# Patient Record
Sex: Male | Born: 1955
Health system: Southern US, Community
[De-identification: ages and names within clinical notes are randomized; demographics above are authoritative.]

## PROBLEM LIST (undated history)

## (undated) DIAGNOSIS — J189 Pneumonia, unspecified organism: Secondary | ICD-10-CM

## (undated) DIAGNOSIS — M109 Gout, unspecified: Secondary | ICD-10-CM

## (undated) DIAGNOSIS — M1990 Unspecified osteoarthritis, unspecified site: Secondary | ICD-10-CM

## (undated) DIAGNOSIS — D496 Neoplasm of unspecified behavior of brain: Secondary | ICD-10-CM

## (undated) DIAGNOSIS — R0600 Dyspnea, unspecified: Secondary | ICD-10-CM

## (undated) DIAGNOSIS — E119 Type 2 diabetes mellitus without complications: Secondary | ICD-10-CM

## (undated) DIAGNOSIS — I10 Essential (primary) hypertension: Secondary | ICD-10-CM

## (undated) DIAGNOSIS — J449 Chronic obstructive pulmonary disease, unspecified: Secondary | ICD-10-CM

## (undated) HISTORY — DX: Essential (primary) hypertension: I10

---

## 2000-01-27 ENCOUNTER — Emergency Department (HOSPITAL_COMMUNITY): Admission: EM | Admit: 2000-01-27 | Discharge: 2000-01-27 | Payer: Self-pay

## 2007-03-28 ENCOUNTER — Emergency Department (HOSPITAL_COMMUNITY): Admission: EM | Admit: 2007-03-28 | Discharge: 2007-03-28 | Payer: Self-pay | Admitting: Emergency Medicine

## 2009-06-04 ENCOUNTER — Encounter: Admission: RE | Admit: 2009-06-04 | Discharge: 2009-06-04 | Payer: Self-pay | Admitting: Family Medicine

## 2009-06-12 ENCOUNTER — Encounter: Admission: RE | Admit: 2009-06-12 | Discharge: 2009-06-12 | Payer: Self-pay | Admitting: Family Medicine

## 2009-08-21 ENCOUNTER — Ambulatory Visit (HOSPITAL_BASED_OUTPATIENT_CLINIC_OR_DEPARTMENT_OTHER): Admission: RE | Admit: 2009-08-21 | Discharge: 2009-08-21 | Payer: Self-pay | Admitting: Family Medicine

## 2009-08-24 ENCOUNTER — Ambulatory Visit: Payer: Self-pay | Admitting: Internal Medicine

## 2010-01-16 ENCOUNTER — Ambulatory Visit: Payer: Self-pay | Admitting: Psychology

## 2010-06-01 ENCOUNTER — Encounter: Payer: Self-pay | Admitting: Orthopedic Surgery

## 2010-10-03 ENCOUNTER — Ambulatory Visit (HOSPITAL_COMMUNITY)
Admission: RE | Admit: 2010-10-03 | Discharge: 2010-10-03 | Disposition: A | Discharge status: Home / Self Care | Payer: 59 | Source: Ambulatory Visit | Attending: Family Medicine | Admitting: Family Medicine

## 2010-10-03 DIAGNOSIS — R0602 Shortness of breath: Secondary | ICD-10-CM | POA: Insufficient documentation

## 2011-02-17 LAB — CBC
HCT: 46.4
Hemoglobin: 16
MCV: 95.8
RDW: 12.8
WBC: 7.8

## 2011-02-17 LAB — I-STAT 8, (EC8 V) (CONVERTED LAB)
Acid-Base Excess: 3 — ABNORMAL HIGH
BUN: 14
Bicarbonate: 29.4 — ABNORMAL HIGH
Chloride: 106
Glucose, Bld: 100 — ABNORMAL HIGH
HCT: 48
Hemoglobin: 16.3
Operator id: 151321
Potassium: 4.2
Sodium: 136
TCO2: 31
pCO2, Ven: 50.5 — ABNORMAL HIGH
pH, Ven: 7.372 — ABNORMAL HIGH

## 2011-02-17 LAB — DIFFERENTIAL
Basophils Absolute: 0
Basophils Relative: 0
Eosinophils Relative: 2
Monocytes Absolute: 0.8
Neutro Abs: 4.9

## 2011-02-17 LAB — POCT I-STAT CREATININE
Creatinine, Ser: 1
Operator id: 151321

## 2011-02-17 LAB — URINALYSIS, ROUTINE W REFLEX MICROSCOPIC
Bilirubin Urine: NEGATIVE
Glucose, UA: NEGATIVE
Ketones, ur: NEGATIVE
Specific Gravity, Urine: 1.014

## 2015-05-07 ENCOUNTER — Other Ambulatory Visit: Payer: Self-pay | Admitting: Family Medicine

## 2015-05-07 DIAGNOSIS — E291 Testicular hypofunction: Secondary | ICD-10-CM

## 2015-05-14 ENCOUNTER — Other Ambulatory Visit: Payer: Self-pay

## 2017-01-07 ENCOUNTER — Other Ambulatory Visit: Payer: Self-pay | Admitting: Family Medicine

## 2017-01-07 DIAGNOSIS — R945 Abnormal results of liver function studies: Principal | ICD-10-CM

## 2017-01-07 DIAGNOSIS — R7989 Other specified abnormal findings of blood chemistry: Secondary | ICD-10-CM

## 2017-01-19 ENCOUNTER — Ambulatory Visit
Admission: RE | Admit: 2017-01-19 | Discharge: 2017-01-19 | Disposition: A | Discharge status: Home / Self Care | Payer: Medicare Other | Source: Ambulatory Visit | Attending: Family Medicine | Admitting: Family Medicine

## 2017-01-19 DIAGNOSIS — R7989 Other specified abnormal findings of blood chemistry: Secondary | ICD-10-CM

## 2017-01-19 DIAGNOSIS — R945 Abnormal results of liver function studies: Principal | ICD-10-CM

## 2017-08-02 ENCOUNTER — Telehealth: Payer: Self-pay | Admitting: Emergency Medicine

## 2017-08-02 ENCOUNTER — Other Ambulatory Visit: Payer: Self-pay

## 2017-08-02 ENCOUNTER — Other Ambulatory Visit: Payer: Self-pay | Admitting: *Deleted

## 2017-08-02 ENCOUNTER — Ambulatory Visit
Admission: RE | Admit: 2017-08-02 | Discharge: 2017-08-02 | Disposition: A | Discharge status: Home / Self Care | Payer: Self-pay | Source: Ambulatory Visit | Attending: Internal Medicine | Admitting: Internal Medicine

## 2017-08-02 ENCOUNTER — Encounter: Payer: Self-pay | Admitting: Internal Medicine

## 2017-08-02 ENCOUNTER — Inpatient Hospital Stay: Payer: Medicare Other | Attending: Internal Medicine | Admitting: Internal Medicine

## 2017-08-02 VITALS — BP 139/49 | HR 58 | Temp 98.0°F | Resp 24 | Ht 75.0 in | Wt 263.0 lb

## 2017-08-02 DIAGNOSIS — D4989 Neoplasm of unspecified behavior of other specified sites: Secondary | ICD-10-CM

## 2017-08-02 DIAGNOSIS — R0600 Dyspnea, unspecified: Secondary | ICD-10-CM

## 2017-08-02 DIAGNOSIS — I10 Essential (primary) hypertension: Secondary | ICD-10-CM | POA: Insufficient documentation

## 2017-08-02 DIAGNOSIS — R4701 Aphasia: Secondary | ICD-10-CM

## 2017-08-02 DIAGNOSIS — R1084 Generalized abdominal pain: Secondary | ICD-10-CM

## 2017-08-02 DIAGNOSIS — C799 Secondary malignant neoplasm of unspecified site: Secondary | ICD-10-CM

## 2017-08-02 DIAGNOSIS — D496 Neoplasm of unspecified behavior of brain: Secondary | ICD-10-CM

## 2017-08-02 DIAGNOSIS — C8589 Other specified types of non-Hodgkin lymphoma, extranodal and solid organ sites: Secondary | ICD-10-CM | POA: Insufficient documentation

## 2017-08-02 DIAGNOSIS — R0602 Shortness of breath: Secondary | ICD-10-CM

## 2017-08-02 DIAGNOSIS — C801 Malignant (primary) neoplasm, unspecified: Secondary | ICD-10-CM

## 2017-08-02 DIAGNOSIS — K5909 Other constipation: Secondary | ICD-10-CM

## 2017-08-02 HISTORY — DX: Essential (primary) hypertension: I10

## 2017-08-02 MED ORDER — DEXAMETHASONE 4 MG PO TABS: 4.0000 mg | ORAL_TABLET | Freq: Every day | ORAL | 2 refills | Status: DC

## 2017-08-02 NOTE — Telephone Encounter (Signed)
lmtcb x1 for [place].

## 2017-08-02 NOTE — Telephone Encounter (Signed)
Spoke with Joelene Millin. She stated that the patient has a history of COPD and needs to establish with a pulmonary doctor ASAP. Patient was previously being managed by his PCP Dr. Arelia Sneddon, but a more aggressive approach needed to taken, especially since he has a brain tumor.   Rescheduled patient with MW for 08/24/17 at [place] verbalized understanding. She will also let the patient know as well.   Nothing else needed at time of call.

## 2017-08-02 NOTE — Progress Notes (Signed)
[facility] [place], [place] [address] [phone]   New Patient Evaluation  Date of Service: 08/02/17 Patient Name: Tim [name] Patient MRN: [id number] Patient DOB: Aug 11, 1955 Provider: Ventura Sellers, MD  Identifying Statement:  Tim [name] is a 62 y.o. male with multifocal brain tumor who presents for initial consultation and evaluation.    Referring Provider: Leonard Downing, MD [address] [place], [place] [address]  History of Present Illness: The patient's records from the referring physician were obtained and reviewed and the patient interviewed to confirm this HPI.  Tim [name] presented to medical attention this past week after noting 2 weeks of progressive language impairment.  He describes difficulty getting words out and communicating with his family.  Although he feels his comprehension is normal, he has been unable to read or write in the past week.  He also acknowledges clumsiness involving his right hand, difficulty manipulating a pen/pencil and some trouble using utensils.  This is new as of the past week.  He denies gait difficulty or weakness/dragging of the right leg.  He otherwise denies headaches, seizures.  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  He presents today to initiate workup and investigation into this new finding.  Of note, he describes a history of COPD requiring home O2.  However, no formal COPD medications are identified and oxygen is strictly used at night for sleep.    Medications: Current Outpatient Medications on File Prior to Visit  Medication Sig Dispense Refill  . allopurinol (ZYLOPRIM) 300 MG tablet Take 300 mg by mouth daily.    Marland Kitchen atenolol (TENORMIN) 50 MG tablet Take 50 mg by mouth daily.    Marland Kitchen b complex vitamins tablet Take 1 tablet by mouth daily.    Marland Kitchen lisinopril (PRINIVIL,ZESTRIL) 20 MG tablet Take  20 mg by mouth daily.    . Multiple Vitamin (MULTIVITAMIN) tablet Take 1 tablet by mouth daily.     No current facility-administered medications on file prior to visit.     Allergies: Allergies not on file Past Medical History:  Past Medical History:  Diagnosis Date  . Hypertension 08/02/2017   Past Surgical History: none    Social History:  Social History   Socioeconomic History  . Marital status: Legally Separated    Spouse name: Not on file  . Number of children: Not on file  . Years of education: Not on file  . Highest education level: Not on file  Occupational History  . Not on file  Social Needs  . Financial resource strain: Not on file  . Food insecurity:    Worry: Not on file    Inability: Not on file  . Transportation needs:    Medical: Not on file    Non-medical: Not on file  Tobacco Use  . Smoking status: Not on file  Substance and Sexual Activity  . Alcohol use: Not on file  . Drug use: Not on file  . Sexual activity: Not on file  Lifestyle  . Physical activity:    Days per week: Not on file    Minutes per session: Not on file  . Stress: Not on file  Relationships  . Social connections:    Talks on phone: Not on file    Gets together: Not on file    Attends religious service: Not on file    Active member of  club or organization: Not on file    Attends meetings of clubs or organizations: Not on file    Relationship status: Not on file  . Intimate partner violence:    Fear of current or ex partner: Not on file    Emotionally abused: Not on file    Physically abused: Not on file    Forced sexual activity: Not on file  Other Topics Concern  . Not on file  Social History Narrative  . Not on file   Family History: No family history on file.  Review of Systems: Constitutional: Denies fevers, chills or abnormal weight loss Eyes: Denies blurriness of vision Ears, nose, mouth, throat, and face: Denies mucositis or sore throat Respiratory: Denies  cough, dyspnea or wheezes Cardiovascular: Denies palpitation, chest discomfort or lower extremity swelling Gastrointestinal:  Denies nausea, constipation, diarrhea GU: Denies dysuria or incontinence Skin: Denies abnormal skin rashes Neurological: Per HPI Musculoskeletal: Denies joint pain, back or neck discomfort. No decrease in ROM Behavioral/Psych: Denies anxiety, disturbance in thought content, and mood instability  Physical Exam: Vitals:   08/02/17 1011  BP: (!) 139/49  Pulse: (!) 58  Resp: (!) 24  Temp: 98 F (36.7 C)  SpO2: 94%   KPS: 80. General: Alert, cooperative, pleasant, in no acute distress Head: Craniotomy scar noted, dry and intact. EENT: No conjunctival injection or scleral icterus. Oral mucosa moist Lungs: Resp effort normal Cardiac: Regular rate and rhythm Abdomen: Soft, non-distended abdomen Skin: No rashes cyanosis or petechiae. Extremities: No clubbing or edema  Neurologic Exam: Mental Status: Awake, alert, attentive to examiner. Oriented to self and environment. Language fluency is impaired, 2-3 words at a time at most.  Comprehension impaired to two step commands.  Repetition is preserved to medium length phrases.  Unable to read even simple sentences. +Dysgraphia Cranial Nerves: Visual acuity is grossly normal. Visual fields are full. Extra-ocular movements intact. No ptosis. Face is symmetric, tongue midline. Motor: Tone and bulk are normal. Pronator drift in right arm with impairment in distal fine motor movements. Reflexes are symmetric, no pathologic reflexes present. Intact finger to nose bilaterally Sensory: Intact to light touch and temperature Gait: Normal, independent   Labs: I have reviewed the data as listed    Component Value Date/Time   NA 136 03/28/2007 1546   K 4.2 03/28/2007 1546   CL 106 03/28/2007 1546   GLUCOSE 100 (H) 03/28/2007 1546   BUN 14 03/28/2007 1546   CREATININE 1.0 03/28/2007 1546   Lab Results  Component Value  Date   WBC 7.8 03/28/2007   NEUTROABS 4.9 03/28/2007   HGB 16.3 03/28/2007   HCT 48.0 03/28/2007   MCV 95.8 03/28/2007   PLT 166 03/28/2007    Imaging:   (MRI brain with and w/o contrast, external from [facility].  Pending upload to PACS system)  [facility] Clinician Interpretation: I have personally reviewed the CNS images as listed.  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  The pattern of enhancement and restricted diffusion decreases likelihood of more common gliomas.  This was discussed in detail with him and his significant other.    We will order CT chest/abdomen/pelvis for systemic screening in the event of a non-CNS primary lesion.  His case will be reviewed in multi-disciplinary brain tumor board on 08/04/17.  Will recommend referral to neurosurgery for likely brain biopsy.  Depending on pathology, further workup may include PET, ophthalmologic screening, and bone marrow biopsy.    Additionally, formal referral was placed to pulmonology to establish care for unclear respiratory diagnosis requiring HS home O2 (not CPAP).    Given his focality and functional impairment, we recommend he initiate therapy with dexamethasone 81m BID for 5 days, followed by 414mdaily thereafter.  He should call the clinic if symptoms do not improve with steroids.  We appreciate the opportunity to participate in the care of Tim [name]  We spent twenty additional minutes teaching regarding the natural history, biology, and historical experience in the treatment of brain tumors. We then discussed in detail the current recommendations for therapy focusing on the mode of administration,  mechanism of action, anticipated toxicities, and quality of life issues associated with this plan. We also provided teaching sheets for the patient to take home as an additional resource.  All questions were answered. The patient knows to call the clinic with any problems, questions or concerns. No barriers to learning were detected.  The total time spent in the encounter was 60 minutes and more than 50% was on counseling and review of test results   ZaVentura SellersMD Medical Director of Neuro-Oncology [facility] [place]3/25/19 10:01 AM

## 2017-08-02 NOTE — Telephone Encounter (Signed)
Tim [name] is calling back [phone]

## 2017-08-03 ENCOUNTER — Telehealth: Payer: Self-pay

## 2017-08-03 NOTE — Telephone Encounter (Signed)
Per 3/25 no los

## 2017-08-04 ENCOUNTER — Ambulatory Visit (HOSPITAL_COMMUNITY)
Admission: RE | Admit: 2017-08-04 | Discharge: 2017-08-04 | Disposition: A | Discharge status: Home / Self Care | Payer: Medicare Other | Source: Ambulatory Visit | Attending: Internal Medicine | Admitting: Internal Medicine

## 2017-08-04 ENCOUNTER — Ambulatory Visit (HOSPITAL_COMMUNITY): Payer: Medicare Other

## 2017-08-04 ENCOUNTER — Encounter (HOSPITAL_COMMUNITY): Payer: Self-pay

## 2017-08-04 DIAGNOSIS — I700 Atherosclerosis of aorta: Secondary | ICD-10-CM | POA: Insufficient documentation

## 2017-08-04 DIAGNOSIS — K5730 Diverticulosis of large intestine without perforation or abscess without bleeding: Secondary | ICD-10-CM | POA: Diagnosis not present

## 2017-08-04 DIAGNOSIS — R0602 Shortness of breath: Secondary | ICD-10-CM | POA: Diagnosis present

## 2017-08-04 DIAGNOSIS — R1084 Generalized abdominal pain: Secondary | ICD-10-CM

## 2017-08-04 MED ORDER — IOPAMIDOL (ISOVUE-300) INJECTION 61%
100.0000 mL | Freq: Once | INTRAVENOUS | Status: AC | PRN
  Administered 2017-08-04: 100 mL via INTRAVENOUS

## 2017-08-04 MED ORDER — IOPAMIDOL (ISOVUE-300) INJECTION 61%
INTRAVENOUS | Status: AC
  Filled 2017-08-04: qty 100

## 2017-08-10 ENCOUNTER — Other Ambulatory Visit (HOSPITAL_COMMUNITY): Payer: Self-pay | Admitting: Neurosurgery

## 2017-08-10 ENCOUNTER — Other Ambulatory Visit: Payer: Self-pay | Admitting: Neurosurgery

## 2017-08-10 DIAGNOSIS — D496 Neoplasm of unspecified behavior of brain: Secondary | ICD-10-CM

## 2017-08-16 ENCOUNTER — Ambulatory Visit (HOSPITAL_COMMUNITY)
Admission: RE | Admit: 2017-08-16 | Discharge: 2017-08-16 | Disposition: A | Discharge status: Home / Self Care | Payer: Medicare Other | Source: Ambulatory Visit | Attending: Neurosurgery | Admitting: Neurosurgery

## 2017-08-16 DIAGNOSIS — D496 Neoplasm of unspecified behavior of brain: Secondary | ICD-10-CM | POA: Diagnosis present

## 2017-08-16 LAB — CREATININE, SERUM
Creatinine, Ser: 0.98 mg/dL (ref 0.61–1.24)
GFR calc Af Amer: >60 mL/min (ref >60)
GFR calc non Af Amer: >60 mL/min (ref >60)

## 2017-08-16 MED ORDER — GADOBENATE DIMEGLUMINE 529 MG/ML IV SOLN
20.0000 mL | Freq: Once | INTRAVENOUS | Status: AC
  Administered 2017-08-16: 20 mL via INTRAVENOUS

## 2017-08-20 NOTE — Pre-Procedure Instructions (Signed)
Tim [name]  08/20/2017      [facility] ([facility] [place] [address]  ([place]) [place] [address] Phone: [phone] Fax: [phone]    Your procedure is scheduled on Mon. April 22  Report to [facility] Admitting at 1:35 P.M.  Call this number if you have problems the morning of surgery:  [phone]   Remember:  Do not eat food or drink liquids after midnight on Sun. April 21   Take these medicines the morning of surgery with A SIP OF WATER : albuterol inhaler-bring to hospital,allopurinol (zyloprim), atenolol (tenormin),decadron (dexamethasone)             7 days prior to surgery STOP taking any Aspirin(unless otherwise instructed by your surgeon), Aleve, Naproxen, Ibuprofen, Motrin, Advil, Goody's, BC's, all herbal medications, fish oil, and all vitamins   Do not wear jewelry.  Do not wear lotions, powders, or perfumes, or deodorant.  Do not shave 48 hours prior to surgery.  Men may shave face and neck.  Do not bring valuables to the hospital.  [facility] is not responsible for any belongings or valuables.  Contacts, dentures or bridgework may not be worn into surgery.  Leave your suitcase in the car.  After surgery it may be brought to your room.  For patients admitted to the hospital, discharge time will be determined by your treatment team.  Patients discharged the day of surgery will not be allowed to drive home.    Special instructions:  [facility]- Preparing For Surgery  Before surgery, you can play an important role. Because skin is not sterile, your skin needs to be as free of germs as possible. You can reduce the number of germs on your skin by washing with CHG (chlorahexidine gluconate) Soap before surgery.  CHG is an antiseptic cleaner which kills germs and bonds with the skin to continue killing germs even after washing.  Please do not use if you have an allergy to CHG or antibacterial  soaps. If your skin becomes reddened/irritated stop using the CHG.  Do not shave (including legs and underarms) for at least 48 hours prior to first CHG shower. It is OK to shave your face.  Please follow these instructions carefully.   1. Shower the NIGHT BEFORE SURGERY and the MORNING OF SURGERY with CHG.   2. If you chose to wash your hair, wash your hair first as usual with your normal shampoo.  3. After you shampoo, rinse your hair and body thoroughly to remove the shampoo.  4. Use CHG as you would any other liquid soap. You can apply CHG directly to the skin and wash gently with a scrungie or a clean washcloth.   5. Apply the CHG Soap to your body ONLY FROM THE NECK DOWN.  Do not use on open wounds or open sores. Avoid contact with your eyes, ears, mouth and genitals (private parts). Wash Face and genitals (private parts)  with your normal soap.  6. Wash thoroughly, paying special attention to the area where your surgery will be performed.  7. Thoroughly rinse your body with warm water from the neck down.  8. DO NOT shower/wash with your normal soap after using and rinsing off the CHG Soap.  9. Pat yourself dry with a CLEAN TOWEL.  10. Wear CLEAN PAJAMAS to bed the night before surgery, wear comfortable clothes the morning of surgery  11. Place CLEAN SHEETS on your  bed the night of your first shower and DO NOT SLEEP WITH PETS.    Day of Surgery: Do not apply any deodorants/lotions. Please wear clean clothes to the hospital/surgery center.      Please read over the following fact sheets that you were given. Coughing and Deep Breathing and Surgical Site Infection Prevention

## 2017-08-23 ENCOUNTER — Encounter (HOSPITAL_COMMUNITY)
Admission: RE | Admit: 2017-08-23 | Discharge: 2017-08-23 | Disposition: A | Discharge status: Home / Self Care | Payer: Medicare Other | Source: Ambulatory Visit | Attending: Neurosurgery | Admitting: Neurosurgery

## 2017-08-23 ENCOUNTER — Other Ambulatory Visit: Payer: Self-pay

## 2017-08-23 ENCOUNTER — Encounter (HOSPITAL_COMMUNITY): Payer: Self-pay

## 2017-08-23 DIAGNOSIS — I10 Essential (primary) hypertension: Secondary | ICD-10-CM | POA: Insufficient documentation

## 2017-08-23 DIAGNOSIS — E119 Type 2 diabetes mellitus without complications: Secondary | ICD-10-CM | POA: Insufficient documentation

## 2017-08-23 DIAGNOSIS — D496 Neoplasm of unspecified behavior of brain: Secondary | ICD-10-CM | POA: Diagnosis not present

## 2017-08-23 DIAGNOSIS — Z01812 Encounter for preprocedural laboratory examination: Secondary | ICD-10-CM | POA: Insufficient documentation

## 2017-08-23 DIAGNOSIS — M109 Gout, unspecified: Secondary | ICD-10-CM | POA: Insufficient documentation

## 2017-08-23 DIAGNOSIS — Z01810 Encounter for preprocedural cardiovascular examination: Secondary | ICD-10-CM | POA: Insufficient documentation

## 2017-08-23 DIAGNOSIS — R001 Bradycardia, unspecified: Secondary | ICD-10-CM | POA: Insufficient documentation

## 2017-08-23 DIAGNOSIS — J449 Chronic obstructive pulmonary disease, unspecified: Secondary | ICD-10-CM | POA: Insufficient documentation

## 2017-08-23 HISTORY — DX: Pneumonia, unspecified organism: J18.9

## 2017-08-23 HISTORY — DX: Gout, unspecified: M10.9

## 2017-08-23 HISTORY — DX: Dyspnea, unspecified: R06.00

## 2017-08-23 HISTORY — DX: Unspecified osteoarthritis, unspecified site: M19.90

## 2017-08-23 HISTORY — DX: Chronic obstructive pulmonary disease, unspecified: J44.9

## 2017-08-23 HISTORY — DX: Type 2 diabetes mellitus without complications: E11.9

## 2017-08-23 HISTORY — DX: Neoplasm of unspecified behavior of brain: D49.6

## 2017-08-23 LAB — CBC
HEMATOCRIT: 45.5 % (ref 39.0–52.0)
Hemoglobin: 16.1 g/dL (ref 13.0–17.0)
MCH: 32.4 pg (ref 26.0–34.0)
MCHC: 35.4 g/dL (ref 30.0–36.0)
MCV: 91.5 fL (ref 78.0–100.0)
PLATELETS: 140 10*3/uL — AB (ref 150–400)
RBC: 4.97 MIL/uL (ref 4.22–5.81)
RDW: 12.5 % (ref 11.5–15.5)
WBC: 17.8 10*3/uL — ABNORMAL HIGH (ref 4.0–10.5)

## 2017-08-23 LAB — BASIC METABOLIC PANEL
Anion gap: 9 (ref 5–15)
BUN: 14 mg/dL (ref 6–20)
CALCIUM: 8.7 mg/dL — AB (ref 8.9–10.3)
CO2: 26 mmol/L (ref 22–32)
CREATININE: 0.72 mg/dL (ref 0.61–1.24)
Chloride: 89 mmol/L — ABNORMAL LOW (ref 101–111)
Glucose, Bld: 137 mg/dL — ABNORMAL HIGH (ref 65–99)
Potassium: 4.2 mmol/L (ref 3.5–5.1)
SODIUM: 124 mmol/L — AB (ref 135–145)

## 2017-08-23 LAB — TYPE AND SCREEN
ABO/RH(D): O POS
ANTIBODY SCREEN: NEGATIVE

## 2017-08-23 LAB — ABO/RH: ABO/RH(D): O POS

## 2017-08-23 LAB — HEMOGLOBIN A1C
Hgb A1c MFr Bld: 7.1 % — ABNORMAL HIGH (ref 4.8–5.6)
Mean Plasma Glucose: 157.07 mg/dL

## 2017-08-23 LAB — GLUCOSE, CAPILLARY: GLUCOSE-CAPILLARY: 166 mg/dL — AB (ref 65–99)

## 2017-08-23 NOTE — Progress Notes (Signed)
PCP - Dr. Arelia Sneddon Cardiologist - patient denies  Chest x-ray - n/a EKG - 08/23/2017 Stress Test - patient denies ECHO - patient denies Cardiac Cath - patient denies  Sleep Study - 2011, in Epic under Media tab; patient wears supplemental O2 at night   Fasting Blood Sugar - ? Checks Blood Sugar 0 times a day Patient was recently taken off DM medications and is diet controlled.  Anesthesia review: n/a  Patient denies shortness of breath, fever, cough and chest pain at PAT appointment   Patient verbalized understanding of instructions that were given to them at the PAT appointment. Patient was also instructed that they will need to review over the PAT instructions again at home before surgery.

## 2017-08-23 NOTE — Progress Notes (Signed)
   08/23/17 1433  OBSTRUCTIVE SLEEP APNEA  Have you ever been diagnosed with sleep apnea through a sleep study? No (negative for sleep apnea but does have low O2 during sleep and wears supplemental O2 at night to bed)  Do you snore loudly (loud enough to be heard through closed doors)?  1  Do you often feel tired, fatigued, or sleepy during the daytime (such as falling asleep during driving or talking to someone)? 0  Has anyone observed you stop breathing during your sleep? 0  Do you have, or are you being treated for high blood pressure? 1  BMI more than 35 kg/m2? 0  Age > 50 (1-yes) 1  Neck circumference greater than:Male 16 inches or larger, Male 17inches or larger? 1  Male Gender (Yes=1) 1  Obstructive Sleep Apnea Score 5  Score 5 or greater  Results sent to PCP

## 2017-08-24 ENCOUNTER — Ambulatory Visit (INDEPENDENT_AMBULATORY_CARE_PROVIDER_SITE_OTHER): Payer: Medicare Other | Admitting: Internal Medicine

## 2017-08-24 ENCOUNTER — Encounter: Payer: Self-pay | Admitting: Internal Medicine

## 2017-08-24 VITALS — BP 140/82 | HR 68 | Ht 75.0 in | Wt 258.8 lb

## 2017-08-24 DIAGNOSIS — I10 Essential (primary) hypertension: Secondary | ICD-10-CM

## 2017-08-24 DIAGNOSIS — J449 Chronic obstructive pulmonary disease, unspecified: Secondary | ICD-10-CM

## 2017-08-24 DIAGNOSIS — R0609 Other forms of dyspnea: Secondary | ICD-10-CM | POA: Diagnosis not present

## 2017-08-24 MED ORDER — VALSARTAN 160 MG PO TABS: 160.0000 mg | ORAL_TABLET | Freq: Every day | ORAL | 11 refills | Status: DC

## 2017-08-24 MED ORDER — PANTOPRAZOLE SODIUM 40 MG PO TBEC: 40.0000 mg | DELAYED_RELEASE_TABLET | Freq: Every day | ORAL | 2 refills | Status: DC

## 2017-08-24 MED ORDER — FAMOTIDINE 20 MG PO TABS: ORAL_TABLET | ORAL | 11 refills | Status: DC

## 2017-08-24 NOTE — Progress Notes (Signed)
Subjective:     Patient ID: Tim [name], male   DOB: [birth year],     MRN: [id number]  HPI  41 yowm quit smoking 2013  With onset 2011 cough / shortness of breath placed on 02 2011 at hs (with reported  neg sleep study) and better p quit smoking > on just as needed saba and worse cough/sob again since March 2019 assoc hoarseness and aphasia > CT/ MRI ? Lymphoma and needs brain bx August 30 2017  So referred to pulmonary clinic 08/24/2017 by Dr   Mickeal Skinner   08/24/2017 1st [facility] office visit/ Mikell Camp   Chief Complaint  Patient presents with  . Pulm Consult    Referred by Dr. Mickeal Skinner for surgical clearance for brain tumor operation. Former smoker, quit 7 years ago. Was a smoker for 44 years. Was diagnosed with severe airways restriction. Uses  2L of O2.    doe :   p 10 min slow pace / steps also poorly tol due to sob and does one flight and stops at top now  Sleep on  side either one  No excess mucus   No better with saba    No obvious day to day or daytime variability or assoc excess/ purulent sputum or mucus plugs or hemoptysis or cp or chest tightness, subjective wheeze or overt sinus or hb symptoms. No unusual exposure hx or h/o childhood pna/ asthma or knowledge of premature birth.  Sleeping  On side  without nocturnal  or early am exacerbation  of respiratory  c/o's or need for noct saba. Also denies any obvious fluctuation of symptoms with weather or environmental changes or other aggravating or alleviating factors except as outlined above   Current Allergies, Complete Past Medical History, Past Surgical History, Family History, and Social History were reviewed in [employer] record.  ROS  The following are not active complaints unless bolded Hoarseness, sore throat, dysphagia, dental problems, itching, sneezing,  nasal congestion or discharge of excess mucus or purulent secretions, ear ache,   fever, chills, sweats, unintended wt loss or wt gain,  classically pleuritic or exertional cp,  orthopnea pnd or arm/hand swelling  or leg swelling, presyncope, palpitations, abdominal pain, anorexia, nausea, vomiting, diarrhea  or change in bowel habits or change in bladder habits, change in stools or change in urine, dysuria, hematuria,  rash, arthralgias, visual complaints, headache, numbness, weakness or ataxia or problems with walking or coordination,  change in mood or  memory.        Current Meds  Medication Sig  . albuterol (PROVENTIL HFA;VENTOLIN HFA) 108 (90 Base) MCG/ACT inhaler Inhale 2 puffs into the lungs every 4 (four) hours as needed for wheezing or shortness of breath.  . allopurinol (ZYLOPRIM) 300 MG tablet Take 300 mg by mouth daily.  Marland Kitchen atenolol (TENORMIN) 100 MG tablet Take 50 mg by mouth daily.  Marland Kitchen b complex vitamins tablet Take 1 tablet by mouth daily.  Marland Kitchen dexamethasone (DECADRON) 4 MG tablet Take 1 tablet (4 mg total) by mouth daily.  Marland Kitchen ibuprofen (ADVIL,MOTRIN) 200 MG tablet Take 800 mg by mouth daily as needed for moderate pain.  Marland Kitchen lisinopril (PRINIVIL,ZESTRIL) 20 MG tablet Take 10 mg by mouth daily.   . Multiple Vitamin (MULTIVITAMIN) tablet Take 1 tablet by mouth daily.             Review of Systems     Objective:   Physical Exam    Very hoarse wm nad   Wt  Readings from Last 3 Encounters:  08/24/17 258 lb 12.8 oz (117.4 kg)  08/23/17 261 lb 4.8 oz (118.5 kg)  08/02/17 263 lb (119.3 kg)     Vital signs reviewed - Note on arrival 02 sats  94% on RA      HEENT: nl dentition, turbinates bilaterally, - mild thrush  oropharynx. Nl external ear canals without cough reflex   NECK :  without JVD/Nodes/TM/ nl carotid upstrokes bilaterally   LUNGS: no acc muscle use,  Nl contour chest  With prominent pseudowheezing without cough on insp or exp maneuvers   CV:  RRR  no s3 or murmur or increase in P2, and trace bilateral sym pitting ankle edema   ABD:  Obese soft and nontender with limited inspiratory  excursion in the supine position. No bruits or organomegaly appreciated, bowel sounds nl  MS:  Nl gait/ ext warm without deformities, calf tenderness, cyanosis or clubbing No obvious joint restrictions   SKIN: warm and dry without lesions    NEURO:  alert, approp, nl sensorium with  no motor or cerebellar deficits apparent.      I personally reviewed images and agree with radiology impression as follows:   Chest CT w contrast 08/04/17 Cardiovascular: No significant vascular findings. Normal heart size. No pericardial effusion. Mediastinum/Nodes: No enlarged mediastinal, hilar, or axillary lymph nodes. Thyroid gland, trachea, and esophagus demonstrate no significant findings. Lungs/Pleura: Lungs are clear. No pleural effusion or pneumothorax.        Assessment:

## 2017-08-24 NOTE — Patient Instructions (Addendum)
Stop lisinopril immediately   Start diovan 160 mg one daily in place of lisinopril and call if problems acquiring it  Pantoprazole (protonix) 40 mg   Take  30-60 min before first meal of the day and Pepcid (famotidine)  20 mg one @  bedtime until return to office - this is the best way to tell whether stomach acid is contributing to your problem.    GERD (REFLUX)  is an extremely common cause of respiratory symptoms just like yours , many times with no obvious heartburn at all.    It can be treated with medication, but also with lifestyle changes including elevation of the head of your bed (ideally with 6 inch  bed blocks),  Smoking cessation, avoidance of late meals, excessive alcohol, and avoid fatty foods, chocolate, peppermint, colas, red wine, and acidic juices such as orange juice.  NO MINT OR MENTHOL PRODUCTS SO NO COUGH DROPS   USE SUGARLESS CANDY INSTEAD (Jolley ranchers or Stover's or Life Savers) or even ice chips will also do - the key is to swallow to prevent all throat clearing. NO OIL BASED VITAMINS - use powdered substitutes.    You are cleared for surgery    Please schedule a follow up office visit in 4 weeks, sooner if needed with full  pft's on return

## 2017-08-24 NOTE — Progress Notes (Signed)
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  WBC elevated, likely due to dexamethasone. Sodium/chloride low; Dr. Vertell Limber spoke with Dr. Lissa Hoard about hyponatremia.  Dr. Vertell Limber asks that pt be brought in to holding day of surgery 1-2 hours earlier than usual routine in order for Na to be rechecked and addressed if still low.   VS: BP (!) 153/66   Pulse (!) 55   Temp 36.7 C   Resp (!) 22   Ht 6\' 3"  (1.905 m)   Wt 261 lb 4.8 oz (118.5 kg)   SpO2 95%   BMI 32.66 kg/m    PROVIDERS: - PCP is Claris Gower, MD - Oncologist is Cecil Cobbs, MD  - Saw pulmonologist Christinia Gully, MD on 08/24/17 for pre-op eval. Note documents "Spirometry 08/24/2017  FEV1 1.68 (40%)  Ratio 53 with flattened early portion of f/v loop on acei > d/c'd  And max rec rx for gerd. F/v loop is typical of upper airway obst and ACEi adverse effects at the  top of the usual list of suspects and the only way to rule it out is a trial off > see hbp a/p  - also add max rx for gerd. If not better at f/u ov with pfts and has any change on inspiratory loop  next step may be ent eval to see if has VC paralysis but should not be a problem with surgery"    LABS:  - HbA1c 7.1, glucose 137 - Na 124, Cl 89 - WBC 17.8. 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 - Paucity of enhancement is seen on today's  examination compared to the previous study, likely related to steroid administration. See discussion above.  CT chest, abd, pelvis 08/04/17:  - No acute abnormality seen in the chest, abdomen or pelvis. - Sigmoid diverticulosis without inflammation. - Aortic Atherosclerosis   EKG 08/23/17: Sinus bradycardia (53 bpm)  Past Medical History:  Diagnosis Date  . Arthritis   . Brain tumor ([facility])   . COPD (chronic obstructive pulmonary disease) ([facility])   . Diabetes mellitus without complication [facility])    patient states he was taken off meds at last appointment with PCP and is diet controlled  . Dyspnea   . Gout   . Hypertension 08/02/2017  . Pneumonia     Medications include: Albuterol, atenolol, dexamethasone. Pt was on lisinopril, but this was discontinued 08/24/17 by Dr. Melvyn Novas.   If labs acceptable day of surgery, I anticipate pt can proceed with surgery as scheduled.   Willeen Cass, FNP-BC [facility]/Anesthesiology Phone: [phone] 08/25/2017 2:51 PM

## 2017-08-25 ENCOUNTER — Other Ambulatory Visit: Payer: Self-pay | Admitting: Neurosurgery

## 2017-08-25 ENCOUNTER — Telehealth: Payer: Self-pay | Admitting: Internal Medicine

## 2017-08-25 ENCOUNTER — Encounter: Payer: Self-pay | Admitting: Internal Medicine

## 2017-08-25 DIAGNOSIS — J449 Chronic obstructive pulmonary disease, unspecified: Secondary | ICD-10-CM | POA: Insufficient documentation

## 2017-08-25 NOTE — Assessment & Plan Note (Addendum)
Try off acei 08/24/2017 due to pseudowheeze  In the best review of chronic cough to date ( NEJM 2016 [id number]) ,  ACEi are now felt to cause cough in up to  20% of pts which is a 4 fold increase from previous reports and does not include the variety of non-specific complaints we see in pulmonary clinic in pts on ACEi but previously attributed to another dx like  Copd/asthma and  include PNDS, throat and chest congestion, "bronchitis", unexplained dyspnea and noct "strangling" sensations, and hoarseness, but also  atypical /refractory GERD symptoms like dysphagia and "bad heartburn"   The only way I know  to prove this is not an "ACEi Case" is a trial off ACEi x a minimum of 4-6 weeks then regroup - if not better consider In the setting of respiratory symptoms of unknown etiology,  It would be preferable to use bystolic, the most beta -1  selective Beta blocker available in sample form, with bisoprolol the most selective generic choice  on the market, at least on a trial basis, to make sure the spillover Beta 2 effects of the less specific Beta blockers are not contributing to this patient's symptoms.   Should not be a contraindication to surgery as will have been off a minimum of 3 days preop

## 2017-08-25 NOTE — Assessment & Plan Note (Signed)
Spirometry 08/24/2017  FEV1 1.68 (40%)  Ratio 53 with flattened early portion of f/v loop on acei > d/c'd  And max rec rx for gerd    F/v loop is typical of upper airway obst and ACEi adverse effects at the  top of the usual list of suspects and the only way to rule it out is a trial off > see hbp a/p  - also add max rx for gerd  If not better at f/u ov with pfts and has any change on inspiratory loop  next step may be ent eval to see if has VC paralysis but should not be a problem with surgery

## 2017-08-25 NOTE — Telephone Encounter (Signed)
Routing message to Dr. Melvyn Novas to address this afternoon when he comes in the office.

## 2017-08-25 NOTE — Assessment & Plan Note (Addendum)
  When respiratory symptoms begin or become refractory well after a patient reports complete smoking cessation,  Especially when this wasn't the case while they were smoking, a red flag is raised based on the work of Dr Kris Mouton which states:  if you quit smoking when your best day FEV1 is still well preserved it is highly unlikely you will progress to severe disease.  That is to say, once the smoking stops,  the symptoms should not suddenly erupt or markedly worsen.  If so, the differential diagnosis should include  obesity/deconditioning,  LPR/Reflux/Aspiration syndromes,  occult CHF, or  especially side effect of medications commonly used in this population (high dose beta blockers and ACEi)  so try off acei fist then bring back in 4 weeks for full pfts and consider alternative BB

## 2017-08-25 NOTE — Telephone Encounter (Signed)
Discussed with Dr Vertell Limber > na = 123   rec no hctz/ water restriction and repeat on 09/06/17 pre op

## 2017-08-28 NOTE — H&P (Signed)
Patient ID:   [id number] Patient: Tim [name]  Date of Birth: 1956-04-01 Visit Type: Office Visit   Date: 08/09/2017 03:00 PM Provider: Marchia Meiers. Vertell Limber MD   This 62 year old male presents for abnormal study.  HISTORY OF PRESENT ILLNESS:  1.  abnormal study  Tim [name], 62 year old male, visits for evaluation.  Disabled since 2011 due to issues with concentration, he suffered a loss of speech mid March while vacationing in [place].  He noticed some right arm weakness and right facial droop.  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  Initiation of Decadron twice daily, now once daily, significantly improved all symptoms.  History:  HTN, diet-controlled diabetes, COPD (oxygen at night only)-pulmonary consult scheduled for the 16th Surgical history:  None  MRI on [facility]  This shows multiple enhancing lesions within the left frontal lobe which come to the surface in the pre motor region and which are enhancing.  There also additional lesions in the periventricular region and within the posterior corpus callosum.  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  The patient has noticed problems with getting words out and also says he is unable to speak fluidly and uses the wrong terms at times.  He has gotten confused and frustrated with his lack of ability to get words out.  Since he started on steroids his right arm weakness and left facial droop have improved significantly.          PAST MEDICAL HISTORY, SURGICAL HISTORY, FAMILY HISTORY, SOCIAL HISTORY AND REVIEW OF SYSTEMS I have reviewed the patient's past medical, surgical, family and social history as well as the comprehensive review of systems as included on the [facility] history form dated 08/09/2017, which I have signed.  Family History: Patient reports there is no relevant family history.    MEDICATIONS: (added, continued or stopped this visit) Started  Medication Directions Instruction Stopped   allopurinol 300 mg tablet take 1 tablet by oral route  every day     atenolol 100 mg tablet take 1 tablet by oral route  every day     B-Complex tablet      dexamethasone 4 mg tablet take 1 tablet by oral route  every day     lisinopril 20 mg tablet take 1 tablet by oral route  every day     multivitamin tablet take 1 tablet by oral route  every day     Ventolin HFA 90 mcg/actuation aerosol inhaler inhale 2 puff by inhalation route  every 4 - 6 hours as needed       ALLERGIES: Ingredient Reaction Medication Name Comment  NO KNOWN ALLERGIES     No known allergies.   REVIEW OF SYSTEMS   See scanned patient registration form, dated 08/09/2017, signed and dated on 08/09/2017  Review of Systems Details System Neg/Pos Details  Constitutional Negative Chills, Fatigue, Fever, Malaise, Night sweats, Weight gain and Weight loss.  ENMT Negative Ear drainage, Hearing loss, Nasal drainage, Otalgia, Sinus pressure and Sore throat.  Eyes Negative Eye discharge, Eye pain and Vision changes.  Respiratory Negative Chronic cough, Cough, Dyspnea, Known TB exposure and Wheezing.  Cardio Negative Chest pain, Claudication, Edema and Irregular heartbeat/palpitations.  GI Negative Abdominal pain, Blood in stool, Change in stool pattern, Constipation, Decreased appetite, Diarrhea, Heartburn, Nausea and Vomiting.  GU Negative Dribbling, Dysuria, Erectile dysfunction, Hematuria, Polyuria (Genitourinary), Slow stream, Urinary frequency, Urinary incontinence and Urinary retention.  Endocrine Negative Cold intolerance, Heat intolerance,  Polydipsia and Polyphagia.  Neuro Positive Memory impairment.  Psych Negative Anxiety, Depression and Insomnia.  Integumentary Negative Brittle hair, Brittle nails, Change in shape/size of mole(s), Hair loss, Hirsutism, Hives, Pruritus, Rash and Skin lesion.  MS Negative Back pain, Joint pain, Joint swelling, Muscle weakness and Neck  pain.  Hema/Lymph Negative Easy bleeding, Easy bruising and Lymphadenopathy.  Allergic/Immuno Negative Contact allergy, Environmental allergies, Food allergies and Seasonal allergies.  Reproductive Negative Penile discharge and Sexual dysfunction.   PHYSICAL EXAM:   Vitals Date Temp F BP Pulse Ht In Wt Lb BMI BSA Pain Score  08/09/2017  145/81 54 75 265 33.12  0/10    PHYSICAL EXAM Details General Level of Distress: no acute distress Overall Appearance: normal  Head and Face  Right Left  Fundoscopic Exam:  normal normal    Cardiovascular Cardiac: regular rate and rhythm without murmur  Right Left  Carotid Pulses: normal normal  Respiratory Lungs: clear to auscultation  Neurological Orientation: normal Recent and Remote Memory: normal Attention Span and Concentration:   normal Language: normal Fund of Knowledge: normal  Right Left Sensation: normal normal Upper Extremity Coordination: normal normal  Lower Extremity Coordination: normal normal  Musculoskeletal Gait and Station: normal  Right Left Upper Extremity Muscle Strength: normal normal Lower Extremity Muscle Strength: normal normal Upper Extremity Muscle Tone:  normal normal Lower Extremity Muscle Tone: normal normal   Motor Strength Upper and lower extremity motor strength was tested in the clinically pertinent muscles.     Deep Tendon Reflexes  Right Left Biceps: normal normal Triceps: normal normal Brachioradialis: normal normal Patellar: normal normal Achilles: normal normal  Sensory Sensation was tested at L1 to S1.   Cranial Nerves II. Optic Nerve/Visual Fields: normal III. Oculomotor: normal IV. Trochlear: normal V. Trigeminal: normal VI. Abducens: normal VII. Facial: normal VIII. Acoustic/Vestibular: normal IX. Glossopharyngeal: normal X. Vagus: normal XI. Spinal Accessory: normal XII. Hypoglossal: normal  Motor and other  Tests Lhermittes: negative Rhomberg: negative Pronator drift: absent     Right Left Spurlings negative negative Hoffman's: normal normal Clonus: normal normal Babinski: normal normal SLR: negative negative Patrick's Corky Sox): negative negative Toe Walk: normal normal Toe Lift: normal normal Heel Walk: normal normal Tinels Elbow: negative negative Tinels Wrist: negative negative SI Joint: nontender nontender Phalen: negative negative   Additional Findings:  Minimal right facial droop and no pronator drift of outstretched upper extremities.  The patient has mild word-finding and naming difficulties.  He speaks more slowly than is typical for him.    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  We have recommended brain biopsy for this lesion.  We will perform a BrainLAB MRI preoperatively for left frontal craniotomy and biopsy.  PLAN:  The patient's brother died a few days ago and he is going to attend his brother's funeral.  We are electing to proceed with surgery after he has had a chance to be evaluated by his pulmonologist on April 16th to make sure it is safe for him to undergo general anesthesia and her tentatively planning to proceed with surgery on the 22nd of April.  Orders: Diagnostic Procedures: Assessment Procedure  D49.6 MRI Brain With & W/o Contrast BrainLAB protocol  Instruction(s)/Education: Assessment Instruction  I10 Hypertension education  [phone] Dietary management education, guidance, and counseling   Completed Orders (this encounter) Order Details Reason Side Interpretation Result Initial Treatment Date Region  Hypertension education Patient to follow up with primary care provider.  Dietary management education, guidance, and counseling patient encouraged to eat a well balanced diet         Assessment/Plan   # Detail Type Description   1. Assessment Brain tumor (D49.6).       2. Assessment Aphasia (R47.01).        3. 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       4. Assessment Essential (primary) hypertension (I10).       5. Assessment Body mass index (BMI) 33.0-33.9, adult (I45.80).   Plan Orders Today's instructions / counseling include(s) Dietary management education, guidance, and counseling.         Pain Management Plan Pain Scale: 0/10. Method: Numeric Pain Intensity Scale. Onset: 07/29/2017.              Provider:  Vertell Limber MD, Marchia Meiers 08/12/2017 5:38 PM  Dictation edited by: Marchia Meiers. Vertell Limber    CC Providers: [place], [place] [id number]-              Electronically signed by Marchia Meiers. Vertell Limber MD on 08/12/2017 05:38 PM

## 2017-08-30 ENCOUNTER — Other Ambulatory Visit: Payer: Self-pay

## 2017-08-30 ENCOUNTER — Inpatient Hospital Stay (HOSPITAL_COMMUNITY): Payer: Medicare Other | Admitting: Emergency Medicine

## 2017-08-30 ENCOUNTER — Encounter (HOSPITAL_COMMUNITY): Payer: Self-pay

## 2017-08-30 ENCOUNTER — Inpatient Hospital Stay (HOSPITAL_COMMUNITY): Payer: Medicare Other

## 2017-08-30 ENCOUNTER — Inpatient Hospital Stay (HOSPITAL_COMMUNITY)
Admission: RE | Disposition: A | Discharge status: Home / Self Care | Payer: Self-pay | Source: Ambulatory Visit | Attending: Neurosurgery

## 2017-08-30 ENCOUNTER — Inpatient Hospital Stay (HOSPITAL_COMMUNITY)
Admission: RE | Admit: 2017-08-30 | Discharge: 2017-08-31 | DRG: 821 | Disposition: A | Discharge status: Home / Self Care | Payer: Medicare Other | Source: Ambulatory Visit | Attending: Neurosurgery | Admitting: Neurosurgery

## 2017-08-30 DIAGNOSIS — C8589 Other specified types of non-Hodgkin lymphoma, extranodal and solid organ sites: Secondary | ICD-10-CM | POA: Diagnosis present

## 2017-08-30 DIAGNOSIS — R4701 Aphasia: Secondary | ICD-10-CM | POA: Diagnosis present

## 2017-08-30 DIAGNOSIS — I10 Essential (primary) hypertension: Secondary | ICD-10-CM | POA: Diagnosis present

## 2017-08-30 DIAGNOSIS — Z01818 Encounter for other preprocedural examination: Secondary | ICD-10-CM

## 2017-08-30 DIAGNOSIS — E119 Type 2 diabetes mellitus without complications: Secondary | ICD-10-CM | POA: Diagnosis present

## 2017-08-30 DIAGNOSIS — R0609 Other forms of dyspnea: Secondary | ICD-10-CM | POA: Diagnosis present

## 2017-08-30 DIAGNOSIS — Z79899 Other long term (current) drug therapy: Secondary | ICD-10-CM | POA: Diagnosis not present

## 2017-08-30 DIAGNOSIS — Z9981 Dependence on supplemental oxygen: Secondary | ICD-10-CM | POA: Diagnosis not present

## 2017-08-30 DIAGNOSIS — Z6833 Body mass index (BMI) 33.0-33.9, adult: Secondary | ICD-10-CM

## 2017-08-30 DIAGNOSIS — M1990 Unspecified osteoarthritis, unspecified site: Secondary | ICD-10-CM | POA: Diagnosis present

## 2017-08-30 DIAGNOSIS — R29810 Facial weakness: Secondary | ICD-10-CM | POA: Diagnosis present

## 2017-08-30 DIAGNOSIS — D496 Neoplasm of unspecified behavior of brain: Secondary | ICD-10-CM | POA: Diagnosis present

## 2017-08-30 DIAGNOSIS — Z87891 Personal history of nicotine dependence: Secondary | ICD-10-CM | POA: Diagnosis not present

## 2017-08-30 DIAGNOSIS — J449 Chronic obstructive pulmonary disease, unspecified: Secondary | ICD-10-CM | POA: Diagnosis present

## 2017-08-30 DIAGNOSIS — G8191 Hemiplegia, unspecified affecting right dominant side: Secondary | ICD-10-CM | POA: Diagnosis present

## 2017-08-30 HISTORY — PX: STERIOTACTIC STIMULATOR INSERTION: SHX5374

## 2017-08-30 HISTORY — PX: APPLICATION OF CRANIAL NAVIGATION: SHX6578

## 2017-08-30 LAB — POCT I-STAT 7, (LYTES, BLD GAS, ICA,H+H)
Acid-Base Excess: 4 mmol/L — ABNORMAL HIGH (ref 0.0–2.0)
Bicarbonate: 32.5 mmol/L — ABNORMAL HIGH (ref 20.0–28.0)
CALCIUM ION: 1.17 mmol/L (ref 1.15–1.40)
HEMATOCRIT: 41 % (ref 39.0–52.0)
Hemoglobin: 13.9 g/dL (ref 13.0–17.0)
O2 SAT: 99 %
PH ART: 7.315 — AB (ref 7.350–7.450)
POTASSIUM: 3.9 mmol/L (ref 3.5–5.1)
Sodium: 134 mmol/L — ABNORMAL LOW (ref 135–145)
TCO2: 34 mmol/L — AB (ref 22–32)
pCO2 arterial: 63.8 mmHg — ABNORMAL HIGH (ref 32.0–48.0)
pO2, Arterial: 174 mmHg — ABNORMAL HIGH (ref 83.0–108.0)

## 2017-08-30 LAB — POCT I-STAT 4, (NA,K, GLUC, HGB,HCT)
Glucose, Bld: 155 mg/dL — ABNORMAL HIGH (ref 65–99)
HCT: 46 % (ref 39.0–52.0)
Hemoglobin: 15.6 g/dL (ref 13.0–17.0)
Potassium: 4.1 mmol/L (ref 3.5–5.1)
SODIUM: 134 mmol/L — AB (ref 135–145)

## 2017-08-30 LAB — MRSA PCR SCREENING: MRSA BY PCR: NEGATIVE

## 2017-08-30 LAB — GLUCOSE, CAPILLARY
GLUCOSE-CAPILLARY: 300 mg/dL — AB (ref 65–99)
Glucose-Capillary: 159 mg/dL — ABNORMAL HIGH (ref 65–99)

## 2017-08-30 SURGERY — STERIOTACTIC BIOPSY
Anesthesia: General | Site: Head | Laterality: Left

## 2017-08-30 MED ORDER — PROPOFOL 10 MG/ML IV BOLUS
INTRAVENOUS | Status: DC | PRN
  Administered 2017-08-30: 130 mg via INTRAVENOUS

## 2017-08-30 MED ORDER — ALBUTEROL SULFATE (2.5 MG/3ML) 0.083% IN NEBU: 3.0000 mL | INHALATION_SOLUTION | RESPIRATORY_TRACT | Status: DC | PRN

## 2017-08-30 MED ORDER — ONDANSETRON HCL 4 MG/2ML IJ SOLN
INTRAMUSCULAR | Status: DC | PRN
  Administered 2017-08-30: 4 mg via INTRAVENOUS

## 2017-08-30 MED ORDER — BACITRACIN ZINC 500 UNIT/GM EX OINT
TOPICAL_OINTMENT | CUTANEOUS | Status: AC
  Filled 2017-08-30: qty 28.35

## 2017-08-30 MED ORDER — CEFAZOLIN SODIUM-DEXTROSE 2-4 GM/100ML-% IV SOLN
2.0000 g | Freq: Three times a day (TID) | INTRAVENOUS | Status: AC
  Administered 2017-08-30 – 2017-08-31 (×2): 2 g via INTRAVENOUS
  Filled 2017-08-30 (×2): qty 100

## 2017-08-30 MED ORDER — ACETAMINOPHEN 650 MG RE SUPP: 650.0000 mg | RECTAL | Status: DC | PRN

## 2017-08-30 MED ORDER — DOCUSATE SODIUM 100 MG PO CAPS
100.0000 mg | ORAL_CAPSULE | Freq: Two times a day (BID) | ORAL | Status: DC
Start: 2017-08-30 — End: 2017-08-31
  Administered 2017-08-30 – 2017-08-31 (×2): 100 mg via ORAL
  Filled 2017-08-30 (×2): qty 1

## 2017-08-30 MED ORDER — BUPIVACAINE HCL (PF) 0.5 % IJ SOLN
INTRAMUSCULAR | Status: DC | PRN
  Administered 2017-08-30: 3 mL

## 2017-08-30 MED ORDER — ROCURONIUM BROMIDE 100 MG/10ML IV SOLN
INTRAVENOUS | Status: DC | PRN
  Administered 2017-08-30: 60 mg via INTRAVENOUS

## 2017-08-30 MED ORDER — ADULT MULTIVITAMIN W/MINERALS CH
1.0000 | ORAL_TABLET | Freq: Every day | ORAL | Status: DC
  Administered 2017-08-30 – 2017-08-31 (×2): 1 via ORAL
  Filled 2017-08-30 (×2): qty 1

## 2017-08-30 MED ORDER — PHENYLEPHRINE HCL 10 MG/ML IJ SOLN
INTRAVENOUS | Status: DC | PRN
  Administered 2017-08-30: 20 ug/min via INTRAVENOUS

## 2017-08-30 MED ORDER — SUGAMMADEX SODIUM 200 MG/2ML IV SOLN
INTRAVENOUS | Status: DC | PRN
  Administered 2017-08-30: 250 mg via INTRAVENOUS

## 2017-08-30 MED ORDER — MORPHINE SULFATE (PF) 4 MG/ML IV SOLN: 2.0000 mg | INTRAVENOUS | Status: DC | PRN

## 2017-08-30 MED ORDER — PROPOFOL 10 MG/ML IV BOLUS
INTRAVENOUS | Status: AC
  Filled 2017-08-30: qty 20

## 2017-08-30 MED ORDER — FENTANYL CITRATE (PF) 100 MCG/2ML IJ SOLN
INTRAMUSCULAR | Status: DC | PRN
  Administered 2017-08-30: 100 ug via INTRAVENOUS

## 2017-08-30 MED ORDER — PANTOPRAZOLE SODIUM 40 MG PO TBEC: 40.0000 mg | DELAYED_RELEASE_TABLET | Freq: Every day | ORAL | Status: DC

## 2017-08-30 MED ORDER — ACETAMINOPHEN 500 MG PO TABS: 1000.0000 mg | ORAL_TABLET | ORAL | Status: DC | PRN

## 2017-08-30 MED ORDER — B COMPLEX PO TABS: 1.0000 | ORAL_TABLET | Freq: Every day | ORAL | Status: DC

## 2017-08-30 MED ORDER — ALLOPURINOL 300 MG PO TABS
300.0000 mg | ORAL_TABLET | Freq: Every day | ORAL | Status: DC
  Administered 2017-08-31: 300 mg via ORAL
  Filled 2017-08-30: qty 1

## 2017-08-30 MED ORDER — FLEET ENEMA 7-19 GM/118ML RE ENEM: 1.0000 | ENEMA | Freq: Once | RECTAL | Status: DC | PRN

## 2017-08-30 MED ORDER — ONDANSETRON HCL 4 MG/2ML IJ SOLN: 4.0000 mg | INTRAMUSCULAR | Status: DC | PRN

## 2017-08-30 MED ORDER — FAMOTIDINE IN NACL 20-0.9 MG/50ML-% IV SOLN
20.0000 mg | Freq: Two times a day (BID) | INTRAVENOUS | Status: DC
Start: 2017-08-30 — End: 2017-08-31
  Administered 2017-08-30 – 2017-08-31 (×2): 20 mg via INTRAVENOUS
  Filled 2017-08-30 (×2): qty 50

## 2017-08-30 MED ORDER — LIDOCAINE-EPINEPHRINE 1 %-1:100000 IJ SOLN
INTRAMUSCULAR | Status: AC
  Filled 2017-08-30: qty 1

## 2017-08-30 MED ORDER — CEFAZOLIN SODIUM-DEXTROSE 2-4 GM/100ML-% IV SOLN
2.0000 g | INTRAVENOUS | Status: AC
  Administered 2017-08-30: 2 g via INTRAVENOUS

## 2017-08-30 MED ORDER — PROMETHAZINE HCL 25 MG PO TABS: 12.5000 mg | ORAL_TABLET | ORAL | Status: DC | PRN

## 2017-08-30 MED ORDER — B COMPLEX-C PO TABS
1.0000 | ORAL_TABLET | Freq: Every day | ORAL | Status: DC
  Administered 2017-08-31: 1 via ORAL
  Filled 2017-08-30: qty 1

## 2017-08-30 MED ORDER — FENTANYL CITRATE (PF) 250 MCG/5ML IJ SOLN
INTRAMUSCULAR | Status: AC
  Filled 2017-08-30: qty 5

## 2017-08-30 MED ORDER — HEMOSTATIC AGENTS (NO CHARGE) OPTIME
TOPICAL | Status: DC | PRN
  Administered 2017-08-30: 1 via TOPICAL

## 2017-08-30 MED ORDER — BACITRACIN ZINC 500 UNIT/GM EX OINT
TOPICAL_OINTMENT | CUTANEOUS | Status: DC | PRN
  Administered 2017-08-30 (×2): 1 via TOPICAL

## 2017-08-30 MED ORDER — THROMBIN (RECOMBINANT) 5000 UNITS EX SOLR
OROMUCOSAL | Status: DC | PRN
  Administered 2017-08-30: 16:00:00 via TOPICAL

## 2017-08-30 MED ORDER — ALBUTEROL SULFATE HFA 108 (90 BASE) MCG/ACT IN AERS
INHALATION_SPRAY | RESPIRATORY_TRACT | Status: DC | PRN
  Administered 2017-08-30: 4 via RESPIRATORY_TRACT
  Administered 2017-08-30: 2 via RESPIRATORY_TRACT

## 2017-08-30 MED ORDER — DEXAMETHASONE 4 MG PO TABS
4.0000 mg | ORAL_TABLET | Freq: Every day | ORAL | Status: DC
  Administered 2017-08-30 – 2017-08-31 (×2): 4 mg via ORAL
  Filled 2017-08-30 (×2): qty 1

## 2017-08-30 MED ORDER — THROMBIN 5000 UNITS EX SOLR
CUTANEOUS | Status: AC
  Filled 2017-08-30: qty 15000

## 2017-08-30 MED ORDER — MIDAZOLAM HCL 2 MG/2ML IJ SOLN
INTRAMUSCULAR | Status: AC
  Filled 2017-08-30: qty 2

## 2017-08-30 MED ORDER — LIDOCAINE HCL (CARDIAC) PF 100 MG/5ML IV SOSY
PREFILLED_SYRINGE | INTRAVENOUS | Status: DC | PRN
  Administered 2017-08-30: 60 mg via INTRAVENOUS

## 2017-08-30 MED ORDER — LABETALOL HCL 5 MG/ML IV SOLN
10.0000 mg | INTRAVENOUS | Status: DC | PRN
  Administered 2017-08-30: 10 mg via INTRAVENOUS
  Filled 2017-08-30: qty 4

## 2017-08-30 MED ORDER — 0.9 % SODIUM CHLORIDE (POUR BTL) OPTIME
TOPICAL | Status: DC | PRN
  Administered 2017-08-30 (×3): 1000 mL

## 2017-08-30 MED ORDER — FAMOTIDINE 20 MG PO TABS: 20.0000 mg | ORAL_TABLET | Freq: Two times a day (BID) | ORAL | Status: DC

## 2017-08-30 MED ORDER — ACETAMINOPHEN 325 MG PO TABS
650.0000 mg | ORAL_TABLET | ORAL | Status: DC | PRN
  Filled 2017-08-30: qty 2

## 2017-08-30 MED ORDER — CHLORHEXIDINE GLUCONATE CLOTH 2 % EX PADS: 6.0000 | MEDICATED_PAD | Freq: Once | CUTANEOUS | Status: DC

## 2017-08-30 MED ORDER — DEXAMETHASONE SODIUM PHOSPHATE 4 MG/ML IJ SOLN
INTRAMUSCULAR | Status: DC | PRN
  Administered 2017-08-30: 10 mg via INTRAVENOUS

## 2017-08-30 MED ORDER — MIDAZOLAM HCL 5 MG/5ML IJ SOLN
INTRAMUSCULAR | Status: DC | PRN
  Administered 2017-08-30: 2 mg via INTRAVENOUS

## 2017-08-30 MED ORDER — POTASSIUM CHLORIDE IN NACL 20-0.9 MEQ/L-% IV SOLN
INTRAVENOUS | Status: DC
Start: 2017-08-30 — End: 2017-08-31
  Administered 2017-08-30: 20:00:00 via INTRAVENOUS
  Filled 2017-08-30 (×2): qty 1000

## 2017-08-30 MED ORDER — POLYETHYLENE GLYCOL 3350 17 G PO PACK: 17.0000 g | PACK | Freq: Every day | ORAL | Status: DC | PRN

## 2017-08-30 MED ORDER — HYDROCODONE-ACETAMINOPHEN 5-325 MG PO TABS
1.0000 | ORAL_TABLET | ORAL | Status: DC | PRN
  Administered 2017-08-30 – 2017-08-31 (×4): 1 via ORAL
  Filled 2017-08-30 (×4): qty 1

## 2017-08-30 MED ORDER — FENTANYL CITRATE (PF) 100 MCG/2ML IJ SOLN
25.0000 ug | INTRAMUSCULAR | Status: DC | PRN
  Administered 2017-08-30 (×2): 25 ug via INTRAVENOUS
  Administered 2017-08-30: 50 ug via INTRAVENOUS

## 2017-08-30 MED ORDER — BISACODYL 10 MG RE SUPP: 10.0000 mg | Freq: Every day | RECTAL | Status: DC | PRN

## 2017-08-30 MED ORDER — FENTANYL CITRATE (PF) 100 MCG/2ML IJ SOLN
INTRAMUSCULAR | Status: AC
  Filled 2017-08-30: qty 2

## 2017-08-30 MED ORDER — LIDOCAINE-EPINEPHRINE 1 %-1:100000 IJ SOLN
INTRAMUSCULAR | Status: DC | PRN
  Administered 2017-08-30: 3 mL

## 2017-08-30 MED ORDER — INSULIN ASPART 100 UNIT/ML ~~LOC~~ SOLN
0.0000 [IU] | Freq: Every day | SUBCUTANEOUS | Status: DC
  Administered 2017-08-30: 3 [IU] via SUBCUTANEOUS

## 2017-08-30 MED ORDER — IRBESARTAN 150 MG PO TABS
150.0000 mg | ORAL_TABLET | Freq: Every day | ORAL | Status: DC
  Administered 2017-08-31: 150 mg via ORAL
  Filled 2017-08-30: qty 1

## 2017-08-30 MED ORDER — ATENOLOL 25 MG PO TABS
50.0000 mg | ORAL_TABLET | Freq: Every day | ORAL | Status: DC
  Filled 2017-08-30: qty 2

## 2017-08-30 MED ORDER — ONDANSETRON HCL 4 MG PO TABS: 4.0000 mg | ORAL_TABLET | ORAL | Status: DC | PRN

## 2017-08-30 MED ORDER — INSULIN ASPART 100 UNIT/ML ~~LOC~~ SOLN
0.0000 [IU] | Freq: Three times a day (TID) | SUBCUTANEOUS | Status: DC
  Administered 2017-08-31: 3 [IU] via SUBCUTANEOUS

## 2017-08-30 MED ORDER — PHENYLEPHRINE HCL 10 MG/ML IJ SOLN
INTRAMUSCULAR | Status: DC | PRN
  Administered 2017-08-30: 80 ug via INTRAVENOUS

## 2017-08-30 MED ORDER — SODIUM CHLORIDE 0.9 % IV SOLN
INTRAVENOUS | Status: DC | PRN
  Administered 2017-08-30: 15:00:00 via INTRAVENOUS

## 2017-08-30 MED ORDER — LEVETIRACETAM IN NACL 500 MG/100ML IV SOLN
500.0000 mg | Freq: Two times a day (BID) | INTRAVENOUS | Status: DC
  Administered 2017-08-30: 500 mg via INTRAVENOUS
  Filled 2017-08-30 (×2): qty 100

## 2017-08-30 MED ORDER — BUPIVACAINE HCL (PF) 0.5 % IJ SOLN
INTRAMUSCULAR | Status: AC
  Filled 2017-08-30: qty 30

## 2017-08-30 SURGICAL SUPPLY — 78 items
BIT DRILL WIRE PASS 1.3MM (BIT) IMPLANT
BLADE CLIPPER SURG (BLADE) ×3 IMPLANT
BNDG STRETCH 4X75 STRL LF (GAUZE/BANDAGES/DRESSINGS) IMPLANT
BUR ACORN 6.0 PRECISION (BURR) ×3 IMPLANT
BUR SPIRAL ROUTER 2.3 (BUR) IMPLANT
CANISTER SUCT 3000ML PPV (MISCELLANEOUS) ×6 IMPLANT
CARTRIDGE OIL MAESTRO DRILL (MISCELLANEOUS) ×2 IMPLANT
CLIP VESOCCLUDE MED 6/CT (CLIP) IMPLANT
CONT SPEC 4OZ CLIKSEAL STRL BL (MISCELLANEOUS) ×6 IMPLANT
COVER MAYO STAND STRL (DRAPES) IMPLANT
DECANTER SPIKE VIAL GLASS SM (MISCELLANEOUS) ×3 IMPLANT
DIFFUSER DRILL AIR PNEUMATIC (MISCELLANEOUS) ×3 IMPLANT
DRAPE MICROSCOPE LEICA (MISCELLANEOUS) IMPLANT
DRAPE NEUROLOGICAL W/INCISE (DRAPES) ×3 IMPLANT
DRAPE STERI IOBAN 125X83 (DRAPES) IMPLANT
DRAPE WARM FLUID 44X44 (DRAPE) ×3 IMPLANT
DRILL WIRE PASS 1.3MM (BIT)
DRSG OPSITE POSTOP 3X4 (GAUZE/BANDAGES/DRESSINGS) ×3 IMPLANT
DURAPREP 6ML APPLICATOR 50/CS (WOUND CARE) ×3 IMPLANT
ELECT REM PT RETURN 9FT ADLT (ELECTROSURGICAL) ×3
ELECTRODE REM PT RTRN 9FT ADLT (ELECTROSURGICAL) ×2 IMPLANT
EVACUATOR 1/8 PVC DRAIN (DRAIN) IMPLANT
EVACUATOR SILICONE 100CC (DRAIN) IMPLANT
GAUZE SPONGE 4X4 12PLY STRL (GAUZE/BANDAGES/DRESSINGS) IMPLANT
GAUZE SPONGE 4X4 16PLY XRAY LF (GAUZE/BANDAGES/DRESSINGS) IMPLANT
GLOVE BIO SURGEON STRL SZ8 (GLOVE) ×12 IMPLANT
GLOVE BIOGEL PI IND STRL 7.0 (GLOVE) ×2 IMPLANT
GLOVE BIOGEL PI IND STRL 8 (GLOVE) ×10 IMPLANT
GLOVE BIOGEL PI IND STRL 8.5 (GLOVE) ×2 IMPLANT
GLOVE BIOGEL PI INDICATOR 7.0 (GLOVE) ×1
GLOVE BIOGEL PI INDICATOR 8 (GLOVE) ×5
GLOVE BIOGEL PI INDICATOR 8.5 (GLOVE) ×1
GLOVE ECLIPSE 8.0 STRL XLNG CF (GLOVE) ×6 IMPLANT
GLOVE EXAM NITRILE LRG STRL (GLOVE) IMPLANT
GLOVE EXAM NITRILE XL STR (GLOVE) IMPLANT
GLOVE EXAM NITRILE XS STR PU (GLOVE) IMPLANT
GLOVE INDICATOR 8.5 STRL (GLOVE) ×3 IMPLANT
GOWN STRL REUS W/ TWL LRG LVL3 (GOWN DISPOSABLE) IMPLANT
GOWN STRL REUS W/ TWL XL LVL3 (GOWN DISPOSABLE) ×2 IMPLANT
GOWN STRL REUS W/TWL 2XL LVL3 (GOWN DISPOSABLE) ×9 IMPLANT
GOWN STRL REUS W/TWL LRG LVL3 (GOWN DISPOSABLE)
GOWN STRL REUS W/TWL XL LVL3 (GOWN DISPOSABLE) ×1
HEMOSTAT POWDER KIT SURGIFOAM (HEMOSTASIS) ×3 IMPLANT
HEMOSTAT SURGICEL 2X14 (HEMOSTASIS) ×3 IMPLANT
KIT BASIN OR (CUSTOM PROCEDURE TRAY) ×3 IMPLANT
KIT TURNOVER KIT B (KITS) ×3 IMPLANT
MARKER SKIN DUAL TIP RULER LAB (MISCELLANEOUS) ×3 IMPLANT
MARKER SPHERE PSV REFLC 13MM (MARKER) ×9 IMPLANT
NEEDLE BIOPSY DISPOS 1.8X150MM (NEEDLE) ×3 IMPLANT
NEEDLE HYPO 25X1 1.5 SAFETY (NEEDLE) ×3 IMPLANT
NS IRRIG 1000ML POUR BTL (IV SOLUTION) ×6 IMPLANT
OIL CARTRIDGE MAESTRO DRILL (MISCELLANEOUS) ×3
PACK CRANIOTOMY CUSTOM (CUSTOM PROCEDURE TRAY) ×3 IMPLANT
PAD ARMBOARD 7.5X6 YLW CONV (MISCELLANEOUS) ×9 IMPLANT
PATTIES SURGICAL .25X.25 (GAUZE/BANDAGES/DRESSINGS) IMPLANT
PATTIES SURGICAL .5 X.5 (GAUZE/BANDAGES/DRESSINGS) IMPLANT
PATTIES SURGICAL .5 X3 (DISPOSABLE) IMPLANT
PATTIES SURGICAL 1/4 X 3 (GAUZE/BANDAGES/DRESSINGS) IMPLANT
PATTIES SURGICAL 1X1 (DISPOSABLE) IMPLANT
PIN MAYFIELD SKULL DISP (PIN) ×3 IMPLANT
PLATE 1.5/0.5 13MM BURR HOLE (Plate) ×3 IMPLANT
RUBBERBAND STERILE (MISCELLANEOUS) IMPLANT
SCREW SELF DRILL HT 1.5/4MM (Screw) ×9 IMPLANT
SPECIMEN JAR SMALL (MISCELLANEOUS) IMPLANT
SPONGE NEURO XRAY DETECT 1X3 (DISPOSABLE) IMPLANT
SPONGE SURGIFOAM ABS GEL 100 (HEMOSTASIS) IMPLANT
STAPLER SKIN PROX WIDE 3.9 (STAPLE) ×3 IMPLANT
SUT ETHILON 3 0 FSL (SUTURE) IMPLANT
SUT NURALON 4 0 TR CR/8 (SUTURE) ×3 IMPLANT
SUT SILK 2 0 PERMA HAND 18 BK (SUTURE) IMPLANT
SUT VIC AB 2-0 CP2 18 (SUTURE) ×3 IMPLANT
SYR CONTROL 10ML LL (SYRINGE) ×3 IMPLANT
TOWEL GREEN STERILE (TOWEL DISPOSABLE) ×3 IMPLANT
TOWEL GREEN STERILE FF (TOWEL DISPOSABLE) ×3 IMPLANT
TRAY FOLEY W/METER SILVER 16FR (SET/KITS/TRAYS/PACK) IMPLANT
TUBE CONNECTING 12X1/4 (SUCTIONS) IMPLANT
UNDERPAD 30X30 (UNDERPADS AND DIAPERS) IMPLANT
WATER STERILE IRR 1000ML POUR (IV SOLUTION) ×3 IMPLANT

## 2017-08-30 NOTE — Brief Op Note (Signed)
08/30/2017  3:58 PM  PATIENT:  Tim [name]  62 y.o. 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  PROCEDURE:  Procedure(s) with comments: LEFT Sterotactic BIOPSY FRONTAL TUMOR (Left) - LEFT BIOPSY FRONTAL TUMOR APPLICATION OF CRANIAL NAVIGATION (Left)  SURGEON:  Surgeon(s) and Role:    Erline Levine, MD - Primary  PHYSICIAN ASSISTANT:   ASSISTANTS: Poteat, RN   ANESTHESIA:   general  EBL: Minimal  BLOOD ADMINISTERED:none  DRAINS: none   LOCAL MEDICATIONS USED:  MARCAINE    and LIDOCAINE   SPECIMEN:  Biopsy / Limited Resection  DISPOSITION OF SPECIMEN:  PATHOLOGY  COUNTS:  YES  TOURNIQUET:  * No tourniquets in log *  DICTATION: DICTATION: Patient is 62 year old man with newly diagnosed brain tumor. He presented with aphasia and hemiparesis.  It was elected to take him to surgery for stereotactic biopsy of left frontal l brain tumor.  He had preop MRI for use of Curve for surgical localization of tumor.  Procedure:  Following smooth intubation, patient was placed in supine position.  Head was placed in pins and left frontal scalp was shaved and prepped and draped in usual sterile fashion after Curve MRI was localized to map tumor location.  Area of planned incision was infiltrated with lidocaine. A linear parasagittal incision was made and carried to expose calvarium.  Brainlab Varioguide was used to biopsy the T 2 signal abnormal brain tissue from three locations along a planned trajectory. High speed drill was used to produce a bur hole exposing the dura directly overlying the brain mass.  Dura was opened. Multiple biopsies of edematous appearing white matter were obtained and sent to Pathology for permanent analysis.  Hemostasis was assured.  A cranial plate bur hole cover was placed over the bur hole.  The fascia and galea were closed with 2-0 vicryl sutures and the skin was re  approximated with staples.  A sterile occlusive dressing was placed.  Patient was returned to a supine position and taken out of head pins, then extubated in the operating room, having tolerated surgery well.  Counts were correct at the end of the case.  PLAN OF CARE: Admit to inpatient   PATIENT DISPOSITION:  PACU - hemodynamically stable.   Delay start of Pharmacological VTE agent (>24hrs) due to surgical blood loss or risk of bleeding: yes

## 2017-08-30 NOTE — Transfer of Care (Signed)
Immediate Anesthesia Transfer of Care Note  Patient: Tim [name]  Procedure(s) Performed: LEFT Frontal Sterotactic Brain Biopsy with BrainLab (Left Head) APPLICATION OF CRANIAL NAVIGATION (Left Head)  Patient Location: PACU  Anesthesia Type:General  Level of Consciousness: awake, alert , oriented and patient cooperative  Airway & Oxygen Therapy: Patient Spontanous Breathing and Patient connected to nasal cannula oxygen  Post-op Assessment: Report given to RN and Post -op Vital signs reviewed and stable  Post vital signs: Reviewed and stable  Last Vitals:  Vitals Value Taken Time  BP 154/70 08/30/2017  4:43 PM  Temp 36.9 C 08/30/2017  4:43 PM  Pulse 60 08/30/2017  4:48 PM  Resp 15 08/30/2017  4:48 PM  SpO2 93 % 08/30/2017  4:48 PM  Vitals shown include unvalidated device data.  Last Pain:  Vitals:   08/30/17 1643  TempSrc:   PainSc: 0-No pain      Patients Stated Pain Goal: 5 (97/47/18 5501)  Complications: No apparent anesthesia complications

## 2017-08-30 NOTE — Progress Notes (Signed)
Awake, alert, conversant.  MAEW.  No numbness or weakness. Speech unchanged.  Doing well.

## 2017-08-30 NOTE — Op Note (Signed)
08/30/2017  3:58 PM  PATIENT:  Tim [name]  62 y.o. 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  PROCEDURE:  Procedure(s) with comments: LEFT Sterotactic BIOPSY FRONTAL TUMOR (Left) - LEFT BIOPSY FRONTAL TUMOR APPLICATION OF CRANIAL NAVIGATION (Left)  SURGEON:  Surgeon(s) and Role:    Erline Levine, MD - Primary  PHYSICIAN ASSISTANT:   ASSISTANTS: Poteat, RN   ANESTHESIA:   general  EBL: Minimal  BLOOD ADMINISTERED:none  DRAINS: none   LOCAL MEDICATIONS USED:  MARCAINE    and LIDOCAINE   SPECIMEN:  Biopsy / Limited Resection  DISPOSITION OF SPECIMEN:  PATHOLOGY  COUNTS:  YES  TOURNIQUET:  * No tourniquets in log *  DICTATION: DICTATION: Patient is 62 year old man with newly diagnosed brain tumor. He presented with aphasia and hemiparesis.  It was elected to take him to surgery for stereotactic biopsy of left frontal l brain tumor.  He had preop MRI for use of Curve for surgical localization of tumor.  Procedure:  Following smooth intubation, patient was placed in supine position.  Head was placed in pins and left frontal scalp was shaved and prepped and draped in usual sterile fashion after Curve MRI was localized to map tumor location.  Area of planned incision was infiltrated with lidocaine. A linear parasagittal incision was made and carried to expose calvarium.  Brainlab Varioguide was used to biopsy the T 2 signal abnormal brain tissue from three locations along a planned trajectory. High speed drill was used to produce a bur hole exposing the dura directly overlying the brain mass.  Dura was opened. Multiple biopsies of edematous appearing white matter were obtained and sent to Pathology for permanent analysis.  Hemostasis was assured.  A cranial plate bur hole cover was placed over the bur hole.  The fascia and galea were closed with 2-0 vicryl sutures and the skin was re  approximated with staples.  A sterile occlusive dressing was placed.  Patient was returned to a supine position and taken out of head pins, then extubated in the operating room, having tolerated surgery well.  Counts were correct at the end of the case.  PLAN OF CARE: Admit to inpatient   PATIENT DISPOSITION:  PACU - hemodynamically stable.   Delay start of Pharmacological VTE agent (>24hrs) due to surgical blood loss or risk of bleeding: yes

## 2017-08-30 NOTE — Interval H&P Note (Signed)
History and Physical Interval Note:  08/30/2017 1:09 PM  Tim [name]  has presented today for surgery, with the diagnosis of BRAIN TUMOR  The various methods of treatment have been discussed with the patient and family. After consideration of risks, benefits and other options for treatment, the patient has consented to  Procedure(s) with comments: LEFT Sterotactic CRANIOTOMY FOR BIOPSY FRONTAL TUMOR (Left) - LEFT CRANIOTOMY FOR BIOPSY FRONTAL TUMOR APPLICATION OF CRANIAL NAVIGATION (Left) as a surgical intervention .  The patient's history has been reviewed, patient examined, no change in status, stable for surgery.  I have reviewed the patient's chart and labs.  Questions were answered to the patient's satisfaction.     Stachia Slutsky D

## 2017-08-30 NOTE — Anesthesia Postprocedure Evaluation (Signed)
Anesthesia Post Note  Patient: Tim [name]  Procedure(s) Performed: LEFT Frontal Sterotactic Brain Biopsy with BrainLab (Left Head) APPLICATION OF CRANIAL NAVIGATION (Left Head)     Patient location during evaluation: PACU Anesthesia Type: General Level of consciousness: awake Pain management: pain level controlled Vital Signs Assessment: post-procedure vital signs reviewed and stable Respiratory status: spontaneous breathing Cardiovascular status: stable Anesthetic complications: no    Last Vitals:  Vitals:   08/30/17 1743 08/30/17 1810  BP: (!) 147/84   Pulse: (!) 59 61  Resp: 19 18  Temp:    SpO2: 93% 93%    Last Pain:  Vitals:   08/30/17 1810  TempSrc:   PainSc: 0-No pain                 Dartha Rozzell

## 2017-08-30 NOTE — Anesthesia Procedure Notes (Signed)
Procedure Name: Intubation Date/Time: 08/30/2017 2:46 PM Performed by: Oletta Lamas, CRNA Pre-anesthesia Checklist: Patient identified, Emergency Drugs available, Suction available and Patient being monitored Patient Re-evaluated:Patient Re-evaluated prior to induction Oxygen Delivery Method: Circle System Utilized Preoxygenation: Pre-oxygenation with 100% oxygen Induction Type: IV induction Ventilation: Mask ventilation without difficulty Laryngoscope Size: Miller and 3 Grade View: Grade I Tube type: Oral Tube size: 7.5 mm Number of attempts: 1 Airway Equipment and Method: Stylet and Oral airway Placement Confirmation: ETT inserted through vocal cords under direct vision,  positive ETCO2 and breath sounds checked- equal and bilateral Secured at: 23 cm Tube secured with: Tape Dental Injury: Teeth and Oropharynx as per pre-operative assessment

## 2017-08-30 NOTE — Anesthesia Preprocedure Evaluation (Signed)
Anesthesia Evaluation  Patient identified by MRN, date of birth, ID band Patient awake    Reviewed: Allergy & Precautions, NPO status , Patient's Chart, lab work & pertinent test results, reviewed documented beta blocker date and time   History of Anesthesia Complications Negative for: history of anesthetic complications  Airway Mallampati: IV  TM Distance: >3 FB Neck ROM: Full    Dental  (+) Teeth Intact   Pulmonary shortness of breath, COPD,  COPD inhaler, former smoker,    breath sounds clear to auscultation       Cardiovascular hypertension, Pt. on medications and Pt. on home beta blockers + DOE   Rhythm:Regular     Neuro/Psych negative neurological ROS     GI/Hepatic   Endo/Other  diabetesMorbid obesity  Renal/GU      Musculoskeletal  (+) Arthritis ,   Abdominal   Peds  Hematology   Anesthesia Other Findings   Reproductive/Obstetrics                             Anesthesia Physical Anesthesia Plan  ASA: III  Anesthesia Plan: General   Post-op Pain Management:    Induction: Intravenous  PONV Risk Score and Plan: 2 and Ondansetron and Dexamethasone  Airway Management Planned: Oral ETT  Additional Equipment: Arterial line  Intra-op Plan:   Post-operative Plan: Extubation in OR  Informed Consent: I have reviewed the patients History and Physical, chart, labs and discussed the procedure including the risks, benefits and alternatives for the proposed anesthesia with the patient or authorized representative who has indicated his/her understanding and acceptance.   Dental advisory given  Plan Discussed with: CRNA and Surgeon  Anesthesia Plan Comments:         Anesthesia Quick Evaluation

## 2017-08-31 ENCOUNTER — Encounter (HOSPITAL_COMMUNITY): Payer: Self-pay | Admitting: Neurosurgery

## 2017-08-31 LAB — GLUCOSE, CAPILLARY: GLUCOSE-CAPILLARY: 234 mg/dL — AB (ref 65–99)

## 2017-08-31 MED ORDER — HYDROCODONE-ACETAMINOPHEN 5-325 MG PO TABS: 1.0000 | ORAL_TABLET | ORAL | 0 refills | Status: DC | PRN

## 2017-08-31 MED FILL — Thrombin For Soln 5000 Unit: CUTANEOUS | Qty: 5000 | Status: AC

## 2017-08-31 NOTE — Progress Notes (Addendum)
Subjective: Patient reports "I'm doing alright"  Objective: Vital signs in last 24 hours: Temp:  [97.6 F (36.4 C)-98.6 F (37 C)] 97.6 F (36.4 C) (04/23 0300) Pulse Rate:  [53-70] 54 (04/23 0600) Resp:  [11-22] 11 (04/23 0600) BP: (139-186)/(67-128) 139/70 (04/23 0300) SpO2:  [90 %-98 %] 96 % (04/23 0600) Arterial Line BP: (105-186)/(67-98) 184/75 (04/22 1938) Weight:  [117.4 kg (258 lb 12.8 oz)] 117.4 kg (258 lb 12.8 oz) (04/22 1332)  Intake/Output from previous day: 04/22 0701 - 04/23 0700 In: 1981.3 [I.V.:1231.3; IV Piggyback:350] Out: 850 [Urine:750; Blood:100] Intake/Output this shift: No intake/output data recorded.  Alert, conversant. PEARL. MAEW.  Drsg intact over well-approximated incision. No drainage or erythema.   Lab Results: Recent Labs    08/30/17 1337 08/30/17 1538  HGB 15.6 13.9  HCT 46.0 41.0   BMET Recent Labs    08/30/17 1337 08/30/17 1538  NA 134* 134*  K 4.1 3.9  GLUCOSE 155*  --     Studies/Results: Dg Chest Port 1 View  Result Date: 08/30/2017 CLINICAL DATA:  Preop for brain biopsy EXAM: PORTABLE CHEST 1 VIEW COMPARISON:  CT chest 08/04/2017 FINDINGS: Normal heart size. Lungs clear. No pneumothorax. No pleural effusion. IMPRESSION: No active disease. Electronically Signed   By: Marybelle Killings M.D.   On: 08/30/2017 14:33    Assessment/Plan: stable  LOS: 1 day  Per Dr. Vertell Limber, d/c to home. Ok to remove drsg. Keep incision clean and dry. May shower, with attn to protecting incision. Ok to leave incision open to air. Office f/u in 2 weeks for staple removal. Norco 5/325 for prn home use.    Verdis Prime 08/31/2017, 10:51 AM  Patient is doing well.  Discharge home.

## 2017-08-31 NOTE — Discharge Summary (Signed)
Physician Discharge Summary  Patient ID: Tim [name] MRN: [id number] DOB/AGE: 1955-10-21 62 y.o.  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 Following uncomplicated stereotactic biopsy, he recovered nicely and transferred to [facility]. He is doing well.   Consults: None  Significant Diagnostic Studies: Pre-op MRI  Treatments: surgery: LEFT Sterotactic BIOPSY FRONTAL TUMOR (Left) - LEFT BIOPSY FRONTAL TUMOR APPLICATION OF CRANIAL NAVIGATION (Left)    Discharge Exam: Blood pressure 139/70, pulse (!) 54, temperature 97.6 F (36.4 C), temperature source Oral, resp. rate 11, height 6\' 3"  (1.905 m), weight 117.4 kg (258 lb 12.8 oz), SpO2 96 %. Alert, conversant. PEARL. MAEW. Drsg intact over well-approximated incision. No drainage or erythema.     Disposition: Discharge disposition: 01-Home or [place] to remove drsg. Keep incision clean and dry. May shower, with attn to protecting incision. Ok to leave incision open to air. Office f/u in 2 weeks for staple removal. Norco 5/325 for prn home use.         Allergies as of 08/31/2017   No Known Allergies     Medication List    TAKE these medications   acetaminophen 500 MG tablet Commonly known as:  TYLENOL Take 1,000 mg by mouth every 8 (eight) hours as needed.   albuterol 108 (90 Base) MCG/ACT inhaler Commonly known as:  PROVENTIL HFA;VENTOLIN HFA Inhale 2 puffs into the lungs every 4 (four) hours as needed for wheezing or shortness of breath.   allopurinol 300 MG tablet Commonly  known as:  ZYLOPRIM Take 300 mg by mouth daily.   atenolol 100 MG tablet Commonly known as:  TENORMIN Take 50 mg by mouth daily.   b complex vitamins tablet Take 1 tablet by mouth daily.   dexamethasone 4 MG tablet Commonly known as:  DECADRON Take 1 tablet (4 mg total) by mouth daily.   famotidine 20 MG tablet Commonly known as:  PEPCID One at bedtime   HYDROcodone-acetaminophen 5-325 MG tablet Commonly known as:  NORCO/VICODIN Take 1 tablet by mouth every 4 (four) hours as needed for moderate pain.   ibuprofen 200 MG tablet Commonly known as:  ADVIL,MOTRIN Take 800 mg by mouth daily as needed for moderate pain.   multivitamin tablet Take 1 tablet by mouth daily.   pantoprazole 40 MG tablet Commonly known as:  PROTONIX Take 1 tablet (40 mg total) by mouth daily. Take 30-60 min before first meal of the day   valsartan 160 MG tablet Commonly known as:  DIOVAN Take 1 tablet (160 mg total) by mouth daily.        Signed: Peggyann Shoals, MD 08/31/2017, 11:00 AM

## 2017-08-31 NOTE — Discharge Instructions (Signed)
Ok to remove drsg. Keep incision clean and dry. May shower, with attention to protecting incision. Ok to leave incision open to air. Office f/u in 2 weeks for staple removal. Norco 5/325 for prn home use.  Call Dr. Launa Flight office for a follow up appointment. Need to see you in 2 weeks for staple removal. [address], [address]. [place], [place] [address] [phone]

## 2017-09-06 ENCOUNTER — Other Ambulatory Visit: Payer: Self-pay | Admitting: Internal Medicine

## 2017-09-06 DIAGNOSIS — C8339 Primary central nervous system lymphoma: Secondary | ICD-10-CM

## 2017-09-06 DIAGNOSIS — C8589 Other specified types of non-Hodgkin lymphoma, extranodal and solid organ sites: Secondary | ICD-10-CM

## 2017-09-07 ENCOUNTER — Telehealth: Payer: Self-pay | Admitting: Internal Medicine

## 2017-09-07 ENCOUNTER — Inpatient Hospital Stay: Payer: Medicare Other | Attending: Internal Medicine | Admitting: Internal Medicine

## 2017-09-07 ENCOUNTER — Other Ambulatory Visit: Payer: Self-pay

## 2017-09-07 ENCOUNTER — Encounter: Payer: Self-pay | Admitting: Internal Medicine

## 2017-09-07 ENCOUNTER — Other Ambulatory Visit: Payer: Self-pay | Admitting: Internal Medicine

## 2017-09-07 ENCOUNTER — Telehealth: Payer: Self-pay | Admitting: *Deleted

## 2017-09-07 VITALS — BP 95/65 | HR 59 | Temp 97.7°F | Resp 18 | Ht 75.0 in | Wt 251.1 lb

## 2017-09-07 DIAGNOSIS — Z9981 Dependence on supplemental oxygen: Secondary | ICD-10-CM | POA: Diagnosis not present

## 2017-09-07 DIAGNOSIS — C8591 Non-Hodgkin lymphoma, unspecified, lymph nodes of head, face, and neck: Secondary | ICD-10-CM

## 2017-09-07 DIAGNOSIS — D496 Neoplasm of unspecified behavior of brain: Secondary | ICD-10-CM | POA: Diagnosis present

## 2017-09-07 DIAGNOSIS — C8589 Other specified types of non-Hodgkin lymphoma, extranodal and solid organ sites: Secondary | ICD-10-CM

## 2017-09-07 NOTE — Telephone Encounter (Signed)
Faxed Progress note for 08/02/17 to [place] with [facility] to [phone]

## 2017-09-07 NOTE — Telephone Encounter (Signed)
Patient needs a split lamp lymphoma exam prior to beginning any treatment options.  Appointment made at [facility] for September 07, 2017 @ 1:30 with Dr Wyatt Portela.  Records faxed to [phone]

## 2017-09-07 NOTE — Progress Notes (Signed)
[place] at [place], [place] [address] [phone]   Interval Evaluation  Date of Service: 09/07/17 Patient Name: Tim [name] Patient MRN: [id number] Patient DOB: [birth year] Provider: Ventura Sellers, MD  Identifying Statement:  Tim [name] is a 62 y.o. male with left frontal CNS lymphoma  Interval History:  Tim [name] presents for follow up after recent brain biopsy.  He tolerated surgery without any ill effect.  Although he discontinued decadron ~2 weeks ago (4 days prior to biopsy) he has not had return of language impairment.  He does not described weakness or numbness of his right side or gait impairment.  Recent evaluation by pulmonology led to discontinuation of ACE inhibitor (now on an ARB) and NO diagnosis of COPD.  Continues to use home O2 at night but for unspecified purpose/diagnosis.  No seizures or severe headaches.    Medications: Current Outpatient Medications on File Prior to Visit  Medication Sig Dispense Refill  . acetaminophen (TYLENOL) 500 MG tablet Take 1,000 mg by mouth every 8 (eight) hours as needed.    Marland Kitchen albuterol (PROVENTIL HFA;VENTOLIN HFA) 108 (90 Base) MCG/ACT inhaler Inhale 2 puffs into the lungs every 4 (four) hours as needed for wheezing or shortness of breath.    . allopurinol (ZYLOPRIM) 300 MG tablet Take 300 mg by mouth daily.    Marland Kitchen atenolol (TENORMIN) 100 MG tablet Take 50 mg by mouth daily.    Marland Kitchen b complex vitamins tablet Take 1 tablet by mouth daily.    Marland Kitchen dexamethasone (DECADRON) 4 MG tablet Take 1 tablet (4 mg total) by mouth daily. 30 tablet 2  . famotidine (PEPCID) 20 MG tablet One at bedtime 30 tablet 11  . HYDROcodone-acetaminophen (NORCO/VICODIN) 5-325 MG tablet Take 1 tablet by mouth every 4 (four) hours as needed for moderate pain. 30 tablet 0  . ibuprofen (ADVIL,MOTRIN) 200 MG tablet Take 800 mg by mouth daily as needed for moderate pain.    . Multiple Vitamin  (MULTIVITAMIN) tablet Take 1 tablet by mouth daily.    . pantoprazole (PROTONIX) 40 MG tablet Take 1 tablet (40 mg total) by mouth daily. Take 30-60 min before first meal of the day 30 tablet 2  . valsartan (DIOVAN) 160 MG tablet Take 1 tablet (160 mg total) by mouth daily. 30 tablet 11   No current facility-administered medications on file prior to visit.     Allergies: No Known Allergies Past Medical History:  Past Medical History:  Diagnosis Date  . Arthritis   . Brain tumor ([place])   . COPD (chronic obstructive pulmonary disease) ([place])   . Diabetes mellitus without complication [facility])    patient states he was taken off meds at last appointment with PCP and is diet controlled  . Dyspnea   . Gout   . Hypertension 08/02/2017  . Pneumonia    Past Surgical History: none    Social History:  Social History   Socioeconomic History  . Marital status: Significant Other    Spouse name: Tim [name]  . Number of children: Not on file  . Years of education: 63  . Highest education level: Bachelor's degree (e.g., BA, AB, BS)  Occupational History  . 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 Financial resource strain: Not hard at all  . Food insecurity:    Worry: Never true    Inability: Never true  . Transportation  needs:    Medical: No    Non-medical: No  Tobacco Use  . Smoking status: Former Smoker    Types: Cigarettes    Last attempt to quit: 2013    Years since quitting: 6.3  . Smokeless tobacco: Never Used  Substance and Sexual Activity  . Alcohol use: Yes    Alcohol/week: 8.4 oz    Types: 14 Cans of beer per week    Frequency: Never  . Drug use: Never  . Sexual activity: Not Currently  Lifestyle  . Physical activity:    Days per week: 0 days    Minutes per session: 0 min  . Stress: Only a little  Relationships  . Social connections:    Talks on phone: Once a week    Gets together: Once a week    Attends religious service: Never     Active member of club or organization: No    Attends meetings of clubs or organizations: Never    Relationship status: Living with partner  . Intimate partner violence:    Fear of current or ex partner: No    Emotionally abused: No    Physically abused: No    Forced sexual activity: No  Other Topics Concern  . Not on file  Social History Narrative  . Not on file   Family History:  Family History  Problem Relation Age of Onset  . Cancer Brother     Review of Systems: Constitutional: Denies fevers, chills or abnormal weight loss Eyes: Denies blurriness of vision Ears, nose, mouth, throat, and face: Denies mucositis or sore throat Respiratory: chronic hoarseness Cardiovascular: Denies palpitation, chest discomfort or lower extremity swelling Gastrointestinal:  Denies nausea, constipation, diarrhea GU: Denies dysuria or incontinence Skin: Denies abnormal skin rashes Neurological: Per HPI Musculoskeletal: Denies joint pain, back or neck discomfort. No decrease in ROM Behavioral/Psych: Denies anxiety, disturbance in thought content, and mood instability  Physical Exam: Vitals:   09/07/17 1155  BP: 95/65  Pulse: (!) 59  Resp: 18  Temp: 97.7 F (36.5 C)  SpO2: 91%   KPS: 80. General: Alert, cooperative, pleasant, in no acute distress Head: Biopsy scar noted, dry and intact. EENT: No conjunctival injection or scleral icterus. Oral mucosa moist Lungs: Resp effort normal Cardiac: Regular rate and rhythm Abdomen: Soft, non-distended abdomen Skin: No rashes cyanosis or petechiae. Extremities: No clubbing or edema  Neurologic Exam: Mental Status: Awake, alert, attentive to examiner. Oriented to self and environment. Language fluent with intact comprehension, repetition, reading. Cranial Nerves: Visual acuity is grossly normal. Visual fields are full. Extra-ocular movements intact. No ptosis. Face is symmetric, tongue midline. Motor: Tone and bulk are normal. Power 5/5 in arms  and legs. Reflexes are symmetric, no pathologic reflexes present. Intact finger to nose bilaterally Sensory: Intact to light touch and temperature Gait: Normal, independent   Labs: I have reviewed the data as listed    Component Value Date/Time   NA 134 (L) 08/30/2017 1538   K 3.9 08/30/2017 1538   CL 89 (L) 08/23/2017 1502   CO2 26 08/23/2017 1502   GLUCOSE 155 (H) 08/30/2017 1337   BUN 14 08/23/2017 1502   CREATININE 0.72 08/23/2017 1502   CALCIUM 8.7 (L) 08/23/2017 1502   GFRNONAA >60 08/23/2017 1502   GFRAA >60 08/23/2017 1502   Lab Results  Component Value Date   WBC 17.8 (H) 08/23/2017   NEUTROABS 4.9 03/28/2007   HGB 13.9 08/30/2017   HCT 41.0 08/30/2017   MCV 91.5 08/23/2017  PLT 140 (L) 08/23/2017    Imaging:   [facility] Clinician Interpretation: I have personally reviewed the CNS images as listed.  My interpretation, in the context of the patient's clinical presentation, is very likely CNS lymphoma  Mr Tim [name] Contrast  Result Date: 08/17/2017 CLINICAL DATA:  SRS targeting.  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 EXAM: MRI HEAD WITHOUT AND WITH CONTRAST TECHNIQUE: Multiplanar, multiecho pulse sequences of the brain and surrounding structures were obtained without and with intravenous contrast. CONTRAST:  35mL MULTIHANCE GADOBENATE DIMEGLUMINE 529 MG/ML IV SOLN COMPARISON:  CT chest abdomen pelvis was negative. Previous low field strength MRI 07/29/2017. FINDINGS: Brain: Multifocal areas of restricted diffusion, largest of which is in the LEFT centrum semiovale, LEFT posterior frontal cortex and adjacent white matter, with other areas of restriction involving the genu of the corpus callosum and the RIGHT forceps major. Since the prior MRI, steroids have resulted in partial repair of the blood brain barrier, which has led to a paucity of enhancement on today's scan. Mild cloudlike enhancement can be seen in the large LEFT frontal lesion in the subcortical white matter, reference  image 10 series 12. On FLAIR imaging today, cross-section of the LEFT frontal lesion is 37 x 45 mm. Vascular: Normal flow voids.  Dolichoectasia. Skull and upper cervical spine: Normal marrow signal. Sinuses/Orbits: Negative.  No signs of orbital lymphoma. Other: None. 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 Paucity of enhancement is seen on today's examination compared to the previous study, likely related to steroid administration. See discussion above. Electronically Signed   By: Staci Righter M.D.   On: 08/17/2017 08:28   Dg Chest Port 1 View  Result Date: 08/30/2017 CLINICAL DATA:  Preop for brain biopsy EXAM: PORTABLE CHEST 1 VIEW COMPARISON:  CT chest 08/04/2017 FINDINGS: Normal heart size. Lungs clear. No pneumothorax. No pleural effusion. IMPRESSION: No active disease. Electronically Signed   By: Marybelle Killings M.D.   On: 08/30/2017 14:33   Pathology   Assessment/Plan  CNS lymphoma [facility])  Tim [name] is clinically and radiographically stable today.  His inpatient course and biopsy results were reviewed with neurosurgery, neuroradiology and pathology in mult-disciplinary brain tumor board.  Although the histology is not formally diagnostic, there is still high likelihood of missed diagnosis because of vanishing effect from dexamethasone.      For further workup we have scheduled whole body PET, slit lamp exam, and CSF sampling for cell counts, protein, glucose, cytology and flow cytometry.  Lumbar puncture to be performed under fluoroscopy by IR.  Once that workup is complete, we will refer for second clinical and histologic opinion at [place].  We discussed most likely course of therapy, which will include high dose methotrexate and rituximab delivered in inpatient  setting.    We recommend he remain off of steroids as long as symptoms are absent.  He should call us with any new or progressive neurologic deficits.  We appreciate the opportunity to participate in the care of Tim [name].   Follow up at this time is TBD based on workup above.  All questions were answered. The patient knows to call the clinic with any problems, questions or concerns. No barriers to learning were detected.  The total time spent in the encounter was 45 minutes and more than 50% was on  counseling and review of test results   Ventura Sellers, MD Medical Director of Neuro-Oncology [facility] at [place] 09/07/17 12:04 PM

## 2017-09-08 ENCOUNTER — Telehealth: Payer: Self-pay

## 2017-09-08 NOTE — Telephone Encounter (Signed)
Per 4/30 no los 

## 2017-09-09 ENCOUNTER — Institutional Professional Consult (permissible substitution): Payer: Medicare Other | Admitting: Emergency Medicine

## 2017-09-10 ENCOUNTER — Ambulatory Visit (HOSPITAL_COMMUNITY)
Admission: RE | Admit: 2017-09-10 | Discharge: 2017-09-10 | Disposition: A | Discharge status: Home / Self Care | Payer: Medicare Other | Source: Ambulatory Visit | Attending: Internal Medicine | Admitting: Internal Medicine

## 2017-09-10 DIAGNOSIS — C8339 Primary central nervous system lymphoma: Secondary | ICD-10-CM

## 2017-09-10 DIAGNOSIS — C8589 Other specified types of non-Hodgkin lymphoma, extranodal and solid organ sites: Secondary | ICD-10-CM | POA: Diagnosis present

## 2017-09-10 LAB — GLUCOSE, CAPILLARY: Glucose-Capillary: 141 mg/dL — ABNORMAL HIGH (ref 65–99)

## 2017-09-10 MED ORDER — FLUDEOXYGLUCOSE F - 18 (FDG) INJECTION
13.5000 | Freq: Once | INTRAVENOUS | Status: AC | PRN
  Administered 2017-09-10: 13.5 via INTRAVENOUS

## 2017-09-14 ENCOUNTER — Ambulatory Visit (HOSPITAL_COMMUNITY)
Admission: RE | Admit: 2017-09-14 | Discharge: 2017-09-14 | Disposition: A | Discharge status: Home / Self Care | Payer: Medicare Other | Source: Ambulatory Visit | Attending: Internal Medicine | Admitting: Internal Medicine

## 2017-09-14 DIAGNOSIS — C8589 Other specified types of non-Hodgkin lymphoma, extranodal and solid organ sites: Secondary | ICD-10-CM

## 2017-09-14 DIAGNOSIS — C8599 Non-Hodgkin lymphoma, unspecified, extranodal and solid organ sites: Secondary | ICD-10-CM | POA: Diagnosis present

## 2017-09-14 LAB — CSF CELL COUNT WITH DIFFERENTIAL
RBC COUNT CSF: 5 /mm3 — AB
Tube #: 4
WBC CSF: 2 /mm3 (ref 0–5)

## 2017-09-14 LAB — PROTEIN, CSF: Total  Protein, CSF: 28 mg/dL (ref 15–45)

## 2017-09-14 LAB — GLUCOSE, CSF: Glucose, CSF: 97 mg/dL — ABNORMAL HIGH (ref 40–70)

## 2017-09-14 MED ORDER — LIDOCAINE HCL 1 % IJ SOLN
INTRAMUSCULAR | Status: AC
  Filled 2017-09-14: qty 20

## 2017-09-14 NOTE — Discharge Instructions (Signed)
Please remain flat the remainder of the day.     Lumbar Puncture, Care After Refer to this sheet in the next few weeks. These instructions provide you with information on caring for yourself after your procedure. Your health care provider may also give you more specific instructions. Your treatment has been planned according to current medical practices, but problems sometimes occur. Call your health care provider if you have any problems or questions after your procedure. What can I expect after the procedure? After your procedure, it is typical to have the following sensations:  Mild discomfort or pain at the insertion site.  Mild headache that is relieved with pain medicines.  Follow these instructions at home:   Avoid lifting anything heavier than 10 lb (4.5 kg) for at least 12 hours after the procedure.  Drink enough fluids to keep your urine clear or pale yellow. Contact a health care provider if:  You have fever or chills.  You have nausea or vomiting.  You have a headache that lasts for more than 2 days. Get help right away if:  You have any numbness or tingling in your legs.  You are unable to control your bowel or bladder.  You have bleeding or swelling in your back at the insertion site.  You are dizzy or faint. This information is not intended to replace advice given to you by your health care provider. Make sure you discuss any questions you have with your health care provider. Document Released: 05/02/2013 Document Revised: 10/03/2015 Document Reviewed: 01/03/2013 Elsevier Interactive Patient Education  2017 [employer].

## 2017-09-15 ENCOUNTER — Ambulatory Visit (INDEPENDENT_AMBULATORY_CARE_PROVIDER_SITE_OTHER): Payer: Medicare Other | Admitting: Adult Health

## 2017-09-15 ENCOUNTER — Ambulatory Visit (INDEPENDENT_AMBULATORY_CARE_PROVIDER_SITE_OTHER)
Admission: RE | Admit: 2017-09-15 | Discharge: 2017-09-15 | Disposition: A | Discharge status: Home / Self Care | Payer: Medicare Other | Source: Ambulatory Visit | Attending: Adult Health | Admitting: Adult Health

## 2017-09-15 ENCOUNTER — Encounter: Payer: Self-pay | Admitting: Adult Health

## 2017-09-15 ENCOUNTER — Other Ambulatory Visit (INDEPENDENT_AMBULATORY_CARE_PROVIDER_SITE_OTHER): Payer: Medicare Other

## 2017-09-15 VITALS — BP 100/72 | HR 87 | Ht 76.0 in | Wt 246.0 lb

## 2017-09-15 DIAGNOSIS — R0602 Shortness of breath: Secondary | ICD-10-CM | POA: Diagnosis not present

## 2017-09-15 DIAGNOSIS — R0609 Other forms of dyspnea: Secondary | ICD-10-CM

## 2017-09-15 DIAGNOSIS — J449 Chronic obstructive pulmonary disease, unspecified: Secondary | ICD-10-CM

## 2017-09-15 DIAGNOSIS — R942 Abnormal results of pulmonary function studies: Secondary | ICD-10-CM | POA: Diagnosis not present

## 2017-09-15 DIAGNOSIS — J9611 Chronic respiratory failure with hypoxia: Secondary | ICD-10-CM | POA: Diagnosis not present

## 2017-09-15 LAB — SEDIMENTATION RATE: Sed Rate: 102 mm/hr — ABNORMAL HIGH (ref 0–20)

## 2017-09-15 LAB — CBC WITH DIFFERENTIAL/PLATELET
BASOS ABS: 0 10*3/uL (ref 0.0–0.1)
Basophils Relative: 0.4 % (ref 0.0–3.0)
Eosinophils Absolute: 0.1 10*3/uL (ref 0.0–0.7)
Eosinophils Relative: 1.8 % (ref 0.0–5.0)
HCT: 41.4 % (ref 39.0–52.0)
Hemoglobin: 13.9 g/dL (ref 13.0–17.0)
LYMPHS ABS: 2.3 10*3/uL (ref 0.7–4.0)
Lymphocytes Relative: 30.7 % (ref 12.0–46.0)
MCHC: 33.6 g/dL (ref 30.0–36.0)
MCV: 95.6 fl (ref 78.0–100.0)
MONO ABS: 1.1 10*3/uL — AB (ref 0.1–1.0)
Monocytes Relative: 15.1 % — ABNORMAL HIGH (ref 3.0–12.0)
Neutro Abs: 3.9 10*3/uL (ref 1.4–7.7)
Neutrophils Relative %: 52 % (ref 43.0–77.0)
Platelets: 377 10*3/uL (ref 150.0–400.0)
RBC: 4.33 Mil/uL (ref 4.22–5.81)
RDW: 13.4 % (ref 11.5–15.5)
WBC: 7.5 10*3/uL (ref 4.0–10.5)

## 2017-09-15 MED ORDER — AMOXICILLIN-POT CLAVULANATE 875-125 MG PO TABS: 1.0000 | ORAL_TABLET | Freq: Two times a day (BID) | ORAL | 0 refills | Status: AC

## 2017-09-15 MED ORDER — UMECLIDINIUM-VILANTEROL 62.5-25 MCG/INH IN AEPB: 1.0000 | INHALATION_SPRAY | Freq: Every day | RESPIRATORY_TRACT | 0 refills | Status: DC

## 2017-09-15 NOTE — Assessment & Plan Note (Signed)
Questionable COPD exacerbation with recent bronchitic symptoms. We will add Anoro and tx w/ abx with close follow up   Plan  Patient Instructions  Chest xray today  Begin Augmentin 875mg  Twice daily  For 1 week  Begin Oxygen 2l/m with activity.  Begin ANORO 1 puff daily rinse after use.  Follow up in 1 week and As needed   Please contact office for sooner follow up if symptoms do not improve or worsen or seek emergency care

## 2017-09-15 NOTE — Progress Notes (Signed)
'@Patient'  ID: Tim [name], male    DOB: 1955/08/04, 62 y.o.   MRN: [id number]  Chief Complaint  Patient presents with  . Acute Visit    dyspnea    Referring provider: Leonard Downing, *  HPI: 62 year old male former smoker (quit 2012)  seen for pulmonary consult on August 24, 2017 for surgical clearance for brain tumor biopsy. PMH - Disabled Mental   TEST  Spirometry 08/24/2017  FEV1 1.68 (40%)  Ratio 53    09/15/2017 Acute OV :  Patient presents for an acute office visit. Complains of  Patient was seen for a pulmonary consult on August 24, 2017 for surgical clearance consult Underwent brain biopsy on 08/30/17 . Path was not formally diagnositc , high likelihood of CNS lymphoma . Oncology is following and further workup is underway with PET scan and LP .  ACE was stopped.  Last office visit was found to have Severe COPD . He was set up for full PFT and ACE inhibiitor was stopped.  Pet scan done Sep 10, 2017 showed numerous pulmonary lesions which were hypermetabolic along with small mediastinal and hilar nodes that were hypermetabolic.  Felt to be consistent with inflammatory infectious pulmonary process pulmonary lymphoma was felt highly unlikely.  Has Chronic dyspnea for ~10 year. Started on Oxygen At bedtime  ~2011. Sleep study showed no sleep apnea but nocturnal hypoxia .   Gets very winded with minimal activity , very low energy . Since biopsy developed 2-3 weeks ago developed cough with thick mucus .   In March had significant change in mental status with severe weakness and speech changes . MRI brain showed brain lesions . Started on steroids /decadron with improved mental status changes. Off steroids for last 2-3 weeks.     No Known Allergies   There is no immunization history on file for this patient.  Past Medical History:  Diagnosis Date  . Arthritis   . Brain tumor ([place])   . COPD (chronic obstructive pulmonary disease) ([place])   . Diabetes mellitus  without complication [facility])    patient states he was taken off meds at last appointment with PCP and is diet controlled  . Dyspnea   . Gout   . Hypertension 08/02/2017  . Pneumonia     Tobacco History: Social History   Tobacco Use  Smoking Status Former Smoker  . Types: Cigarettes  . Last attempt to quit: 2013  . Years since quitting: 6.3  Smokeless Tobacco Never Used   Counseling given: Not Answered   Outpatient Encounter Medications as of 09/15/2017  Medication Sig  . acetaminophen (TYLENOL) 500 MG tablet Take 1,000 mg by mouth every 8 (eight) hours as needed.  Marland Kitchen albuterol (PROVENTIL HFA;VENTOLIN HFA) 108 (90 Base) MCG/ACT inhaler Inhale 2 puffs into the lungs every 4 (four) hours as needed for wheezing or shortness of breath.  . allopurinol (ZYLOPRIM) 300 MG tablet Take 300 mg by mouth daily.  Marland Kitchen atenolol (TENORMIN) 100 MG tablet Take 50 mg by mouth daily.  Marland Kitchen b complex vitamins tablet Take 1 tablet by mouth daily.  Marland Kitchen dexamethasone (DECADRON) 4 MG tablet Take 1 tablet (4 mg total) by mouth daily.  . famotidine (PEPCID) 20 MG tablet One at bedtime  . Multiple Vitamin (MULTIVITAMIN) tablet Take 1 tablet by mouth daily.  . pantoprazole (PROTONIX) 40 MG tablet Take 1 tablet (40 mg total) by mouth daily. Take 30-60 min before first meal of the day  . valsartan (DIOVAN) 160 MG tablet  Take 1 tablet (160 mg total) by mouth daily.  Marland Kitchen amoxicillin-clavulanate (AUGMENTIN) 875-125 MG tablet Take 1 tablet by mouth 2 (two) times daily for 7 days.  Marland Kitchen umeclidinium-vilanterol (ANORO ELLIPTA) 62.5-25 MCG/INH AEPB Inhale 1 puff into the lungs daily.  . [DISCONTINUED] HYDROcodone-acetaminophen (NORCO/VICODIN) 5-325 MG tablet Take 1 tablet by mouth every 4 (four) hours as needed for moderate pain.  . [DISCONTINUED] ibuprofen (ADVIL,MOTRIN) 200 MG tablet Take 800 mg by mouth daily as needed for moderate pain.   No facility-administered encounter medications on file as of 09/15/2017.      Review of  Systems  Constitutional:   No  weight loss, night sweats,  Fevers, chills, fatigue, or  lassitude.  HEENT:   No headaches,  Difficulty swallowing,  Tooth/dental problems, or  Sore throat,                No sneezing, itching, ear ache, nasal congestion, post nasal drip,   CV:  No chest pain,  Orthopnea, PND, swelling in lower extremities, anasarca, dizziness, palpitations, syncope.   GI  No heartburn, indigestion, abdominal pain, nausea, vomiting, diarrhea, change in bowel habits, loss of appetite, bloody stools.   Resp: No shortness of breath with exertion or at rest.  No excess mucus, no productive cough,  No non-productive cough,  No coughing up of blood.  No change in color of mucus.  No wheezing.  No chest wall deformity  Skin: no rash or lesions.  GU: no dysuria, change in color of urine, no urgency or frequency.  No flank pain, no hematuria   MS:  No joint pain or swelling.  No decreased range of motion.  No back pain.    Physical Exam  BP 100/72 (BP Location: Left Arm, Cuff Size: Normal)   Pulse 87   Ht '6\' 4"'  (1.93 m)   Wt 246 lb (111.6 kg)   SpO2 90%   BMI 29.94 kg/m   GEN: A/Ox3; pleasant , NAD, well nourished    HEENT:  [place]/AT,  EACs-clear, TMs-wnl, NOSE-clear, THROAT-clear, no lesions, no postnasal drip or exudate noted.   NECK:  Supple w/ fair ROM; no JVD; normal carotid impulses w/o bruits; no thyromegaly or nodules palpated; no lymphadenopathy.    RESP  Clear  P & A; w/o, wheezes/ rales/ or rhonchi. no accessory muscle use, no dullness to percussion  CARD:  RRR, no m/r/g, no peripheral edema, pulses intact, no cyanosis or clubbing.  GI:   Soft & nt; nml bowel sounds; no organomegaly or masses detected.   Musco: Warm bil, no deformities or joint swelling noted.   Neuro: alert, no focal deficits noted.    Skin: Warm, no lesions or rashes    Lab Results:  CBC    Component Value Date/Time   WBC 17.8 (H) 08/23/2017 1502   RBC 4.97 08/23/2017 1502    HGB 13.9 08/30/2017 1538   HCT 41.0 08/30/2017 1538   PLT 140 (L) 08/23/2017 1502   MCV 91.5 08/23/2017 1502   MCH 32.4 08/23/2017 1502   MCHC 35.4 08/23/2017 1502   RDW 12.5 08/23/2017 1502   LYMPHSABS 2.0 03/28/2007 1525   MONOABS 0.8 03/28/2007 1525   EOSABS 0.1 (L) 03/28/2007 1525   BASOSABS 0.0 03/28/2007 1525    BMET    Component Value Date/Time   NA 134 (L) 08/30/2017 1538   K 3.9 08/30/2017 1538   CL 89 (L) 08/23/2017 1502   CO2 26 08/23/2017 1502   GLUCOSE 155 (H) 08/30/2017 1337  BUN 14 08/23/2017 1502   CREATININE 0.72 08/23/2017 1502   CALCIUM 8.7 (L) 08/23/2017 1502   GFRNONAA >60 08/23/2017 1502   GFRAA >60 08/23/2017 1502    BNP No results found for: BNP  ProBNP No results found for: PROBNP  Imaging: Mr Jeri Cos ER Contrast  Result Date: 08/17/2017 CLINICAL DATA:  SRS targeting.  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 EXAM: MRI HEAD WITHOUT AND WITH CONTRAST TECHNIQUE: Multiplanar, multiecho pulse sequences of the brain and surrounding structures were obtained without and with intravenous contrast. CONTRAST:  56m MULTIHANCE GADOBENATE DIMEGLUMINE 529 MG/ML IV SOLN COMPARISON:  CT chest abdomen pelvis was negative. Previous low field strength MRI 07/29/2017. FINDINGS: Brain: Multifocal areas of restricted diffusion, largest of which is in the LEFT centrum semiovale, LEFT posterior frontal cortex and adjacent white matter, with other areas of restriction involving the genu of the corpus callosum and the RIGHT forceps major. Since the prior MRI, steroids have resulted in partial repair of the blood brain barrier, which has led to a paucity of enhancement on today's scan. Mild cloudlike enhancement can be seen in the large LEFT frontal lesion in the subcortical white matter, reference image 10 series 12. On FLAIR imaging today, cross-section of the LEFT frontal lesion is 37 x 45 mm. Vascular: Normal flow voids.  Dolichoectasia. Skull and upper cervical spine: Normal marrow  signal. Sinuses/Orbits: Negative.  No signs of orbital lymphoma. Other: None. 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 Paucity of enhancement is seen on today's examination compared to the previous study, likely related to steroid administration. See discussion above. Electronically Signed   By: JStaci RighterM.D.   On: 08/17/2017 08:28   Nm Pet Image Initial (pi) Skull Base To Thigh  Result Date: 09/10/2017 CLINICAL DATA:  Initial treatment strategy for CNS lymphoma. EXAM: NUCLEAR MEDICINE PET SKULL BASE TO THIGH TECHNIQUE: 13.5 mCi F-18 FDG was injected intravenously. Full-ring PET imaging was performed from the skull base to thigh after the radiotracer. CT data was obtained and used for attenuation correction and anatomic localization. Fasting blood glucose: 141 mg/dl COMPARISON:  CT scans 08/04/2017 and MRI brain 08/16/2017. FINDINGS: Mediastinal blood pool activity: SUV max 2.85 NECK: No hypermetabolic lymph nodes in the neck. Incidental CT findings: none CHEST: Numerous ill-defined hazy pulmonary lesions are hypermetabolic. Findings most consistent with some type of inflammatory or infectious lung disease or possibly drug reaction. There also hypermetabolic hilar and mediastinal nodes. These are not enlarged however and again I think these are probably reactive. This does not look like lymphoma in the lungs and the chest CT from 08/04/2017 was relatively normal. Incidental CT findings: none ABDOMEN/PELVIS: No abnormal hypermetabolic activity within the liver, pancreas, adrenal glands, or spleen. No hypermetabolic lymph nodes in the abdomen or pelvis. Incidental CT findings: The liver and spleen are normal in size. SKELETON: No focal hypermetabolic activity to suggest skeletal metastasis. Incidental CT  findings: none IMPRESSION: 1. Numerous pulmonary lesions which are hypermetabolic along with hypermetabolic small mediastinal and hilar nodes. Findings most likely due to an inflammatory or infectious pulmonary process or possible drug reaction. Pulmonary lymphoma is highly unlikely. Recommend correlation with clinical findings and appropriate treatment. A short-term follow-up chest CT may be helpful (2-3 months). 2. No findings for lymphoma Nissen vomit of the abdomen/pelvis or skeletal structures. No axillary or inguinal adenopathy.  Electronically Signed   By: Marijo Sanes M.D.   On: 09/10/2017 17:09   Dg Chest Port 1 View  Result Date: 08/30/2017 CLINICAL DATA:  Preop for brain biopsy EXAM: PORTABLE CHEST 1 VIEW COMPARISON:  CT chest 08/04/2017 FINDINGS: Normal heart size. Lungs clear. No pneumothorax. No pleural effusion. IMPRESSION: No active disease. Electronically Signed   By: Marybelle Killings M.D.   On: 08/30/2017 14:33   Dg Fluoro Guided Loc Of Needle/cath Tip For Spinal Inject Lt  Result Date: 09/14/2017 CLINICAL DATA:  CNS lymphoma EXAM: DIAGNOSTIC LUMBAR PUNCTURE UNDER FLUOROSCOPIC GUIDANCE FLUOROSCOPY TIME:  Fluoroscopy Time:  37 seconds Radiation Exposure Index (if provided by the fluoroscopic device): 20.27 mGy Number of Acquired Spot Images: 0 PROCEDURE: Informed consent was obtained from the patient prior to the procedure, including potential complications of headache, allergy, and pain. With the patient prone, the lower back was prepped with Betadine. 1% Lidocaine was used for local anesthesia. Lumbar puncture was performed at the L2-3 level using a 22 gauge needle with return of clear CSF with an opening pressure of 15 cm water. 12.0 ml of CSF were obtained for laboratory studies. The patient tolerated the procedure well and there were no apparent complications. IMPRESSION: Technically successful lumbar puncture with removal of approximately 12 cc of clear CSF. Electronically Signed   By:  Kerby Moors M.D.   On: 09/14/2017 12:52     Assessment & Plan:   COPD ? GOLD III vs ACEi effect  Questionable COPD exacerbation with recent bronchitic symptoms. We will add Anoro and tx w/ abx with close follow up   Plan  Patient Instructions  Chest xray today  Begin Augmentin 822m Twice daily  For 1 week  Begin Oxygen 2l/m with activity.  Begin ANORO 1 puff daily rinse after use.  Follow up in 1 week and As needed   Please contact office for sooner follow up if symptoms do not improve or worsen or seek emergency care       Abnormal PET scan of lung Abnormal PET scan with hypermetabolic pulmonary lesions that are new since March 2019 CT chest that showed clear lungs.  Questionable etiology. Patient is undergoing work-up with oncology for possible CNS lymphoma.  Brain biopsy was nondiagnostic.  Patient now with cough worsening shortness of breath and pulmonary lesions We will treat for possible underlying infectious process.  Check labs with sed rate CBC be met BNP and d-dimer. Check chest x-ray today On return if not improving consider [facility]  Plan  Patient Instructions  Chest xray today  Begin Augmentin 8762mTwice daily  For 1 week  Begin Oxygen 2l/m with activity.  Begin ANORO 1 puff daily rinse after use.  Follow up in 1 week and As needed   Please contact office for sooner follow up if symptoms do not improve or worsen or seek emergency care      Chronic respiratory failure with hypoxia ([place] respiratory failure on home oxygen for several years.  At nighttime.  Patient now with mild desaturation with ambulation.   Begin Oxygen 2 L with activity. follow up in 1 week .      TaRexene EdisonNP 09/15/2017

## 2017-09-15 NOTE — Patient Instructions (Addendum)
Chest xray today  Begin Augmentin 875mg  Twice daily  For 1 week  Begin Oxygen 2l/m with activity.  Begin ANORO 1 puff daily rinse after use.  Follow up in 1 week and As needed   Please contact office for sooner follow up if symptoms do not improve or worsen or seek emergency care

## 2017-09-15 NOTE — Assessment & Plan Note (Signed)
Abnormal PET scan with hypermetabolic pulmonary lesions that are new since March 2019 CT chest that showed clear lungs.  Questionable etiology. Patient is undergoing work-up with oncology for possible CNS lymphoma.  Brain biopsy was nondiagnostic.  Patient now with cough worsening shortness of breath and pulmonary lesions We will treat for possible underlying infectious process.  Check labs with sed rate CBC be met BNP and d-dimer. Check chest x-ray today On return if not improving consider ANCA labs to r/o [facility]   Plan  Patient Instructions  Chest xray today  Begin Augmentin 869m Twice daily  For 1 week  Begin Oxygen 2l/m with activity.  Begin ANORO 1 puff daily rinse after use.  Follow up in 1 week and As needed   Please contact office for sooner follow up if symptoms do not improve or worsen or seek emergency care

## 2017-09-15 NOTE — Assessment & Plan Note (Signed)
Chronic respiratory failure on home oxygen for several years.  At nighttime.  Patient now with mild desaturation with ambulation.   Begin Oxygen 2 L with activity. follow up in 1 week .

## 2017-09-16 ENCOUNTER — Other Ambulatory Visit: Payer: Self-pay | Admitting: Adult Health

## 2017-09-16 ENCOUNTER — Ambulatory Visit (HOSPITAL_COMMUNITY)
Admission: RE | Admit: 2017-09-16 | Discharge: 2017-09-16 | Disposition: A | Discharge status: Home / Self Care | Payer: Medicare Other | Source: Ambulatory Visit | Attending: Adult Health | Admitting: Adult Health

## 2017-09-16 DIAGNOSIS — R0602 Shortness of breath: Secondary | ICD-10-CM | POA: Diagnosis present

## 2017-09-16 DIAGNOSIS — I517 Cardiomegaly: Secondary | ICD-10-CM | POA: Diagnosis not present

## 2017-09-16 DIAGNOSIS — J439 Emphysema, unspecified: Secondary | ICD-10-CM | POA: Diagnosis not present

## 2017-09-16 DIAGNOSIS — R918 Other nonspecific abnormal finding of lung field: Secondary | ICD-10-CM | POA: Insufficient documentation

## 2017-09-16 DIAGNOSIS — R590 Localized enlarged lymph nodes: Secondary | ICD-10-CM | POA: Diagnosis not present

## 2017-09-16 LAB — D-DIMER, QUANTITATIVE: D-Dimer, Quant: 1.11 mcg/mL FEU — ABNORMAL HIGH (ref <0.50)

## 2017-09-16 LAB — BASIC METABOLIC PANEL
BUN: 10 mg/dL (ref 6–23)
CALCIUM: 9.3 mg/dL (ref 8.4–10.5)
CO2: 34 mEq/L — ABNORMAL HIGH (ref 19–32)
CREATININE: 0.82 mg/dL (ref 0.40–1.50)
Chloride: 98 mEq/L (ref 96–112)
GFR: 101.49 mL/min (ref >60.00)
GLUCOSE: 134 mg/dL — AB (ref 70–99)
Potassium: 4.3 mEq/L (ref 3.5–5.1)
Sodium: 137 mEq/L (ref 135–145)

## 2017-09-16 LAB — BRAIN NATRIURETIC PEPTIDE: Pro B Natriuretic peptide (BNP): 49 pg/mL (ref 0.0–100.0)

## 2017-09-16 MED ORDER — IOPAMIDOL (ISOVUE-370) INJECTION 76%
100.0000 mL | Freq: Once | INTRAVENOUS | Status: AC | PRN
  Administered 2017-09-16: 100 mL via INTRAVENOUS

## 2017-09-16 MED ORDER — IOPAMIDOL (ISOVUE-370) INJECTION 76%
INTRAVENOUS | Status: AC
  Filled 2017-09-16: qty 100

## 2017-09-16 NOTE — Progress Notes (Signed)
Result Notes for D-Dimer, Quantitative   Notes recorded by Rinaldo Ratel, CMA on 09/16/2017 at 11:59 AM EDT Orders only encounter created for STAT CT Angio Pt is aware of results and pending CTA  ------  Notes recorded by Melvenia Needles, NP on 09/16/2017 at 9:41 AM EDT Please call lab to see if bmet is ready , if scr is okay  Please order a CTa Chest PE protocol .  ESR is elevated , will await CT chest

## 2017-09-16 NOTE — Progress Notes (Signed)
Chart and office note reviewed in detail  > agree with a/p as outlined  Though his f/v loop was not typical of copd and upper airway obstruction will need to be considered going forward

## 2017-09-16 NOTE — Progress Notes (Signed)
Orders only encounter created for STAT CT Angio Pt is aware of results and pending CTA

## 2017-09-16 NOTE — Progress Notes (Signed)
Called spoke with patient, advised of CT Angio results / recs as stated by TP.  Pt verbalized understanding and denied any questions.  Pt will keep the 09/24/17 appt with MW and call the office if symptoms do not improve or they worsen.

## 2017-09-24 ENCOUNTER — Ambulatory Visit (INDEPENDENT_AMBULATORY_CARE_PROVIDER_SITE_OTHER): Payer: Medicare Other | Admitting: Internal Medicine

## 2017-09-24 ENCOUNTER — Ambulatory Visit (INDEPENDENT_AMBULATORY_CARE_PROVIDER_SITE_OTHER)
Admission: RE | Admit: 2017-09-24 | Discharge: 2017-09-24 | Disposition: A | Discharge status: Home / Self Care | Payer: Medicare Other | Source: Ambulatory Visit | Attending: Internal Medicine | Admitting: Internal Medicine

## 2017-09-24 ENCOUNTER — Encounter: Payer: Self-pay | Admitting: Internal Medicine

## 2017-09-24 VITALS — BP 118/68 | HR 60 | Ht 75.0 in | Wt 251.0 lb

## 2017-09-24 DIAGNOSIS — I10 Essential (primary) hypertension: Secondary | ICD-10-CM | POA: Diagnosis not present

## 2017-09-24 DIAGNOSIS — R0609 Other forms of dyspnea: Secondary | ICD-10-CM

## 2017-09-24 DIAGNOSIS — J449 Chronic obstructive pulmonary disease, unspecified: Secondary | ICD-10-CM | POA: Diagnosis not present

## 2017-09-24 DIAGNOSIS — J9611 Chronic respiratory failure with hypoxia: Secondary | ICD-10-CM

## 2017-09-24 LAB — PULMONARY FUNCTION TEST
DL/VA % pred: 64 %
DL/VA: 3.17 ml/min/mmHg/L
DLCO UNC: 19.13 ml/min/mmHg
DLCO cor % pred: 49 %
DLCO cor: 19.53 ml/min/mmHg
DLCO unc % pred: 48 %
FEF 25-75 Post: 1.44 L/sec
FEF 25-75 Pre: 0.82 L/sec
FEF2575-%CHANGE-POST: 76 %
FEF2575-%PRED-POST: 42 %
FEF2575-%Pred-Pre: 24 %
FEV1-%CHANGE-POST: 23 %
FEV1-%PRED-POST: 49 %
FEV1-%PRED-PRE: 39 %
FEV1-PRE: 1.68 L
FEV1-Post: 2.08 L
FEV1FVC-%CHANGE-POST: 6 %
FEV1FVC-%Pred-Pre: 67 %
FEV6-%Change-Post: 17 %
FEV6-%PRED-POST: 69 %
FEV6-%PRED-PRE: 59 %
FEV6-PRE: 3.19 L
FEV6-Post: 3.74 L
FEV6FVC-%CHANGE-POST: 2 %
FEV6FVC-%PRED-PRE: 99 %
FEV6FVC-%Pred-Post: 101 %
FVC-%Change-Post: 16 %
FVC-%Pred-Post: 69 %
FVC-%Pred-Pre: 59 %
FVC-Post: 3.89 L
FVC-Pre: 3.35 L
POST FEV1/FVC RATIO: 54 %
PRE FEV6/FVC RATIO: 95 %
Post FEV6/FVC ratio: 97 %
Pre FEV1/FVC ratio: 50 %

## 2017-09-24 MED ORDER — BUDESONIDE-FORMOTEROL FUMARATE 160-4.5 MCG/ACT IN AERO: 2.0000 | INHALATION_SPRAY | Freq: Two times a day (BID) | RESPIRATORY_TRACT | 0 refills | Status: DC

## 2017-09-24 MED ORDER — BUDESONIDE-FORMOTEROL FUMARATE 160-4.5 MCG/ACT IN AERO: INHALATION_SPRAY | RESPIRATORY_TRACT | 12 refills | Status: DC

## 2017-09-24 NOTE — Progress Notes (Signed)
Subjective:     Patient ID: Tim [name], male   DOB: [birth year],     MRN: [id number]    Brief patient profile:  33 yowm quit smoking 2013  With onset 2011 cough / shortness of breath placed on 02 2011 at hs (with reported  neg sleep study) and better p quit smoking > on just as needed saba and worse cough/sob again since March 2019 assoc hoarseness and aphasia > CT/ MRI ? Lymphoma and needs brain bx August 30 2017  So referred to pulmonary clinic 08/24/2017 by Dr   Mickeal Skinner   History of Present Illness  08/24/2017 1st [facility] office visit/ Tim [name]   Chief Complaint  Patient presents with  . Pulm Consult    Referred by Dr. Mickeal Skinner for surgical clearance for brain tumor operation. Former smoker, quit 7 years ago. Was a smoker for 44 years. Was diagnosed with severe airways restriction. Uses  2L of O2.    doe :   p 10 min slow pace / steps also poorly tol due to sob and does one flight and stops at top now  Sleep on  side either one  No excess mucus  rec Stop lisinopril immediately  Start diovan 160 mg one daily in place of lisinopril and call if problems acquiring it Pantoprazole (protonix) 40 mg   Take  30-60 min before first meal of the day and Pepcid (famotidine)  20 mg one @  bedtime until return to office - this is the best way to tell whether stomach acid is contributing to your problem.   GERD   You are cleared for surgery   Stopped dex 419/19 and surgery on 08/30/17 and on day of surgery more cough clear raspy mostly raspy   09/15/17 rec Chest xray today  Begin Augmentin 875mg  Twice daily  For 1 week  Begin Oxygen 2l/m with activity.  Begin ANORO 1 puff daily rinse after use.  Follow up in 1 week and As needed        09/24/2017  f/u ov/Tim [name] re:  Copd GOLD III with marked reversibility / hbp off acei and on arb and anoro last dose 09/23/17 Chief Complaint  Patient presents with  . Shortness of Breath    Breathing is unchanged since last OV. Pt is still having issues  with DOE. Denies chest tightness, wheezing or coughing at this time.   Dyspnea: MMRC2 = can't walk a nl pace on a flat grade s sob but does fine slow and flat - not using 02 with activity  Cough: gone completely off acei  Sleep: on side horizontal / 02 3lpm hs x years  SABA use:  None    No obvious day to day or daytime variability or assoc excess/ purulent sputum or mucus plugs or hemoptysis or cp or chest tightness, subjective wheeze or overt sinus or hb symptoms. No unusual exposure hx or h/o childhood pna/ asthma or knowledge of premature birth.  Sleeping  On side / 3lpm   without nocturnal  or early am exacerbation  of respiratory  c/o's or need for noct saba. Also denies any obvious fluctuation of symptoms with weather or environmental changes or other aggravating or alleviating factors except as outlined above   Current Allergies, Complete Past Medical History, Past Surgical History, Family History, and Social History were reviewed in [employer] record.  ROS  The following are not active complaints unless bolded Hoarseness, sore throat, dysphagia, dental problems, itching, sneezing,  nasal congestion or discharge of excess mucus or purulent secretions, ear ache,   fever, chills, sweats, unintended wt loss off dex or wt gain, classically pleuritic or exertional cp,  orthopnea pnd or arm/hand swelling  or leg swelling, presyncope, palpitations, abdominal pain, anorexia, nausea, vomiting, diarrhea  or change in bowel habits or change in bladder habits, change in stools or change in urine, dysuria, hematuria,  rash, arthralgias, visual complaints, headache, numbness, weakness or ataxia or problems with walking or coordination,  change in mood or  memory.        Current Meds  Medication Sig  . acetaminophen (TYLENOL) 500 MG tablet Take 1,000 mg by mouth every 8 (eight) hours as needed.  Marland Kitchen albuterol (PROVENTIL HFA;VENTOLIN HFA) 108 (90 Base) MCG/ACT inhaler Inhale 2 puffs  into the lungs every 4 (four) hours as needed for wheezing or shortness of breath.  . allopurinol (ZYLOPRIM) 300 MG tablet Take 300 mg by mouth daily.  Marland Kitchen atenolol (TENORMIN) 100 MG tablet Take 50 mg by mouth daily.  Marland Kitchen b complex vitamins tablet Take 1 tablet by mouth daily.  . Multiple Vitamin (MULTIVITAMIN) tablet Take 1 tablet by mouth daily.  . valsartan (DIOVAN) 160 MG tablet Take 1 tablet (160 mg total) by mouth daily.  . [DISCONTINUED] umeclidinium-vilanterol (ANORO ELLIPTA) 62.5-25 MCG/INH AEPB Inhale 1 puff into the lungs daily.                 Objective:   Physical Exam    Very hoarse wm nad   09/24/2017        251    08/24/17 258 lb 12.8 oz (117.4 kg)  08/23/17 261 lb 4.8 oz (118.5 kg)  08/02/17 263 lb (119.3 kg)    Vital signs reviewed - Note on arrival 02 sats  97% on RA      HEENT: nl dentition, turbinates bilaterally, and oropharynx. Nl external ear canals without cough reflex   NECK :  without JVD/Nodes/TM/ nl carotid upstrokes bilaterally   LUNGS: no acc muscle use,  Nl contour chest which is clear to A and P bilaterally without cough on insp or exp maneuvers   CV:  RRR  no s3 or murmur or increase in P2, and no edema   ABD:  soft and nontender with nl inspiratory excursion in the supine position. No bruits or organomegaly appreciated, bowel sounds nl  MS:  Nl gait/ ext warm without deformities, calf tenderness, cyanosis or clubbing No obvious joint restrictions   SKIN: warm and dry without lesions    NEURO:  alert, approp, nl sensorium with  no motor or cerebellar deficits apparent.       CXR PA and Lateral:   09/24/2017 :    I personally reviewed images and agree with radiology impression as follows:    COPD changes with probable bibasilar atelectasis. My review :  Acute changes resolved p abx          Assessment:

## 2017-09-24 NOTE — Progress Notes (Signed)
PFT completed today.  

## 2017-09-24 NOTE — Patient Instructions (Signed)
Plan A = Automatic = symbicort 160 Take 2 puffs first thing in am and then another 2 puffs about 12 hours later.   Work on inhaler technique:  relax and gently blow all the way out then take a nice smooth deep breath back in, triggering the inhaler at same time you start breathing in.  Hold for up to 5 seconds if you can. Blow out thru nose. Rinse and gargle with water when done      GoodRx App is useful for cheapest cash medications     Plan B = Backup Only use your albuterol as a rescue medication to be used if you can't catch your breath by resting or doing a relaxed purse lip breathing pattern.  - The less you use it, the better it will work when you need it. - Ok to use the inhaler up to 2 puffs  every 4 hours if you must but call for appointment if use goes up over your usual need - Don't leave home without it !!  (think of it like the spare tire for your car)      Please remember to go to the  x-ray department downstairs in the basement  for your tests - we will call you with the results when they are available.      Please schedule a follow up office visit in 6 weeks, call sooner if needed with all medications /inhalers/ solutions in hand so we can verify exactly what you are taking. This includes all medications from all doctors and over the counters

## 2017-09-25 ENCOUNTER — Encounter: Payer: Self-pay | Admitting: Internal Medicine

## 2017-09-25 NOTE — Assessment & Plan Note (Signed)
Try off acei 08/24/2017 due to pseudowheeze>  Marked clinical improvement 09/24/2017    Although even in retrospect it may not be clear the ACEi contributed to the pt's symptoms,  Pt improved off them and adding them back at this point or in the future would risk confusion in interpretation of non-specific respiratory symptoms to which this patient is prone  ie  Better not to muddy the waters here.   Continue diovan 160 mg daily

## 2017-09-25 NOTE — Assessment & Plan Note (Addendum)
Quit smoking 2013 Spirometry 08/24/2017  FEV1 1.68 (40%)  Ratio 53 with flattened early portion of f/v loop on acei  - trial off acei 08/24/2017  - PFT's  09/24/2017  FEV1  2.08 (49 % ) ratio 54  p 23 % improvement from saba p nothing prior to study with DLCO  48/49c % corrects to 64  % for alv volume    - 09/24/2017  After extensive coaching inhaler device  effectiveness =    75% > try symbicort 160 2bid   Marked reversibility (400 cc in this case)  suggests an asthmatic component so will rx this first with symbicort and see to what extent there is clinical improvement with note that he has has no insurance for meds so need to pick the most cost effective means of treating him and samples provided   I had an extended discussion with the patient reviewing all relevant studies completed to date and  lasting 15 to 20 minutes of a 25 minute visit    Each maintenance medication was reviewed in detail including most importantly the difference between maintenance and prns and under what circumstances the prns are to be triggered using an action plan format that is not reflected in the computer generated alphabetically organized AVS.    See device teaching which extended face to face time for this visit   Please see AVS for specific instructions unique to this visit that I personally wrote and verbalized to the the pt in detail and then reviewed with pt  by my nurse highlighting any  changes in therapy recommended at today's visit to their plan of care.

## 2017-09-25 NOTE — Assessment & Plan Note (Signed)
See copd  

## 2017-09-25 NOTE — Assessment & Plan Note (Signed)
Just using 02 hs now

## 2017-09-27 ENCOUNTER — Telehealth: Payer: Self-pay | Admitting: Internal Medicine

## 2017-09-27 NOTE — Progress Notes (Signed)
LMTCB

## 2017-09-27 NOTE — Telephone Encounter (Signed)
   Notes recorded by Tanda Rockers, MD on 09/24/2017 at 5:16 PM EDT Call pt: Reviewed cxr and no acute change so no change in recommendations made at [facility]       Advised pt of results. Pt understood and nothing further is needed.

## 2017-10-05 ENCOUNTER — Other Ambulatory Visit: Payer: Self-pay | Admitting: *Deleted

## 2017-10-05 DIAGNOSIS — C8589 Other specified types of non-Hodgkin lymphoma, extranodal and solid organ sites: Secondary | ICD-10-CM

## 2017-10-05 DIAGNOSIS — C8339 Primary central nervous system lymphoma: Secondary | ICD-10-CM

## 2017-11-05 ENCOUNTER — Ambulatory Visit: Payer: Medicare Other | Admitting: Internal Medicine

## 2017-11-15 ENCOUNTER — Other Ambulatory Visit: Payer: Self-pay | Admitting: *Deleted

## 2017-11-15 ENCOUNTER — Ambulatory Visit
Admission: RE | Admit: 2017-11-15 | Discharge: 2017-11-15 | Disposition: A | Discharge status: Home / Self Care | Payer: Self-pay | Source: Ambulatory Visit | Attending: Internal Medicine | Admitting: Internal Medicine

## 2017-11-15 DIAGNOSIS — C8589 Other specified types of non-Hodgkin lymphoma, extranodal and solid organ sites: Secondary | ICD-10-CM

## 2017-11-16 ENCOUNTER — Other Ambulatory Visit: Payer: Self-pay | Admitting: *Deleted

## 2017-11-16 ENCOUNTER — Ambulatory Visit
Admission: RE | Admit: 2017-11-16 | Discharge: 2017-11-16 | Disposition: A | Discharge status: Home / Self Care | Payer: Self-pay | Source: Ambulatory Visit | Attending: Internal Medicine | Admitting: Internal Medicine

## 2017-11-16 DIAGNOSIS — C8589 Other specified types of non-Hodgkin lymphoma, extranodal and solid organ sites: Secondary | ICD-10-CM

## 2017-11-18 ENCOUNTER — Inpatient Hospital Stay: Payer: Medicare Other | Attending: Internal Medicine | Admitting: Internal Medicine

## 2017-11-18 ENCOUNTER — Inpatient Hospital Stay: Payer: Medicare Other

## 2017-11-18 ENCOUNTER — Encounter: Payer: Self-pay | Admitting: Internal Medicine

## 2017-11-18 VITALS — BP 135/68 | HR 57 | Temp 97.7°F | Resp 18 | Ht 75.0 in | Wt 252.8 lb

## 2017-11-18 DIAGNOSIS — Z79899 Other long term (current) drug therapy: Secondary | ICD-10-CM | POA: Insufficient documentation

## 2017-11-18 DIAGNOSIS — R531 Weakness: Secondary | ICD-10-CM | POA: Insufficient documentation

## 2017-11-18 DIAGNOSIS — R739 Hyperglycemia, unspecified: Secondary | ICD-10-CM | POA: Insufficient documentation

## 2017-11-18 DIAGNOSIS — Z87891 Personal history of nicotine dependence: Secondary | ICD-10-CM

## 2017-11-18 DIAGNOSIS — C8589 Other specified types of non-Hodgkin lymphoma, extranodal and solid organ sites: Secondary | ICD-10-CM | POA: Diagnosis present

## 2017-11-18 DIAGNOSIS — G4730 Sleep apnea, unspecified: Secondary | ICD-10-CM | POA: Insufficient documentation

## 2017-11-18 DIAGNOSIS — C8339 Primary central nervous system lymphoma: Secondary | ICD-10-CM | POA: Insufficient documentation

## 2017-11-18 DIAGNOSIS — R569 Unspecified convulsions: Secondary | ICD-10-CM | POA: Insufficient documentation

## 2017-11-18 LAB — CREATININE (CANCER CENTER ONLY)
CREATININE: 1.03 mg/dL (ref 0.61–1.24)
GFR, Est AFR Am: >60 mL/min (ref >60)
GFR, Estimated: >60 mL/min (ref >60)

## 2017-11-18 MED ORDER — LEVETIRACETAM 500 MG PO TABS: 500.0000 mg | ORAL_TABLET | Freq: Two times a day (BID) | ORAL | 5 refills | Status: DC

## 2017-11-18 NOTE — Progress Notes (Signed)
[facility] [place], [name] [address] [phone]   Interval Evaluation  Date of Service: 11/18/17 Patient Name: Tim [name] Patient MRN: [id number] Patient DOB: 1956-01-17 Provider: Ventura Sellers, MD  Identifying Statement:  Tim [name] is a 62 y.o. male with left frontal CNS lymphoma  Interval History:  ITZAE MIRALLES presents for follow up after repeat brain biopsy with Dr. Tommi Rumps at [facility].  He had presented with several days of progressive double vision and right sided weakness limiting his ability to walk and write with the right hand.  He also described impaired visual field on the left side.  In addition, he had an episode of involuntary shaking of his right arm and leg followed by post-event weakness.  After MRI demonstrated multiply recurring tumors, biopsy was performed and dexamethasone restarted.  Steroids dramatically improved his symptoms, visual problems resolved completely, motor deficits mostly resolved.  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  He presents today to review treatment options and workup moving forward.  Medications: Current Outpatient Medications on File Prior to Visit  Medication Sig Dispense Refill  . acetaminophen (TYLENOL) 500 MG tablet Take 1,000 mg by mouth every 8 (eight) hours as needed.    Marland Kitchen albuterol (PROVENTIL HFA;VENTOLIN HFA) 108 (90 Base) MCG/ACT inhaler Inhale 2 puffs into the lungs every 4 (four) hours as needed for wheezing or shortness of breath.    . allopurinol (ZYLOPRIM) 300 MG tablet Take 300 mg by mouth daily.    Marland Kitchen atenolol (TENORMIN) 100 MG tablet Take 50 mg by mouth daily.    Marland Kitchen b complex vitamins tablet Take 1 tablet by mouth daily.    . budesonide-formoterol (SYMBICORT) 160-4.5 MCG/ACT inhaler Take 2 puffs first thing in am and then another 2 puffs about 12 hours later. 1 Inhaler 12  . Multiple Vitamin (MULTIVITAMIN) tablet Take 1 tablet by  mouth daily.    . valsartan (DIOVAN) 160 MG tablet Take 1 tablet (160 mg total) by mouth daily. 30 tablet 11   No current facility-administered medications on file prior to visit.     Allergies: No Known Allergies Past Medical History:  Past Medical History:  Diagnosis Date  . Arthritis   . Brain tumor ([place])   . COPD (chronic obstructive pulmonary disease) ([place])   . Diabetes mellitus without complication [facility])    patient states he was taken off meds at last appointment with PCP and is diet controlled  . Dyspnea   . Gout   . Hypertension 08/02/2017  . Pneumonia    Past Surgical History: none    Social History:  Social History   Socioeconomic History  . Marital status: Significant Other    Spouse name: [name]  . Number of children: Not on file  . Years of education: 24  . Highest education level: Bachelor's degree (e.g., BA, AB, BS)  Occupational History  . 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 Financial resource strain: Not hard at all  . Food insecurity:    Worry: Never true    Inability: Never true  . Transportation needs:    Medical: No    Non-medical: No  Tobacco Use  . Smoking status: Former Smoker    Packs/day: 2.00    Years: 40.00    Pack years: 80.00    Types: Cigarettes    Last attempt to quit: 08/10/2011  Years since quitting: 6.2  . Smokeless tobacco: Never Used  Substance and Sexual Activity  . Alcohol use: Yes    Alcohol/week: 8.4 oz    Types: 14 Cans of beer per week    Frequency: Never  . Drug use: Never  . Sexual activity: Not Currently  Lifestyle  . Physical activity:    Days per week: 0 days    Minutes per session: 0 min  . Stress: Only a little  Relationships  . Social connections:    Talks on phone: Once a week    Gets together: Once a week    Attends religious service: Never    Active member of club or organization: No    Attends meetings of clubs or organizations: Never    Relationship  status: Living with partner  . Intimate partner violence:    Fear of current or ex partner: No    Emotionally abused: No    Physically abused: No    Forced sexual activity: No  Other Topics Concern  . Not on file  Social History Narrative  . Not on file   Family History:  Family History  Problem Relation Age of Onset  . Cancer Brother     Review of Systems: Constitutional: Denies fevers, chills or abnormal weight loss Eyes: Denies blurriness of vision Ears, nose, mouth, throat, and face: Denies mucositis or sore throat Respiratory: chronic hoarseness Cardiovascular: Denies palpitation, chest discomfort or lower extremity swelling Gastrointestinal:  Denies nausea, constipation, diarrhea GU: Denies dysuria or incontinence Skin: Denies abnormal skin rashes Neurological: Per HPI Musculoskeletal: Denies joint pain, back or neck discomfort. No decrease in ROM Behavioral/Psych: Denies anxiety, disturbance in thought content, and mood instability  Physical Exam: Vitals:   11/18/17 0913  BP: 135/68  Pulse: (!) 57  Resp: 18  Temp: 97.7 F (36.5 C)  SpO2: 95%   KPS: 80. General: Alert, cooperative, pleasant, in no acute distress Head: Biopsy scar noted, dry and intact. EENT: No conjunctival injection or scleral icterus. Oral mucosa moist Lungs: Resp effort normal Cardiac: Regular rate and rhythm Abdomen: Soft, non-distended abdomen Skin: No rashes cyanosis or petechiae. Extremities: No clubbing or edema  Neurologic Exam: Mental Status: Awake, alert, attentive to examiner. Oriented to self and environment. Language fluent with intact comprehension, repetition, reading. Cranial Nerves: Visual acuity is grossly normal. Visual fields are full. Extra-ocular movements intact. No ptosis. Face is symmetric, tongue midline. Motor: Tone and bulk are normal. Power 4+/5 in right arm and leg. Reflexes are symmetric, no pathologic reflexes present. Intact finger to nose  bilaterally Sensory: Intact to light touch and temperature Gait: Normal, independent   Labs: I have reviewed the data as listed    Component Value Date/Time   NA 137 09/15/2017 1730   K 4.3 09/15/2017 1730   CL 98 09/15/2017 1730   CO2 34 (H) 09/15/2017 1730   GLUCOSE 134 (H) 09/15/2017 1730   BUN 10 09/15/2017 1730   CREATININE 0.82 09/15/2017 1730   CALCIUM 9.3 09/15/2017 1730   GFRNONAA >60 08/23/2017 1502   GFRAA >60 08/23/2017 1502   Lab Results  Component Value Date   WBC 7.5 09/15/2017   NEUTROABS 3.9 09/15/2017   HGB 13.9 09/15/2017   HCT 41.4 09/15/2017   MCV 95.6 09/15/2017   PLT 377.0 09/15/2017     Imaging:  CLINICAL DATA:  Initial treatment strategy for CNS lymphoma.  EXAM: NUCLEAR MEDICINE PET SKULL BASE TO THIGH  TECHNIQUE: 13.5 mCi F-18 FDG was injected intravenously.  Full-ring PET imaging was performed from the skull base to thigh after the radiotracer. CT data was obtained and used for attenuation correction and anatomic localization.  Fasting blood glucose: 141 mg/dl  COMPARISON:  CT scans 08/04/2017 and MRI brain 08/16/2017.  FINDINGS: Mediastinal blood pool activity: SUV max 2.85  NECK: No hypermetabolic lymph nodes in the neck.  Incidental CT findings: none  CHEST: Numerous ill-defined hazy pulmonary lesions are hypermetabolic. Findings most consistent with some type of inflammatory or infectious lung disease or possibly drug reaction. There also hypermetabolic hilar and mediastinal nodes. These are not enlarged however and again I think these are probably reactive. This does not look like lymphoma in the lungs and the chest CT from 08/04/2017 was relatively normal.  Incidental CT findings: none  ABDOMEN/PELVIS: No abnormal hypermetabolic activity within the liver, pancreas, adrenal glands, or spleen. No hypermetabolic lymph nodes in the abdomen or pelvis.  Incidental CT findings: The liver and spleen are normal in  size.  SKELETON: No focal hypermetabolic activity to suggest skeletal metastasis.  Incidental CT findings: none  IMPRESSION: 1. Numerous pulmonary lesions which are hypermetabolic along with hypermetabolic small mediastinal and hilar nodes. Findings most likely due to an inflammatory or infectious pulmonary process or possible drug reaction. Pulmonary lymphoma is highly unlikely. Recommend correlation with clinical findings and appropriate treatment. A short-term follow-up chest CT may be helpful (2-3 months). 2. No findings for lymphoma Nissen vomit of the abdomen/pelvis or skeletal structures. No axillary or inguinal adenopathy.   Electronically Signed   By: Marijo Sanes M.D.   On: 09/10/2017 17:09  [facility] Clinician Interpretation: I have personally reviewed the MRI brain with and without contrast from outside institution dated 11/09/17.  My interpretation, in the context of the patient's clinical presentation, is progressive disease  Pathology   Assessment/Plan  CNS lymphoma ([place]) - Plan: MR TOTAL SPINE METS SCREENING, US SCROTUM W/DOPPLER, Creatinine ([place] Only), CANCELED: Biopsy bone marrow, CANCELED: US SCROTUM  Primary CNS lymphoma ([place]) - Plan: Biopsy bone marrow, CBC with Differential ([facility] Only), CMP ([place] only)   Mr. 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  Fortunately, resumption of steroids has had considerable clinical benefit.  We discussed next steps, which include full systemic staging.  Already completed are whole body PET, slit lamp exam, and LP.  Still pending: -Bone marrow biopsy -Spine MRI (total) -Testicular ultrasound  While staging is in process, we will plan for induction therapy with high dose methotrexate with rituximab and temozolomide, as discussed with the patient.  The goal of therapy will be complete response.  We discussed the method of delivery which  will be inpatient administration for 3.5g/m2 MTX every 2-3 weeks for 6 cycles, with leucovorin rescue q6 hours after 24 hours.  Rituximab will be administered on day 3 of each cycle. Temozolomide will be administered at 140m/m2 for 5 days in one 28 day cycle following cycle 3 of MTX, with plan for further cycles of 5-day Temozolomide as consolidation if complete response is obtained.  We will target initiation of therapy by the end of the month.  We recommended he reduce decadron to 447mdaily if tolerated.  He should call us[place]orea[place] any new or progressive neurologic deficits.  Ordered 50029mID Keppra given focal seizure this month.  Counseled on seizure safety and precautions.  We appreciate the opportunity to participate in the care of ChrSHOLOM DULUDE Follow up at this time  is TBD based on workup above.  All questions were answered. The patient knows to call the clinic with any problems, questions or concerns. No barriers to learning were detected.  The total time spent in the encounter was 45 minutes and more than 50% was on counseling and review of test results   Ventura Sellers, MD Medical Director of Neuro-Oncology [facility] [place] 11/18/17 9:08 AM

## 2017-11-18 NOTE — Progress Notes (Signed)
START ON PATHWAY REGIMEN - Neuro     A cycle is every 28 days:     Rituximab      Methotrexate      Leucovorin      Vincristine      Procarbazine      Filgrastim-xxxx   **Always confirm dose/schedule in your pharmacy ordering system**  Patient Characteristics: Primary CNS Lymphoma, Newly Diagnosed, Induction, Candidate for High-Dose Methotrexate Disease Classification: Primary CNS Lymphoma Disease Status: Newly Diagnosed  Intent of Therapy: Curative Intent, Discussed with Patient

## 2017-11-18 NOTE — Progress Notes (Signed)
START OFF PATHWAY REGIMEN - Neuro   [id number] (HD Methotrexate D1,15 + Rituximab Weekly (x 6) + Temozolomide Daily D7-11) q28 Days (Induction):   A cycle is every 28 days:     Methotrexate      Leucovorin      Rituximab      Rituximab      Temozolomide   **Always confirm dose/schedule in your pharmacy ordering system**  Patient Characteristics: Primary CNS Lymphoma, Newly Diagnosed, Induction, Candidate for High-Dose Methotrexate Disease Classification: Primary CNS Lymphoma Disease Status: Newly Diagnosed  Intent of Therapy: Curative Intent, Discussed with Patient

## 2017-11-19 ENCOUNTER — Telehealth: Payer: Self-pay

## 2017-11-19 NOTE — Telephone Encounter (Signed)
Per 7/11 no los 

## 2017-11-22 ENCOUNTER — Telehealth: Payer: Self-pay | Admitting: *Deleted

## 2017-11-22 NOTE — Telephone Encounter (Signed)
Patient called concerned about recent sore throat that started since starting Keppra.  Per Dr Mickeal Skinner no concern for side effect at this time possible viral.  Advised patient to call if continues or worsens.  Spoke with significant other [name], she expressed understanding, no further questions at this time.

## 2017-11-25 ENCOUNTER — Ambulatory Visit (HOSPITAL_COMMUNITY)
Admission: RE | Admit: 2017-11-25 | Discharge: 2017-11-25 | Disposition: A | Discharge status: Home / Self Care | Payer: Medicare Other | Source: Ambulatory Visit | Attending: Internal Medicine | Admitting: Internal Medicine

## 2017-11-25 DIAGNOSIS — M899 Disorder of bone, unspecified: Secondary | ICD-10-CM | POA: Diagnosis not present

## 2017-11-25 DIAGNOSIS — C8589 Other specified types of non-Hodgkin lymphoma, extranodal and solid organ sites: Secondary | ICD-10-CM | POA: Insufficient documentation

## 2017-11-25 MED ORDER — GADOBENATE DIMEGLUMINE 529 MG/ML IV SOLN
20.0000 mL | Freq: Once | INTRAVENOUS | Status: AC | PRN
  Administered 2017-11-25: 20 mL via INTRAVENOUS

## 2017-12-01 ENCOUNTER — Inpatient Hospital Stay (HOSPITAL_BASED_OUTPATIENT_CLINIC_OR_DEPARTMENT_OTHER): Payer: Medicare Other | Admitting: Adult Health

## 2017-12-01 ENCOUNTER — Inpatient Hospital Stay: Payer: Medicare Other

## 2017-12-01 VITALS — BP 138/92 | HR 51 | Temp 98.0°F | Resp 17

## 2017-12-01 DIAGNOSIS — C8339 Primary central nervous system lymphoma: Secondary | ICD-10-CM

## 2017-12-01 DIAGNOSIS — C8589 Other specified types of non-Hodgkin lymphoma, extranodal and solid organ sites: Secondary | ICD-10-CM | POA: Diagnosis present

## 2017-12-01 LAB — CBC WITH DIFFERENTIAL (CANCER CENTER ONLY)
Basophils Absolute: 0 10*3/uL (ref 0.0–0.1)
Basophils Relative: 0 %
EOS ABS: 0 10*3/uL (ref 0.0–0.5)
EOS PCT: 0 %
HCT: 41.4 % (ref 38.4–49.9)
HEMOGLOBIN: 14.1 g/dL (ref 13.0–17.1)
LYMPHS PCT: 14 %
Lymphs Abs: 1.5 10*3/uL (ref 0.9–3.3)
MCH: 32.3 pg (ref 27.2–33.4)
MCHC: 34.1 g/dL (ref 32.0–36.0)
MCV: 94.7 fL (ref 79.3–98.0)
MONOS PCT: 7 %
Monocytes Absolute: 0.8 10*3/uL (ref 0.1–0.9)
NEUTROS PCT: 79 %
Neutro Abs: 8.3 10*3/uL — ABNORMAL HIGH (ref 1.5–6.5)
Platelet Count: 125 10*3/uL — ABNORMAL LOW (ref 140–400)
RBC: 4.37 MIL/uL (ref 4.20–5.82)
RDW: 12.3 % (ref 11.0–14.6)
WBC: 10.5 10*3/uL — AB (ref 4.0–10.3)

## 2017-12-01 MED ORDER — LIDOCAINE HCL 2 % IJ SOLN
INTRAMUSCULAR | Status: AC
  Filled 2017-12-01: qty 20

## 2017-12-01 NOTE — Patient Instructions (Signed)
[place] Discharge Instructions for Post Bone Marrow Procedure  Today you had a bone marrow biopsy and aspirate of the right hip   Please keep the pressure dressing in place for at least 24 hours.  Have someone check your dressing periodically for bleeding.  If needed you can reapply a pressure dressing to the site.  Take pain medication Tylenol, Aleve, etc. as directed.  IF BLEEDING REOCCURS THAT SHOULD BE REPORTED IMMEDIATELY. Call the [place] at [phone] if during business hours. Or report to the Emergency Room.   I have been informed and understand all the instructions given to me. I know to contact the clinic, my physician, or go to the Emergency Department if any problems should occur. I do not have any questions at this time, but understand that I may call the clinic during office hours at [phone]  should I have any questions or need assistance in obtaining follow up care.    __________________________________________  _____________  __________ Signature of Patient or Authorized Representative            Date                   Time    __________________________________________ Nurse's Signature    Bone Marrow Aspiration and Bone Marrow Biopsy, Adult, Care After This sheet gives you information about how to care for yourself after your procedure. Your health care provider may also give you more specific instructions. If you have problems or questions, contact your health care provider. What can I expect after the procedure? After the procedure, it is common to have:  Mild pain and tenderness.  Swelling.  Bruising.  Follow these instructions at home:  Take over-the-counter or prescription medicines only as told by your health care provider.  Do not take baths, swim, or use a hot tub until your health care provider approves. Ask if you can take a shower or have a sponge bath.  Follow instructions from your health care provider about how  to take care of the puncture site. Make sure you: ? Wash your hands with soap and water before you change your bandage (dressing). If soap and water are not available, use hand sanitizer. ? Change your dressing as told by your health care provider.  Check your puncture siteevery day for signs of infection. Check for: ? More redness, swelling, or pain. ? More fluid or blood. ? Warmth. ? Pus or a bad smell.  Return to your normal activities as told by your health care provider. Ask your health care provider what activities are safe for you.  Do not drive for 24 hours if you were given a medicine to help you relax (sedative).  Keep all follow-up visits as told by your health care provider. This is important. Contact a health care provider if:  You have more redness, swelling, or pain around the puncture site.  You have more fluid or blood coming from the puncture site.  Your puncture site feels warm to the touch.  You have pus or a bad smell coming from the puncture site.  You have a fever.  Your pain is not controlled with medicine. This information is not intended to replace advice given to you by your health care provider. Make sure you discuss any questions you have with your health care provider. Document Released: 11/14/2004 Document Revised: 11/15/2015 Document Reviewed: 10/09/2015 Elsevier Interactive Patient Education  2018 [employer].

## 2017-12-01 NOTE — Progress Notes (Signed)
INDICATION: CNS lymphoma staging    Bone Marrow Biopsy and Aspiration Procedure Note   Informed consent was obtained and potential risks including bleeding, infection and pain were reviewed with the patient.  The patient's name, date of birth, identification, consent and allergies were verified prior to the start of procedure and time out was performed.  The left posterior iliac crest was chosen as the site of biopsy.  The skin was prepped with ChloraPrep.   16 cc of 2% lidocaine was used to provide local anaesthesia.   10 cc of bone marrow aspirate was obtained followed by 1cm biopsy.  Pressure was applied to the biopsy site and bandage was placed over the biopsy site. Patient was made to lie on the back for 30 mins prior to discharge.  The procedure was tolerated well. COMPLICATIONS: None BLOOD LOSS: none The patient was discharged home in stable condition with a 1 week follow up with Dr. Mickeal Skinner to review results.  Patient was provided with post bone marrow biopsy instructions and instructed to call if there was any bleeding or worsening pain.  Specimens sent for flow cytometry, cytogenetics and additional studies.  Signed Scot Dock, NP

## 2017-12-02 ENCOUNTER — Telehealth: Payer: Self-pay | Admitting: Adult Health

## 2017-12-02 NOTE — Telephone Encounter (Signed)
Per 7/24 no los °

## 2017-12-07 ENCOUNTER — Inpatient Hospital Stay (HOSPITAL_BASED_OUTPATIENT_CLINIC_OR_DEPARTMENT_OTHER): Payer: Medicare Other | Admitting: Internal Medicine

## 2017-12-07 ENCOUNTER — Encounter: Payer: Self-pay | Admitting: Internal Medicine

## 2017-12-07 ENCOUNTER — Other Ambulatory Visit: Payer: Self-pay

## 2017-12-07 ENCOUNTER — Inpatient Hospital Stay (HOSPITAL_COMMUNITY)
Admission: AD | Admit: 2017-12-07 | Discharge: 2017-12-13 | DRG: 846 | Disposition: A | Discharge status: Home / Self Care | Payer: Medicare Other | Source: Ambulatory Visit | Attending: Internal Medicine | Admitting: Internal Medicine

## 2017-12-07 ENCOUNTER — Encounter (HOSPITAL_COMMUNITY): Payer: Self-pay

## 2017-12-07 ENCOUNTER — Inpatient Hospital Stay: Payer: Medicare Other

## 2017-12-07 VITALS — BP 127/71 | HR 61 | Temp 97.9°F | Resp 18 | Ht 75.0 in | Wt 246.7 lb

## 2017-12-07 DIAGNOSIS — R531 Weakness: Secondary | ICD-10-CM

## 2017-12-07 DIAGNOSIS — G936 Cerebral edema: Secondary | ICD-10-CM | POA: Diagnosis present

## 2017-12-07 DIAGNOSIS — I10 Essential (primary) hypertension: Secondary | ICD-10-CM | POA: Diagnosis present

## 2017-12-07 DIAGNOSIS — G4730 Sleep apnea, unspecified: Secondary | ICD-10-CM | POA: Diagnosis present

## 2017-12-07 DIAGNOSIS — Z5111 Encounter for antineoplastic chemotherapy: Principal | ICD-10-CM

## 2017-12-07 DIAGNOSIS — Z87891 Personal history of nicotine dependence: Secondary | ICD-10-CM

## 2017-12-07 DIAGNOSIS — Z7952 Long term (current) use of systemic steroids: Secondary | ICD-10-CM

## 2017-12-07 DIAGNOSIS — C8589 Other specified types of non-Hodgkin lymphoma, extranodal and solid organ sites: Secondary | ICD-10-CM | POA: Diagnosis not present

## 2017-12-07 DIAGNOSIS — Z809 Family history of malignant neoplasm, unspecified: Secondary | ICD-10-CM

## 2017-12-07 DIAGNOSIS — C8339 Diffuse large B-cell lymphoma, extranodal and solid organ sites: Secondary | ICD-10-CM | POA: Diagnosis present

## 2017-12-07 DIAGNOSIS — T380X5A Adverse effect of glucocorticoids and synthetic analogues, initial encounter: Secondary | ICD-10-CM | POA: Diagnosis present

## 2017-12-07 DIAGNOSIS — D72829 Elevated white blood cell count, unspecified: Secondary | ICD-10-CM | POA: Diagnosis present

## 2017-12-07 DIAGNOSIS — R569 Unspecified convulsions: Secondary | ICD-10-CM | POA: Diagnosis present

## 2017-12-07 DIAGNOSIS — R739 Hyperglycemia, unspecified: Secondary | ICD-10-CM | POA: Diagnosis not present

## 2017-12-07 DIAGNOSIS — M109 Gout, unspecified: Secondary | ICD-10-CM | POA: Diagnosis present

## 2017-12-07 DIAGNOSIS — E876 Hypokalemia: Secondary | ICD-10-CM | POA: Diagnosis present

## 2017-12-07 DIAGNOSIS — D696 Thrombocytopenia, unspecified: Secondary | ICD-10-CM | POA: Diagnosis present

## 2017-12-07 DIAGNOSIS — R600 Localized edema: Secondary | ICD-10-CM | POA: Diagnosis present

## 2017-12-07 DIAGNOSIS — R945 Abnormal results of liver function studies: Secondary | ICD-10-CM | POA: Diagnosis present

## 2017-12-07 DIAGNOSIS — F419 Anxiety disorder, unspecified: Secondary | ICD-10-CM | POA: Diagnosis present

## 2017-12-07 DIAGNOSIS — E873 Alkalosis: Secondary | ICD-10-CM | POA: Diagnosis present

## 2017-12-07 DIAGNOSIS — E871 Hypo-osmolality and hyponatremia: Secondary | ICD-10-CM | POA: Diagnosis present

## 2017-12-07 DIAGNOSIS — N179 Acute kidney failure, unspecified: Secondary | ICD-10-CM | POA: Diagnosis present

## 2017-12-07 DIAGNOSIS — E1165 Type 2 diabetes mellitus with hyperglycemia: Secondary | ICD-10-CM

## 2017-12-07 DIAGNOSIS — D6481 Anemia due to antineoplastic chemotherapy: Secondary | ICD-10-CM | POA: Diagnosis not present

## 2017-12-07 DIAGNOSIS — Z7951 Long term (current) use of inhaled steroids: Secondary | ICD-10-CM

## 2017-12-07 DIAGNOSIS — G40109 Localization-related (focal) (partial) symptomatic epilepsy and epileptic syndromes with simple partial seizures, not intractable, without status epilepticus: Secondary | ICD-10-CM | POA: Diagnosis not present

## 2017-12-07 DIAGNOSIS — I340 Nonrheumatic mitral (valve) insufficiency: Secondary | ICD-10-CM | POA: Diagnosis not present

## 2017-12-07 DIAGNOSIS — G4733 Obstructive sleep apnea (adult) (pediatric): Secondary | ICD-10-CM | POA: Diagnosis not present

## 2017-12-07 DIAGNOSIS — T451X5A Adverse effect of antineoplastic and immunosuppressive drugs, initial encounter: Secondary | ICD-10-CM | POA: Diagnosis not present

## 2017-12-07 DIAGNOSIS — J449 Chronic obstructive pulmonary disease, unspecified: Secondary | ICD-10-CM | POA: Diagnosis present

## 2017-12-07 DIAGNOSIS — Z79899 Other long term (current) drug therapy: Secondary | ICD-10-CM | POA: Diagnosis not present

## 2017-12-07 DIAGNOSIS — E119 Type 2 diabetes mellitus without complications: Secondary | ICD-10-CM

## 2017-12-07 DIAGNOSIS — E878 Other disorders of electrolyte and fluid balance, not elsewhere classified: Secondary | ICD-10-CM | POA: Diagnosis present

## 2017-12-07 DIAGNOSIS — R609 Edema, unspecified: Secondary | ICD-10-CM | POA: Diagnosis not present

## 2017-12-07 LAB — GLUCOSE, RANDOM: Glucose, Bld: 488 mg/dL — ABNORMAL HIGH (ref 70–99)

## 2017-12-07 LAB — URINALYSIS, DIPSTICK ONLY
BILIRUBIN URINE: NEGATIVE
GLUCOSE, UA: 150 mg/dL — AB
Hgb urine dipstick: NEGATIVE
KETONES UR: NEGATIVE mg/dL
LEUKOCYTES UA: NEGATIVE
Nitrite: NEGATIVE
PH: 5 (ref 5.0–8.0)
Protein, ur: NEGATIVE mg/dL
Specific Gravity, Urine: 1.026 (ref 1.005–1.030)

## 2017-12-07 LAB — GLUCOSE, CAPILLARY
Glucose-Capillary: 506 mg/dL (ref 70–99)
Glucose-Capillary: 261 mg/dL — ABNORMAL HIGH (ref 70–99)
Glucose-Capillary: 203 mg/dL — ABNORMAL HIGH (ref 70–99)

## 2017-12-07 LAB — CMP (CANCER CENTER ONLY)
ALK PHOS: 91 U/L (ref 38–126)
ALT: 49 U/L — ABNORMAL HIGH (ref 0–44)
AST: 23 U/L (ref 15–41)
Albumin: 3.3 g/dL — ABNORMAL LOW (ref 3.5–5.0)
Anion gap: 11 (ref 5–15)
BUN: 20 mg/dL (ref 8–23)
CO2: 26 mmol/L (ref 22–32)
CREATININE: 1.19 mg/dL (ref 0.61–1.24)
Calcium: 9.1 mg/dL (ref 8.9–10.3)
Chloride: 95 mmol/L — ABNORMAL LOW (ref 98–111)
Glucose, Bld: 529 mg/dL — ABNORMAL HIGH (ref 70–99)
Potassium: 4.5 mmol/L (ref 3.5–5.1)
Sodium: 132 mmol/L — ABNORMAL LOW (ref 135–145)
TOTAL PROTEIN: 6.5 g/dL (ref 6.5–8.1)
Total Bilirubin: 0.3 mg/dL (ref 0.3–1.2)

## 2017-12-07 LAB — CBC WITH DIFFERENTIAL (CANCER CENTER ONLY)
Basophils Absolute: 0 10*3/uL (ref 0.0–0.1)
Basophils Relative: 0 %
EOS ABS: 0 10*3/uL (ref 0.0–0.5)
Eosinophils Relative: 0 %
HCT: 42.9 % (ref 38.4–49.9)
HEMOGLOBIN: 14.6 g/dL (ref 13.0–17.1)
Lymphocytes Relative: 18 %
Lymphs Abs: 1.8 10*3/uL (ref 0.9–3.3)
MCH: 32.2 pg (ref 27.2–33.4)
MCHC: 34 g/dL (ref 32.0–36.0)
MCV: 94.7 fL (ref 79.3–98.0)
Monocytes Absolute: 0.8 10*3/uL (ref 0.1–0.9)
Monocytes Relative: 8 %
NEUTROS PCT: 74 %
Neutro Abs: 7.4 10*3/uL — ABNORMAL HIGH (ref 1.5–6.5)
Platelet Count: 131 10*3/uL — ABNORMAL LOW (ref 140–400)
RBC: 4.53 MIL/uL (ref 4.20–5.82)
RDW: 12.5 % (ref 11.0–14.6)
WBC: 10 10*3/uL (ref 4.0–10.3)

## 2017-12-07 LAB — URINALYSIS, ROUTINE W REFLEX MICROSCOPIC
Bilirubin Urine: NEGATIVE
KETONES UR: NEGATIVE mg/dL
Nitrite: NEGATIVE
PH: 5 (ref 5.0–8.0)
Protein, ur: NEGATIVE mg/dL
SPECIFIC GRAVITY, URINE: 1.028 (ref 1.005–1.030)

## 2017-12-07 MED ORDER — ONDANSETRON HCL 4 MG PO TABS
4.0000 mg | ORAL_TABLET | Freq: Four times a day (QID) | ORAL | Status: DC | PRN
  Administered 2017-12-12: 4 mg via ORAL
  Filled 2017-12-07 (×2): qty 1

## 2017-12-07 MED ORDER — SODIUM CHLORIDE 0.9% FLUSH
3.0000 mL | Freq: Two times a day (BID) | INTRAVENOUS | Status: DC
  Administered 2017-12-07 – 2017-12-12 (×6): 3 mL via INTRAVENOUS

## 2017-12-07 MED ORDER — LEVETIRACETAM 500 MG PO TABS
500.0000 mg | ORAL_TABLET | Freq: Two times a day (BID) | ORAL | Status: DC
  Administered 2017-12-07 – 2017-12-13 (×12): 500 mg via ORAL
  Filled 2017-12-07 (×12): qty 1

## 2017-12-07 MED ORDER — ADULT MULTIVITAMIN W/MINERALS CH
1.0000 | ORAL_TABLET | Freq: Every day | ORAL | Status: DC
  Administered 2017-12-08 – 2017-12-13 (×6): 1 via ORAL
  Filled 2017-12-07 (×6): qty 1

## 2017-12-07 MED ORDER — SODIUM CHLORIDE 0.9 % IV SOLN: 250.0000 mL | INTRAVENOUS | Status: DC | PRN

## 2017-12-07 MED ORDER — ACETAMINOPHEN 325 MG PO TABS: 650.0000 mg | ORAL_TABLET | Freq: Four times a day (QID) | ORAL | Status: DC | PRN

## 2017-12-07 MED ORDER — DEXAMETHASONE 4 MG PO TABS
4.0000 mg | ORAL_TABLET | Freq: Two times a day (BID) | ORAL | Status: DC
  Administered 2017-12-07 – 2017-12-08 (×2): 4 mg via ORAL
  Filled 2017-12-07 (×2): qty 1

## 2017-12-07 MED ORDER — IRBESARTAN 150 MG PO TABS
150.0000 mg | ORAL_TABLET | Freq: Every day | ORAL | Status: DC
  Administered 2017-12-08 – 2017-12-13 (×6): 150 mg via ORAL
  Filled 2017-12-07 (×6): qty 1

## 2017-12-07 MED ORDER — FLUTICASONE FUROATE-VILANTEROL 200-25 MCG/INH IN AEPB
1.0000 | INHALATION_SPRAY | Freq: Every day | RESPIRATORY_TRACT | Status: DC
  Administered 2017-12-08 – 2017-12-13 (×6): 1 via RESPIRATORY_TRACT
  Filled 2017-12-07: qty 28

## 2017-12-07 MED ORDER — ALBUTEROL SULFATE (2.5 MG/3ML) 0.083% IN NEBU: 2.5000 mg | INHALATION_SOLUTION | RESPIRATORY_TRACT | Status: DC | PRN

## 2017-12-07 MED ORDER — INSULIN ASPART 100 UNIT/ML ~~LOC~~ SOLN
15.0000 [IU] | Freq: Once | SUBCUTANEOUS | Status: AC
  Administered 2017-12-07: 15 [IU] via SUBCUTANEOUS

## 2017-12-07 MED ORDER — ATENOLOL 50 MG PO TABS
50.0000 mg | ORAL_TABLET | Freq: Every day | ORAL | Status: DC
  Administered 2017-12-08 – 2017-12-13 (×6): 50 mg via ORAL
  Filled 2017-12-07 (×6): qty 1

## 2017-12-07 MED ORDER — ONDANSETRON HCL 4 MG/2ML IJ SOLN: 4.0000 mg | Freq: Four times a day (QID) | INTRAMUSCULAR | Status: DC | PRN

## 2017-12-07 MED ORDER — INSULIN ASPART 100 UNIT/ML ~~LOC~~ SOLN
0.0000 [IU] | Freq: Three times a day (TID) | SUBCUTANEOUS | Status: DC
  Administered 2017-12-07: 8 [IU] via SUBCUTANEOUS
  Administered 2017-12-08: 15 [IU] via SUBCUTANEOUS
  Administered 2017-12-08: 11 [IU] via SUBCUTANEOUS
  Administered 2017-12-08: 5 [IU] via SUBCUTANEOUS

## 2017-12-07 MED ORDER — STERILE WATER FOR INJECTION IV SOLN
INTRAVENOUS | Status: DC
  Administered 2017-12-07 – 2017-12-08 (×2): via INTRAVENOUS
  Filled 2017-12-07 (×3): qty 9.71

## 2017-12-07 MED ORDER — PANTOPRAZOLE SODIUM 40 MG PO TBEC: 40.0000 mg | DELAYED_RELEASE_TABLET | Freq: Every day | ORAL | Status: DC

## 2017-12-07 MED ORDER — ALLOPURINOL 300 MG PO TABS
300.0000 mg | ORAL_TABLET | Freq: Every day | ORAL | Status: DC
  Administered 2017-12-08 – 2017-12-13 (×6): 300 mg via ORAL
  Filled 2017-12-07 (×6): qty 1

## 2017-12-07 MED ORDER — ACETAMINOPHEN 650 MG RE SUPP: 650.0000 mg | Freq: Four times a day (QID) | RECTAL | Status: DC | PRN

## 2017-12-07 MED ORDER — SODIUM CHLORIDE 0.9% FLUSH: 3.0000 mL | INTRAVENOUS | Status: DC | PRN

## 2017-12-07 MED ORDER — INSULIN ASPART 100 UNIT/ML ~~LOC~~ SOLN
0.0000 [IU] | Freq: Every day | SUBCUTANEOUS | Status: DC
  Administered 2017-12-07 – 2017-12-08 (×2): 2 [IU] via SUBCUTANEOUS

## 2017-12-07 NOTE — H&P (Signed)
History and Physical    Tim [name]  ONG:[id number]  DOB: 07/28/1955  DOA: 12/07/2017 PCP: Leonard Downing, MD   Patient coming from: home  Chief Complaint: sent from cancer center  HPI: Tim [name] is a 62 y.o. male with medical history of of CNS Lymphoma, COPD, DM2, Gout, HTN who is being admitted for beginning of Retuximab and Methotrexate at the request of Dr Mickeal Skinner.  He has no complaints but his sugars are in the 500s. He had 2 sodas and a large lunch just now. He has been eating a lot of ice cream lately. He is not on medication for diabetes any more but was once on Metformin. He has been started on decadron for brain mets and since then sugars have been high.    ED Course: direct admit  Review of Systems:  All other systems reviewed and apart from HPI, are negative.  Past Medical History:  Diagnosis Date  . Arthritis   . Brain tumor ([place])   . COPD (chronic obstructive pulmonary disease) ([place])   . Diabetes mellitus without complication [facility])    patient states he was taken off meds at last appointment with PCP and is diet controlled  . Dyspnea   . Gout   . Hypertension 08/02/2017  . Pneumonia     Past Surgical History:  Procedure Laterality Date  . APPLICATION OF CRANIAL NAVIGATION Left 08/30/2017   Procedure: APPLICATION OF CRANIAL NAVIGATION;  Surgeon: Erline Levine, MD;  Location: [facility] [facility] [place];  Service: Neurosurgery;  Laterality: Left;  . STERIOTACTIC STIMULATOR INSERTION Left 08/30/2017   Procedure: LEFT Frontal Sterotactic Brain Biopsy with BrainLab;  Surgeon: Erline Levine, MD;  Location: [place];  Service: Neurosurgery;  Laterality: Left;    Social History:   reports that he quit smoking about 6 years ago. His smoking use included cigarettes. He has a 80.00 pack-year smoking history. He has never used smokeless tobacco. He reports that he drinks about 8.4 oz of alcohol per week. He reports that he does not use drugs.  No Known Allergies  Family  History  Problem Relation Age of Onset  . Cancer Brother      Prior to Admission medications   Medication Sig Start Date End Date Taking? Authorizing Provider  acetaminophen (TYLENOL) 500 MG tablet Take 1,000 mg by mouth every 8 (eight) hours as needed.    [provider]  albuterol (PROVENTIL HFA;VENTOLIN HFA) 108 (90 Base) MCG/ACT inhaler Inhale 2 puffs into the lungs every 4 (four) hours as needed for wheezing or shortness of breath.    [provider]  allopurinol (ZYLOPRIM) 300 MG tablet Take 300 mg by mouth daily.    [provider]  atenolol (TENORMIN) 100 MG tablet Take 50 mg by mouth daily.    [provider]  b complex vitamins tablet Take 1 tablet by mouth daily.    [provider]  budesonide-formoterol (SYMBICORT) 160-4.5 MCG/ACT inhaler Take 2 puffs first thing in am and then another 2 puffs about 12 hours later. 09/24/17   Tanda Rockers, MD  dexamethasone (DECADRON) 4 MG tablet Take 1 tablet by mouth 2 (two) times daily. 11/12/17 12/12/17  [provider]  levETIRAcetam (KEPPRA) 500 MG tablet Take 1 tablet (500 mg total) by mouth 2 (two) times daily. 11/18/17   Ventura Sellers, MD  Multiple Vitamin (MULTIVITAMIN) tablet Take 1 tablet by mouth daily.    [provider]  pantoprazole (PROTONIX) 40 MG tablet Take 1 tablet by  mouth daily. Take while on Decadron 11/10/17 11/10/18  [provider]  valsartan (DIOVAN) 160 MG tablet Take 1 tablet (160 mg total) by mouth daily. 08/24/17   Tanda Rockers, MD    Physical Exam: Wt Readings from Last 3 Encounters:  12/07/17 111.9 kg (246 lb 11.2 oz)  11/18/17 114.7 kg (252 lb 12.8 oz)  09/24/17 113.9 kg (251 lb)   Vitals:   12/07/17 1252  BP: 138/73  Pulse: (!) 57  Resp: 20  SpO2: 93%      Constitutional:  Calm & comfortable Eyes: PERRLA, lids and conjunctivae normal ENT:  Mucous membranes are moist.  Pharynx clear of exudate   Normal dentition.  Neck:  Supple, no masses  Respiratory:  Clear to auscultation bilaterally  Normal respiratory effort.  Cardiovascular:  S1 & S2 heard, regular rate and rhythm No Murmurs Abdomen:  Non distended No tenderness, No masses Bowel sounds normal Extremities:  No clubbing / cyanosis 1+ pedal edema No joint deformity    Skin:  No rashes, lesions or ulcers Neurologic:  AAO x 3 CN 2-12 grossly intact Sensation intact Strength 5/5 in all 4 extremities Psychiatric:  Normal Mood and affect    Labs on Admission: I have personally reviewed following labs and imaging studies  CBC: Recent Labs  Lab 12/01/17 0839 12/07/17 0856  WBC 10.5* 10.0  NEUTROABS 8.3* 7.4*  HGB 14.1 14.6  HCT 41.4 42.9  MCV 94.7 94.7  PLT 125* 474*   Basic Metabolic Panel: Recent Labs  Lab 12/07/17 0856 12/07/17 1326  NA 132*  --   K 4.5  --   CL 95*  --   CO2 26  --   GLUCOSE 529* 488*  BUN 20  --   CREATININE 1.19  --   CALCIUM 9.1  --    GFR: Estimated Creatinine Clearance: 88.1 mL/min (by C-G formula based on SCr of 1.19 mg/dL). Liver Function Tests: Recent Labs  Lab 12/07/17 0856  AST 23  ALT 49*  ALKPHOS 91  BILITOT 0.3  PROT 6.5  ALBUMIN 3.3*   No results for input(s): LIPASE, AMYLASE in the last 168 hours. No results for input(s): AMMONIA in the last 168 hours. Coagulation Profile: No results for input(s): INR, PROTIME in the last 168 hours. Cardiac Enzymes: No results for input(s): CKTOTAL, CKMB, CKMBINDEX, TROPONINI in the last 168 hours. BNP (last 3 results) Recent Labs    09/15/17 1730  PROBNP 49.0   HbA1C: No results for input(s): HGBA1C in the last 72 hours. CBG: Recent Labs  Lab 12/07/17 1255  GLUCAP 506*   Lipid Profile: No results for input(s): CHOL, HDL, LDLCALC, TRIG, CHOLHDL, LDLDIRECT in the last 72 hours. Thyroid Function Tests: No results for input(s): TSH, T4TOTAL, FREET4, T3FREE, THYROIDAB in the last 72 hours. Anemia Panel: No results for input(s):  VITAMINB12, FOLATE, FERRITIN, TIBC, IRON, RETICCTPCT in the last 72 hours. Urine analysis:    Component Value Date/Time   COLORURINE YELLOW 03/28/2007 1459   APPEARANCEUR CLEAR 03/28/2007 1459   LABSPEC 1.014 03/28/2007 1459   PHURINE 5.5 03/28/2007 1459   GLUCOSEU NEGATIVE 03/28/2007 1459   HGBUR NEGATIVE 03/28/2007 1459   BILIRUBINUR NEGATIVE 03/28/2007 1459   KETONESUR NEGATIVE 03/28/2007 1459   PROTEINUR NEGATIVE 03/28/2007 1459   UROBILINOGEN 0.2 03/28/2007 1459   NITRITE NEGATIVE 03/28/2007 1459   LEUKOCYTESUR  03/28/2007 1459    NEGATIVE MICROSCOPIC NOT DONE ON URINES WITH NEGATIVE PROTEIN, BLOOD, LEUKOCYTES, NITRITE, OR GLUCOSE <1000 mg/dL.   Sepsis  Labs: @LABRCNTIP (procalcitonin:4,lacticidven:4) )No results found for this or any previous visit (from the past 240 hour(s)).   Radiological Exams on Admission: No results found.   Assessment/Plan Principal Problem:   Primary CNS lymphoma  - cont steroids for cytotoxic edema - on Keppra as outpt - will continue - chemo per oncology  Active Problems:   Essential hypertension - cont Atenolol and Diovan with holding parameter  Mild AKI/ hyponatremia - Cr 0.82 on 09/15/17 and now is 1.19- sodium 132 - IV fluids being started by Dr Mauri Reading    COPD  GOLD III  - Symbicort substitute ordered- no wheezing currently- of note he is on steroids    DM (diabetes mellitus), type 2  - sugar high because he has drank 2 bottles of soda prior to coming in to the hospital  - high dose sliding scale started- discussed sticking to a diabetic diet - check A1c- may need long acting insulin but   Mild thrombocytopenia - follow   DVT prophylaxis: Lovenox Code Status: Full code  Family Communication: wife   Disposition Plan:   Consults called: Dr Mickeal Skinner  Admission status: inpatient    Debbe Odea MD [facility] Pager: [email or web address] Password [email or web address] 7PM-7AM, please contact night-coverage   12/07/2017, 3:37 PM

## 2017-12-07 NOTE — Progress Notes (Signed)
Called and gave report for direct admission to [facility] [phone].  Patient admitting to room 1618.  Dr Burtis Junes will be hospitalist on file.  Dr Cecil Cobbs will be neuro oncologist following chemotherapy during hospitalization.

## 2017-12-07 NOTE — Progress Notes (Signed)
[facility] [place], [place] [address] [phone]   Pre-Admission Evaluation  Date of Service: 12/07/17 Patient Name: Tim [name] Patient MRN: [id number] Patient DOB: Feb 28, 1956 Provider: Ventura Sellers, MD  Identifying Statement:  Tim [name] is a 62 y.o. male with left frontal CNS lymphoma  Interval History:  Tim [name] presents today for admission and initiation of cycle#1 of high dose methotrexate and rituximab for CNS lymphoma.  He has no new complaints today, still having some weakness of the left leg and shaky handwriting.  Otherwise no seizures, headaches, new neurologic deficits.  No issues from bone marrow biopsy done recently.    Medications: Current Outpatient Medications on File Prior to Visit  Medication Sig Dispense Refill  . acetaminophen (TYLENOL) 500 MG tablet Take 1,000 mg by mouth every 8 (eight) hours as needed.    Marland Kitchen albuterol (PROVENTIL HFA;VENTOLIN HFA) 108 (90 Base) MCG/ACT inhaler Inhale 2 puffs into the lungs every 4 (four) hours as needed for wheezing or shortness of breath.    . allopurinol (ZYLOPRIM) 300 MG tablet Take 300 mg by mouth daily.    Marland Kitchen atenolol (TENORMIN) 100 MG tablet Take 50 mg by mouth daily.    Marland Kitchen b complex vitamins tablet Take 1 tablet by mouth daily.    . budesonide-formoterol (SYMBICORT) 160-4.5 MCG/ACT inhaler Take 2 puffs first thing in am and then another 2 puffs about 12 hours later. 1 Inhaler 12  . dexamethasone (DECADRON) 4 MG tablet Take 1 tablet by mouth 2 (two) times daily.    Marland Kitchen levETIRAcetam (KEPPRA) 500 MG tablet Take 1 tablet (500 mg total) by mouth 2 (two) times daily. 60 tablet 5  . Multiple Vitamin (MULTIVITAMIN) tablet Take 1 tablet by mouth daily.    . pantoprazole (PROTONIX) 40 MG tablet Take 1 tablet by mouth daily. Take while on Decadron    . valsartan (DIOVAN) 160 MG tablet Take 1 tablet (160 mg total) by mouth daily. 30 tablet 11    No current facility-administered medications on file prior to visit.     Allergies: No Known Allergies Past Medical History:  Past Medical History:  Diagnosis Date  . Arthritis   . Brain tumor ([place])   . COPD (chronic obstructive pulmonary disease) ([place])   . Diabetes mellitus without complication [facility])    patient states he was taken off meds at last appointment with PCP and is diet controlled  . Dyspnea   . Gout   . Hypertension 08/02/2017  . Pneumonia    Past Surgical History: none    Social History:  Social History   Socioeconomic History  . Marital status: Significant Other    Spouse name: [name]  . Number of children: Not on file  . Years of education: 16  . Highest education level: Bachelor's degree (e.g., BA, AB, BS)  Occupational History  . 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 Financial resource strain: Not hard at all  . Food insecurity:    Worry: Never true    Inability: Never true  . Transportation needs:    Medical: No    Non-medical: No  Tobacco Use  . Smoking status: Former Smoker    Packs/day: 2.00    Years: 40.00    Pack years: 80.00    Types: Cigarettes    Last attempt to quit: 08/10/2011    Years since quitting: 6.3  .  Smokeless tobacco: Never Used  Substance and Sexual Activity  . Alcohol use: Yes    Alcohol/week: 8.4 oz    Types: 14 Cans of beer per week    Frequency: Never  . Drug use: Never  . Sexual activity: Not Currently  Lifestyle  . Physical activity:    Days per week: 0 days    Minutes per session: 0 min  . Stress: Only a little  Relationships  . Social connections:    Talks on phone: Once a week    Gets together: Once a week    Attends religious service: Never    Active member of club or organization: No    Attends meetings of clubs or organizations: Never    Relationship status: Living with partner  . Intimate partner violence:    Fear of current or ex partner: No    Emotionally  abused: No    Physically abused: No    Forced sexual activity: No  Other Topics Concern  . Not on file  Social History Narrative  . Not on file   Family History:  Family History  Problem Relation Age of Onset  . Cancer Brother     Review of Systems: Constitutional: Denies fevers, chills or abnormal weight loss Eyes: Denies blurriness of vision Ears, nose, mouth, throat, and face: Denies mucositis or sore throat Respiratory: chronic hoarseness Cardiovascular: Denies palpitation, chest discomfort or lower extremity swelling Gastrointestinal:  Denies nausea, constipation, diarrhea GU: Denies dysuria or incontinence Skin: Denies abnormal skin rashes Neurological: Per HPI Musculoskeletal: Denies joint pain, back or neck discomfort. No decrease in ROM Behavioral/Psych: Denies anxiety, disturbance in thought content, and mood instability  Physical Exam: Vitals:   12/07/17 0923  BP: 127/71  Pulse: 61  Resp: 18  Temp: 97.9 F (36.6 C)  SpO2: 94%   KPS: 80. General: Alert, cooperative, pleasant, in no acute distress Head: Biopsy scar noted, dry and intact. EENT: No conjunctival injection or scleral icterus. Oral mucosa moist Lungs: Resp effort normal Cardiac: Regular rate and rhythm Abdomen: Soft, non-distended abdomen Skin: No rashes cyanosis or petechiae. Extremities: No clubbing or edema  Neurologic Exam: Mental Status: Awake, alert, attentive to examiner. Oriented to self and environment. Language fluent with intact comprehension, repetition, reading. Cranial Nerves: Visual acuity is grossly normal. Visual fields are full. Extra-ocular movements intact. No ptosis. Face is symmetric, tongue midline. Motor: Tone and bulk are normal. Power 4+/5 in right arm and leg. Reflexes are symmetric, no pathologic reflexes present. Intact finger to nose bilaterally Sensory: Intact to light touch and temperature Gait: Normal, independent   Labs: I have reviewed the data as listed     Component Value Date/Time   NA 132 (L) 12/07/2017 0856   K 4.5 12/07/2017 0856   CL 95 (L) 12/07/2017 0856   CO2 26 12/07/2017 0856   GLUCOSE 529 (H) 12/07/2017 0856   BUN 20 12/07/2017 0856   CREATININE 1.19 12/07/2017 0856   CALCIUM 9.1 12/07/2017 0856   PROT 6.5 12/07/2017 0856   ALBUMIN 3.3 (L) 12/07/2017 0856   AST 23 12/07/2017 0856   ALT 49 (H) 12/07/2017 0856   ALKPHOS 91 12/07/2017 0856   BILITOT 0.3 12/07/2017 0856   GFRNONAA >60 12/07/2017 0856   GFRAA >60 12/07/2017 0856   Lab Results  Component Value Date   WBC 10.0 12/07/2017   NEUTROABS 7.4 (H) 12/07/2017   HGB 14.6 12/07/2017   HCT 42.9 12/07/2017   MCV 94.7 12/07/2017   PLT 131 (L)  12/07/2017     Imaging:  CLINICAL DATA:  Initial treatment strategy for CNS lymphoma.  EXAM: NUCLEAR MEDICINE PET SKULL BASE TO THIGH  TECHNIQUE: 13.5 mCi F-18 FDG was injected intravenously. Full-ring PET imaging was performed from the skull base to thigh after the radiotracer. CT data was obtained and used for attenuation correction and anatomic localization.  Fasting blood glucose: 141 mg/dl  COMPARISON:  CT scans 08/04/2017 and MRI brain 08/16/2017.  FINDINGS: Mediastinal blood pool activity: SUV max 2.85  NECK: No hypermetabolic lymph nodes in the neck.  Incidental CT findings: none  CHEST: Numerous ill-defined hazy pulmonary lesions are hypermetabolic. Findings most consistent with some type of inflammatory or infectious lung disease or possibly drug reaction. There also hypermetabolic hilar and mediastinal nodes. These are not enlarged however and again I think these are probably reactive. This does not look like lymphoma in the lungs and the chest CT from 08/04/2017 was relatively normal.  Incidental CT findings: none  ABDOMEN/PELVIS: No abnormal hypermetabolic activity within the liver, pancreas, adrenal glands, or spleen. No hypermetabolic lymph nodes in the abdomen or  pelvis.  Incidental CT findings: The liver and spleen are normal in size.  SKELETON: No focal hypermetabolic activity to suggest skeletal metastasis.  Incidental CT findings: none  IMPRESSION: 1. Numerous pulmonary lesions which are hypermetabolic along with hypermetabolic small mediastinal and hilar nodes. Findings most likely due to an inflammatory or infectious pulmonary process or possible drug reaction. Pulmonary lymphoma is highly unlikely. Recommend correlation with clinical findings and appropriate treatment. A short-term follow-up chest CT may be helpful (2-3 months). 2. No findings for lymphoma Nissen vomit of the abdomen/pelvis or skeletal structures. No axillary or inguinal adenopathy.   Electronically Signed   By: Marijo Sanes M.D.   On: 09/10/2017 17:09  [place] Clinician Interpretation: I have personally reviewed the MRI brain with and without contrast from outside institution dated 11/09/17.  My interpretation, in the context of the patient's clinical presentation, is progressive disease  Pathology     Assessment/Plan  CNS lymphoma [facility])   Mr. [place] is clinically stable today and ready for initiation of cycle #1 HD-MTX and rituximab.    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   -appropriate to proceed with C1 of high dose MTX with leucovorin rescue -MTX-R therapy C1D1today: Methotrexate 3.5g/m2 given over 4 hours  -Leucovorin 68m q6 hours 24 hours after MTX infusion   -Rituxan can be given 24-48 hours after MTX infusion -To start ASAP: IV fluids, 1/4NS with 106m sodium bicarbonate at 12519mr.  Need to adjust rate to urine pH >7 closer to 8. -daily labs with cbc, cmp -daily urine pH once it reaches target pH of >7 -daily MTX levels until MTX levels <0.10 -continue same supportive medications with exception of protonix  which interferes with clearance of MTX  -ok to continue decadron 4mg62mD as prior  2) Focal seizures -Con't 500mg57m Keppra  3) Reported sleep apnea/hypopnea -Uses non-pressurized oxygen 3L nocturnal.  Doesn't require O2 even with exertion during the day.   4) Hyperglycemia -likely secondary to decadron and recent meal this AM.   -Con't to follow, may require ISS during admission  We appreciate the opportunity to participate in the care of ChrisRoyal Hawthornill continue to follow daily during this admission.  Direct contact # is [phone] total time spent  in the encounter was 40 minutes and more than 50% was on counseling and review of test results   Ventura Sellers, MD Medical Director of Neuro-Oncology [facility] at [place] 12/07/17 9:09 AM

## 2017-12-07 NOTE — Progress Notes (Signed)
On admission pts glucose was 566, per the hospitalist, the nurse gave the patient the total amount of sliding scale insulin (15 units) and the patient was not to eat until dinner time.

## 2017-12-08 ENCOUNTER — Telehealth: Payer: Self-pay

## 2017-12-08 DIAGNOSIS — J449 Chronic obstructive pulmonary disease, unspecified: Secondary | ICD-10-CM

## 2017-12-08 DIAGNOSIS — Z79899 Other long term (current) drug therapy: Secondary | ICD-10-CM

## 2017-12-08 DIAGNOSIS — Z809 Family history of malignant neoplasm, unspecified: Secondary | ICD-10-CM

## 2017-12-08 DIAGNOSIS — Z87891 Personal history of nicotine dependence: Secondary | ICD-10-CM

## 2017-12-08 DIAGNOSIS — G40109 Localization-related (focal) (partial) symptomatic epilepsy and epileptic syndromes with simple partial seizures, not intractable, without status epilepticus: Secondary | ICD-10-CM

## 2017-12-08 DIAGNOSIS — E1165 Type 2 diabetes mellitus with hyperglycemia: Secondary | ICD-10-CM

## 2017-12-08 DIAGNOSIS — C8589 Other specified types of non-Hodgkin lymphoma, extranodal and solid organ sites: Secondary | ICD-10-CM

## 2017-12-08 DIAGNOSIS — R739 Hyperglycemia, unspecified: Secondary | ICD-10-CM

## 2017-12-08 DIAGNOSIS — G4733 Obstructive sleep apnea (adult) (pediatric): Secondary | ICD-10-CM

## 2017-12-08 DIAGNOSIS — I10 Essential (primary) hypertension: Secondary | ICD-10-CM

## 2017-12-08 LAB — URINALYSIS, DIPSTICK ONLY
Bilirubin Urine: NEGATIVE
Bilirubin Urine: NEGATIVE
Bilirubin Urine: NEGATIVE
Bilirubin Urine: NEGATIVE
Bilirubin Urine: NEGATIVE
Glucose, UA: >=500 mg/dL — AB
Glucose, UA: >=500 mg/dL — AB
Glucose, UA: >=500 mg/dL — AB
Glucose, UA: >=500 mg/dL — AB
Glucose, UA: >=500 mg/dL — AB
HGB URINE DIPSTICK: NEGATIVE
HGB URINE DIPSTICK: NEGATIVE
HGB URINE DIPSTICK: NEGATIVE
Hgb urine dipstick: NEGATIVE
Hgb urine dipstick: NEGATIVE
Ketones, ur: 5 mg/dL — AB
Ketones, ur: 5 mg/dL — AB
Ketones, ur: NEGATIVE mg/dL
Ketones, ur: NEGATIVE mg/dL
Ketones, ur: NEGATIVE mg/dL
LEUKOCYTES UA: NEGATIVE
LEUKOCYTES UA: NEGATIVE
NITRITE: NEGATIVE
Nitrite: NEGATIVE
Nitrite: NEGATIVE
Nitrite: NEGATIVE
Nitrite: NEGATIVE
PROTEIN: NEGATIVE mg/dL
PROTEIN: NEGATIVE mg/dL
Protein, ur: NEGATIVE mg/dL
Protein, ur: NEGATIVE mg/dL
Protein, ur: NEGATIVE mg/dL
SPECIFIC GRAVITY, URINE: 1.023 (ref 1.005–1.030)
Specific Gravity, Urine: 1.025 (ref 1.005–1.030)
Specific Gravity, Urine: 1.022 (ref 1.005–1.030)
Specific Gravity, Urine: 1.012 (ref 1.005–1.030)
Specific Gravity, Urine: 1.023 (ref 1.005–1.030)
pH: 5 (ref 5.0–8.0)
pH: 5 (ref 5.0–8.0)
pH: 6 (ref 5.0–8.0)
pH: 6 (ref 5.0–8.0)
pH: 6 (ref 5.0–8.0)

## 2017-12-08 LAB — CBC WITH DIFFERENTIAL/PLATELET
BASOS PCT: 0 %
Basophils Absolute: 0 10*3/uL (ref 0.0–0.1)
EOS ABS: 0 10*3/uL (ref 0.0–0.7)
EOS PCT: 0 %
HCT: 42.3 % (ref 39.0–52.0)
HEMOGLOBIN: 14.6 g/dL (ref 13.0–17.0)
Lymphocytes Relative: 14 %
Lymphs Abs: 1.5 10*3/uL (ref 0.7–4.0)
MCH: 32.1 pg (ref 26.0–34.0)
MCHC: 34.5 g/dL (ref 30.0–36.0)
MCV: 93 fL (ref 78.0–100.0)
Monocytes Absolute: 0.6 10*3/uL (ref 0.1–1.0)
Monocytes Relative: 5 %
Neutro Abs: 9.1 10*3/uL — ABNORMAL HIGH (ref 1.7–7.7)
Neutrophils Relative %: 81 %
PLATELETS: 133 10*3/uL — AB (ref 150–400)
RBC: 4.55 MIL/uL (ref 4.22–5.81)
RDW: 12.5 % (ref 11.5–15.5)
WBC: 11.3 10*3/uL — AB (ref 4.0–10.5)

## 2017-12-08 LAB — GLUCOSE, CAPILLARY
GLUCOSE-CAPILLARY: 221 mg/dL — AB (ref 70–99)
Glucose-Capillary: 339 mg/dL — ABNORMAL HIGH (ref 70–99)
Glucose-Capillary: 212 mg/dL — ABNORMAL HIGH (ref 70–99)
Glucose-Capillary: 367 mg/dL — ABNORMAL HIGH (ref 70–99)

## 2017-12-08 LAB — COMPREHENSIVE METABOLIC PANEL
ALBUMIN: 3.5 g/dL (ref 3.5–5.0)
ALK PHOS: 73 U/L (ref 38–126)
ALT: 44 U/L (ref 0–44)
AST: 26 U/L (ref 15–41)
Anion gap: 11 (ref 5–15)
BUN: 27 mg/dL — ABNORMAL HIGH (ref 8–23)
CALCIUM: 8.7 mg/dL — AB (ref 8.9–10.3)
CHLORIDE: 92 mmol/L — AB (ref 98–111)
CO2: 30 mmol/L (ref 22–32)
Creatinine, Ser: 0.78 mg/dL (ref 0.61–1.24)
GFR calc non Af Amer: >60 mL/min (ref >60)
GLUCOSE: 311 mg/dL — AB (ref 70–99)
Potassium: 3.8 mmol/L (ref 3.5–5.1)
SODIUM: 133 mmol/L — AB (ref 135–145)
Total Bilirubin: 0.7 mg/dL (ref 0.3–1.2)
Total Protein: 6.5 g/dL (ref 6.5–8.1)

## 2017-12-08 LAB — HEMOGLOBIN A1C
HEMOGLOBIN A1C: 9.8 % — AB (ref 4.8–5.6)
MEAN PLASMA GLUCOSE: 234.56 mg/dL

## 2017-12-08 LAB — URINALYSIS, ROUTINE W REFLEX MICROSCOPIC
Bacteria, UA: NONE SEEN
Bilirubin Urine: NEGATIVE
Glucose, UA: >=500 mg/dL — AB
Hgb urine dipstick: NEGATIVE
Ketones, ur: NEGATIVE mg/dL
NITRITE: NEGATIVE
PROTEIN: NEGATIVE mg/dL
Specific Gravity, Urine: 1.007 (ref 1.005–1.030)
pH: 6 (ref 5.0–8.0)

## 2017-12-08 LAB — HIV ANTIBODY (ROUTINE TESTING W REFLEX): HIV Screen 4th Generation wRfx: NONREACTIVE

## 2017-12-08 LAB — METHOTREXATE

## 2017-12-08 MED ORDER — DIPHENHYDRAMINE HCL 25 MG PO CAPS
25.0000 mg | ORAL_CAPSULE | Freq: Every evening | ORAL | Status: DC | PRN
  Administered 2017-12-08 – 2017-12-10 (×3): 25 mg via ORAL
  Filled 2017-12-08 (×3): qty 1

## 2017-12-08 MED ORDER — DEXAMETHASONE 4 MG PO TABS
4.0000 mg | ORAL_TABLET | Freq: Every day | ORAL | Status: DC
  Administered 2017-12-09 – 2017-12-13 (×5): 4 mg via ORAL
  Filled 2017-12-08 (×5): qty 1

## 2017-12-08 MED ORDER — STERILE WATER FOR INJECTION IV SOLN
INTRAVENOUS | Status: DC
  Administered 2017-12-08 – 2017-12-13 (×11): via INTRAVENOUS
  Filled 2017-12-08 (×14): qty 850

## 2017-12-08 MED ORDER — INSULIN GLARGINE 100 UNIT/ML ~~LOC~~ SOLN
12.0000 [IU] | Freq: Every day | SUBCUTANEOUS | Status: DC
  Administered 2017-12-08 – 2017-12-09 (×2): 12 [IU] via SUBCUTANEOUS
  Filled 2017-12-08 (×3): qty 0.12

## 2017-12-08 NOTE — Progress Notes (Signed)
Discussed methotrexate and rituxan regimens at length with pt. Provided pt handouts and reviewed possible side effects and outcomes for chemotherapy regimen. Discussed with pt potential complications and answered all questions at this time. Encouraged pt to communicate any concerns or future questions with care team as well.

## 2017-12-08 NOTE — Progress Notes (Signed)
PROGRESS NOTE    Tim [name]  XQJ:[id number] DOB: [birth year] DOA: 12/07/2017 PCP: Leonard Downing, MD  Brief Narrative:  Tim [name] is a 62 y.o. male with medical history of of CNS Lymphoma, COPD, DM2, Gout, HTN and other comorbids who is being admitted for beginning of Rituximab and Methotrexate at the request of Dr Mickeal Skinner.   He has no complaints but his sugars were in the 500s. He had 2 sodas and a large lunch just now. He has been eating a lot of ice cream lately. He is not on medication for diabetes any more but was once on Metformin. He has been started on decadron for brain mets and since then sugars have been high. Started on IVF per Neuro-Oncology but unfortunately cannot have Chemotherapy started until Urine pH >7.   Assessment & Plan:   Principal Problem:   Primary CNS lymphoma ([place]) Active Problems:   Essential hypertension   COPD  GOLD III    DM (diabetes mellitus), type 2 (HCC)   Primary CNS Diffuse Large B-Cell Lymphoma  -C/w Steroids with Dexamethasone 4 mg po BID for cytotoxic edema -On Levitriacetam 500 mg po BID as outpt - will continue -Chemotherapy with HD-Methotrexate with Leucovorin Resuce and Rituximab to be started by Oncology once Urine pH >7 -Oncology checking MTX Level and Hepatitis Panel -Check Urine Dipstick per Oncology -C/w Daily CBC and CMP   Essential Hypertension -C/w Atenolol 50 mg po Daily and Diovan substitution with Irbesartan 150 mg po Daily with holding parameters  Mild AKI/ Hyponatremia - Cr was 0.82 on 09/15/17 and now is 1.19- sodium 132 - IV fluids being started by Dr Mauri Reading and is now on Sodium Bicarbonate 150 mEQ in Sterile Water at a rate of 125 mL/hr -BUN/Cr has improved and is now 27/0.78 -Na+ is improved slightly to 133  COPD  GOLD III  -Currently not in Exacerbation -C/w Breo Ellipta 200-25 mcg/INH 1 puff Daily  -C/w Albuterol 2.5 mg IH q4hprn Wheezing and SOB  Uncontrolled DM (diabetes mellitus),  type 2  -Sugar was high because he has drank 2 bottles of soda prior to coming in to the hospital along with taking Decadron -Moderate Novolog SSI AC/HS started; Received 15 units additional yesterday -C/w Diabetic Diet and Strict Adherence -Change Sodium Bicarbonate to Sterile Water -Checked A1c and was 9.8  -Will start Long Acting 12 units of Lantus Daily  -Consult Diabetes Education Coordinator  -Continue to Monitor CBG's closely   Mild Thrombocytopenia -Platelet Count went fom 125 -> 131 -> 133 -Continue to Monitor for S/Sx of Bleeding -Continue to Monitor and Repeat CBC in AM  Leukocytosis -Mild as WBC is 11.3 -In the Setting of Steroid Demargination -Continue to Monitor for S/Sx of Infection -Repeat CBC in AM   Hx of Gout -C/w Allopurinol 300 mg po Daily   DVT prophylaxis: SCDs due to Thrombocytopenia  Code Status: FULL CODE Family Communication: Discussed with wife at bedside Disposition Plan: Pending Improvement and Clearance by Neuro-Oncology   Consultants:   Neuro-Oncology Dr. Mickeal Skinner   Procedures: None   Antimicrobials:  Anti-infectives (From admission, onward)   None     Subjective: And examined this morning and had no complaints.  No chest pain, shortness breath, nausea, vomiting.  States his blood sugars are uncontrolled secondary to taking dexamethasone for 3 weeks.  No other complaints or concerns at this time and is asking inquiring about when chemotherapy will be started.  Objective: Vitals:   12/07/17 2144 12/08/17 0600  12/08/17 0736 12/08/17 0817  BP: 120/74 134/78    Pulse: (!) 54 (!) 55    Resp: 16 18    Temp: 97.9 F (36.6 C) 97.7 F (36.5 C)    TempSrc: Oral Oral    SpO2: 94% 95% 90%   Weight:    111.9 kg (246 lb 11.2 oz)  Height:    6\' 3"  (1.905 m)    Intake/Output Summary (Last 24 hours) at 12/08/2017 1157 Last data filed at 12/08/2017 0900 Gross per 24 hour  Intake 2566.06 ml  Output 300 ml  Net 2266.06 ml   Filed Weights    12/08/17 0817  Weight: 111.9 kg (246 lb 11.2 oz)   Examination: Physical Exam:  Constitutional: WN/WD obese Caucasian male in NAD and appears calm and comfortable Eyes: Lids and conjunctivae normal, sclerae anicteric  ENMT: External Ears, Nose appear normal. Grossly normal hearing. Neck: Appears normal, supple, no cervical masses, normal ROM, no appreciable thyromegaly, no JVD Respiratory: Diminished to auscultation bilaterally, no wheezing, rales, rhonchi or crackles. Normal respiratory effort and patient is not tachypenic. No accessory muscle use.  Cardiovascular: RRR, no murmurs / rubs / gallops. S1 and S2 auscultated. Trace extremity edema. Abdomen: Soft, non-tender, non-distended. No masses palpated. No appreciable hepatosplenomegaly. Bowel sounds positive z4.  GU: Deferred. Musculoskeletal: No clubbing / cyanosis of digits/nails. No joint deformity upper and lower extremities.  Skin: No rashes, lesions, ulcers on a limited skin evaluation. No induration; Warm and dry.  Neurologic: CN 2-12 grossly intact with no focal deficits. Sensation intact in all 4 Extremities. Romberg sign and cerebellar reflexes not assessed.  Psychiatric: Normal judgment and insight. Alert and oriented x 3. Normal mood and appropriate affect.   Data Reviewed: I have personally reviewed following labs and imaging studies  CBC: Recent Labs  Lab 12/07/17 0856 12/08/17 0417  WBC 10.0 11.3*  NEUTROABS 7.4* 9.1*  HGB 14.6 14.6  HCT 42.9 42.3  MCV 94.7 93.0  PLT 131* 884*   Basic Metabolic Panel: Recent Labs  Lab 12/07/17 0856 12/07/17 1326 12/08/17 0417  NA 132*  --  133*  K 4.5  --  3.8  CL 95*  --  92*  CO2 26  --  30  GLUCOSE 529* 488* 311*  BUN 20  --  27*  CREATININE 1.19  --  0.78  CALCIUM 9.1  --  8.7*   GFR: Estimated Creatinine Clearance: 131 mL/min (by C-G formula based on SCr of 0.78 mg/dL). Liver Function Tests: Recent Labs  Lab 12/07/17 0856 12/08/17 0417  AST 23 26  ALT 49*  44  ALKPHOS 91 73  BILITOT 0.3 0.7  PROT 6.5 6.5  ALBUMIN 3.3* 3.5   No results for input(s): LIPASE, AMYLASE in the last 168 hours. No results for input(s): AMMONIA in the last 168 hours. Coagulation Profile: No results for input(s): INR, PROTIME in the last 168 hours. Cardiac Enzymes: No results for input(s): CKTOTAL, CKMB, CKMBINDEX, TROPONINI in the last 168 hours. BNP (last 3 results) Recent Labs    09/15/17 1730  PROBNP 49.0   HbA1C: Recent Labs    12/08/17 0417  HGBA1C 9.8*   CBG: Recent Labs  Lab 12/07/17 1255 12/07/17 1656 12/07/17 2155 12/08/17 0738  GLUCAP 506* 261* 203* 339*   Lipid Profile: No results for input(s): CHOL, HDL, LDLCALC, TRIG, CHOLHDL, LDLDIRECT in the last 72 hours. Thyroid Function Tests: No results for input(s): TSH, T4TOTAL, FREET4, T3FREE, THYROIDAB in the last 72 hours. Anemia Panel: No results  for input(s): VITAMINB12, FOLATE, FERRITIN, TIBC, IRON, RETICCTPCT in the last 72 hours. Sepsis Labs: No results for input(s): PROCALCITON, LATICACIDVEN in the last 168 hours.  No results found for this or any previous visit (from the past 240 hour(s)).   Radiology Studies: No results found.  Scheduled Meds: . allopurinol  300 mg Oral Daily  . atenolol  50 mg Oral Daily  . dexamethasone  4 mg Oral BID WC  . fluticasone furoate-vilanterol  1 puff Inhalation Daily  . insulin aspart  0-15 Units Subcutaneous TID WC  . insulin aspart  0-5 Units Subcutaneous QHS  . irbesartan  150 mg Oral Daily  . levETIRAcetam  500 mg Oral BID  . multivitamin with minerals  1 tablet Oral Daily  . sodium chloride flush  3 mL Intravenous Q12H   Continuous Infusions: . sodium chloride    .  sodium bicarbonate (isotonic) infusion in sterile water 125 mL/hr at 12/08/17 0933    LOS: 1 day    Kerney Elbe, DO [facility] Pager [phone]  If 7PM-7AM, please contact night-coverage [email or web address] Password [facility] 12/08/2017, 11:57 AM

## 2017-12-08 NOTE — Progress Notes (Signed)
Inpatient Diabetes Program Recommendations  AACE/ADA: New Consensus Statement on Inpatient Glycemic Control (2015)  Target Ranges:  Prepandial:   less than 140 mg/dL      Peak postprandial:   less than 180 mg/dL (1-2 hours)      Critically ill patients:  140 - 180 mg/dL   Lab Results  Component Value Date   GLUCAP 212 (H) 12/08/2017   HGBA1C 9.8 (H) 12/08/2017    Review of Glycemic Control  Diabetes history: DM2 Outpatient Diabetes medications: None Current orders for Inpatient glycemic control: Lantus 12 units QD, Novolog 0-15 units tidwc and hs  HgbA1C - 9.8% - uncontrolled  Inpatient Diabetes Program Recommendations:      Increase Novolog to 0-20 units tidwc and hs Will likely need to increase Lantus dose. Reassess in am with FBS results.  Spoke with pt about HgbA1C and importance of controlling his blood sugars to prevent complicaitons. Pt states he hasn't been exercising this summer and has gained weight, eating a lot of ice cream and drinking more sodas. Has lost weight in the past and would like to restart his metformin and try to control his diabetes with diet and exercise. Will follow closely while inpatient. Discussed how steroids run blood sugars up and pt voiced understanding.  Thank you. Lorenda Peck, RD, LDN, CDE Inpatient Diabetes Coordinator [phone]

## 2017-12-08 NOTE — Progress Notes (Signed)
[facility] Progress Note  Patient Care Team: Leonard Downing, MD as PCP - General (Family Medicine)  CHIEF COMPLAINTS/PURPOSE OF CONSULTATION:  CNS Lymphoma  INTERVAL HISTORY:  Tim [name] has no complaints today.  He continues to receive aggressive IV hydration and monitoring of urine pH.    MEDICAL HISTORY:  Past Medical History:  Diagnosis Date  . Arthritis   . Brain tumor ([place])   . COPD (chronic obstructive pulmonary disease) ([place])   . Diabetes mellitus without complication [facility])    patient states he was taken off meds at last appointment with PCP and is diet controlled  . Dyspnea   . Gout   . Hypertension 08/02/2017  . Pneumonia     SURGICAL HISTORY: Past Surgical History:  Procedure Laterality Date  . APPLICATION OF CRANIAL NAVIGATION Left 08/30/2017   Procedure: APPLICATION OF CRANIAL NAVIGATION;  Surgeon: Erline Levine, MD;  Location: [facility];  Service: Neurosurgery;  Laterality: Left;  . STERIOTACTIC STIMULATOR INSERTION Left 08/30/2017   Procedure: LEFT Frontal Sterotactic Brain Biopsy with BrainLab;  Surgeon: Erline Levine, MD;  Location: [place] [facility];  Service: Neurosurgery;  Laterality: Left;    SOCIAL HISTORY: Social History   Socioeconomic History  . Marital status: Significant Other    Spouse name: [name]  . Number of children: Not on file  . Years of education: 38  . Highest education level: Bachelor's degree (e.g., BA, AB, BS)  Occupational History  . 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 Financial resource strain: Not hard at all  . Food insecurity:    Worry: Never true    Inability: Never true  . Transportation needs:    Medical: No    Non-medical: No  Tobacco Use  . Smoking status: Former Smoker    Packs/day: 2.00    Years: 40.00    Pack years: 80.00    Types: Cigarettes    Last attempt to quit: 08/10/2011    Years since quitting: 6.3  . Smokeless tobacco: Never Used   Substance and Sexual Activity  . Alcohol use: Yes    Alcohol/week: 8.4 oz    Types: 14 Cans of beer per week    Frequency: Never  . Drug use: Never  . Sexual activity: Not Currently  Lifestyle  . Physical activity:    Days per week: 0 days    Minutes per session: 0 min  . Stress: Only a little  Relationships  . Social connections:    Talks on phone: Once a week    Gets together: Once a week    Attends religious service: Never    Active member of club or organization: No    Attends meetings of clubs or organizations: Never    Relationship status: Living with partner  . Intimate partner violence:    Fear of current or ex partner: No    Emotionally abused: No    Physically abused: No    Forced sexual activity: No  Other Topics Concern  . Not on file  Social History Narrative  . Not on file    FAMILY HISTORY: Family History  Problem Relation Age of Onset  . Cancer Brother     ALLERGIES:  has No Known Allergies.  MEDICATIONS:  Current Facility-Administered Medications  Medication Dose Route Frequency Provider Last Rate Last Dose  . 0.9 %  sodium chloride infusion  250 mL Intravenous PRN Debbe Odea, MD      .  acetaminophen (TYLENOL) tablet 650 mg  650 mg Oral Q6H PRN Debbe Odea, MD       Or  . acetaminophen (TYLENOL) suppository 650 mg  650 mg Rectal Q6H PRN Rizwan, Saima, MD      . albuterol (PROVENTIL) (2.5 MG/3ML) 0.083% nebulizer solution 2.5 mg  2.5 mg Inhalation Q4H PRN Debbe Odea, MD      . allopurinol (ZYLOPRIM) tablet 300 mg  300 mg Oral Daily Debbe Odea, MD   300 mg at 12/08/17 0929  . atenolol (TENORMIN) tablet 50 mg  50 mg Oral Daily Debbe Odea, MD   50 mg at 12/08/17 0929  . [START ON 12/09/2017] dexamethasone (DECADRON) tablet 4 mg  4 mg Oral Daily Donnisha Besecker, [name], MD      . fluticasone furoate-vilanterol (BREO ELLIPTA) 200-25 MCG/INH 1 puff  1 puff Inhalation Daily Debbe Odea, MD   1 puff at 12/08/17 0735  . insulin aspart (novoLOG)  injection 0-15 Units  0-15 Units Subcutaneous TID WC Debbe Odea, MD   5 Units at 12/08/17 1218  . insulin aspart (novoLOG) injection 0-5 Units  0-5 Units Subcutaneous QHS Debbe Odea, MD   2 Units at 12/07/17 2159  . insulin glargine (LANTUS) injection 12 Units  12 Units Subcutaneous Daily Sheikh, [name], DO      . irbesartan (AVAPRO) tablet 150 mg  150 mg Oral Daily Debbe Odea, MD   150 mg at 12/08/17 0929  . levETIRAcetam (KEPPRA) tablet 500 mg  500 mg Oral BID Debbe Odea, MD   500 mg at 12/08/17 0929  . multivitamin with minerals tablet 1 tablet  1 tablet Oral Daily Debbe Odea, MD   1 tablet at 12/08/17 0929  . ondansetron (ZOFRAN) tablet 4 mg  4 mg Oral Q6H PRN Debbe Odea, MD       Or  . ondansetron (ZOFRAN) injection 4 mg  4 mg Intravenous Q6H PRN Rizwan, Saima, MD      . sodium bicarbonate 150 mEq in sterile water 1,000 mL infusion   Intravenous Continuous Ventura Sellers, MD 125 mL/hr at 12/08/17 0933    . sodium chloride flush (NS) 0.9 % injection 3 mL  3 mL Intravenous Q12H Debbe Odea, MD   3 mL at 12/07/17 1606  . sodium chloride flush (NS) 0.9 % injection 3 mL  3 mL Intravenous PRN Debbe Odea, MD        REVIEW OF SYSTEMS:   Constitutional: Denies fevers, chills or abnormal weight loss Eyes: Denies blurriness of vision Ears, nose, mouth, throat, and face: Denies mucositis or sore throat Respiratory: Denies cough, dyspnea or wheezes Cardiovascular: Denies palpitation, chest discomfort or lower extremity swelling Gastrointestinal:  Denies nausea, constipation, diarrhea GU: Denies dysuria or incontinence Skin: Denies abnormal skin rashes Neurological: Per HPI Musculoskeletal: Denies joint pain, back or neck discomfort. No decrease in ROM Behavioral/Psych: Denies anxiety, disturbance in thought content, and mood instability   PHYSICAL EXAMINATION: Vitals:   12/08/17 0736 12/08/17 1329  BP:  118/64  Pulse:  (!) 55  Resp:  16  Temp:  98 F (36.7 C)   SpO2: 90% 94%   KPS: 90. General: Alert, cooperative, pleasant, in no acute distress Head: Craniotomy scar noted, dry and intact. EENT: No conjunctival injection or scleral icterus. Oral mucosa moist Lungs: Resp effort normal Cardiac: Regular rate and rhythm Abdomen: Soft, non-distended abdomen Skin: No rashes cyanosis or petechiae. Extremities: No clubbing or edema  NEUROLOGIC EXAM: Mental Status: Awake, alert, attentive to examiner. Oriented  to self and environment. Language is fluent with intact comprehension.  Cranial Nerves: Visual acuity is grossly normal. Visual fields are full. Extra-ocular movements intact. No ptosis. Face is symmetric, tongue midline. Motor: Tone and bulk are normal. Power is full in both arms and legs. Reflexes are symmetric, no pathologic reflexes present. Intact finger to nose bilaterally Sensory: Intact to light touch and temperature Gait: Normal and tandem gait is normal.   LABORATORY DATA:  I have reviewed the data as listed Lab Results  Component Value Date   WBC 11.3 (H) 12/08/2017   HGB 14.6 12/08/2017   HCT 42.3 12/08/2017   MCV 93.0 12/08/2017   PLT 133 (L) 12/08/2017   Recent Labs    09/15/17 1730 11/18/17 1029 12/07/17 0856 12/07/17 1326 12/08/17 0417  NA 137  --  132*  --  133*  K 4.3  --  4.5  --  3.8  CL 98  --  95*  --  92*  CO2 34*  --  26  --  30  GLUCOSE 134*  --  529* 488* 311*  BUN 10  --  20  --  27*  CREATININE 0.82 1.03 1.19  --  0.78  CALCIUM 9.3  --  9.1  --  8.7*  GFRNONAA  --  >60 >60  --  >60  GFRAA  --  >60 >60  --  >60  PROT  --   --  6.5  --  6.5  ALBUMIN  --   --  3.3*  --  3.5  AST  --   --  23  --  26  ALT  --   --  49*  --  44  ALKPHOS  --   --  91  --  73  BILITOT  --   --  0.3  --  0.7     ASSESSMENT & PLAN:  Primary CNS Lymphoma  Tim [name] is clinically stable today and preparing for initiation of cycle #1 HD-MTX and rituximab.   Updates for today: -Con't checking urine pH q4 hours, last  sample at 12:00pm pH was 6. -Changed fluids to sterile water with 141meq bicarb @125 /hr -Decadron to 4mg  daily  1) Primary CNS Lymphoma: tissue confirmation by brain biopsy of presence of aggressive diffuse large B-cell lymphoma with no evidence of leptomeningeal or ocular spread.   -appropriate to proceed with C1 of high dose MTX with leucovorin rescue -MTX-R therapy C1D1today: Methotrexate 3.5g/m2 given over 4 hours  -Leucovorin 16mg  q6 hours 24 hours after MTX infusion -Rituxan can be given 24-48 hours after MTX infusion -daily labs with cbc, cmp, hepatitis panel -daily urine pH once it reaches target pH of >7 -daily MTX levels until MTX levels <0.10 -continue same supportive medications with exception of protonix which interferes with clearance of MTX   2) Focal seizures -Con't 500mg  BID Keppra  3) Reported sleep apnea/hypopnea -Uses non-pressurized oxygen 3L nocturnal.  Doesn't require O2 even with exertion during the day.   4) Hyperglycemia -likely secondary to decadron and recent meal this AM.   -Con't to follow, may require ISS during admission  We appreciate the opportunity to participate in the care of Tim [name].   Will continue to follow daily during this admission.  Direct contact # is [phone].   All questions were answered. The patient knows to call the clinic with any problems, questions or concerns.  The total time spent in the encounter was 20 minutes and more than 50%  was on counseling and review of test results     Ventura Sellers, MD 12/08/2017 2:28 PM

## 2017-12-08 NOTE — Telephone Encounter (Signed)
Per 7/30 no los

## 2017-12-09 LAB — COMPREHENSIVE METABOLIC PANEL
ALT: 36 U/L (ref 0–44)
ANION GAP: 11 (ref 5–15)
AST: 27 U/L (ref 15–41)
Albumin: 3 g/dL — ABNORMAL LOW (ref 3.5–5.0)
Alkaline Phosphatase: 65 U/L (ref 38–126)
BUN: 25 mg/dL — ABNORMAL HIGH (ref 8–23)
CHLORIDE: 88 mmol/L — AB (ref 98–111)
CO2: 37 mmol/L — AB (ref 22–32)
CREATININE: 0.65 mg/dL (ref 0.61–1.24)
Calcium: 8.4 mg/dL — ABNORMAL LOW (ref 8.9–10.3)
Glucose, Bld: 234 mg/dL — ABNORMAL HIGH (ref 70–99)
Potassium: 3.8 mmol/L (ref 3.5–5.1)
SODIUM: 136 mmol/L (ref 135–145)
Total Bilirubin: 0.8 mg/dL (ref 0.3–1.2)
Total Protein: 5.8 g/dL — ABNORMAL LOW (ref 6.5–8.1)

## 2017-12-09 LAB — URINALYSIS, ROUTINE W REFLEX MICROSCOPIC
BACTERIA UA: NONE SEEN
BILIRUBIN URINE: NEGATIVE
Hgb urine dipstick: NEGATIVE
KETONES UR: NEGATIVE mg/dL
Leukocytes, UA: NEGATIVE
NITRITE: POSITIVE — AB
PH: 7 (ref 5.0–8.0)
PROTEIN: 30 mg/dL — AB
Specific Gravity, Urine: 1.009 (ref 1.005–1.030)

## 2017-12-09 LAB — URINALYSIS, DIPSTICK ONLY
BILIRUBIN URINE: NEGATIVE
BILIRUBIN URINE: NEGATIVE
Bilirubin Urine: NEGATIVE
Bilirubin Urine: NEGATIVE
Bilirubin Urine: NEGATIVE
GLUCOSE, UA: 50 mg/dL — AB
GLUCOSE, UA: NEGATIVE mg/dL
GLUCOSE, UA: NEGATIVE mg/dL
GLUCOSE, UA: NEGATIVE mg/dL
Glucose, UA: >=500 mg/dL — AB
HGB URINE DIPSTICK: NEGATIVE
HGB URINE DIPSTICK: NEGATIVE
HGB URINE DIPSTICK: NEGATIVE
Hgb urine dipstick: NEGATIVE
Hgb urine dipstick: NEGATIVE
KETONES UR: NEGATIVE mg/dL
Ketones, ur: NEGATIVE mg/dL
Ketones, ur: NEGATIVE mg/dL
Ketones, ur: NEGATIVE mg/dL
Ketones, ur: NEGATIVE mg/dL
LEUKOCYTES UA: NEGATIVE
LEUKOCYTES UA: NEGATIVE
Leukocytes, UA: NEGATIVE
Leukocytes, UA: NEGATIVE
NITRITE: NEGATIVE
NITRITE: NEGATIVE
Nitrite: NEGATIVE
Nitrite: NEGATIVE
Nitrite: POSITIVE — AB
PH: 8 (ref 5.0–8.0)
PROTEIN: NEGATIVE mg/dL
Protein, ur: NEGATIVE mg/dL
Protein, ur: NEGATIVE mg/dL
Protein, ur: NEGATIVE mg/dL
Protein, ur: 100 mg/dL — AB
SPECIFIC GRAVITY, URINE: 1.007 (ref 1.005–1.030)
SPECIFIC GRAVITY, URINE: 1.006 (ref 1.005–1.030)
SPECIFIC GRAVITY, URINE: 1.009 (ref 1.005–1.030)
SPECIFIC GRAVITY, URINE: 1.012 (ref 1.005–1.030)
Specific Gravity, Urine: 1.003 — ABNORMAL LOW (ref 1.005–1.030)
pH: 7 (ref 5.0–8.0)
pH: 7 (ref 5.0–8.0)
pH: 7 (ref 5.0–8.0)
pH: 9 — ABNORMAL HIGH (ref 5.0–8.0)

## 2017-12-09 LAB — CBC WITH DIFFERENTIAL/PLATELET
BASOS PCT: 0 %
Basophils Absolute: 0 10*3/uL (ref 0.0–0.1)
Eosinophils Absolute: 0 10*3/uL (ref 0.0–0.7)
Eosinophils Relative: 1 %
HCT: 42.1 % (ref 39.0–52.0)
Hemoglobin: 14.5 g/dL (ref 13.0–17.0)
LYMPHS ABS: 1.7 10*3/uL (ref 0.7–4.0)
Lymphocytes Relative: 22 %
MCH: 32.7 pg (ref 26.0–34.0)
MCHC: 34.4 g/dL (ref 30.0–36.0)
MCV: 95 fL (ref 78.0–100.0)
Monocytes Absolute: 0.6 10*3/uL (ref 0.1–1.0)
Monocytes Relative: 8 %
NEUTROS ABS: 5.4 10*3/uL (ref 1.7–7.7)
NEUTROS PCT: 69 %
PLATELETS: 114 10*3/uL — AB (ref 150–400)
RBC: 4.43 MIL/uL (ref 4.22–5.81)
RDW: 12.7 % (ref 11.5–15.5)
WBC: 7.7 10*3/uL (ref 4.0–10.5)

## 2017-12-09 LAB — MAGNESIUM: Magnesium: 2.2 mg/dL (ref 1.7–2.4)

## 2017-12-09 LAB — GLUCOSE, CAPILLARY
GLUCOSE-CAPILLARY: 210 mg/dL — AB (ref 70–99)
Glucose-Capillary: 202 mg/dL — ABNORMAL HIGH (ref 70–99)
Glucose-Capillary: 164 mg/dL — ABNORMAL HIGH (ref 70–99)
Glucose-Capillary: 243 mg/dL — ABNORMAL HIGH (ref 70–99)

## 2017-12-09 LAB — PHOSPHORUS: PHOSPHORUS: 3.4 mg/dL (ref 2.5–4.6)

## 2017-12-09 MED ORDER — SODIUM CHLORIDE 0.9% FLUSH: 10.0000 mL | INTRAVENOUS | Status: DC | PRN

## 2017-12-09 MED ORDER — SODIUM BICARBONATE 650 MG PO TABS
650.0000 mg | ORAL_TABLET | Freq: Two times a day (BID) | ORAL | Status: DC
  Administered 2017-12-09: 650 mg via ORAL
  Filled 2017-12-09: qty 1

## 2017-12-09 MED ORDER — SODIUM CHLORIDE 0.9 % IV SOLN
3.5000 g/m2 | Freq: Once | INTRAVENOUS | Status: AC
  Administered 2017-12-09: 8.6 g via INTRAVENOUS
  Filled 2017-12-09: qty 172

## 2017-12-09 MED ORDER — SODIUM CHLORIDE 0.9 % IV SOLN: 3.5000 g/m2 | Freq: Once | INTRAVENOUS | Status: DC

## 2017-12-09 MED ORDER — SODIUM CHLORIDE 0.9 % IV SOLN
INTRAVENOUS | Status: DC
  Administered 2017-12-09: 11:00:00 via INTRAVENOUS

## 2017-12-09 MED ORDER — HEPARIN SOD (PORK) LOCK FLUSH 100 UNIT/ML IV SOLN: 500.0000 [IU] | Freq: Once | INTRAVENOUS | Status: DC | PRN

## 2017-12-09 MED ORDER — INSULIN ASPART 100 UNIT/ML ~~LOC~~ SOLN
0.0000 [IU] | Freq: Three times a day (TID) | SUBCUTANEOUS | Status: DC
  Administered 2017-12-09: 7 [IU] via SUBCUTANEOUS
  Administered 2017-12-09: 4 [IU] via SUBCUTANEOUS
  Administered 2017-12-09: 7 [IU] via SUBCUTANEOUS
  Administered 2017-12-10 (×2): 4 [IU] via SUBCUTANEOUS
  Administered 2017-12-10: 7 [IU] via SUBCUTANEOUS
  Administered 2017-12-11: 4 [IU] via SUBCUTANEOUS
  Administered 2017-12-11: 2 [IU] via SUBCUTANEOUS
  Administered 2017-12-12: 4 [IU] via SUBCUTANEOUS

## 2017-12-09 MED ORDER — SODIUM CHLORIDE 0.9 % IV SOLN
Freq: Once | INTRAVENOUS | Status: AC
  Administered 2017-12-09: 16 mg via INTRAVENOUS
  Filled 2017-12-09: qty 8

## 2017-12-09 MED ORDER — HEPARIN SOD (PORK) LOCK FLUSH 100 UNIT/ML IV SOLN: 250.0000 [IU] | Freq: Once | INTRAVENOUS | Status: DC | PRN

## 2017-12-09 MED ORDER — INSULIN ASPART 100 UNIT/ML ~~LOC~~ SOLN
0.0000 [IU] | Freq: Every day | SUBCUTANEOUS | Status: DC
  Administered 2017-12-09 – 2017-12-11 (×2): 2 [IU] via SUBCUTANEOUS

## 2017-12-09 MED ORDER — SODIUM CHLORIDE 0.9% FLUSH: 3.0000 mL | INTRAVENOUS | Status: DC | PRN

## 2017-12-09 MED ORDER — ALTEPLASE 2 MG IJ SOLR
2.0000 mg | Freq: Once | INTRAMUSCULAR | Status: DC | PRN
  Filled 2017-12-09: qty 2

## 2017-12-09 NOTE — Progress Notes (Signed)
PROGRESS NOTE    Tim [name]  GEX:[id number] DOB: 1956-05-06 DOA: 12/07/2017 PCP: Leonard Downing, MD  Brief Narrative:  Tim [name] is a 62 y.o. male with medical history of of CNS Lymphoma, COPD, DM2, Gout, HTN and other comorbids who is being admitted for beginning of Rituximab and Methotrexate at the request of Dr Mickeal [name].   He has no complaints but his sugars were in the 500s. He had 2 sodas and a large lunch just now. He has been eating a lot of ice cream lately. He is not on medication for diabetes any more but was once on Metformin. He has been started on decadron for brain mets and since then sugars have been high. Started on IVF per Neuro-Oncology but unfortunately cannot have Chemotherapy started until Urine pH >7. Chemotherapy to be initiated today by Neuro-Oncology as Urine pH is higher even though it is not close 8.  Assessment & Plan:   Principal Problem:   Primary CNS lymphoma ([place]) Active Problems:   Essential hypertension   COPD  GOLD III    DM (diabetes mellitus), type 2 (HCC)   Primary CNS Diffuse Large B-Cell Lymphoma  -On Steroids with Dexamethasone 4 mg po BID for cytotoxic edema and changed to Dexamethasone 4 mg po Daily by Neuro-Oncology  -On Levitriacetam 500 mg po BID as outpt - will continue -Chemotherapy with HD-Methotrexate with Leucovorin Resuce and Rituximab to be started by Oncology once Urine pH >7; Urine pH still being checked and was 7. Ideally would like target pH >7 but patient to be initiated on Chemo per Neuro-Oncology Today.  -Oncology checking MTX Level and Hepatitis Panel; Still pending  -Check Urine Dipstick per Oncology -C/w Daily CBC and CMP   Essential Hypertension -C/w Atenolol 50 mg po Daily and Diovan substitution with Irbesartan 150 mg po Daily with holding parameters  Mild AKI/ Hyponatremia, improving - Cr was 0.82 on 09/15/17 and now is 1.19- sodium 132 - IV fluids being started by Dr Mauri Reading and is now on  Sodium Bicarbonate 150 mEQ in Sterile Water at a rate of 125 mL/hr -BUN/Cr has improved and is now 25/0.65 -Na+ is improved slightly to 136 -Continue to Monitor and Repeat CMP in AM   COPD  GOLD III  -Currently not in Exacerbation -C/w Breo Ellipta 200-25 mcg/INH 1 puff Daily  -C/w Albuterol 2.5 mg IH q4hprn Wheezing and SOB  Uncontrolled DM (diabetes mellitus), type 2  -Sugar was high because he has drank 2 bottles of soda prior to coming in to the hospital along with taking Decadron -Moderate Novolog SSI AC/HS started but increased to Resistant Novolog SSI AC/HS -C/w Diabetic Diet and Strict Adherence -Change Sodium Bicarbonate to Sterile Water -Checked A1c and was 9.8  -Started Long Acting 12 units of Lantus Daily  -Consult Diabetes Education Coordinator  -Continue to Monitor CBG's closely; CBG's ranging from 202-367  Mild Thrombocytopenia -Platelet Count went fom 125 -> 131 -> 133 -> 114 -Continue to Monitor for S/Sx of Bleeding -Continue to Monitor and Repeat CBC in AM  Leukocytosis -Mild as WBC is 11.3; Now improved to 7.7 -In the Setting of Steroid Demargination -Continue to Monitor for S/Sx of Infection -Repeat CBC in AM   Hx of Gout -C/w Allopurinol 300 mg po Daily   Metabolic Alkalosis -Likely 2/2 to Sodium Bicarbonate Drip -Continue to Monitor and Repeat CMP in AM  Elevated BUN -Likely 2/2 to Steroid Usage -BUN was 25 -Continue to Monitor and repeat CMP in AM  DVT prophylaxis: SCDs due to Thrombocytopenia  Code Status: FULL CODE Family Communication: Discussed with wife at bedside Disposition Plan: Pending Improvement and Clearance by Neuro-Oncology   Consultants:   Neuro-Oncology Tim [name]   Procedures: None   Antimicrobials:  Anti-infectives (From admission, onward)   None     Subjective: Seen and examined at bedside and states he does not sleep very well and was very fatigued.  States his IV Beeping.  No chest pain, shortness breath,  nausea, vomiting.  Was wondering when his chemotherapy with be started today.  No other concerns or complaints at this time.  Objective: Vitals:   12/08/17 1329 12/08/17 2123 12/09/17 0604 12/09/17 0800  BP: 118/64 (!) 125/97 115/72   Pulse: (!) 55 (!) 54 (!) 51   Resp: 16 15 16    Temp: 98 F (36.7 C) 97.8 F (36.6 C) (!) 97.5 F (36.4 C)   TempSrc: Oral Oral Oral   SpO2: 94% 94% 95% 91%  Weight:      Height:        Intake/Output Summary (Last 24 hours) at 12/09/2017 1117 Last data filed at 12/09/2017 8295 Gross per 24 hour  Intake 3266.8 ml  Output 800 ml  Net 2466.8 ml   Filed Weights   12/08/17 0817  Weight: 111.9 kg (246 lb 11.2 oz)   Examination: Physical Exam:  Constitutional: Well nourished, well-developed obese Caucasian male who is currently no acute distress and appears comfortable comfortable laying in bed Eyes: Lids and conjunctive are normal.  Sclera anicteric ENMT: External ears and nose appear normal.  Grossly normal hearing Neck: Appears supple with no JVD Respiratory: Diminished bilaterally with no appreciable wheezing, rales, rhonchi.  Patient is not tachypneic or using accessory muscle breathe Cardiovascular: Regular rate and rhythm.  No appreciable murmurs, rubs or gallops. 1-2+ LE Edema Abdomen: Soft, nontender, distended slightly 2/2 body habitus.  Bowel sounds present x4 GU: Deferred Musculoskeletal: No contractures or cyanosis.  No joint deformities noted Skin: Visible rashes or lesions on limited skin evaluation skin is warm dry Neurologic: Cranial nerves II through XII grossly intact no appreciable focal deficit. Psychiatric: Normal mood and affect.  Intact judgment and insight.  Patient is awake and alert and oriented x3  Data Reviewed: I have personally reviewed following labs and imaging studies  CBC: Recent Labs  Lab 12/07/17 0856 12/08/17 0417 12/09/17 0349  WBC 10.0 11.3* 7.7  NEUTROABS 7.4* 9.1* 5.4  HGB 14.6 14.6 14.5  HCT 42.9  42.3 42.1  MCV 94.7 93.0 95.0  PLT 131* 133* 621*   Basic Metabolic Panel: Recent Labs  Lab 12/07/17 0856 12/07/17 1326 12/08/17 0417 12/09/17 0349  NA 132*  --  133* 136  K 4.5  --  3.8 3.8  CL 95*  --  92* 88*  CO2 26  --  30 37*  GLUCOSE 529* 488* 311* 234*  BUN 20  --  27* 25*  CREATININE 1.19  --  0.78 0.65  CALCIUM 9.1  --  8.7* 8.4*  MG  --   --   --  2.2  PHOS  --   --   --  3.4   GFR: Estimated Creatinine Clearance: 131 mL/min (by C-G formula based on SCr of 0.65 mg/dL). Liver Function Tests: Recent Labs  Lab 12/07/17 0856 12/08/17 0417 12/09/17 0349  AST 23 26 27   ALT 49* 44 36  ALKPHOS 91 73 65  BILITOT 0.3 0.7 0.8  PROT 6.5 6.5 5.8*  ALBUMIN 3.3* 3.5  3.0*   No results for input(s): LIPASE, AMYLASE in the last 168 hours. No results for input(s): AMMONIA in the last 168 hours. Coagulation Profile: No results for input(s): INR, PROTIME in the last 168 hours. Cardiac Enzymes: No results for input(s): CKTOTAL, CKMB, CKMBINDEX, TROPONINI in the last 168 hours. BNP (last 3 results) Recent Labs    09/15/17 1730  PROBNP 49.0   HbA1C: Recent Labs    12/08/17 0417  HGBA1C 9.8*   CBG: Recent Labs  Lab 12/08/17 0738 12/08/17 1159 12/08/17 1734 12/08/17 2125 12/09/17 0606  GLUCAP 339* 212* 367* 221* 202*   Lipid Profile: No results for input(s): CHOL, HDL, LDLCALC, TRIG, CHOLHDL, LDLDIRECT in the last 72 hours. Thyroid Function Tests: No results for input(s): TSH, T4TOTAL, FREET4, T3FREE, THYROIDAB in the last 72 hours. Anemia Panel: No results for input(s): VITAMINB12, FOLATE, FERRITIN, TIBC, IRON, RETICCTPCT in the last 72 hours. Sepsis Labs: No results for input(s): PROCALCITON, LATICACIDVEN in the last 168 hours.  No results found for this or any previous visit (from the past 240 hour(s)).   Radiology Studies: No results found.  Scheduled Meds: . allopurinol  300 mg Oral Daily  . atenolol  50 mg Oral Daily  . dexamethasone  4 mg Oral  Daily  . fluticasone furoate-vilanterol  1 puff Inhalation Daily  . insulin aspart  0-20 Units Subcutaneous TID WC  . insulin aspart  0-5 Units Subcutaneous QHS  . insulin glargine  12 Units Subcutaneous Daily  . irbesartan  150 mg Oral Daily  . levETIRAcetam  500 mg Oral BID  . methotrexate CHEMO IV infusion intermediate-high dose  3.5 g/m2 (Treatment Plan Recorded) Intravenous Once  . multivitamin with minerals  1 tablet Oral Daily  . sodium bicarbonate  650 mg Oral BID  . sodium chloride flush  3 mL Intravenous Q12H   Continuous Infusions: . sodium chloride    . sodium chloride    . ondansetron [facility]) with dexamethasone (DECADRON) IV    .  sodium bicarbonate (isotonic) infusion in sterile water 125 mL/hr at 12/09/17 0656    LOS: 2 days    Kerney Elbe, DO [facility] Pager [phone]  If 7PM-7AM, please contact night-coverage [email or web address] Password [email or web address] 12/09/2017, 11:17 AM

## 2017-12-09 NOTE — Progress Notes (Signed)
[facility] Progress Note  Patient Care Team: Leonard Downing, MD as PCP - General (Family Medicine)  CHIEF COMPLAINTS/PURPOSE OF CONSULTATION:  CNS Lymphoma  INTERVAL HISTORY:  Tim [name] has no complaints today.  He continues to receive aggressive IV hydration and monitoring of urine pH.    MEDICAL HISTORY:  Past Medical History:  Diagnosis Date  . Arthritis   . Brain tumor ([place])   . COPD (chronic obstructive pulmonary disease) ([place])   . Diabetes mellitus without complication [facility])    patient states he was taken off meds at last appointment with PCP and is diet controlled  . Dyspnea   . Gout   . Hypertension 08/02/2017  . Pneumonia     SURGICAL HISTORY: Past Surgical History:  Procedure Laterality Date  . APPLICATION OF CRANIAL NAVIGATION Left 08/30/2017   Procedure: APPLICATION OF CRANIAL NAVIGATION;  Surgeon: Erline Levine, MD;  Location: [place];  Service: Neurosurgery;  Laterality: Left;  . STERIOTACTIC STIMULATOR INSERTION Left 08/30/2017   Procedure: LEFT Frontal Sterotactic Brain Biopsy with BrainLab;  Surgeon: Erline Levine, MD;  Location: [place];  Service: Neurosurgery;  Laterality: Left;    SOCIAL HISTORY: Social History   Socioeconomic History  . Marital status: Significant Other    Spouse name: [name]  . Number of children: Not on file  . Years of education: 67  . Highest education level: Bachelor's degree (e.g., BA, AB, BS)  Occupational History  . 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 Financial resource strain: Not hard at all  . Food insecurity:    Worry: Never true    Inability: Never true  . Transportation needs:    Medical: No    Non-medical: No  Tobacco Use  . Smoking status: Former Smoker    Packs/day: 2.00    Years: 40.00    Pack years: 80.00    Types: Cigarettes    Last attempt to quit: 08/10/2011    Years since quitting: 6.3  . Smokeless tobacco: Never Used   Substance and Sexual Activity  . Alcohol use: Yes    Alcohol/week: 8.4 oz    Types: 14 Cans of beer per week    Frequency: Never  . Drug use: Never  . Sexual activity: Not Currently  Lifestyle  . Physical activity:    Days per week: 0 days    Minutes per session: 0 min  . Stress: Only a little  Relationships  . Social connections:    Talks on phone: Once a week    Gets together: Once a week    Attends religious service: Never    Active member of club or organization: No    Attends meetings of clubs or organizations: Never    Relationship status: Living with partner  . Intimate partner violence:    Fear of current or ex partner: No    Emotionally abused: No    Physically abused: No    Forced sexual activity: No  Other Topics Concern  . Not on file  Social History Narrative  . Not on file    FAMILY HISTORY: Family History  Problem Relation Age of Onset  . Cancer Brother     ALLERGIES:  has No Known Allergies.  MEDICATIONS:  Current Facility-Administered Medications  Medication Dose Route Frequency Provider Last Rate Last Dose  . 0.9 %  sodium chloride infusion  250 mL Intravenous PRN Debbe Odea, MD      .  0.9 %  sodium chloride infusion   Intravenous Continuous Geovanie Winnett, [name], MD      . acetaminophen (TYLENOL) tablet 650 mg  650 mg Oral Q6H PRN Debbe Odea, MD       Or  . acetaminophen (TYLENOL) suppository 650 mg  650 mg Rectal Q6H PRN Rizwan, Eunice Blase, MD      . albuterol (PROVENTIL) (2.5 MG/3ML) 0.083% nebulizer solution 2.5 mg  2.5 mg Inhalation Q4H PRN Debbe Odea, MD      . allopurinol (ZYLOPRIM) tablet 300 mg  300 mg Oral Daily Debbe Odea, MD   300 mg at 12/09/17 1025  . alteplase (CATHFLO ACTIVASE) injection 2 mg  2 mg Intracatheter Once PRN Ventura Sellers, MD      . atenolol (TENORMIN) tablet 50 mg  50 mg Oral Daily Debbe Odea, MD   50 mg at 12/09/17 1026  . dexamethasone (DECADRON) tablet 4 mg  4 mg Oral Daily Ventura Sellers, MD   4 mg  at 12/09/17 1026  . diphenhydrAMINE (BENADRYL) capsule 25 mg  25 mg Oral QHS PRN Raiford Noble [place], DO   25 mg at 12/08/17 2148  . fluticasone furoate-vilanterol (BREO ELLIPTA) 200-25 MCG/INH 1 puff  1 puff Inhalation Daily Debbe Odea, MD   1 puff at 12/09/17 0800  . insulin aspart (novoLOG) injection 0-20 Units  0-20 Units Subcutaneous TID WC Raiford Noble [place], DO   4 Units at 12/09/17 1307  . insulin aspart (novoLOG) injection 0-5 Units  0-5 Units Subcutaneous QHS Sheikh, [name], DO      . insulin glargine (LANTUS) injection 12 Units  12 Units Subcutaneous Daily Raiford Noble [place], [place]   12 Units at 12/09/17 1025  . irbesartan (AVAPRO) tablet 150 mg  150 mg Oral Daily Debbe Odea, MD   150 mg at 12/09/17 1025  . levETIRAcetam (KEPPRA) tablet 500 mg  500 mg Oral BID Debbe Odea, MD   500 mg at 12/09/17 1025  . methotrexate (50 mg/ml) 8.6 g in sodium chloride 0.9 % 1,000 mL chemo infusion  3.5 g/m2 (Treatment Plan Recorded) Intravenous Once Ventura Sellers, MD 195 mL/hr at 12/09/17 1239 8.6 g at 12/09/17 1239  . multivitamin with minerals tablet 1 tablet  1 tablet Oral Daily Debbe Odea, MD   1 tablet at 12/09/17 1026  . ondansetron (ZOFRAN) tablet 4 mg  4 mg Oral Q6H PRN Debbe Odea, MD       Or  . ondansetron (ZOFRAN) injection 4 mg  4 mg Intravenous Q6H PRN Rizwan, Saima, MD      . sodium bicarbonate 150 mEq in sterile water 1,000 mL infusion   Intravenous Continuous Ventura Sellers, MD 125 mL/hr at 12/09/17 1230    . sodium chloride flush (NS) 0.9 % injection 3 mL  3 mL Intravenous Q12H Debbe Odea, MD   3 mL at 12/07/17 1606  . sodium chloride flush (NS) 0.9 % injection 3 mL  3 mL Intravenous PRN Debbe Odea, MD        REVIEW OF SYSTEMS:   Constitutional: Denies fevers, chills or abnormal weight loss Eyes: Denies blurriness of vision Ears, nose, mouth, throat, and face: Denies mucositis or sore throat Respiratory: Denies cough, dyspnea or  wheezes Cardiovascular: Denies palpitation, chest discomfort or lower extremity swelling Gastrointestinal:  Denies nausea, constipation, diarrhea GU: Denies dysuria or incontinence Skin: Denies abnormal skin rashes Neurological: Per HPI Musculoskeletal: Denies joint pain, back or neck discomfort. No decrease in ROM Behavioral/Psych: Denies anxiety,  disturbance in thought content, and mood instability   PHYSICAL EXAMINATION: Vitals:   12/09/17 0604 12/09/17 0800  BP: 115/72   Pulse: (!) 51   Resp: 16   Temp: (!) 97.5 F (36.4 C)   SpO2: 95% 91%   KPS: 90. General: Alert, cooperative, pleasant, in no acute distress Head: Craniotomy scar noted, dry and intact. EENT: No conjunctival injection or scleral icterus. Oral mucosa moist Lungs: Resp effort normal Cardiac: Regular rate and rhythm Abdomen: Soft, non-distended abdomen Skin: No rashes cyanosis or petechiae. Extremities: No clubbing or edema  NEUROLOGIC EXAM: Mental Status: Awake, alert, attentive to examiner. Oriented to self and environment. Language is fluent with intact comprehension.  Cranial Nerves: Visual acuity is grossly normal. Visual fields are full. Extra-ocular movements intact. No ptosis. Face is symmetric, tongue midline. Motor: Tone and bulk are normal. Power is full in both arms and legs. Reflexes are symmetric, no pathologic reflexes present. Intact finger to nose bilaterally Sensory: Intact to light touch and temperature Gait: Normal and tandem gait is normal.   LABORATORY DATA:  I have reviewed the data as listed Lab Results  Component Value Date   WBC 7.7 12/09/2017   HGB 14.5 12/09/2017   HCT 42.1 12/09/2017   MCV 95.0 12/09/2017   PLT 114 (L) 12/09/2017   Recent Labs    12/07/17 0856 12/07/17 1326 12/08/17 0417 12/09/17 0349  NA 132*  --  133* 136  K 4.5  --  3.8 3.8  CL 95*  --  92* 88*  CO2 26  --  30 37*  GLUCOSE 529* 488* 311* 234*  BUN 20  --  27* 25*  CREATININE 1.19  --  0.78  0.65  CALCIUM 9.1  --  8.7* 8.4*  GFRNONAA >60  --  >60 >60  GFRAA >60  --  >60 >60  PROT 6.5  --  6.5 5.8*  ALBUMIN 3.3*  --  3.5 3.0*  AST 23  --  26 27  ALT 49*  --  44 36  ALKPHOS 91  --  73 65  BILITOT 0.3  --  0.7 0.8     ASSESSMENT & PLAN:  Primary CNS Lymphoma  Tim [name] is clinically stable today, Methotrexate infusion was started at noon without issue.  Updates for today: -Will decrease IV fluids to 100/hr due to urine pH of 9 -Discontinue planned oral bicarbonate -Leucovorin to start tomorrow q6h -Ok for rituximab infusion tomorrow afternoon  1) Primary CNS Lymphoma: tissue confirmation by brain biopsy of presence of aggressive diffuse large B-cell lymphoma with no evidence of leptomeningeal or ocular spread.   -appropriate to proceed with C1 of high dose MTX with leucovorin rescue -MTX-R therapy C1D1today: Methotrexate 3.5g/m2 given over 4 hours  -Leucovorin 16mg  q6 hours 24 hours after MTX infusion -Rituxan can be given 24-48 hours after MTX infusion -daily labs with cbc, cmp, hepatitis panel -daily urine pH once it reaches target pH of >7 -daily MTX levels until MTX levels <0.10 -continue same supportive medications with exception of protonix which interferes with clearance of MTX   2) Focal seizures -Con't 500mg  BID Keppra  3) Reported sleep apnea/hypopnea -Uses non-pressurized oxygen 3L nocturnal.  Doesn't require O2 even with exertion during the day.   4) Hyperglycemia -now on standing insluin -diabetes coordinator following  We appreciate the opportunity to participate in the care of Tim [name].   Will continue to follow daily during this admission.  Direct contact # is [phone].  All questions were answered. The patient knows to call the clinic with any problems, questions or concerns.  The total time spent in the encounter was 20 minutes and more than 50% was on counseling and review of test results     Ventura Sellers, MD 12/09/2017 3:28 PM

## 2017-12-09 NOTE — Progress Notes (Signed)
Inpatient Diabetes Program Recommendations  AACE/ADA: New Consensus Statement on Inpatient Glycemic Control (2015)  Target Ranges:  Prepandial:   less than 140 mg/dL      Peak postprandial:   less than 180 mg/dL (1-2 hours)      Critically ill patients:  140 - 180 mg/dL   Lab Results  Component Value Date   GLUCAP 164 (H) 12/09/2017   HGBA1C 9.8 (H) 12/08/2017    Review of Glycemic Control  Needs insulin adjustment with blood sugars in 200s. To start chemo today.  Inpatient Diabetes Program Recommendations:    Increase Lantus to 18 units QD Add meal coverage insulin - 4 units tidwc.  Continue to follow closely.  Thank you. Lorenda Peck, RD, LDN, CDE Inpatient Diabetes Coordinator [phone]

## 2017-12-09 NOTE — Progress Notes (Signed)
Dose and dilution for Methotrexate verified with Clotilde Dieter, RN.

## 2017-12-10 ENCOUNTER — Encounter (HOSPITAL_COMMUNITY): Payer: Self-pay | Admitting: Internal Medicine

## 2017-12-10 LAB — URINALYSIS, DIPSTICK ONLY
BILIRUBIN URINE: NEGATIVE
BILIRUBIN URINE: NEGATIVE
BILIRUBIN URINE: NEGATIVE
Bilirubin Urine: NEGATIVE
GLUCOSE, UA: 150 mg/dL — AB
Glucose, UA: NEGATIVE mg/dL
Glucose, UA: NEGATIVE mg/dL
Glucose, UA: NEGATIVE mg/dL
Hgb urine dipstick: NEGATIVE
Hgb urine dipstick: NEGATIVE
Hgb urine dipstick: NEGATIVE
Hgb urine dipstick: NEGATIVE
KETONES UR: NEGATIVE mg/dL
Ketones, ur: NEGATIVE mg/dL
Ketones, ur: NEGATIVE mg/dL
Ketones, ur: NEGATIVE mg/dL
LEUKOCYTES UA: NEGATIVE
LEUKOCYTES UA: NEGATIVE
LEUKOCYTES UA: NEGATIVE
LEUKOCYTES UA: NEGATIVE
NITRITE: NEGATIVE
NITRITE: NEGATIVE
NITRITE: NEGATIVE
Nitrite: NEGATIVE
PH: 7 (ref 5.0–8.0)
PROTEIN: NEGATIVE mg/dL
Protein, ur: NEGATIVE mg/dL
Protein, ur: NEGATIVE mg/dL
Protein, ur: NEGATIVE mg/dL
SPECIFIC GRAVITY, URINE: 1.011 (ref 1.005–1.030)
Specific Gravity, Urine: 1.009 (ref 1.005–1.030)
Specific Gravity, Urine: 1.006 (ref 1.005–1.030)
Specific Gravity, Urine: 1.008 (ref 1.005–1.030)
pH: 9 — ABNORMAL HIGH (ref 5.0–8.0)
pH: 9 — ABNORMAL HIGH (ref 5.0–8.0)
pH: 9 — ABNORMAL HIGH (ref 5.0–8.0)

## 2017-12-10 LAB — CBC WITH DIFFERENTIAL/PLATELET
BASOS PCT: 0 %
Basophils Absolute: 0 10*3/uL (ref 0.0–0.1)
EOS ABS: 0 10*3/uL (ref 0.0–0.7)
EOS PCT: 0 %
HCT: 39.5 % (ref 39.0–52.0)
HEMOGLOBIN: 13.4 g/dL (ref 13.0–17.0)
LYMPHS ABS: 0.9 10*3/uL (ref 0.7–4.0)
Lymphocytes Relative: 14 %
MCH: 32.5 pg (ref 26.0–34.0)
MCHC: 33.9 g/dL (ref 30.0–36.0)
MCV: 95.9 fL (ref 78.0–100.0)
MONO ABS: 0.4 10*3/uL (ref 0.1–1.0)
MONOS PCT: 6 %
NEUTROS ABS: 4.9 10*3/uL (ref 1.7–7.7)
NEUTROS PCT: 80 %
Platelets: 102 10*3/uL — ABNORMAL LOW (ref 150–400)
RBC: 4.12 MIL/uL — ABNORMAL LOW (ref 4.22–5.81)
RDW: 12.7 % (ref 11.5–15.5)
WBC: 6.2 10*3/uL (ref 4.0–10.5)

## 2017-12-10 LAB — HEPATITIS PANEL, ACUTE
Hep A IgM: NEGATIVE
Hep B C IgM: NEGATIVE
Hepatitis B Surface Ag: NEGATIVE

## 2017-12-10 LAB — COMPREHENSIVE METABOLIC PANEL
ALK PHOS: 58 U/L (ref 38–126)
ALT: 44 U/L (ref 0–44)
ANION GAP: 10 (ref 5–15)
AST: 32 U/L (ref 15–41)
Albumin: 2.9 g/dL — ABNORMAL LOW (ref 3.5–5.0)
BUN: 24 mg/dL — ABNORMAL HIGH (ref 8–23)
CALCIUM: 8.4 mg/dL — AB (ref 8.9–10.3)
CO2: 40 mmol/L — AB (ref 22–32)
Chloride: 91 mmol/L — ABNORMAL LOW (ref 98–111)
Creatinine, Ser: 0.82 mg/dL (ref 0.61–1.24)
GFR calc Af Amer: >60 mL/min (ref >60)
GFR calc non Af Amer: >60 mL/min (ref >60)
Glucose, Bld: 189 mg/dL — ABNORMAL HIGH (ref 70–99)
POTASSIUM: 3.7 mmol/L (ref 3.5–5.1)
SODIUM: 141 mmol/L (ref 135–145)
Total Bilirubin: 0.5 mg/dL (ref 0.3–1.2)
Total Protein: 5.6 g/dL — ABNORMAL LOW (ref 6.5–8.1)

## 2017-12-10 LAB — URINALYSIS, ROUTINE W REFLEX MICROSCOPIC
Bilirubin Urine: NEGATIVE
Glucose, UA: NEGATIVE mg/dL
Hgb urine dipstick: NEGATIVE
KETONES UR: NEGATIVE mg/dL
LEUKOCYTES UA: NEGATIVE
NITRITE: NEGATIVE
PH: 8 (ref 5.0–8.0)
PROTEIN: NEGATIVE mg/dL
Specific Gravity, Urine: 1.006 (ref 1.005–1.030)

## 2017-12-10 LAB — PHOSPHORUS: Phosphorus: 4.5 mg/dL (ref 2.5–4.6)

## 2017-12-10 LAB — METHOTREXATE: Methotrexate: 27.75

## 2017-12-10 LAB — GLUCOSE, CAPILLARY
GLUCOSE-CAPILLARY: 151 mg/dL — AB (ref 70–99)
GLUCOSE-CAPILLARY: 165 mg/dL — AB (ref 70–99)
GLUCOSE-CAPILLARY: 166 mg/dL — AB (ref 70–99)
Glucose-Capillary: 240 mg/dL — ABNORMAL HIGH (ref 70–99)
Glucose-Capillary: 244 mg/dL — ABNORMAL HIGH (ref 70–99)

## 2017-12-10 LAB — MAGNESIUM: Magnesium: 2.3 mg/dL (ref 1.7–2.4)

## 2017-12-10 MED ORDER — EPINEPHRINE PF 1 MG/ML IJ SOLN
0.5000 mg | Freq: Once | INTRAMUSCULAR | Status: DC | PRN
  Filled 2017-12-10: qty 1

## 2017-12-10 MED ORDER — SODIUM CHLORIDE 0.9 % IV SOLN: Freq: Once | INTRAVENOUS | Status: DC | PRN

## 2017-12-10 MED ORDER — METHYLPREDNISOLONE SODIUM SUCC 125 MG IJ SOLR: 125.0000 mg | Freq: Once | INTRAMUSCULAR | Status: DC | PRN

## 2017-12-10 MED ORDER — ACETAMINOPHEN 325 MG PO TABS
650.0000 mg | ORAL_TABLET | Freq: Once | ORAL | Status: AC
  Administered 2017-12-10: 650 mg via ORAL
  Filled 2017-12-10: qty 2

## 2017-12-10 MED ORDER — LEUCOVORIN CALCIUM INJECTION 100 MG
15.0000 mg | Freq: Four times a day (QID) | INTRAMUSCULAR | Status: AC
  Administered 2017-12-10 – 2017-12-11 (×4): 16 mg via INTRAVENOUS
  Filled 2017-12-10 (×5): qty 0.8

## 2017-12-10 MED ORDER — SODIUM CHLORIDE 0.9 % IV SOLN
375.0000 mg/m2 | Freq: Once | INTRAVENOUS | Status: AC
  Administered 2017-12-10: 900 mg via INTRAVENOUS
  Filled 2017-12-10: qty 50

## 2017-12-10 MED ORDER — INSULIN ASPART 100 UNIT/ML ~~LOC~~ SOLN
4.0000 [IU] | Freq: Three times a day (TID) | SUBCUTANEOUS | Status: DC
  Administered 2017-12-10 – 2017-12-12 (×8): 4 [IU] via SUBCUTANEOUS

## 2017-12-10 MED ORDER — DIPHENHYDRAMINE HCL 50 MG PO CAPS
50.0000 mg | ORAL_CAPSULE | Freq: Once | ORAL | Status: AC
  Administered 2017-12-10: 50 mg via ORAL
  Filled 2017-12-10: qty 1

## 2017-12-10 MED ORDER — ONDANSETRON HCL 4 MG/2ML IJ SOLN
Freq: Once | INTRAMUSCULAR | Status: AC
  Administered 2017-12-10: 16 mg via INTRAVENOUS
  Filled 2017-12-10: qty 8

## 2017-12-10 MED ORDER — INSULIN GLARGINE 100 UNIT/ML ~~LOC~~ SOLN
18.0000 [IU] | Freq: Every day | SUBCUTANEOUS | Status: DC
  Administered 2017-12-10 – 2017-12-12 (×3): 18 [IU] via SUBCUTANEOUS
  Filled 2017-12-10 (×4): qty 0.18

## 2017-12-10 MED ORDER — EPINEPHRINE PF 1 MG/10ML IJ SOSY: 0.2500 mg | PREFILLED_SYRINGE | Freq: Once | INTRAMUSCULAR | Status: DC | PRN

## 2017-12-10 MED ORDER — DIPHENHYDRAMINE HCL 50 MG/ML IJ SOLN: 25.0000 mg | Freq: Once | INTRAMUSCULAR | Status: DC | PRN

## 2017-12-10 MED ORDER — ALBUTEROL SULFATE (2.5 MG/3ML) 0.083% IN NEBU: 2.5000 mg | INHALATION_SOLUTION | Freq: Once | RESPIRATORY_TRACT | Status: DC | PRN

## 2017-12-10 MED ORDER — FAMOTIDINE IN NACL 20-0.9 MG/50ML-% IV SOLN: 20.0000 mg | Freq: Once | INTRAVENOUS | Status: DC | PRN

## 2017-12-10 MED ORDER — DIPHENHYDRAMINE HCL 50 MG/ML IJ SOLN: 50.0000 mg | Freq: Once | INTRAMUSCULAR | Status: DC | PRN

## 2017-12-10 NOTE — Care Management Important Message (Signed)
Important Message  Patient Details  Name: Tim [name] MRN: [id number] Date of Birth: [birth year]   Medicare Important Message Given:  Yes    Kerin Salen 12/10/2017, 10:18 AMImportant Message  Patient Details  Name: Tim [name] MRN: [id number] Date of Birth: Jul 27, 1955   Medicare Important Message Given:  Yes    Kerin Salen 12/10/2017, 10:18 AM

## 2017-12-10 NOTE — Progress Notes (Signed)
PROGRESS NOTE    Tim [name]  JKK:[id number] DOB: [birth year] DOA: 12/07/2017 PCP: Leonard Downing, MD  Brief Narrative:  Tim [name] is a 62 y.o. male with medical history of of CNS Lymphoma, COPD, DM2, Gout, HTN and other comorbids who is being admitted for beginning of Rituximab and Methotrexate at the request of Dr Mickeal Skinner.   He has no complaints but his sugars were in the 500s. He had 2 sodas and a large lunch just now. He has been eating a lot of ice cream lately. He is not on medication for diabetes any more but was once on Metformin. He has been started on decadron for brain mets and since then sugars have been high. Started on IVF per Neuro-Oncology but unfortunately cannot have Chemotherapy started until Urine pH >7. Chemotherapy initiated by Neuro-Oncology and patient received MTX yesterday and will get Rituxan today.   Assessment & Plan:   Principal Problem:   Primary CNS lymphoma ([place]) Active Problems:   Essential hypertension   COPD  GOLD III    DM (diabetes mellitus), type 2 (HCC)   Primary CNS Diffuse Large B-Cell Lymphoma  -On Steroids with Dexamethasone 4 mg po BID for cytotoxic edema and changed to Dexamethasone 4 mg po Daily by Neuro-Oncology  -On Levitriacetam 500 mg po BID as outpt - will continue -Chemotherapy with HD-Methotrexate with Leucovorin Resuce and Rituximab to be started by Oncology once Urine pH >7; Chemotherapy initiated by Neuro-Oncology Yesterday  -Oncology checking MTX Level which was 27.72 and Hepatitis Panel Negative -Check Urine Dipstick per Oncology -C/w Daily CBC and CMP   Essential Hypertension -C/w Atenolol 50 mg po Daily and Diovan substitution with Irbesartan 150 mg po Daily with holding parameters  Mild AKI/ Hyponatremia, improving - Cr was 0.82 on 09/15/17 and now is 1.19- sodium 132 - IV fluids being started by Dr Mauri Reading and is now on Sodium Bicarbonate 150 mEQ in Sterile Water at a rate of 125 mL/hr -BUN/Cr has  improved and is now 24/0.82 -Na+ is improved to 141 -Continue to Monitor and Repeat CMP in AM   COPD  GOLD III  -Currently not in Exacerbation -C/w Breo Ellipta 200-25 mcg/INH 1 puff Daily  -C/w Albuterol 2.5 mg IH q4hprn Wheezing and SOB  Uncontrolled DM (Diabetes Mellitus), type 2  -Sugar was high because he has drank 2 bottles of soda prior to coming in to the hospital along with taking Decadron -Moderate Novolog SSI AC/HS started but increased to Resistant Novolog SSI AC/HS -C/w Diabetic Diet and Strict Adherence -Change Sodium Bicarbonate to Sterile Water -Checked A1c and was 9.8  -Started Long Acting 12 units of Lantus Daily and increased to 18 units sq Daily -Also added Meal Coverage Insulin with 4 units TIDwc -Consult Diabetes Education Coordinator  -Continue to Monitor CBG's closely; CBG's imrpoving ranging from 151-243  Mild Thrombocytopenia -Platelet Count went fom 125 -> 131 -> 133 -> 114 -> 102 -Continue to Monitor for S/Sx of Bleeding -Continue to Monitor and Repeat CBC in AM  Leukocytosis -Mild as WBC is 11.3; Now improved to 6.2 -In the Setting of Steroid Demargination -Continue to Monitor for S/Sx of Infection -Repeat CBC in AM   Hx of Gout -C/w Allopurinol 300 mg po Daily   Metabolic Alkalosis -Likely 2/2 to Sodium Bicarbonate Drip -CO2 steadily increasing and is now 40 -Continue to Monitor and Repeat CMP in AM  Elevated BUN -Likely 2/2 to Steroid Usage -BUN was 24 -Continue to Monitor and repeat CMP  in AM  DVT prophylaxis: SCDs due to Thrombocytopenia  Code Status: FULL CODE Family Communication: Discussed with wife at bedside Disposition Plan: Pending Improvement and Clearance by Neuro-Oncology   Consultants:   Neuro-Oncology Dr. Mickeal Skinner   Procedures: None   Antimicrobials:  Anti-infectives (From admission, onward)   None     Subjective: Seen and examined at and he was sitting in a chair with no issues. No CP or SOB. No Nausea or  Vomiting. States he slept ok and feels like his legs are still swollen and is attributing it to the steroids. No other concerns or complaints at this time.   Objective: Vitals:   12/09/17 2027 12/10/17 0415 12/10/17 0908 12/10/17 0909  BP: 137/77 132/72    Pulse: (!) 53 60    Resp: 18 12    Temp: 97.7 F (36.5 C) 97.8 F (36.6 C)    TempSrc: Oral Oral    SpO2: 98% 99% 93% 93%  Weight:      Height:        Intake/Output Summary (Last 24 hours) at 12/10/2017 1142 Last data filed at 12/10/2017 0857 Gross per 24 hour  Intake 2781.77 ml  Output 1975 ml  Net 806.77 ml   Filed Weights   12/08/17 0817  Weight: 111.9 kg (246 lb 11.2 oz)   Examination: Physical Exam:  Constitutional: Well-nourished, well-developed obese Caucasian male is currently in no acute distress and appears calm and comfortable.  He is sitting in a chair bedside Eyes: Lids and conjunctive are normal.  Sclera anicteric. ENMT: External ears and nose appear normal.  Grossly normal hearing Neck: Appears supple with no JVD Respiratory: Diminished to auscultation bilaterally with no appreciable wheezing, rales, rhonchi.  Patient not tachypneic or using any accessory muscles to breathe.  Has unlabored breathing Cardiovascular: Regular rate and rhythm.  No appreciable murmurs, rubs or gallops.  1+ peripheral edema noted bilaterally Abdomen: Soft, nontender, distended slightly secondary body habitus.  Bowel sounds present in 4 quadrants GU: Deferred Musculoskeletal: No contractures cyanosis.  No joint deformities noted Skin: Appreciable rashes or lesions on limited skin evaluation.  Skin is warm and dry Neurologic: Cranial nerves II through XII grossly intact no appreciable focal deficit Psychiatric: Normal mood and affect.  Patient is awake and alert  Data Reviewed: I have personally reviewed following labs and imaging studies  CBC: Recent Labs  Lab 12/07/17 0856 12/08/17 0417 12/09/17 0349 12/10/17 0345  WBC 10.0  11.3* 7.7 6.2  NEUTROABS 7.4* 9.1* 5.4 4.9  HGB 14.6 14.6 14.5 13.4  HCT 42.9 42.3 42.1 39.5  MCV 94.7 93.0 95.0 95.9  PLT 131* 133* 114* 440*   Basic Metabolic Panel: Recent Labs  Lab 12/07/17 0856 12/07/17 1326 12/08/17 0417 12/09/17 0349 12/10/17 0345  NA 132*  --  133* 136 141  K 4.5  --  3.8 3.8 3.7  CL 95*  --  92* 88* 91*  CO2 26  --  30 37* 40*  GLUCOSE 529* 488* 311* 234* 189*  BUN 20  --  27* 25* 24*  CREATININE 1.19  --  0.78 0.65 0.82  CALCIUM 9.1  --  8.7* 8.4* 8.4*  MG  --   --   --  2.2 2.3  PHOS  --   --   --  3.4 4.5   GFR: Estimated Creatinine Clearance: 127.8 mL/min (by C-G formula based on SCr of 0.82 mg/dL). Liver Function Tests: Recent Labs  Lab 12/07/17 3474 12/08/17 0417 12/09/17 0349 12/10/17 0345  AST 23 26 27  32  ALT 49* 44 36 44  ALKPHOS 91 73 65 58  BILITOT 0.3 0.7 0.8 0.5  PROT 6.5 6.5 5.8* 5.6*  ALBUMIN 3.3* 3.5 3.0* 2.9*   No results for input(s): LIPASE, AMYLASE in the last 168 hours. No results for input(s): AMMONIA in the last 168 hours. Coagulation Profile: No results for input(s): INR, PROTIME in the last 168 hours. Cardiac Enzymes: No results for input(s): CKTOTAL, CKMB, CKMBINDEX, TROPONINI in the last 168 hours. BNP (last 3 results) Recent Labs    09/15/17 1730  PROBNP 49.0   HbA1C: Recent Labs    12/08/17 0417  HGBA1C 9.8*   CBG: Recent Labs  Lab 12/09/17 0606 12/09/17 1141 12/09/17 1651 12/09/17 2106 12/10/17 0801  GLUCAP 202* 164* 210* 243* 151*   Lipid Profile: No results for input(s): CHOL, HDL, LDLCALC, TRIG, CHOLHDL, LDLDIRECT in the last 72 hours. Thyroid Function Tests: No results for input(s): TSH, T4TOTAL, FREET4, T3FREE, THYROIDAB in the last 72 hours. Anemia Panel: No results for input(s): VITAMINB12, FOLATE, FERRITIN, TIBC, IRON, RETICCTPCT in the last 72 hours. Sepsis Labs: No results for input(s): PROCALCITON, LATICACIDVEN in the last 168 hours.  No results found for this or any  previous visit (from the past 240 hour(s)).   Radiology Studies: No results found.  Scheduled Meds: . acetaminophen  650 mg Oral Once  . allopurinol  300 mg Oral Daily  . atenolol  50 mg Oral Daily  . dexamethasone  4 mg Oral Daily  . diphenhydrAMINE  50 mg Oral Once  . fluticasone furoate-vilanterol  1 puff Inhalation Daily  . insulin aspart  0-20 Units Subcutaneous TID WC  . insulin aspart  0-5 Units Subcutaneous QHS  . insulin aspart  4 Units Subcutaneous TID WC  . insulin glargine  18 Units Subcutaneous Daily  . irbesartan  150 mg Oral Daily  . leucovorin  16 mg Intravenous Q6H  . levETIRAcetam  500 mg Oral BID  . multivitamin with minerals  1 tablet Oral Daily  . rituximab  375 mg/m2 (Treatment Plan Recorded) Intravenous Once  . sodium chloride flush  3 mL Intravenous Q12H   Continuous Infusions: . sodium chloride    . sodium chloride 20 mL/hr at 12/09/17 1100  . sodium chloride    . famotidine    . ondansetron [facility]) with dexamethasone (DECADRON) IV    .  sodium bicarbonate (isotonic) infusion in sterile water 100 mL/hr at 12/10/17 1125    LOS: 3 days    Kerney Elbe, DO [facility] Pager [phone]  If 7PM-7AM, please contact night-coverage [email or web address] Password [facility] 12/10/2017, 11:42 AM

## 2017-12-10 NOTE — Progress Notes (Signed)
Rituxan dosage and calculations verified with Aurelio Jew, RN.

## 2017-12-10 NOTE — Progress Notes (Signed)
[facility] Progress Note  Patient Care Team: Leonard Downing, MD as PCP - General (Family Medicine)  CHIEF COMPLAINTS/PURPOSE OF CONSULTATION:  CNS Lymphoma  INTERVAL HISTORY:  Mr. Switzer completed MTX yesterday and is currently infusing rituxan.  He feels anxious about all the treatments and his eventual disposition. He continues to receive aggressive IV hydration and monitoring of urine pH.    MEDICAL HISTORY:  Past Medical History:  Diagnosis Date  . Arthritis   . Brain tumor ([place])   . COPD (chronic obstructive pulmonary disease) ([place])   . Diabetes mellitus without complication [facility])    patient states he was taken off meds at last appointment with PCP and is diet controlled  . Dyspnea   . Gout   . Hypertension 08/02/2017  . Pneumonia     SURGICAL HISTORY: Past Surgical History:  Procedure Laterality Date  . APPLICATION OF CRANIAL NAVIGATION Left 08/30/2017   Procedure: APPLICATION OF CRANIAL NAVIGATION;  Surgeon: Erline Levine, MD;  Location: [facility];  Service: Neurosurgery;  Laterality: Left;  . STERIOTACTIC STIMULATOR INSERTION Left 08/30/2017   Procedure: LEFT Frontal Sterotactic Brain Biopsy with BrainLab;  Surgeon: Erline Levine, MD;  Location: [facility];  Service: Neurosurgery;  Laterality: Left;    SOCIAL HISTORY: Social History   Socioeconomic History  . Marital status: Significant Other    Spouse name: [name]  . Number of children: Not on file  . Years of education: 67  . Highest education level: Bachelor's degree (e.g., BA, AB, BS)  Occupational History  . 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 Financial resource strain: Not hard at all  . Food insecurity:    Worry: Never true    Inability: Never true  . Transportation needs:    Medical: No    Non-medical: No  Tobacco Use  . Smoking status: Former Smoker    Packs/day: 2.00    Years: 40.00    Pack years: 80.00    Types: Cigarettes     Last attempt to quit: 08/10/2011    Years since quitting: 6.3  . Smokeless tobacco: Never Used  Substance and Sexual Activity  . Alcohol use: Yes    Alcohol/week: 8.4 oz    Types: 14 Cans of beer per week    Frequency: Never  . Drug use: Never  . Sexual activity: Not Currently  Lifestyle  . Physical activity:    Days per week: 0 days    Minutes per session: 0 min  . Stress: Only a little  Relationships  . Social connections:    Talks on phone: Once a week    Gets together: Once a week    Attends religious service: Never    Active member of club or organization: No    Attends meetings of clubs or organizations: Never    Relationship status: Living with partner  . Intimate partner violence:    Fear of current or ex partner: No    Emotionally abused: No    Physically abused: No    Forced sexual activity: No  Other Topics Concern  . Not on file  Social History Narrative  . Not on file    FAMILY HISTORY: Family History  Problem Relation Age of Onset  . Cancer Brother     ALLERGIES:  has No Known Allergies.  MEDICATIONS:  Current Facility-Administered Medications  Medication Dose Route Frequency Provider Last Rate Last Dose  . 0.9 %  sodium chloride infusion  250 mL Intravenous PRN Rizwan, Saima, MD      . 0.9 %  sodium chloride infusion   Intravenous Continuous Ventura Sellers, MD 20 mL/hr at 12/09/17 1100    . 0.9 %  sodium chloride infusion   Intravenous Once PRN Ventura Sellers, MD      . acetaminophen (TYLENOL) tablet 650 mg  650 mg Oral Q6H PRN Debbe Odea, MD       Or  . acetaminophen (TYLENOL) suppository 650 mg  650 mg Rectal Q6H PRN Rizwan, Saima, MD      . albuterol (PROVENTIL) (2.5 MG/3ML) 0.083% nebulizer solution 2.5 mg  2.5 mg Inhalation Q4H PRN Rizwan, Saima, MD      . albuterol (PROVENTIL) (2.5 MG/3ML) 0.083% nebulizer solution 2.5 mg  2.5 mg Nebulization Once PRN Ventura Sellers, MD      . allopurinol (ZYLOPRIM) tablet 300 mg  300 mg Oral  Daily Debbe Odea, MD   300 mg at 12/10/17 0900  . alteplase (CATHFLO ACTIVASE) injection 2 mg  2 mg Intracatheter Once PRN Ventura Sellers, MD      . atenolol (TENORMIN) tablet 50 mg  50 mg Oral Daily Debbe Odea, MD   50 mg at 12/10/17 0900  . dexamethasone (DECADRON) tablet 4 mg  4 mg Oral Daily Ventura Sellers, MD   4 mg at 12/10/17 0900  . diphenhydrAMINE (BENADRYL) capsule 25 mg  25 mg Oral QHS PRN Ra[place] Noble [place], DO   25 mg at 12/09/17 2133  . diphenhydrAMINE (BENADRYL) injection 25 mg  25 mg Intravenous Once PRN Vaslow, [name], MD      . diphenhydrAMINE (BENADRYL) injection 50 mg  50 mg Intravenous Once PRN Vaslow, [name], MD      . EPINEPHrine (ADRENALIN) 0.5 mg  0.5 mg Subcutaneous Once PRN Ventura Sellers, MD      . EPINEPHrine (ADRENALIN) 0.5 mg  0.5 mg Subcutaneous Once PRN Ventura Sellers, MD      . EPINEPHrine (ADRENALIN) 1 MG/10ML injection 0.25 mg  0.25 mg Intravenous Once PRN Ventura Sellers, MD      . EPINEPHrine (ADRENALIN) 1 MG/10ML injection 0.25 mg  0.25 mg Intravenous Once PRN Ventura Sellers, MD      . famotidine (PEPCID) IVPB 20 mg premix  20 mg Intravenous Once PRN Ventura Sellers, MD      . fluticasone furoate-vilanterol (BREO ELLIPTA) 200-25 MCG/INH 1 puff  1 puff Inhalation Daily Debbe Odea, MD   1 puff at 12/10/17 0908  . insulin aspart (novoLOG) injection 0-20 Units  0-20 Units Subcutaneous TID WC Raiford Noble [place], DO   4 Units at 12/10/17 1212  . insulin aspart (novoLOG) injection 0-5 Units  0-5 Units Subcutaneous QHS Raiford Noble [place], [place]   2 Units at 12/09/17 2133  . insulin aspart (novoLOG) injection 4 Units  4 Units Subcutaneous TID WC Raiford Noble [place], DO   4 Units at 12/10/17 1212  . insulin glargine (LANTUS) injection 18 Units  18 Units Subcutaneous Daily Raiford Noble [place], [place]   18 Units at 12/10/17 1050  . irbesartan (AVAPRO) tablet 150 mg  150 mg Oral Daily Debbe Odea, MD   150 mg at 12/10/17 0900  . leucovorin  injection 16 mg  16 mg Intravenous Q6H Ventura Sellers, MD   16 mg at 12/10/17 1243  . levETIRAcetam (KEPPRA) tablet 500 mg  500 mg Oral BID Debbe Odea, MD  500 mg at 12/10/17 0900  . methylPREDNISolone sodium succinate (SOLU-MEDROL) 125 mg/2 mL injection 125 mg  125 mg Intravenous Once PRN Ventura Sellers, MD      . multivitamin with minerals tablet 1 tablet  1 tablet Oral Daily Debbe Odea, MD   1 tablet at 12/10/17 0900  . ondansetron (ZOFRAN) tablet 4 mg  4 mg Oral Q6H PRN Debbe Odea, MD       Or  . ondansetron (ZOFRAN) injection 4 mg  4 mg Intravenous Q6H PRN Rizwan, Saima, MD      . sodium bicarbonate 150 mEq in sterile water 1,000 mL infusion   Intravenous Continuous Ventura Sellers, MD 80 mL/hr at 12/10/17 1540    . sodium chloride flush (NS) 0.9 % injection 3 mL  3 mL Intravenous Q12H Debbe Odea, MD   3 mL at 12/10/17 1011  . sodium chloride flush (NS) 0.9 % injection 3 mL  3 mL Intravenous PRN Debbe Odea, MD        REVIEW OF SYSTEMS:   Constitutional: Denies fevers, chills or abnormal weight loss Eyes: Denies blurriness of vision Ears, nose, mouth, throat, and face: Denies mucositis or sore throat Respiratory: Denies cough, dyspnea or wheezes Cardiovascular: Denies palpitation, chest discomfort or lower extremity swelling Gastrointestinal:  Denies nausea, constipation, diarrhea GU: Denies dysuria or incontinence Skin: Denies abnormal skin rashes Neurological: Per HPI Musculoskeletal: Denies joint pain, back or neck discomfort. No decrease in ROM Behavioral/Psych: Denies anxiety, disturbance in thought content, and mood instability   PHYSICAL EXAMINATION: Vitals:   12/10/17 1607 12/10/17 1609  BP: (!) 174/148 133/70  Pulse: (!) 56 (!) 59  Resp: 16   Temp: 97.9 F (36.6 C)   SpO2: 93% (!) 87%   KPS: 90. General: Alert, cooperative, pleasant, in no acute distress Head: Craniotomy scar noted, dry and intact. EENT: No conjunctival injection or scleral  icterus. Oral mucosa moist Lungs: Mildly increased work of breathing Cardiac: Regular rate and rhythm Abdomen: Soft, non-distended abdomen Skin: No rashes cyanosis or petechiae. Extremities: No clubbing or edema  NEUROLOGIC EXAM: Mental Status: Awake, alert, attentive to examiner. Oriented to self and environment. Language is fluent with intact comprehension.  Cranial Nerves: Visual acuity is grossly normal. Visual fields are full. Extra-ocular movements intact. No ptosis. Face is symmetric, tongue midline. Motor: Tone and bulk are normal. Power is full in both arms and legs. Reflexes are symmetric, no pathologic reflexes present. Intact finger to nose bilaterally Sensory: Intact to light touch and temperature Gait: Normal and tandem gait is normal.   LABORATORY DATA:  I have reviewed the data as listed Lab Results  Component Value Date   WBC 6.2 12/10/2017   HGB 13.4 12/10/2017   HCT 39.5 12/10/2017   MCV 95.9 12/10/2017   PLT 102 (L) 12/10/2017   Recent Labs    12/08/17 0417 12/09/17 0349 12/10/17 0345  NA 133* 136 141  K 3.8 3.8 3.7  CL 92* 88* 91*  CO2 30 37* 40*  GLUCOSE 311* 234* 189*  BUN 27* 25* 24*  CREATININE 0.78 0.65 0.82  CALCIUM 8.7* 8.4* 8.4*  GFRNONAA >60 >60 >60  GFRAA >60 >60 >60  PROT 6.5 5.8* 5.6*  ALBUMIN 3.5 3.0* 2.9*  AST 26 27 32  ALT 44 36 44  ALKPHOS 73 65 58  BILITOT 0.7 0.8 0.5     ASSESSMENT & PLAN:  Primary CNS Lymphoma  Mr. Brink is clinically stable today s/p MTX infusion, now receiving rituxan and  leucovorin.  Urine pH at 9.0.  Labs are stable with mild thrombocytopenia and expected hypochloremia with sterile water infusion.  Updates for today: -Will decrease IV fluids to 80/hr due to urine pH of 9 -Ok to use his established PRN oxygen (3L) for anxiety related dsypnea -Will be in-house tomorrow, likely in afternoon  1) Primary CNS Lymphoma: tissue confirmation by brain biopsy of presence of aggressive diffuse large B-cell  lymphoma with no evidence of leptomeningeal or ocular spread.   -appropriate to proceed with C1 of high dose MTX with leucovorin rescue -MTX-R therapy C1D1today: Methotrexate 3.5g/m2 given over 4 hours  -Leucovorin 16mg  q6 hours 24 hours after MTX infusion -Rituxan can be given 24-48 hours after MTX infusion -daily labs with cbc, cmp, hepatitis panel -daily urine pH once it reaches target pH of >7 -daily MTX levels until MTX levels <0.10 -continue same supportive medications with exception of protonix which interferes with clearance of MTX   2) Focal seizures -Con't 500mg  BID Keppra  3) Reported sleep apnea/hypopnea -Uses non-pressurized oxygen 3L nocturnal.  Doesn't require O2 even with exertion during the day.   4) Hyperglycemia -now on standing insluin -diabetes coordinator following  We appreciate the opportunity to participate in the care of [name].   Will continue to follow daily during this admission.  Direct contact # is [phone].   All questions were answered. The patient knows to call the clinic with any problems, questions or concerns.  The total time spent in the encounter was 20 minutes and more than 50% was on counseling and review of test results     Ventura Sellers, MD 12/10/2017 4:14 PM

## 2017-12-10 NOTE — Progress Notes (Signed)
Infusion completed, pt tolerated well. Some c/o anxiety related to treatment, discussed questions and concerns at length with pt with wife at bedside. Pt resting comfortably now, VS WNL, no c/o pain or discomfort, no distress noted. Will continue to monitor.

## 2017-12-11 ENCOUNTER — Inpatient Hospital Stay (HOSPITAL_COMMUNITY): Payer: Medicare Other

## 2017-12-11 DIAGNOSIS — R609 Edema, unspecified: Secondary | ICD-10-CM

## 2017-12-11 DIAGNOSIS — R600 Localized edema: Secondary | ICD-10-CM

## 2017-12-11 LAB — URINALYSIS, DIPSTICK ONLY
BILIRUBIN URINE: NEGATIVE
BILIRUBIN URINE: NEGATIVE
Bilirubin Urine: NEGATIVE
Bilirubin Urine: NEGATIVE
Bilirubin Urine: NEGATIVE
Bilirubin Urine: NEGATIVE
GLUCOSE, UA: NEGATIVE mg/dL
GLUCOSE, UA: 50 mg/dL — AB
Glucose, UA: NEGATIVE mg/dL
Glucose, UA: NEGATIVE mg/dL
Glucose, UA: NEGATIVE mg/dL
Glucose, UA: NEGATIVE mg/dL
HGB URINE DIPSTICK: NEGATIVE
HGB URINE DIPSTICK: NEGATIVE
HGB URINE DIPSTICK: NEGATIVE
Hgb urine dipstick: NEGATIVE
Hgb urine dipstick: NEGATIVE
Hgb urine dipstick: NEGATIVE
KETONES UR: NEGATIVE mg/dL
KETONES UR: NEGATIVE mg/dL
KETONES UR: NEGATIVE mg/dL
KETONES UR: NEGATIVE mg/dL
Ketones, ur: NEGATIVE mg/dL
Ketones, ur: NEGATIVE mg/dL
Leukocytes, UA: NEGATIVE
Leukocytes, UA: NEGATIVE
Leukocytes, UA: NEGATIVE
Leukocytes, UA: NEGATIVE
Leukocytes, UA: NEGATIVE
Leukocytes, UA: NEGATIVE
NITRITE: NEGATIVE
NITRITE: NEGATIVE
NITRITE: NEGATIVE
NITRITE: NEGATIVE
Nitrite: NEGATIVE
Nitrite: NEGATIVE
PH: 7 (ref 5.0–8.0)
PROTEIN: NEGATIVE mg/dL
PROTEIN: NEGATIVE mg/dL
PROTEIN: NEGATIVE mg/dL
PROTEIN: NEGATIVE mg/dL
Protein, ur: NEGATIVE mg/dL
Protein, ur: NEGATIVE mg/dL
Specific Gravity, Urine: 1.004 — ABNORMAL LOW (ref 1.005–1.030)
Specific Gravity, Urine: 1.011 (ref 1.005–1.030)
Specific Gravity, Urine: 1.009 (ref 1.005–1.030)
Specific Gravity, Urine: 1.005 (ref 1.005–1.030)
Specific Gravity, Urine: 1.009 (ref 1.005–1.030)
Specific Gravity, Urine: 1.012 (ref 1.005–1.030)
pH: 8 (ref 5.0–8.0)
pH: 8 (ref 5.0–8.0)
pH: 9 — ABNORMAL HIGH (ref 5.0–8.0)
pH: 9 — ABNORMAL HIGH (ref 5.0–8.0)
pH: 8 (ref 5.0–8.0)

## 2017-12-11 LAB — GLUCOSE, CAPILLARY
GLUCOSE-CAPILLARY: 195 mg/dL — AB (ref 70–99)
Glucose-Capillary: 101 mg/dL — ABNORMAL HIGH (ref 70–99)
Glucose-Capillary: 146 mg/dL — ABNORMAL HIGH (ref 70–99)
Glucose-Capillary: 230 mg/dL — ABNORMAL HIGH (ref 70–99)

## 2017-12-11 LAB — COMPREHENSIVE METABOLIC PANEL
ALBUMIN: 2.8 g/dL — AB (ref 3.5–5.0)
ALT: 50 U/L — ABNORMAL HIGH (ref 0–44)
ANION GAP: 14 (ref 5–15)
AST: 41 U/L (ref 15–41)
Alkaline Phosphatase: 55 U/L (ref 38–126)
BILIRUBIN TOTAL: 0.5 mg/dL (ref 0.3–1.2)
BUN: 20 mg/dL (ref 8–23)
CO2: 35 mmol/L — ABNORMAL HIGH (ref 22–32)
Calcium: 8.2 mg/dL — ABNORMAL LOW (ref 8.9–10.3)
Chloride: 91 mmol/L — ABNORMAL LOW (ref 98–111)
Creatinine, Ser: 0.77 mg/dL (ref 0.61–1.24)
GFR calc Af Amer: >60 mL/min (ref >60)
GLUCOSE: 235 mg/dL — AB (ref 70–99)
POTASSIUM: 3.5 mmol/L (ref 3.5–5.1)
Sodium: 140 mmol/L (ref 135–145)
TOTAL PROTEIN: 5.2 g/dL — AB (ref 6.5–8.1)

## 2017-12-11 LAB — MAGNESIUM: MAGNESIUM: 2.2 mg/dL (ref 1.7–2.4)

## 2017-12-11 LAB — CBC WITH DIFFERENTIAL/PLATELET
BASOS PCT: 0 %
Basophils Absolute: 0 10*3/uL (ref 0.0–0.1)
EOS PCT: 0 %
Eosinophils Absolute: 0 10*3/uL (ref 0.0–0.7)
HEMATOCRIT: 40 % (ref 39.0–52.0)
Hemoglobin: 13.4 g/dL (ref 13.0–17.0)
Lymphocytes Relative: 5 %
Lymphs Abs: 0.4 10*3/uL — ABNORMAL LOW (ref 0.7–4.0)
MCH: 32.2 pg (ref 26.0–34.0)
MCHC: 33.5 g/dL (ref 30.0–36.0)
MCV: 96.2 fL (ref 78.0–100.0)
MONO ABS: 0.2 10*3/uL (ref 0.1–1.0)
Monocytes Relative: 3 %
NEUTROS ABS: 6.4 10*3/uL (ref 1.7–7.7)
Neutrophils Relative %: 92 %
PLATELETS: 108 10*3/uL — AB (ref 150–400)
RBC: 4.16 MIL/uL — ABNORMAL LOW (ref 4.22–5.81)
RDW: 12.9 % (ref 11.5–15.5)
WBC: 6.9 10*3/uL (ref 4.0–10.5)

## 2017-12-11 LAB — PHOSPHORUS: Phosphorus: 4 mg/dL (ref 2.5–4.6)

## 2017-12-11 MED ORDER — LEUCOVORIN CALCIUM INJECTION 100 MG
15.0000 mg | Freq: Four times a day (QID) | INTRAMUSCULAR | Status: AC
  Administered 2017-12-11 – 2017-12-12 (×4): 16 mg via INTRAVENOUS
  Filled 2017-12-11 (×5): qty 0.8

## 2017-12-11 MED ORDER — TRAZODONE HCL 50 MG PO TABS
50.0000 mg | ORAL_TABLET | Freq: Once | ORAL | Status: AC
  Administered 2017-12-11: 50 mg via ORAL
  Filled 2017-12-11: qty 1

## 2017-12-11 MED ORDER — ONDANSETRON HCL 4 MG/2ML IJ SOLN
Freq: Once | INTRAMUSCULAR | Status: DC
  Filled 2017-12-11: qty 8

## 2017-12-11 NOTE — Progress Notes (Addendum)
PROGRESS NOTE    Tim [name]  LZJ:[id number] DOB: 10-06-1955 DOA: 12/07/2017 PCP: Leonard Downing, MD  Brief Narrative:  Tim [name] is a 62 y.o. male with medical history of of CNS Lymphoma, COPD, DM2, Gout, HTN and other comorbids who is being admitted for beginning of Rituximab and Methotrexate at the request of Dr Mickeal Skinner.   He has no complaints but his sugars were in the 500s. He had 2 sodas and a large lunch just now. He has been eating a lot of ice cream lately. He is not on medication for diabetes any more but was once on Metformin. He has been started on decadron for brain mets and since then sugars have been high. Started on IVF per Neuro-Oncology but unfortunately cannot have Chemotherapy started until Urine pH >7. Chemotherapy initiated by Neuro-Oncology and patient received MTX 12/09/17 and Rituxan 12/10/17.   Assessment & Plan:   Principal Problem:   Primary CNS lymphoma ([place]) Active Problems:   Essential hypertension   COPD  GOLD III    DM (diabetes mellitus), type 2 (HCC)   Primary CNS Diffuse Large B-Cell Lymphoma  -On Steroids with Dexamethasone 4 mg po BID for cytotoxic edema and changed to Dexamethasone 4 mg po Daily by Neuro-Oncology  -On Levitriacetam 500 mg po BID as outpt - will continue -Chemotherapy with HD-Methotrexate with Leucovorin Resuce and Rituximab to be started by Oncology once Urine pH >7; Chemotherapy initiated by Neuro-Oncology 12/09/17 -Oncology checking MTX Level which was 27.72 and Hepatitis Panel Negative -Check Urine Dipstick per Oncology -C/w Daily CBC and CMP  -IVF   Essential Hypertension -C/w Atenolol 50 mg po Daily and Diovan substitution with Irbesartan 150 mg po Daily with holding parameters  Mild AKI/ Hyponatremia, improving - Cr was 0.82 on 09/15/17 and now is 1.19- sodium 132 - IV fluids being started by Dr Mauri Reading and is now on Sodium Bicarbonate 150 mEQ in Sterile Water at a rate of 125 mL/hr and has now cut rate  to 80 mL/hr -BUN/Cr has improved and is now 20/0.77 -Na+ is improved to 140 -Continue to Monitor and Repeat CMP in AM   COPD  GOLD III  -Currently not in Exacerbation -C/w Breo Ellipta 200-25 mcg/INH 1 puff Daily  -C/w Albuterol 2.5 mg IH q4hprn Wheezing and SOB -Had to be placed on 3 Liters of O2 yesterday for anxiety related to dyspnea -Per Dr. Mickeal Skinner "Ok to use his established PRN oxygen (3L) for anxiety related dsypnea"  Uncontrolled DM (Diabetes Mellitus), type 2  -Sugar was high because he has drank 2 bottles of soda prior to coming in to the hospital along with taking Decadron -Moderate Novolog SSI AC/HS started but increased to Resistant Novolog SSI AC/HS -C/w Diabetic Diet and Strict Adherence -Change Sodium Bicarbonate to Sterile Water -Checked A1c and was 9.8  -Started Long Acting 12 units of Lantus Daily and increased to 18 units sq Daily -Also added Meal Coverage Insulin with 4 units TIDwc -Consult Diabetes Education Coordinator  -Continue to Monitor CBG's closely; CBG's imrpoving ranging from 101-244  Mild Thrombocytopenia -Platelet Count went fom 125 -> 131 -> 133 -> 114 -> 102 -> 108 -Continue to Monitor for S/Sx of Bleeding -Continue to Monitor and Repeat CBC in AM  Leukocytosis, improved  -Mild as WBC is 11.3; Now improved to 6.9 -In the Setting of Steroid Demargination -Continue to Monitor for S/Sx of Infection -Repeat CBC in AM   Hx of Gout -C/w Allopurinol 300 mg po Daily  Metabolic Alkalosis -Likely 2/2 to Sodium Bicarbonate Drip -CO2 steadily increased but now trended back down and is now 35 -Continue to Monitor and Repeat CMP in AM  Elevated BUN -Likely 2/2 to Steroid Usage -BUN was 20 -Continue to Monitor and repeat CMP in AM  Mildly Elevated ALT -AST is normal at 41, ALT was mildly elevated at 50 -Likely from Chemotherapy -Continue to Monitor and Trend LFT's -Repeat CMP in AM  LE Edema -In the seting of IVF Resuscitation and  Steroids -Does not Have an ECHO on file so will check and will also check LE Venous Duplex to r/o DVT -Albumin Slightly low at 2.8 -Consult Nutrition too -Ordered TED HOSE -Continue to Monitor Clinically and may need IV Lasix if continues to worsen  DVT prophylaxis: SCDs due to Thrombocytopenia  Code Status: FULL CODE Family Communication: Discussed with wife at bedside Disposition Plan: Pending Improvement and Clearance by Neuro-Oncology   Consultants:   Neuro-Oncology Dr. Mickeal Skinner   Procedures: None   Antimicrobials:  Anti-infectives (From admission, onward)   None     Subjective: Seen and examined at bedside and had no complaints. Wanted to know the plan about chemotherapy. No CP or SOB. No lightheadedness or dizziness. States he has "never been through this; Don't know what to expect." No other concerns or complaints at this time.   Objective: Vitals:   12/10/17 1722 12/10/17 2124 12/11/17 0457 12/11/17 0818  BP: 127/64 136/69 118/66   Pulse: (!) 55 60 65   Resp:  18 20   Temp:  98 F (36.7 C) 97.9 F (36.6 C)   TempSrc:  Oral Oral   SpO2: 92% 94% 97% 92%  Weight:      Height:        Intake/Output Summary (Last 24 hours) at 12/11/2017 1248 Last data filed at 12/11/2017 1215 Gross per 24 hour  Intake 3886.99 ml  Output 950 ml  Net 2936.99 ml   Filed Weights   12/08/17 0817  Weight: 111.9 kg (246 lb 11.2 oz)   Examination: Physical Exam:  Constitutional: Well-nourished, well-developed obese Caucasian male currently no acute distress appears calm comfortable laying in bed Eyes: Sclera anicteric.  Lids and conjunctive are normal ENMT: Nose appear normal.  Grossly normal hearing Neck: Appears supple with no JVD Respiratory: Diminished to auscultation bilaterally with no appreciable wheezing, rales, rhonchi.  Patient is not tachypneic using accessory muscle breathe and had unlabored breathing Cardiovascular: Regular rate and rhythm.  No appreciable murmurs, rubs,  gallops.  Has 1-2+ lower extremity edema bilaterally Abdomen: Deferred GU: Deferred soft, nontender, distended slightly secondary body habitus.  Bowel sounds present all 4 quadrants Musculoskeletal: No contractures or cyanosis.  No joint deformities noted Skin: Skin is warm and dry no appreciable rashes or lesions on limited skin evaluation Neurologic: Cranial nerves II through XII grossly intact no appreciable focal deficits Psychiatric: Anxious mood normal affect.  Patient awake and alert and oriented x3.  Intact judgment and insight  Data Reviewed: I have personally reviewed following labs and imaging studies  CBC: Recent Labs  Lab 12/07/17 0856 12/08/17 0417 12/09/17 0349 12/10/17 0345 12/11/17 0420  WBC 10.0 11.3* 7.7 6.2 6.9  NEUTROABS 7.4* 9.1* 5.4 4.9 6.4  HGB 14.6 14.6 14.5 13.4 13.4  HCT 42.9 42.3 42.1 39.5 40.0  MCV 94.7 93.0 95.0 95.9 96.2  PLT 131* 133* 114* 102* 130*   Basic Metabolic Panel: Recent Labs  Lab 12/07/17 0856 12/07/17 1326 12/08/17 0417 12/09/17 0349 12/10/17 0345 12/11/17 8657  NA 132*  --  133* 136 141 140  K 4.5  --  3.8 3.8 3.7 3.5  CL 95*  --  92* 88* 91* 91*  CO2 26  --  30 37* 40* 35*  GLUCOSE 529* 488* 311* 234* 189* 235*  BUN 20  --  27* 25* 24* 20  CREATININE 1.19  --  0.78 0.65 0.82 0.77  CALCIUM 9.1  --  8.7* 8.4* 8.4* 8.2*  MG  --   --   --  2.2 2.3 2.2  PHOS  --   --   --  3.4 4.5 4.0   GFR: Estimated Creatinine Clearance: 131 mL/min (by C-G formula based on SCr of 0.77 mg/dL). Liver Function Tests: Recent Labs  Lab 12/07/17 0856 12/08/17 0417 12/09/17 0349 12/10/17 0345 12/11/17 0420  AST 23 26 27  32 41  ALT 49* 44 36 44 50*  ALKPHOS 91 73 65 58 55  BILITOT 0.3 0.7 0.8 0.5 0.5  PROT 6.5 6.5 5.8* 5.6* 5.2*  ALBUMIN 3.3* 3.5 3.0* 2.9* 2.8*   No results for input(s): LIPASE, AMYLASE in the last 168 hours. No results for input(s): AMMONIA in the last 168 hours. Coagulation Profile: No results for input(s): INR,  PROTIME in the last 168 hours. Cardiac Enzymes: No results for input(s): CKTOTAL, CKMB, CKMBINDEX, TROPONINI in the last 168 hours. BNP (last 3 results) Recent Labs    09/15/17 1730  PROBNP 49.0   HbA1C: No results for input(s): HGBA1C in the last 72 hours. CBG: Recent Labs  Lab 12/10/17 1616 12/10/17 1618 12/10/17 2112 12/11/17 0750 12/11/17 1125  GLUCAP 240* 244* 166* 195* 101*   Lipid Profile: No results for input(s): CHOL, HDL, LDLCALC, TRIG, CHOLHDL, LDLDIRECT in the last 72 hours. Thyroid Function Tests: No results for input(s): TSH, T4TOTAL, FREET4, T3FREE, THYROIDAB in the last 72 hours. Anemia Panel: No results for input(s): VITAMINB12, FOLATE, FERRITIN, TIBC, IRON, RETICCTPCT in the last 72 hours. Sepsis Labs: No results for input(s): PROCALCITON, LATICACIDVEN in the last 168 hours.  No results found for this or any previous visit (from the past 240 hour(s)).   Radiology Studies: No results found.  Scheduled Meds: . allopurinol  300 mg Oral Daily  . atenolol  50 mg Oral Daily  . dexamethasone  4 mg Oral Daily  . fluticasone furoate-vilanterol  1 puff Inhalation Daily  . insulin aspart  0-20 Units Subcutaneous TID WC  . insulin aspart  0-5 Units Subcutaneous QHS  . insulin aspart  4 Units Subcutaneous TID WC  . insulin glargine  18 Units Subcutaneous Daily  . irbesartan  150 mg Oral Daily  . leucovorin  16 mg Intravenous Q6H  . levETIRAcetam  500 mg Oral BID  . multivitamin with minerals  1 tablet Oral Daily  . sodium chloride flush  3 mL Intravenous Q12H   Continuous Infusions: . sodium chloride    . sodium chloride 20 mL/hr at 12/09/17 1100  . sodium chloride    . famotidine    . ondansetron [facility]) with dexamethasone (DECADRON) IV    .  sodium bicarbonate (isotonic) infusion in sterile water 80 mL/hr at 12/11/17 1221    LOS: 4 days    Kerney Elbe, DO [facility] Pager [phone]  If 7PM-7AM, please contact  night-coverage [email or web address] Password [facility] 12/11/2017, 12:48 PM

## 2017-12-11 NOTE — Progress Notes (Signed)
Dr. Mickeal Skinner  notified to determine if patient should have scheduled dose of zofran/ decadron today. MD instructed nurse to hold medication for now and he will be coming to unit to assess patient and will discuss with the nurse

## 2017-12-11 NOTE — Progress Notes (Signed)
[facility] Progress Note  Patient Care Team: Leonard Downing, MD as PCP - General (Family Medicine)  CHIEF COMPLAINTS/PURPOSE OF CONSULTATION:  CNS Lymphoma  INTERVAL HISTORY:  Tim [name] completed rituxan yesterday and is currently on leucovorin rescue.  Anxiety and shortness of breath from yesterday have resolved. No new complaints.  MEDICAL HISTORY:  Past Medical History:  Diagnosis Date  . Arthritis   . Brain tumor ([place])   . COPD (chronic obstructive pulmonary disease) ([place])   . Diabetes mellitus without complication [facility])    patient states he was taken off meds at last appointment with PCP and is diet controlled  . Dyspnea   . Gout   . Hypertension 08/02/2017  . Pneumonia     SURGICAL HISTORY: Past Surgical History:  Procedure Laterality Date  . APPLICATION OF CRANIAL NAVIGATION Left 08/30/2017   Procedure: APPLICATION OF CRANIAL NAVIGATION;  Surgeon: Erline Levine, MD;  Location: [place];  Service: Neurosurgery;  Laterality: Left;  . STERIOTACTIC STIMULATOR INSERTION Left 08/30/2017   Procedure: LEFT Frontal Sterotactic Brain Biopsy with BrainLab;  Surgeon: Erline Levine, MD;  Location: [place];  Service: Neurosurgery;  Laterality: Left;    SOCIAL HISTORY: Social History   Socioeconomic History  . Marital status: Significant Other    Spouse name: [name]  . Number of children: Not on file  . Years of education: 27  . Highest education level: Bachelor's degree (e.g., BA, AB, BS)  Occupational History  . 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 Financial resource strain: Not hard at all  . Food insecurity:    Worry: Never true    Inability: Never true  . Transportation needs:    Medical: No    Non-medical: No  Tobacco Use  . Smoking status: Former Smoker    Packs/day: 2.00    Years: 40.00    Pack years: 80.00    Types: Cigarettes    Last attempt to quit: 08/10/2011    Years since quitting:  6.3  . Smokeless tobacco: Never Used  Substance and Sexual Activity  . Alcohol use: Yes    Alcohol/week: 8.4 oz    Types: 14 Cans of beer per week    Frequency: Never  . Drug use: Never  . Sexual activity: Not Currently  Lifestyle  . Physical activity:    Days per week: 0 days    Minutes per session: 0 min  . Stress: Only a little  Relationships  . Social connections:    Talks on phone: Once a week    Gets together: Once a week    Attends religious service: Never    Active member of club or organization: No    Attends meetings of clubs or organizations: Never    Relationship status: Living with partner  . Intimate partner violence:    Fear of current or ex partner: No    Emotionally abused: No    Physically abused: No    Forced sexual activity: No  Other Topics Concern  . Not on file  Social History Narrative  . Not on file    FAMILY HISTORY: Family History  Problem Relation Age of Onset  . Cancer Brother     ALLERGIES:  has No Known Allergies.  MEDICATIONS:  Current Facility-Administered Medications  Medication Dose Route Frequency Provider Last Rate Last Dose  . 0.9 %  sodium chloride infusion  250 mL Intravenous PRN Debbe Odea, MD      .  0.9 %  sodium chloride infusion   Intravenous Continuous Ventura Sellers, MD 20 mL/hr at 12/09/17 1100    . 0.9 %  sodium chloride infusion   Intravenous Once PRN Ventura Sellers, MD      . acetaminophen (TYLENOL) tablet 650 mg  650 mg Oral Q6H PRN Debbe Odea, MD       Or  . acetaminophen (TYLENOL) suppository 650 mg  650 mg Rectal Q6H PRN Rizwan, Saima, MD      . albuterol (PROVENTIL) (2.5 MG/3ML) 0.083% nebulizer solution 2.5 mg  2.5 mg Inhalation Q4H PRN Rizwan, Saima, MD      . albuterol (PROVENTIL) (2.5 MG/3ML) 0.083% nebulizer solution 2.5 mg  2.5 mg Nebulization Once PRN Ventura Sellers, MD      . allopurinol (ZYLOPRIM) tablet 300 mg  300 mg Oral Daily Debbe Odea, MD   300 mg at 12/11/17 1026  . alteplase  (CATHFLO ACTIVASE) injection 2 mg  2 mg Intracatheter Once PRN Ventura Sellers, MD      . atenolol (TENORMIN) tablet 50 mg  50 mg Oral Daily Debbe Odea, MD   50 mg at 12/11/17 1026  . dexamethasone (DECADRON) tablet 4 mg  4 mg Oral Daily Ventura Sellers, MD   4 mg at 12/11/17 1026  . diphenhydrAMINE (BENADRYL) capsule 25 mg  25 mg Oral QHS PRN Raiford Noble [place], DO   25 mg at 12/10/17 2125  . diphenhydrAMINE (BENADRYL) injection 25 mg  25 mg Intravenous Once PRN Clebert Wenger, [name], MD      . diphenhydrAMINE (BENADRYL) injection 50 mg  50 mg Intravenous Once PRN Valborg Friar, [name], MD      . EPINEPHrine (ADRENALIN) 0.5 mg  0.5 mg Subcutaneous Once PRN Ventura Sellers, MD      . EPINEPHrine (ADRENALIN) 0.5 mg  0.5 mg Subcutaneous Once PRN Ventura Sellers, MD      . EPINEPHrine (ADRENALIN) 1 MG/10ML injection 0.25 mg  0.25 mg Intravenous Once PRN Ventura Sellers, MD      . EPINEPHrine (ADRENALIN) 1 MG/10ML injection 0.25 mg  0.25 mg Intravenous Once PRN Ventura Sellers, MD      . famotidine (PEPCID) IVPB 20 mg premix  20 mg Intravenous Once PRN Ventura Sellers, MD      . fluticasone furoate-vilanterol (BREO ELLIPTA) 200-25 MCG/INH 1 puff  1 puff Inhalation Daily Debbe Odea, MD   1 puff at 12/11/17 0818  . insulin aspart (novoLOG) injection 0-20 Units  0-20 Units Subcutaneous TID WC Raiford Noble [place], DO   4 Units at 12/11/17 0845  . insulin aspart (novoLOG) injection 0-5 Units  0-5 Units Subcutaneous QHS Raiford Noble [place], [place]   2 Units at 12/09/17 2133  . insulin aspart (novoLOG) injection 4 Units  4 Units Subcutaneous TID WC Raiford Noble [place], DO   4 Units at 12/11/17 1247  . insulin glargine (LANTUS) injection 18 Units  18 Units Subcutaneous Daily Raiford Noble [place], [place]   18 Units at 12/11/17 1026  . irbesartan (AVAPRO) tablet 150 mg  150 mg Oral Daily Debbe Odea, MD   150 mg at 12/11/17 1026  . leucovorin injection 16 mg  16 mg Intravenous Q6H Ventura Sellers, MD   16  mg at 12/11/17 1257  . levETIRAcetam (KEPPRA) tablet 500 mg  500 mg Oral BID Debbe Odea, MD   500 mg at 12/11/17 1026  . methylPREDNISolone sodium succinate (SOLU-MEDROL) 125 mg/2 mL injection 125 mg  125 mg Intravenous Once PRN Ventura Sellers, MD      . multivitamin with minerals tablet 1 tablet  1 tablet Oral Daily Debbe Odea, MD   1 tablet at 12/11/17 1026  . ondansetron (ZOFRAN) 16 mg, dexamethasone (DECADRON) 20 mg in sodium chloride 0.9 % 50 mL IVPB   Intravenous Once Ventura Sellers, MD   Stopped at 12/11/17 1322  . ondansetron (ZOFRAN) tablet 4 mg  4 mg Oral Q6H PRN Debbe Odea, MD       Or  . ondansetron (ZOFRAN) injection 4 mg  4 mg Intravenous Q6H PRN Rizwan, Saima, MD      . sodium bicarbonate 150 mEq in sterile water 1,000 mL infusion   Intravenous Continuous Ventura Sellers, MD 80 mL/hr at 12/11/17 1221    . sodium chloride flush (NS) 0.9 % injection 3 mL  3 mL Intravenous Q12H Debbe Odea, MD   3 mL at 12/10/17 2127  . sodium chloride flush (NS) 0.9 % injection 3 mL  3 mL Intravenous PRN Debbe Odea, MD        REVIEW OF SYSTEMS:   Constitutional: Denies fevers, chills or abnormal weight loss Eyes: Denies blurriness of vision Ears, nose, mouth, throat, and face: Denies mucositis or sore throat Respiratory: Denies cough, dyspnea or wheezes Cardiovascular: Denies palpitation, chest discomfort or lower extremity swelling Gastrointestinal:  Denies nausea, constipation, diarrhea GU: Denies dysuria or incontinence Skin: Denies abnormal skin rashes Neurological: Per HPI Musculoskeletal: Denies joint pain, back or neck discomfort. No decrease in ROM Behavioral/Psych: Denies anxiety, disturbance in thought content, and mood instability   PHYSICAL EXAMINATION: Vitals:   12/11/17 0818 12/11/17 1353  BP:  125/64  Pulse:  (!) 52  Resp:  16  Temp:  97.8 F (36.6 C)  SpO2: 92% 90%   KPS: 90. General: Alert, cooperative, pleasant, in no acute distress Head:  Craniotomy scar noted, dry and intact. EENT: No conjunctival injection or scleral icterus. Oral mucosa moist Lungs: Mildly increased work of breathing Cardiac: Regular rate and rhythm Abdomen: Soft, non-distended abdomen Skin: No rashes cyanosis or petechiae. Extremities: mild pitting edema b/l LE  NEUROLOGIC EXAM: Mental Status: Awake, alert, attentive to examiner. Oriented to self and environment. Language is fluent with intact comprehension.  Cranial Nerves: Visual acuity is grossly normal. Visual fields are full. Extra-ocular movements intact. No ptosis. Face is symmetric, tongue midline. Motor: Tone and bulk are normal. Power is full in both arms and legs. Reflexes are symmetric, no pathologic reflexes present. Intact finger to nose bilaterally Sensory: Intact to light touch and temperature Gait: Normal and tandem gait is normal.   LABORATORY DATA:  I have reviewed the data as listed Lab Results  Component Value Date   WBC 6.9 12/11/2017   HGB 13.4 12/11/2017   HCT 40.0 12/11/2017   MCV 96.2 12/11/2017   PLT 108 (L) 12/11/2017   Recent Labs    12/09/17 0349 12/10/17 0345 12/11/17 0420  NA 136 141 140  K 3.8 3.7 3.5  CL 88* 91* 91*  CO2 37* 40* 35*  GLUCOSE 234* 189* 235*  BUN 25* 24* 20  CREATININE 0.65 0.82 0.77  CALCIUM 8.4* 8.4* 8.2*  GFRNONAA >60 >60 >60  GFRAA >60 >60 >60  PROT 5.8* 5.6* 5.2*  ALBUMIN 3.0* 2.9* 2.8*  AST 27 32 41  ALT 36 44 50*  ALKPHOS 65 58 55  BILITOT 0.8 0.5 0.5     ASSESSMENT & PLAN:  Primary CNS Lymphoma  Mr.  [name] is clinically stable today now on leucovorin.  Urine pH at 8.0 with IVF @ 80/hr.  Labs are stable with MTX level pending.  Updates for today: -Keep IV fluids to 80/hr  -Leg edema likely 2/2 free water infusion + steroids and relative inactivity -Will be in-house tomorrow, likely mid-day  1) Primary CNS Lymphoma: tissue confirmation by brain biopsy of presence of aggressive diffuse large B-cell lymphoma with no  evidence of leptomeningeal or ocular spread.   -appropriate to proceed with C1 of high dose MTX with leucovorin rescue -MTX-R therapy C1D1 8/1: Methotrexate 3.5g/m2 given over 4 hours  -Leucovorin 16mg  q6 hours 24 hours after MTX infusion -Rituxan can be given 24-48 hours after MTX infusion -daily labs with cbc, cmp, hepatitis panel -daily urine pH once it reaches target pH of >7 -daily MTX levels until MTX levels <0.10 -continue same supportive medications with exception of protonix which interferes with clearance of MTX   2) Focal seizures -Con't 500mg  BID Keppra  3) Reported sleep apnea/hypopnea -Uses non-pressurized oxygen 3L nocturnal.  Doesn't require O2 even with exertion during the day.   4) Hyperglycemia -now on standing insluin -diabetes coordinator following  We appreciate the opportunity to participate in the care of Tim [name].   Will continue to follow daily during this admission.  Direct contact # is [phone].   All questions were answered. The patient knows to call the clinic with any problems, questions or concerns.  The total time spent in the encounter was 20 minutes and more than 50% was on counseling and review of test results     Ventura Sellers, MD 12/11/2017 2:29 PM

## 2017-12-11 NOTE — Progress Notes (Signed)
LE venous duplex prelim: negative for DVT. Rahma Meller Eunice, RDMS, RVT  

## 2017-12-12 ENCOUNTER — Inpatient Hospital Stay (HOSPITAL_COMMUNITY): Payer: Medicare Other

## 2017-12-12 DIAGNOSIS — I340 Nonrheumatic mitral (valve) insufficiency: Secondary | ICD-10-CM

## 2017-12-12 LAB — GLUCOSE, CAPILLARY
Glucose-Capillary: 82 mg/dL (ref 70–99)
Glucose-Capillary: 79 mg/dL (ref 70–99)
Glucose-Capillary: 168 mg/dL — ABNORMAL HIGH (ref 70–99)
Glucose-Capillary: 109 mg/dL — ABNORMAL HIGH (ref 70–99)

## 2017-12-12 LAB — CBC WITH DIFFERENTIAL/PLATELET
Basophils Absolute: 0 10*3/uL (ref 0.0–0.1)
Basophils Relative: 0 %
EOS ABS: 0.1 10*3/uL (ref 0.0–0.7)
EOS PCT: 1 %
HCT: 38.6 % — ABNORMAL LOW (ref 39.0–52.0)
Hemoglobin: 12.7 g/dL — ABNORMAL LOW (ref 13.0–17.0)
LYMPHS ABS: 1.2 10*3/uL (ref 0.7–4.0)
LYMPHS PCT: 24 %
MCH: 32.2 pg (ref 26.0–34.0)
MCHC: 32.9 g/dL (ref 30.0–36.0)
MCV: 97.7 fL (ref 78.0–100.0)
MONO ABS: 0 10*3/uL — AB (ref 0.1–1.0)
MONOS PCT: 1 %
Neutro Abs: 3.9 10*3/uL (ref 1.7–7.7)
Neutrophils Relative %: 74 %
PLATELETS: 95 10*3/uL — AB (ref 150–400)
RBC: 3.95 MIL/uL — ABNORMAL LOW (ref 4.22–5.81)
RDW: 13 % (ref 11.5–15.5)
WBC: 5.2 10*3/uL (ref 4.0–10.5)

## 2017-12-12 LAB — URINALYSIS, DIPSTICK ONLY
BILIRUBIN URINE: NEGATIVE
Bilirubin Urine: NEGATIVE
Bilirubin Urine: NEGATIVE
Bilirubin Urine: NEGATIVE
Bilirubin Urine: NEGATIVE
GLUCOSE, UA: NEGATIVE mg/dL
GLUCOSE, UA: NEGATIVE mg/dL
GLUCOSE, UA: NEGATIVE mg/dL
Glucose, UA: NEGATIVE mg/dL
Glucose, UA: NEGATIVE mg/dL
HGB URINE DIPSTICK: NEGATIVE
HGB URINE DIPSTICK: NEGATIVE
HGB URINE DIPSTICK: NEGATIVE
Hgb urine dipstick: NEGATIVE
Hgb urine dipstick: NEGATIVE
KETONES UR: NEGATIVE mg/dL
Ketones, ur: NEGATIVE mg/dL
Ketones, ur: NEGATIVE mg/dL
Ketones, ur: NEGATIVE mg/dL
Ketones, ur: NEGATIVE mg/dL
LEUKOCYTES UA: NEGATIVE
LEUKOCYTES UA: NEGATIVE
LEUKOCYTES UA: NEGATIVE
Leukocytes, UA: NEGATIVE
Leukocytes, UA: NEGATIVE
NITRITE: NEGATIVE
NITRITE: NEGATIVE
Nitrite: NEGATIVE
Nitrite: NEGATIVE
Nitrite: NEGATIVE
PH: 8 (ref 5.0–8.0)
PH: 9 — AB (ref 5.0–8.0)
PH: 9 — AB (ref 5.0–8.0)
PH: 9 — AB (ref 5.0–8.0)
PROTEIN: NEGATIVE mg/dL
PROTEIN: NEGATIVE mg/dL
Protein, ur: NEGATIVE mg/dL
Protein, ur: NEGATIVE mg/dL
Protein, ur: NEGATIVE mg/dL
SPECIFIC GRAVITY, URINE: 1.004 — AB (ref 1.005–1.030)
SPECIFIC GRAVITY, URINE: 1.003 — AB (ref 1.005–1.030)
Specific Gravity, Urine: 1.005 (ref 1.005–1.030)
Specific Gravity, Urine: 1.008 (ref 1.005–1.030)
Specific Gravity, Urine: 1.011 (ref 1.005–1.030)
pH: 8 (ref 5.0–8.0)

## 2017-12-12 LAB — PHOSPHORUS: Phosphorus: 4.1 mg/dL (ref 2.5–4.6)

## 2017-12-12 LAB — COMPREHENSIVE METABOLIC PANEL
ALT: 87 U/L — ABNORMAL HIGH (ref 0–44)
ANION GAP: 12 (ref 5–15)
AST: 66 U/L — ABNORMAL HIGH (ref 15–41)
Albumin: 2.8 g/dL — ABNORMAL LOW (ref 3.5–5.0)
Alkaline Phosphatase: 48 U/L (ref 38–126)
BUN: 22 mg/dL (ref 8–23)
CHLORIDE: 93 mmol/L — AB (ref 98–111)
CO2: 37 mmol/L — AB (ref 22–32)
Calcium: 8.1 mg/dL — ABNORMAL LOW (ref 8.9–10.3)
Creatinine, Ser: 0.76 mg/dL (ref 0.61–1.24)
Glucose, Bld: 123 mg/dL — ABNORMAL HIGH (ref 70–99)
Potassium: 3.4 mmol/L — ABNORMAL LOW (ref 3.5–5.1)
SODIUM: 142 mmol/L (ref 135–145)
Total Bilirubin: 0.5 mg/dL (ref 0.3–1.2)
Total Protein: 5 g/dL — ABNORMAL LOW (ref 6.5–8.1)

## 2017-12-12 LAB — ECHOCARDIOGRAM COMPLETE
Height: 75 in
WEIGHTICAEL: 3947.2 [oz_av]

## 2017-12-12 LAB — METHOTREXATE: METHOTREXATE: 0.41

## 2017-12-12 LAB — MAGNESIUM: MAGNESIUM: 2.6 mg/dL — AB (ref 1.7–2.4)

## 2017-12-12 MED ORDER — POTASSIUM CHLORIDE CRYS ER 20 MEQ PO TBCR
40.0000 meq | EXTENDED_RELEASE_TABLET | Freq: Two times a day (BID) | ORAL | Status: AC
  Administered 2017-12-12 (×2): 40 meq via ORAL
  Filled 2017-12-12 (×2): qty 2

## 2017-12-12 MED ORDER — LEUCOVORIN CALCIUM INJECTION 100 MG
15.0000 mg | Freq: Four times a day (QID) | INTRAMUSCULAR | Status: AC
  Administered 2017-12-12 – 2017-12-13 (×4): 16 mg via INTRAVENOUS
  Filled 2017-12-12 (×5): qty 0.8

## 2017-12-12 NOTE — Progress Notes (Signed)
[facility] Progress Note  Patient Care Team: Leonard Downing, MD as PCP - General (Family Medicine)  CHIEF COMPLAINTS/PURPOSE OF CONSULTATION:  CNS Lymphoma  INTERVAL HISTORY:  [name] is resting comfortable today.  No new complaints.  MEDICAL HISTORY:  Past Medical History:  Diagnosis Date  . Arthritis   . Brain tumor ([place])   . COPD (chronic obstructive pulmonary disease) ([place])   . Diabetes mellitus without complication [facility])    patient states he was taken off meds at last appointment with PCP and is diet controlled  . Dyspnea   . Gout   . Hypertension 08/02/2017  . Pneumonia     SURGICAL HISTORY: Past Surgical History:  Procedure Laterality Date  . APPLICATION OF CRANIAL NAVIGATION Left 08/30/2017   Procedure: APPLICATION OF CRANIAL NAVIGATION;  Surgeon: Erline Levine, MD;  Location: [place];  Service: Neurosurgery;  Laterality: Left;  . STERIOTACTIC STIMULATOR INSERTION Left 08/30/2017   Procedure: LEFT Frontal Sterotactic Brain Biopsy with BrainLab;  Surgeon: Erline Levine, MD;  Location: [place];  Service: Neurosurgery;  Laterality: Left;    SOCIAL HISTORY: Social History   Socioeconomic History  . Marital status: Significant Other    Spouse name: [name]  . Number of children: Not on file  . Years of education: 80  . Highest education level: Bachelor's degree (e.g., BA, AB, BS)  Occupational History  . 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 Financial resource strain: Not hard at all  . Food insecurity:    Worry: Never true    Inability: Never true  . Transportation needs:    Medical: No    Non-medical: No  Tobacco Use  . Smoking status: Former Smoker    Packs/day: 2.00    Years: 40.00    Pack years: 80.00    Types: Cigarettes    Last attempt to quit: 08/10/2011    Years since quitting: 6.3  . Smokeless tobacco: Never Used  Substance and Sexual Activity  . Alcohol use: Yes     Alcohol/week: 8.4 oz    Types: 14 Cans of beer per week    Frequency: Never  . Drug use: Never  . Sexual activity: Not Currently  Lifestyle  . Physical activity:    Days per week: 0 days    Minutes per session: 0 min  . Stress: Only a little  Relationships  . Social connections:    Talks on phone: Once a week    Gets together: Once a week    Attends religious service: Never    Active member of club or organization: No    Attends meetings of clubs or organizations: Never    Relationship status: Living with partner  . Intimate partner violence:    Fear of current or ex partner: No    Emotionally abused: No    Physically abused: No    Forced sexual activity: No  Other Topics Concern  . Not on file  Social History Narrative  . Not on file    FAMILY HISTORY: Family History  Problem Relation Age of Onset  . Cancer Brother     ALLERGIES:  has No Known Allergies.  MEDICATIONS:  Current Facility-Administered Medications  Medication Dose Route Frequency Provider Last Rate Last Dose  . 0.9 %  sodium chloride infusion  250 mL Intravenous PRN Rizwan, Saima, MD      . 0.9 %  sodium chloride infusion  Intravenous Continuous Ventura Sellers, MD 20 mL/hr at 12/09/17 1100    . 0.9 %  sodium chloride infusion   Intravenous Once PRN Ventura Sellers, MD      . acetaminophen (TYLENOL) tablet 650 mg  650 mg Oral Q6H PRN Debbe Odea, MD       Or  . acetaminophen (TYLENOL) suppository 650 mg  650 mg Rectal Q6H PRN Rizwan, Saima, MD      . albuterol (PROVENTIL) (2.5 MG/3ML) 0.083% nebulizer solution 2.5 mg  2.5 mg Inhalation Q4H PRN Rizwan, Saima, MD      . albuterol (PROVENTIL) (2.5 MG/3ML) 0.083% nebulizer solution 2.5 mg  2.5 mg Nebulization Once PRN Ventura Sellers, MD      . allopurinol (ZYLOPRIM) tablet 300 mg  300 mg Oral Daily Debbe Odea, MD   300 mg at 12/12/17 1048  . alteplase (CATHFLO ACTIVASE) injection 2 mg  2 mg Intracatheter Once PRN Ventura Sellers, MD      .  atenolol (TENORMIN) tablet 50 mg  50 mg Oral Daily Debbe Odea, MD   50 mg at 12/12/17 1048  . dexamethasone (DECADRON) tablet 4 mg  4 mg Oral Daily Ventura Sellers, MD   4 mg at 12/12/17 1048  . diphenhydrAMINE (BENADRYL) capsule 25 mg  25 mg Oral QHS PRN Raiford Noble [place], DO   25 mg at 12/10/17 2125  . diphenhydrAMINE (BENADRYL) injection 25 mg  25 mg Intravenous Once PRN Kaikoa Magro, [name], MD      . diphenhydrAMINE (BENADRYL) injection 50 mg  50 mg Intravenous Once PRN Sallie Staron, [name], MD      . EPINEPHrine (ADRENALIN) 0.5 mg  0.5 mg Subcutaneous Once PRN Ventura Sellers, MD      . EPINEPHrine (ADRENALIN) 0.5 mg  0.5 mg Subcutaneous Once PRN Ventura Sellers, MD      . EPINEPHrine (ADRENALIN) 1 MG/10ML injection 0.25 mg  0.25 mg Intravenous Once PRN Ventura Sellers, MD      . EPINEPHrine (ADRENALIN) 1 MG/10ML injection 0.25 mg  0.25 mg Intravenous Once PRN Ventura Sellers, MD      . famotidine (PEPCID) IVPB 20 mg premix  20 mg Intravenous Once PRN Ventura Sellers, MD      . fluticasone furoate-vilanterol (BREO ELLIPTA) 200-25 MCG/INH 1 puff  1 puff Inhalation Daily Debbe Odea, MD   1 puff at 12/12/17 0919  . insulin aspart (novoLOG) injection 0-20 Units  0-20 Units Subcutaneous TID WC Raiford Noble [place], DO   2 Units at 12/11/17 1819  . insulin aspart (novoLOG) injection 0-5 Units  0-5 Units Subcutaneous QHS Raiford Noble [place], [place]   2 Units at 12/11/17 2124  . insulin aspart (novoLOG) injection 4 Units  4 Units Subcutaneous TID WC Raiford Noble [place], DO   4 Units at 12/12/17 7893  . insulin glargine (LANTUS) injection 18 Units  18 Units Subcutaneous Daily Raiford Noble [place], [place]   18 Units at 12/12/17 1047  . irbesartan (AVAPRO) tablet 150 mg  150 mg Oral Daily Debbe Odea, MD   150 mg at 12/12/17 1048  . leucovorin injection 16 mg  16 mg Intravenous Q6H Ventura Sellers, MD   16 mg at 12/12/17 1133  . levETIRAcetam (KEPPRA) tablet 500 mg  500 mg Oral BID Debbe Odea,  MD   500 mg at 12/12/17 1048  . methylPREDNISolone sodium succinate (SOLU-MEDROL) 125 mg/2 mL injection 125 mg  125 mg Intravenous Once PRN Cecil Cobbs  K, MD      . multivitamin with minerals tablet 1 tablet  1 tablet Oral Daily Debbe Odea, MD   1 tablet at 12/12/17 1048  . ondansetron (ZOFRAN) tablet 4 mg  4 mg Oral Q6H PRN Debbe Odea, MD       Or  . ondansetron (ZOFRAN) injection 4 mg  4 mg Intravenous Q6H PRN Rizwan, Saima, MD      . potassium chloride SA (K-DUR,KLOR-CON) CR tablet 40 mEq  40 mEq Oral BID Sheikh, [name], DO   40 mEq at 12/12/17 1048  . sodium bicarbonate 150 mEq in sterile water 1,000 mL infusion   Intravenous Continuous Ventura Sellers, MD 80 mL/hr at 12/12/17 0600    . sodium chloride flush (NS) 0.9 % injection 3 mL  3 mL Intravenous Q12H Debbe Odea, MD   3 mL at 12/12/17 1136  . sodium chloride flush (NS) 0.9 % injection 3 mL  3 mL Intravenous PRN Debbe Odea, MD        REVIEW OF SYSTEMS:   Constitutional: Denies fevers, chills or abnormal weight loss Eyes: Denies blurriness of vision Ears, nose, mouth, throat, and face: Denies mucositis or sore throat Respiratory: Denies cough, dyspnea or wheezes Cardiovascular: Denies palpitation, chest discomfort or lower extremity swelling Gastrointestinal:  Denies nausea, constipation, diarrhea GU: Denies dysuria or incontinence Skin: Denies abnormal skin rashes Neurological: Per HPI Musculoskeletal: Denies joint pain, back or neck discomfort. No decrease in ROM Behavioral/Psych: Denies anxiety, disturbance in thought content, and mood instability   PHYSICAL EXAMINATION: Vitals:   12/12/17 0919 12/12/17 1051  BP:  (!) 146/68  Pulse:  (!) 59  Resp:    Temp:    SpO2: 93%    KPS: 90. General: Alert, cooperative, pleasant, in no acute distress Head: Craniotomy scar noted, dry and intact. EENT: No conjunctival injection or scleral icterus. Oral mucosa moist Lungs: Mildly increased work of  breathing Cardiac: Regular rate and rhythm Abdomen: Soft, non-distended abdomen Skin: No rashes cyanosis or petechiae. Extremities: mild pitting edema b/l LE  NEUROLOGIC EXAM: Mental Status: Awake, alert, attentive to examiner. Oriented to self and environment. Language is fluent with intact comprehension.  Cranial Nerves: Visual acuity is grossly normal. Visual fields are full. Extra-ocular movements intact. No ptosis. Face is symmetric, tongue midline. Motor: Tone and bulk are normal. Power is full in both arms and legs. Reflexes are symmetric, no pathologic reflexes present. Intact finger to nose bilaterally Sensory: Intact to light touch and temperature Gait: Normal and tandem gait is normal.   LABORATORY DATA:  I have reviewed the data as listed Lab Results  Component Value Date   WBC 5.2 12/12/2017   HGB 12.7 (L) 12/12/2017   HCT 38.6 (L) 12/12/2017   MCV 97.7 12/12/2017   PLT 95 (L) 12/12/2017   Recent Labs    12/10/17 0345 12/11/17 0420 12/12/17 0533  NA 141 140 142  K 3.7 3.5 3.4*  CL 91* 91* 93*  CO2 40* 35* 37*  GLUCOSE 189* 235* 123*  BUN 24* 20 22  CREATININE 0.82 0.77 0.76  CALCIUM 8.4* 8.2* 8.1*  GFRNONAA >60 >60 >60  GFRAA >60 >60 >60  PROT 5.6* 5.2* 5.0*  ALBUMIN 2.9* 2.8* 2.8*  AST 32 41 66*  ALT 44 50* 87*  ALKPHOS 58 55 48  BILITOT 0.5 0.5 0.5     ASSESSMENT & PLAN:  Primary CNS Lymphoma  Mr. Fini is clinically stable today on leucovorin.  Today's MTX level pending, yesterday  was 0.41.  Updates for today: -Keep IV fluids at 80/hr  -Once MTX <0.10, can discharge to home with prior home meds, except decadron to 2mg  daily -Will plan port placement for next admission  1) Primary CNS Lymphoma: tissue confirmation by brain biopsy of presence of aggressive diffuse large B-cell lymphoma with no evidence of leptomeningeal or ocular spread.   -appropriate to proceed with C1 of high dose MTX with leucovorin rescue -MTX-R therapy C1D1 8/1:  Methotrexate 3.5g/m2 given over 4 hours  -Leucovorin 16mg  q6 hours 24 hours after MTX infusion -Rituxan can be given 24-48 hours after MTX infusion -daily labs with cbc, cmp, hepatitis panel -daily urine pH once it reaches target pH of >7 -daily MTX levels until MTX levels <0.10 -continue same supportive medications with exception of protonix which interferes with clearance of MTX   2) Focal seizures -Con't 500mg  BID Keppra  3) Reported sleep apnea/hypopnea -Uses non-pressurized oxygen 3L nocturnal.  Doesn't require O2 even with exertion during the day.   4) Hyperglycemia -now on standing insluin -diabetes coordinator following  We appreciate the opportunity to participate in the care of [name].   Will continue to follow daily during this admission.  Direct contact # is [phone].   All questions were answered. The patient knows to call the clinic with any problems, questions or concerns.  The total time spent in the encounter was 20 minutes and more than 50% was on counseling and review of test results     Ventura Sellers, MD 12/12/2017 1:15 PM

## 2017-12-12 NOTE — Progress Notes (Signed)
PROGRESS NOTE    Tim [name]  ZDG:[id number] DOB: [birth year] DOA: 12/07/2017 PCP: Leonard Downing, MD  Brief Narrative:  Tim [name] is a 62 y.o. male with medical history of of CNS Lymphoma, COPD, DM2, Gout, HTN and other comorbids who is being admitted for beginning of Rituximab and Methotrexate at the request of Dr Mickeal Skinner.   He has no complaints but his sugars were in the 500s. He had 2 sodas and a large lunch just now. He has been eating a lot of ice cream lately. He is not on medication for diabetes any more but was once on Metformin. He has been started on decadron for brain mets and since then sugars have been high. Started on IVF per Neuro-Oncology but unfortunately cannot have Chemotherapy started until Urine pH >7. Chemotherapy initiated by Neuro-Oncology and patient received MTX 12/09/17 and Rituxan 12/10/17. He received the Leucovorin Rescue yesterday. Patient has had no issues overnight.   Assessment & Plan:   Principal Problem:   Primary CNS lymphoma ([place]) Active Problems:   Essential hypertension   COPD  GOLD III    DM (diabetes mellitus), type 2 (H[facility])   Bilateral leg edema   Primary CNS Diffuse Large B-Cell Lymphoma  -On Steroids with Dexamethasone 4 mg po BID for cytotoxic edema and changed to Dexamethasone 4 mg po Daily by Neuro-Oncology  -On Levitriacetam 500 mg po BID as outpt - will continue -Chemotherapy with HD-Methotrexate with Leucovorin Resuce and Rituximab to be started by Oncology once Urine pH >7; Chemotherapy initiated by Neuro-Oncology 12/09/17 -Oncology checking MTX Level which was 27.72 and Hepatitis Panel Negative -Check Urine Dipstick per Oncology -C/w Daily CBC and CMP; Dr. Mickeal Skinner checking Daily MTX Levels until they are <0.10 -IVF per Neuro-Oncology   Essential Hypertension -C/w Atenolol 50 mg po Daily and Diovan substitution with Irbesartan 150 mg po Daily with holding parameters  Mild AKI/ Hyponatremia, improved - Cr was 0.82  on 09/15/17 and now is 1.19- sodium 132 - IV fluids being started by Dr Mauri Reading and is now on Sodium Bicarbonate 150 mEQ in Sterile Water at a rate of 125 mL/hr and has now cut rate to 80 mL/hr -BUN/Cr has improved and is now 22/0.76 -Na+ is improved to 142 -Continue to Monitor and Repeat CMP in AM   COPD  GOLD III  -Currently not in Exacerbation -C/w Breo Ellipta 200-25 mcg/INH 1 puff Daily  -C/w Albuterol 2.5 mg IH q4hprn Wheezing and SOB -Had to be placed on 3 Liters of O2 yesterday for anxiety related to dyspnea -Per Dr. Mickeal Skinner "Ok to use his established PRN oxygen (3L) for anxiety related dsypnea" -Continue to Monitor Respiratory Status   Uncontrolled DM (Diabetes Mellitus), type 2  -Sugar was high because he has drank 2 bottles of soda prior to coming in to the hospital along with taking Decadron -Moderate Novolog SSI AC/HS started but increased to Resistant Novolog SSI AC/HS -C/w Diabetic Diet and Strict Adherence -Change Sodium Bicarbonate to Sterile Water -Checked A1c and was 9.8  -Started Long Acting 12 units of Lantus Daily and increased to 18 units sq Daily -Also added Meal Coverage Insulin with 4 units TIDwc -Consult Diabetes Education Coordinator  -Continue to Monitor CBG's closely; CBG's imrpoving ranging from 82-230  Mild Thrombocytopenia -Platelet Count went fom 125 -> 131 -> 133 -> 114 -> 102 -> 108 -> 95 -Continue to Monitor for S/Sx of Bleeding -Continue to Monitor and Repeat CBC in AM  Leukocytosis, improved  -Mild as  WBC is 11.3; Now improved to 5.2  -In the Setting of Steroid Demargination -Continue to Monitor for S/Sx of Infection -Repeat CBC in AM   Hx of Gout -C/w Allopurinol 300 mg po Daily   Metabolic Alkalosis -Likely 2/2 to Sodium Bicarbonate Drip -CO2 steadily increased but now trended back down and is now 37 -Continue to Monitor and Repeat CMP in AM  Elevated BUN, improved -Likely 2/2 to Steroid Usage -BUN now 22 -Continue to Monitor and  repeat CMP in AM  Abnormal LFT's/Elevated LFT's -AST trended up to 66 and ALT went up to 87 -Likely from Chemotherapy -Continue to Monitor and Trend LFT's -Repeat CMP in AM  LE Edema -In the seting of IVF Resuscitation and Steroids -Does not Have an ECHO on file so will check and will also check LE Venous Duplex to r/o DVT; ECHO done and pending read -LE Venous Duplex Negative for DVT -Albumin Slightly low at 2.8 -Consult Nutrition too -Ordered TED HOSE -Continue to Monitor Clinically and may need IV Lasix if continues to worsen  Hypokalemia -Patient's potassium this morning was 3.4 -Replete with p.o. potassium chloride 40 mg twice daily x2 doses -Continue to monitor and replete as necessary -Repeat CMP in the a.m.  Normocytic Anemia -Patient's Hb/Hct is now 12.7/38.6 and likely dropped from Chemotherapy -Continue to Monitor for S/Sx of Bleeding -Check Anemia Panel in AM -Repeat CBC in AM   DVT prophylaxis: SCDs due to Thrombocytopenia  Code Status: FULL CODE Family Communication: Discussed with wife at bedside Disposition Plan: Pending Improvement and Clearance by Neuro-Oncology   Consultants:   Neuro-Oncology Dr. Mickeal Skinner   Procedures: None   Antimicrobials:  Anti-infectives (From admission, onward)   None     Subjective: Seen and examined and was doing okay.  No complaints and slept okay.  No chest pain, shortness breath, nausea, vomiting.  States that the leg swelling is slightly better.  No other concerns or complaints at time  Objective: Vitals:   12/11/17 2107 12/12/17 0500 12/12/17 0919 12/12/17 1051  BP: 137/75 (!) 142/75  (!) 146/68  Pulse: (!) 54 64  (!) 59  Resp: 18 20    Temp: 98.3 F (36.8 C) 98.4 F (36.9 C)    TempSrc: Oral Oral    SpO2: 94% 97% 93%   Weight:      Height:        Intake/Output Summary (Last 24 hours) at 12/12/2017 1119 Last data filed at 12/12/2017 0600 Gross per 24 hour  Intake 2822.98 ml  Output 1450 ml  Net 1372.98 ml     Filed Weights   12/08/17 0817  Weight: 111.9 kg (246 lb 11.2 oz)   Examination: Physical Exam:  Constitutional: Well-nourished, well-developed obese Caucasian male currently no acute distress appears calm comfortable sitting on the edge of the bed Eyes: Sclera anicteric.  Lids and conjunctive are normal ENMT: External ears and nose appear normal.  Grossly normal hearing Neck: Appears supple with no JVD Respiratory: Diminished to auscultation bilaterally no appreciable wheezing, rales, rhonchi.  Patient not tachypneic using accessory muscle breathe Cardiovascular: Regular rate and rhythm.  No appreciable murmurs, rubs, gallops.  Has 1+ lower extremity bilaterally and is wearing TED hose today Abdomen: Soft, nontender, distended secondary body habitus.  Bowel sounds present all 4 quadrants GU: Deferred Musculoskeletal: No contractures cyanosis.  No joint formation noted Skin: Skin is warm and dry no appreciable rashes or lesions limited skin evaluation Neurologic: Cranial nerves II through XII grossly intact no appreciable focal  deficits Psychiatric: Normal mood and affect.  Intact judgment and insight.  Patient is awake and alert and oriented x3  Data Reviewed: I have personally reviewed following labs and imaging studies  CBC: Recent Labs  Lab 12/08/17 0417 12/09/17 0349 12/10/17 0345 12/11/17 0420 12/12/17 0533  WBC 11.3* 7.7 6.2 6.9 5.2  NEUTROABS 9.1* 5.4 4.9 6.4 3.9  HGB 14.6 14.5 13.4 13.4 12.7*  HCT 42.3 42.1 39.5 40.0 38.6*  MCV 93.0 95.0 95.9 96.2 97.7  PLT 133* 114* 102* 108* 95*   Basic Metabolic Panel: Recent Labs  Lab 12/08/17 0417 12/09/17 0349 12/10/17 0345 12/11/17 0420 12/12/17 0533  NA 133* 136 141 140 142  K 3.8 3.8 3.7 3.5 3.4*  CL 92* 88* 91* 91* 93*  CO2 30 37* 40* 35* 37*  GLUCOSE 311* 234* 189* 235* 123*  BUN 27* 25* 24* 20 22  CREATININE 0.78 0.65 0.82 0.77 0.76  CALCIUM 8.7* 8.4* 8.4* 8.2* 8.1*  MG  --  2.2 2.3 2.2 2.6*  PHOS  --  3.4  4.5 4.0 4.1   GFR: Estimated Creatinine Clearance: 131 mL/min (by C-G formula based on SCr of 0.76 mg/dL). Liver Function Tests: Recent Labs  Lab 12/08/17 0417 12/09/17 0349 12/10/17 0345 12/11/17 0420 12/12/17 0533  AST 26 27 32 41 66*  ALT 44 36 44 50* 87*  ALKPHOS 73 65 58 55 48  BILITOT 0.7 0.8 0.5 0.5 0.5  PROT 6.5 5.8* 5.6* 5.2* 5.0*  ALBUMIN 3.5 3.0* 2.9* 2.8* 2.8*   No results for input(s): LIPASE, AMYLASE in the last 168 hours. No results for input(s): AMMONIA in the last 168 hours. Coagulation Profile: No results for input(s): INR, PROTIME in the last 168 hours. Cardiac Enzymes: No results for input(s): CKTOTAL, CKMB, CKMBINDEX, TROPONINI in the last 168 hours. BNP (last 3 results) Recent Labs    09/15/17 1730  PROBNP 49.0   HbA1C: No results for input(s): HGBA1C in the last 72 hours. CBG: Recent Labs  Lab 12/11/17 0750 12/11/17 1125 12/11/17 1703 12/11/17 2105 12/12/17 0735  GLUCAP 195* 101* 146* 230* 82   Lipid Profile: No results for input(s): CHOL, HDL, LDLCALC, TRIG, CHOLHDL, LDLDIRECT in the last 72 hours. Thyroid Function Tests: No results for input(s): TSH, T4TOTAL, FREET4, T3FREE, THYROIDAB in the last 72 hours. Anemia Panel: No results for input(s): VITAMINB12, FOLATE, FERRITIN, TIBC, IRON, RETICCTPCT in the last 72 hours. Sepsis Labs: No results for input(s): PROCALCITON, LATICACIDVEN in the last 168 hours.  No results found for this or any previous visit (from the past 240 hour(s)).   Radiology Studies: No results found.  Scheduled Meds: . allopurinol  300 mg Oral Daily  . atenolol  50 mg Oral Daily  . dexamethasone  4 mg Oral Daily  . fluticasone furoate-vilanterol  1 puff Inhalation Daily  . insulin aspart  0-20 Units Subcutaneous TID WC  . insulin aspart  0-5 Units Subcutaneous QHS  . insulin aspart  4 Units Subcutaneous TID WC  . insulin glargine  18 Units Subcutaneous Daily  . irbesartan  150 mg Oral Daily  . leucovorin  16  mg Intravenous Q6H  . levETIRAcetam  500 mg Oral BID  . multivitamin with minerals  1 tablet Oral Daily  . potassium chloride  40 mEq Oral BID  . sodium chloride flush  3 mL Intravenous Q12H   Continuous Infusions: . sodium chloride    . sodium chloride 20 mL/hr at 12/09/17 1100  . sodium chloride    .  famotidine    .  sodium bicarbonate (isotonic) infusion in sterile water 80 mL/hr at 12/12/17 0600    LOS: 5 days    Kerney Elbe, DO [facility] Pager [phone]  If 7PM-7AM, please contact night-coverage [email or web address] Password [email or web address] 12/12/2017, 11:19 AM

## 2017-12-12 NOTE — Progress Notes (Signed)
  Echocardiogram 2D Echocardiogram has been performed.  Laneshia Pina T Babette Stum 12/12/2017, 8:27 AM

## 2017-12-13 ENCOUNTER — Other Ambulatory Visit: Payer: Self-pay | Admitting: Internal Medicine

## 2017-12-13 ENCOUNTER — Other Ambulatory Visit: Payer: Self-pay | Admitting: *Deleted

## 2017-12-13 DIAGNOSIS — C8339 Primary central nervous system lymphoma: Secondary | ICD-10-CM

## 2017-12-13 DIAGNOSIS — C8589 Other specified types of non-Hodgkin lymphoma, extranodal and solid organ sites: Secondary | ICD-10-CM

## 2017-12-13 LAB — CBC WITH DIFFERENTIAL/PLATELET
Basophils Absolute: 0 10*3/uL (ref 0.0–0.1)
Basophils Relative: 0 %
EOS ABS: 0.1 10*3/uL (ref 0.0–0.7)
EOS PCT: 1 %
HCT: 40.3 % (ref 39.0–52.0)
HEMOGLOBIN: 13.1 g/dL (ref 13.0–17.0)
LYMPHS ABS: 1.4 10*3/uL (ref 0.7–4.0)
LYMPHS PCT: 24 %
MCH: 31.8 pg (ref 26.0–34.0)
MCHC: 32.5 g/dL (ref 30.0–36.0)
MCV: 97.8 fL (ref 78.0–100.0)
MONOS PCT: 2 %
Monocytes Absolute: 0.1 10*3/uL (ref 0.1–1.0)
NEUTROS PCT: 73 %
Neutro Abs: 4 10*3/uL (ref 1.7–7.7)
Platelets: 96 10*3/uL — ABNORMAL LOW (ref 150–400)
RBC: 4.12 MIL/uL — ABNORMAL LOW (ref 4.22–5.81)
RDW: 13.1 % (ref 11.5–15.5)
WBC: 5.6 10*3/uL (ref 4.0–10.5)

## 2017-12-13 LAB — URINALYSIS, DIPSTICK ONLY
Bilirubin Urine: NEGATIVE
Bilirubin Urine: NEGATIVE
GLUCOSE, UA: NEGATIVE mg/dL
Glucose, UA: NEGATIVE mg/dL
HGB URINE DIPSTICK: NEGATIVE
Hgb urine dipstick: NEGATIVE
Ketones, ur: NEGATIVE mg/dL
Ketones, ur: NEGATIVE mg/dL
LEUKOCYTES UA: NEGATIVE
Leukocytes, UA: NEGATIVE
NITRITE: NEGATIVE
Nitrite: NEGATIVE
PH: 9 — AB (ref 5.0–8.0)
PH: 9 — AB (ref 5.0–8.0)
Protein, ur: NEGATIVE mg/dL
Protein, ur: NEGATIVE mg/dL
SPECIFIC GRAVITY, URINE: 1.008 (ref 1.005–1.030)
SPECIFIC GRAVITY, URINE: 1.01 (ref 1.005–1.030)

## 2017-12-13 LAB — GLUCOSE, CAPILLARY
Glucose-Capillary: 106 mg/dL — ABNORMAL HIGH (ref 70–99)
Glucose-Capillary: 137 mg/dL — ABNORMAL HIGH (ref 70–99)

## 2017-12-13 LAB — COMPREHENSIVE METABOLIC PANEL
ALK PHOS: 47 U/L (ref 38–126)
ALT: 97 U/L — ABNORMAL HIGH (ref 0–44)
ANION GAP: 8 (ref 5–15)
AST: 56 U/L — ABNORMAL HIGH (ref 15–41)
Albumin: 2.7 g/dL — ABNORMAL LOW (ref 3.5–5.0)
BUN: 17 mg/dL (ref 8–23)
CO2: 35 mmol/L — ABNORMAL HIGH (ref 22–32)
CREATININE: 0.63 mg/dL (ref 0.61–1.24)
Calcium: 8.1 mg/dL — ABNORMAL LOW (ref 8.9–10.3)
Chloride: 97 mmol/L — ABNORMAL LOW (ref 98–111)
Glucose, Bld: 136 mg/dL — ABNORMAL HIGH (ref 70–99)
Potassium: 4 mmol/L (ref 3.5–5.1)
SODIUM: 140 mmol/L (ref 135–145)
TOTAL PROTEIN: 5 g/dL — AB (ref 6.5–8.1)
Total Bilirubin: 0.6 mg/dL (ref 0.3–1.2)

## 2017-12-13 LAB — METHOTREXATE
METHOTREXATE: 0.08
METHOTREXATE: 0.04

## 2017-12-13 LAB — MAGNESIUM: MAGNESIUM: 2.4 mg/dL (ref 1.7–2.4)

## 2017-12-13 LAB — PHOSPHORUS: PHOSPHORUS: 3.3 mg/dL (ref 2.5–4.6)

## 2017-12-13 MED ORDER — ALBUTEROL SULFATE HFA 108 (90 BASE) MCG/ACT IN AERS: 2.0000 | INHALATION_SPRAY | RESPIRATORY_TRACT | 0 refills | Status: DC | PRN

## 2017-12-13 MED ORDER — BLOOD GLUCOSE MONITOR KIT: PACK | 0 refills | Status: AC

## 2017-12-13 MED ORDER — PEN NEEDLES 31G X 5 MM MISC: 1.0000 | Freq: Every day | 0 refills | Status: DC

## 2017-12-13 MED ORDER — DEXAMETHASONE 4 MG PO TABS: 4.0000 mg | ORAL_TABLET | Freq: Every day | ORAL | 0 refills | Status: DC

## 2017-12-13 MED ORDER — INSULIN GLARGINE 100 UNIT/ML SOLOSTAR PEN: 18.0000 [IU] | PEN_INJECTOR | Freq: Every day | SUBCUTANEOUS | 11 refills | Status: DC

## 2017-12-13 NOTE — Progress Notes (Signed)
Patient needs portacath scheduled.  Scheduled for August 19th @ 8 am.

## 2017-12-13 NOTE — Progress Notes (Signed)
Initial Nutrition Assessment  DOCUMENTATION CODES:   Obesity unspecified  INTERVENTION:   Provided DM diet education and answered all of patient's diet related questions  NUTRITION DIAGNOSIS:   Increased nutrient needs related to chronic illness, cancer and cancer related treatments as evidenced by estimated needs.  GOAL:   Patient will meet greater than or equal to 90% of their needs  MONITOR:   PO intake, Labs, Weight trends, I & O's  REASON FOR ASSESSMENT:   Consult Assessment of nutrition requirement/status, Diet education  ASSESSMENT:   61 y.o. male with medical history of of CNS Lymphoma, COPD, DM2, Gout, HTN and other comorbids who is being admitted for beginning of Rituximab and Methotrexate at the request of Dr Mickeal Skinner.   Patient in room with wife at bedside. Pt reports eating well with good appetite. This is supported by 100% meal completions recorded in chart. Pt states he discussed diet with the DM coordinator and he knows he should limit his soda consumption and to portion out his ice cream. Reviewed protein foods with patient. Explained that his protein needs are increased given he is receiving chemotherapy. Pt denies any nutrition impact symptoms from chemo.   Per weight history, pt has lost 6 lb since 7/11 (2% wt loss x 3 weeks, insignificant for time frame). Reviewed appropriate protein supplements if patient notices drastic weight loss.   Medications: Decadron tablet daily, Multivitamin with minerals daily, IV Zofran PRN Labs reviewed: CBGs: 106 Mg/Phos WNL   NUTRITION - FOCUSED PHYSICAL EXAM:  Nutrition focused physical exam shows no sign of depletion of muscle mass or body fat.  Diet Order:   Diet Order           Diet Carb Modified Fluid consistency: Thin; Room service appropriate? Yes  Diet effective now          EDUCATION NEEDS:   Education needs have been addressed  Skin:  Skin Assessment: Reviewed RN Assessment  Last BM:   7/30  Height:   Ht Readings from Last 1 Encounters:  12/08/17 6\' 3"  (1.905 m)    Weight:   Wt Readings from Last 1 Encounters:  12/08/17 246 lb 11.2 oz (111.9 kg)    Ideal Body Weight:  89.1 kg  BMI:  Body mass index is 30.84 kg/m.  Estimated Nutritional Needs:   Kcal:  2100-2300  Protein:  120-130g  Fluid:  2.1L/day  Tim Bibles, MS, RD, LDN [facility] Pager: [phone] After Hours Pager: [phone]

## 2017-12-13 NOTE — Discharge Summary (Signed)
Physician Discharge Summary  Tim [name] FVC:[id number] DOB: 22-Feb-1956 DOA: 12/07/2017  PCP: Leonard Downing, MD  Admit date: 12/07/2017 Discharge date: 12/13/2017  Admitted From: Home Disposition: Home  Recommendations for Outpatient Follow-up:  1. Follow up with PCP in 1-2 weeks 2. Follow up with Neuro-Oncology Dr. Mickeal Skinner within 1 week 3. Have Dr. Mickeal Skinner or PCP follow-up for abnormal LFTs 4. Please obtain CMP/CBC, Mag, Phos in one week 5. Please follow up on the following pending results:  Home Health: No Equipment/Devices: None  Discharge Condition: Stable CODE STATUS: FULL CODE  Diet recommendation: Carb Modified Diet   Brief/Interim Summary: Tim Martin Spiceris a 62 y.o.malewith medical history ofof CNS Lymphoma, COPD, DM2, Gout, HTN and other comorbids who is being admitted for beginning of Rituximab and Methotrexate at the request of Dr Mickeal Skinner.   He had no complaints but his sugars were in the 500s. He had 2 sodas and a large lunch just now. He has been eating a lot of ice cream lately. He is not on medication for diabetes any more but was once on Metformin. He has been started on decadron for brain mets and since then sugars have been high. Patient was admitted for Chemotherapy and was started on IVF per Neuro-Oncology but unfortunately could not have Chemotherapy started until Urine pH >7. Chemotherapy initiated by Neuro-Oncology and patient received MTX 12/09/17 and Rituxan 12/10/17. He received the Leucovorin Rescue yesterday. Patient had night and his blood sugars have been controlled on insulin.  Diabetes education coordinator was consulted and getting the patient on insulin administration.  Patient was deemed medically stable and ready for discharge as his MTX level was less than 0.04.  Patient's LFTs were slightly elevated however this will be followed in outpatient setting by Dr. Mickeal Skinner.  Patient to also follow-up with primary care physician after discharge  and discussed with Dr. Fredricka Bonine about port placement for continued chemotherapy in the outpatient setting.  Discharge Diagnoses:  Principal Problem:   Primary CNS lymphoma ([place]) Active Problems:   Essential hypertension   COPD  GOLD III    DM (diabetes mellitus), type 2 (HCC)   Bilateral leg edema  Primary CNS Diffuse Large B-Cell Lymphoma  -On Steroids with Dexamethasone 4 mg po BID for cytotoxic edema and changed to Dexamethasone 4 mg po Daily by Neuro-Oncology and will continue. Dr. Mickeal Skinner to reduce to 2 mg Daily in the outpatient setting  -On Levitriacetam 500 mg po BID as outpt - will continue -Chemotherapy with HD-Methotrexate with Leucovorin Resuce and Rituximab to be started by Oncology once Urine pH >7; Chemotherapy initiated by Neuro-Oncology 12/09/17 -Oncology checked initial MTX Level which was 27.72 and Hepatitis Panel Negative -Check Urine Dipstick per Oncology -C/w Daily CBC and CMP; Dr. Mickeal Skinner checking Daily MTX Levels until they are <0.10; Repeat MTX level was 0.04 -IVF per Neuro-Oncology; Will be discontinued prior to D/C -Follow up with Neuro-Oncology Dr. Mickeal Skinner in the outpatient setting    Essential Hypertension -C/w Atenolol 50 mg po Daily and Diovan substitution with Irbesartan 150 mg po Daily with holding parameters; Resume Home   Mild AKI/ Hyponatremia, improved - Cr was 0.82 on 09/15/17 and now is 1.19- sodium 132 - IV fluids being started by Dr Mauri Reading and is now on Sodium Bicarbonate 150 mEQ in Sterile Water at a rate of 125 mL/hr and has now cut rate to 80 mL/hr and will be stopped at D/C -BUN/Cr has improved and is now 17/0.63 -Na+ is improved to 140 -Continue to  Monitor and Repeat CMP as an outpatient   COPD GOLD III  -Currently not in Exacerbation -C/w Breo Ellipta 200-25 mcg/INH 1 puff Daily  -C/w Albuterol 2.5 mg IH q4hprn Wheezing and SOB -Had to be placed on 3 Liters of O2 yesterday for anxiety related to dyspnea but now off -Per Dr. Mickeal Skinner "Ok to  use his established PRN oxygen (3L) for anxiety related dsypnea" -Continue to Monitor Respiratory Status as an outpatient  Uncontrolled DM (Diabetes Mellitus), type 2  -Sugar was high because he has drank 2 bottles of soda prior to coming in to the hospital along with taking Decadron -Moderate Novolog SSI AC/HS started but increased to Resistant Novolog SSI AC/HS -C/w Diabetic Diet and Strict Adherence -Change Sodium Bicarbonate to Sterile Water -Checked A1c and was 9.8  -Started Long Acting 12 units of Lantus Daily and increased to 18 units sq Daily -Also added Meal Coverage Insulin with 4 units TIDwc -Consult Diabetes Education Coordinator  -Continue to Monitor CBG's closely; CBG's imrpoving ranging from 79-168 -Will D/C on Insulin; Diabetic Education Coordinator to show how to use Long Acting Insulin  Mild Thrombocytopenia -Platelet Count went fom 125 -> 96 -Continue to Monitor for S/Sx of Bleeding -Continue to Monitor and Repeat CBC as an outpatient   Leukocytosis, improved  -Mild as WBC is 11.3; Now improved to 5.6  -In the Setting of Steroid Demargination -Continue to Monitor for S/Sx of Infection -Repeat CBC in AM   Hx of Gout -C/w Allopurinol 300 mg po Daily   Metabolic Alkalosis -Likely 2/2 to Sodium Bicarbonate Drip -CO2 steadily increased but now trended back down and is now 35 -Continue to Monitor and Repeat CMP in AM  Elevated BUN, improved -Likely 2/2 to Steroid Usage -BUN now 17 -Continue to Monitor and repeat CMP as an outpatient   Abnormal LFT's/Elevated LFT's -AST trended up to 66 and is now 56 and ALT went up to 87 and is now 97 -Likely from Chemotherapy -Continue to Monitor and Trend LFT's as an outpatient -Repeat CMP as an outpatient and Neuro-Oncology or PCP to follow  -Recommend RUQ U/S if not improving -Hepatitis Panel Negative   LE Edema, improving  -In the seting of IVF Resuscitation and Steroids -Does not Have an ECHO on file so will  check and will also check LE Venous Duplex to r/o DVT; ECHO done and showed Normal Systolic Function with EF of 60-65% with Wall motion normal and no Regional Wall Motion Abnormalities and there was no Regional Wall Motion Abnormalities -LE Venous Duplex Negative for DVT -Albumin Slightly low at 2.8 -Consulted Nutrition  -Ordered TED HOSE -Continue to Monitor Clinically and may need IV Lasix if continues to worsen -Follow up with PCP and Neuro-Oncology as an outpatient   Hypokalemia -Patient's potassium this morning was 4.0 -Continue to monitor and replete as necessary -Repeat CMP in the a.m.  Normocytic Anemia -Patient's Hb/Hct is now 13.1/40.3 and likely dropped from Chemotherapy -Continue to Monitor for S/Sx of Bleeding -Check Anemia Panel as an outpatient -Repeat CBC as an outpatient   Discharge Instructions  Discharge Instructions    Ambulatory referral to Nutrition and Diabetic Education   Complete by:  As directed    Uncontrolled Hyperglycemia   Call MD for:  difficulty breathing, headache or visual disturbances   Complete by:  As directed    Call MD for:  extreme fatigue   Complete by:  As directed    Call MD for:  hives   Complete by:  As directed    Call MD for:  persistant dizziness or light-headedness   Complete by:  As directed    Call MD for:  persistant nausea and vomiting   Complete by:  As directed    Call MD for:  redness, tenderness, or signs of infection (pain, swelling, redness, odor or green/yellow discharge around incision site)   Complete by:  As directed    Call MD for:  severe uncontrolled pain   Complete by:  As directed    Call MD for:  temperature >100.4   Complete by:  As directed    Diet - low sodium heart healthy   Complete by:  As directed    Diet Carb Modified   Complete by:  As directed    Discharge instructions   Complete by:  As directed    Follow up with PCP and Neuro-Oncology within 1 week. Take all medications as prescribed. If  symptoms change or worsen please return to the ED for evaluation.   Increase activity slowly   Complete by:  As directed      Allergies as of 12/13/2017   No Known Allergies     Medication List    TAKE these medications   acetaminophen 500 MG tablet Commonly known as:  TYLENOL Take 1,000 mg by mouth every 8 (eight) hours as needed (pain, fever, headache).   albuterol 108 (90 Base) MCG/ACT inhaler Commonly known as:  PROVENTIL HFA;VENTOLIN HFA Inhale 2 puffs into the lungs every 4 (four) hours as needed for wheezing or shortness of breath.   allopurinol 300 MG tablet Commonly known as:  ZYLOPRIM Take 300 mg by mouth daily.   atenolol 100 MG tablet Commonly known as:  TENORMIN Take 50 mg by mouth daily.   b complex vitamins tablet Take 1 tablet by mouth daily.   blood glucose meter kit and supplies Kit Dispense based on patient and insurance preference. Use up to four times daily as directed. (FOR ICD-9 250.00, 250.01).   budesonide-formoterol 160-4.5 MCG/ACT inhaler Commonly known as:  SYMBICORT Take 2 puffs first thing in am and then another 2 puffs about 12 hours later.   dexamethasone 4 MG tablet Commonly known as:  DECADRON Take 1 tablet (4 mg total) by mouth daily.   Insulin Glargine 100 UNIT/ML Solostar Pen Commonly known as:  LANTUS SOLOSTAR Inject 18 Units into the skin daily.   levETIRAcetam 500 MG tablet Commonly known as:  KEPPRA Take 1 tablet (500 mg total) by mouth 2 (two) times daily.   multivitamin tablet Take 1 tablet by mouth daily.   pantoprazole 40 MG tablet Commonly known as:  PROTONIX Take 1 tablet by mouth daily. Take while on Decadron   Pen Needles 31G X 5 MM Misc 1 Container by Does not apply route daily.   valsartan 160 MG tablet Commonly known as:  DIOVAN Take 1 tablet (160 mg total) by mouth daily.      Follow-up Information    Leonard Downing, MD. Call.   Specialty:  Family Medicine Why:  Follow up within 1 week   Contact information: [place] [address] [phone]        Ventura Sellers, MD. Call.   Specialties:  Psychiatry, Neurology, Oncology Why:  Follow up within 1 week Contact information: [place] [address] [phone]          No Known Allergies  Consultations:  Neuro-Oncology Dr. Mickeal Skinner  Diabetes Coordinator  Procedures/Studies: Mr Total Spine Mets Screening  Result Date: 11/25/2017 CLINICAL DATA:  Primary CNS lymphoma. EXAM: MRI TOTAL SPINE WITHOUT AND WITH CONTRAST TECHNIQUE: Multisequence MR imaging of the spine from the cervical spine to the sacrum was performed prior to and following IV contrast administration for evaluation of spinal metastatic disease. CONTRAST:  67m MULTIHANCE GADOBENATE DIMEGLUMINE 529 MG/ML IV SOLN COMPARISON:  PET-CT 09/10/2017.  Chest CT 09/16/2017. FINDINGS: MRI CERVICAL SPINE FINDINGS Alignment: Normal. Vertebrae: C7-T1 Schmorl's nodes. No acute fracture or suspicious osseous lesion. Cord: Normal cord signal and morphology. No abnormal intradural enhancement. Posterior Fossa, vertebral arteries, paraspinal tissues: Grossly unremarkable included posterior fossa and paraspinal soft tissues. Disc levels: Limited assessment of degenerative changes due to lack of axial imaging. Diffuse cervical facet arthrosis asymmetrically worse on the left than the right with particularly severe arthrosis and likely ankylosis on the left at C4-5. Widely patent spinal canal. MRI THORACIC SPINE FINDINGS Alignment:  Normal. Vertebrae: Scattered small T1 hyperintense vertebral body lesions consistent with benign hemangiomas. 9 mm T1 hypointense, T2/STIR hyperintense, enhancing lesion in the right aspect of the T3 vertebral body without abnormal FDG uptake on comparison PET-CT and with appearance on the prior chest CT potentially reflecting an atypical hemangioma. No other enhancing or suspicious osseous lesions. Cord: Normal  cord signal and morphology. Minimal linear enhancement along the distal aspect of the thoracic cord is likely vascular given lack of evidence of leptomeningeal disease elsewhere in the spine. Paraspinal and other soft tissues: Focally prominent extrapleural fat on the right at T9-10. Disc levels: Mild lower thoracic facet arthrosis without evidence of significant neural foraminal stenosis. No significant disc herniation identified within limitations of lack of axial imaging. MRI LUMBAR SPINE FINDINGS Segmentation: Transitional lumbosacral anatomy with partially lumbarized S1 and fully formed S1-2 disc. Rudimentary ribs at L1. Alignment: Trace retrolisthesis of L3 on L4. Mild lumbar dextroscoliosis. Vertebrae: Mild chronic central endplate depressions/Schmorl's node deformities throughout the lumbar spine. No acute fracture or suspicious osseous lesion. L2 vertebral hemangioma. Multilevel degenerative endplate changes including prominent type 2 changes at L3-4 and L4-5. Conus medullaris: Extends to the L1 level and appears normal. Paraspinal and other soft tissues: Unremarkable. Disc levels: Limited assessment of degenerative changes due to lack of axial imaging. Disc desiccation and moderate disc space narrowing from L3-4 to L5-S1 with disc bulging and facet arthrosis at these levels resulting in only mild neural foraminal stenosis and no significant spinal stenosis. IMPRESSION: 1. No evidence of lymphoma in the spine. 2. 9 mm lesion in the T3 vertebral body, favored to represent an atypical hemangioma or other benign entity. Electronically Signed   By: ALogan BoresM.D.   On: 11/25/2017 14:44   UKoreaScrotum W/doppler  Result Date: 11/25/2017 CLINICAL DATA:  CNS lymphoma EXAM: SCROTAL ULTRASOUND DOPPLER ULTRASOUND OF THE TESTICLES TECHNIQUE: Complete ultrasound examination of the testicles, epididymis, and other scrotal structures was performed. Color and spectral Doppler ultrasound were also utilized to evaluate  blood flow to the testicles. COMPARISON:  None. FINDINGS: Right testicle Measurements: 4 x 2 x 2.6 cm. 2 mm anechoic testicular mass likely reflecting a small cyst. No mass or microlithiasis visualized. Left testicle Measurements: 3.9 x 1.9 x 2.8 cm. No mass or microlithiasis visualized. Right epididymis:  Normal in size and appearance. Left epididymis:  Normal in size and appearance. Hydrocele:  None visualized. Varicocele:  None visualized. Pulsed Doppler interrogation of both testes demonstrates normal low resistance arterial and venous waveforms bilaterally. IMPRESSION: 1. No testicular mass or testicular torsion.  Electronically Signed   By: Kathreen Devoid   On: 11/25/2017 16:57    ECHOCARDIOGRAM ------------------------------------------------------------------- Study Conclusions  - Procedure narrative: Transthoracic echocardiography. Image   quality was fair. The study was technically difficult, as a   result of body habitus. - Left ventricle: The cavity size was normal. Systolic function was   normal. The estimated ejection fraction was in the range of 60%   to 65%. Wall motion was normal; there were no regional wall   motion abnormalities. - Mitral valve: There was mild regurgitation. - Left atrium: The atrium was mildly dilated. - Atrial septum: No defect or patent foramen ovale was identified. - Inferior vena cava: The vessel was dilated. The respirophasic   diameter changes were blunted (< 50%), consistent with elevated   central venous pressure. Estimated CVP 15 mmHg.   LE VENOUS DUPLEX LE venous duplex prelim: negative for DVT  Subjective: And examined at bedside and is doing well.  Slept okay and states his leg swelling is improved.  No chest pain, shortness breath, nausea, vomiting.  No other concerns at this time is ready to go home.   Discharge Exam: Vitals:   12/13/17 0531 12/13/17 0846  BP: (!) 146/75   Pulse: (!) 50   Resp: 20   Temp: 97.6 F (36.4 C)   SpO2:  94% 95%   Vitals:   12/12/17 1316 12/12/17 2113 12/13/17 0531 12/13/17 0846  BP: 122/73 140/79 (!) 146/75   Pulse: (!) 56 (!) 56 (!) 50   Resp: '20 20 20   ' Temp: 97.9 F (36.6 C) 98.2 F (36.8 C) 97.6 F (36.4 C)   TempSrc: Oral Oral Oral   SpO2: 90% 97% 94% 95%  Weight:      Height:       [place]: Pt is well-nourished, well-developed obese Caucasian male who is alert, awake, not in acute distress Cardiovascular: RRR, S1/S2 +, no rubs, no gallops Respiratory: Diminished bilaterally, no wheezing, no rhonchi Abdominal: Soft, NT, distended secondary body habitus, bowel sounds + Extremities: Mild to 1+ lower extremity edema, no cyanosis  The results of significant diagnostics from this hospitalization (including imaging, microbiology, ancillary and laboratory) are listed below for reference.    Microbiology: No results found for this or any previous visit (from the past 240 hour(s)).   Labs: BNP (last 3 results) No results for input(s): BNP in the last 8760 hours. Basic Metabolic Panel: Recent Labs  Lab 12/09/17 0349 12/10/17 0345 12/11/17 0420 12/12/17 0533 12/13/17 0503  NA 136 141 140 142 140  K 3.8 3.7 3.5 3.4* 4.0  CL 88* 91* 91* 93* 97*  CO2 37* 40* 35* 37* 35*  GLUCOSE 234* 189* 235* 123* 136*  BUN 25* 24* '20 22 17  ' CREATININE 0.65 0.82 0.77 0.76 0.63  CALCIUM 8.4* 8.4* 8.2* 8.1* 8.1*  MG 2.2 2.3 2.2 2.6* 2.4  PHOS 3.4 4.5 4.0 4.1 3.3   Liver Function Tests: Recent Labs  Lab 12/09/17 0349 12/10/17 0345 12/11/17 0420 12/12/17 0533 12/13/17 0503  AST 27 32 41 66* 56*  ALT 36 44 50* 87* 97*  ALKPHOS 65 58 55 48 47  BILITOT 0.8 0.5 0.5 0.5 0.6  PROT 5.8* 5.6* 5.2* 5.0* 5.0*  ALBUMIN 3.0* 2.9* 2.8* 2.8* 2.7*   No results for input(s): LIPASE, AMYLASE in the last 168 hours. No results for input(s): AMMONIA in the last 168 hours. CBC: Recent Labs  Lab 12/09/17 0349 12/10/17 0345 12/11/17 0420 12/12/17 0533 12/13/17 0503  WBC  7.7 6.2 6.9 5.2 5.6   NEUTROABS 5.4 4.9 6.4 3.9 4.0  HGB 14.5 13.4 13.4 12.7* 13.1  HCT 42.1 39.5 40.0 38.6* 40.3  MCV 95.0 95.9 96.2 97.7 97.8  PLT 114* 102* 108* 95* 96*   Cardiac Enzymes: No results for input(s): CKTOTAL, CKMB, CKMBINDEX, TROPONINI in the last 168 hours. BNP: Invalid input(s): POCBNP CBG: Recent Labs  Lab 12/12/17 0735 12/12/17 1152 12/12/17 1709 12/12/17 2112 12/13/17 0710  GLUCAP 82 79 168* 109* 106*   D-Dimer No results for input(s): DDIMER in the last 72 hours. Hgb A1c No results for input(s): HGBA1C in the last 72 hours. Lipid Profile No results for input(s): CHOL, HDL, LDLCALC, TRIG, CHOLHDL, LDLDIRECT in the last 72 hours. Thyroid function studies No results for input(s): TSH, T4TOTAL, T3FREE, THYROIDAB in the last 72 hours.  Invalid input(s): FREET3 Anemia work up No results for input(s): VITAMINB12, FOLATE, FERRITIN, TIBC, IRON, RETICCTPCT in the last 72 hours. Urinalysis    Component Value Date/Time   COLORURINE STRAW (A) 12/13/2017 0433   APPEARANCEUR CLEAR 12/13/2017 0433   LABSPEC 1.010 12/13/2017 0433   PHURINE 9.0 (H) 12/13/2017 0433   GLUCOSEU NEGATIVE 12/13/2017 0433   HGBUR NEGATIVE 12/13/2017 0433   BILIRUBINUR NEGATIVE 12/13/2017 0433   KETONESUR NEGATIVE 12/13/2017 0433   PROTEINUR NEGATIVE 12/13/2017 0433   UROBILINOGEN 0.2 03/28/2007 1459   NITRITE NEGATIVE 12/13/2017 0433   LEUKOCYTESUR NEGATIVE 12/13/2017 0433   Sepsis Labs Invalid input(s): PROCALCITONIN,  WBC,  LACTICIDVEN Microbiology No results found for this or any previous visit (from the past 240 hour(s)).  Time coordinating discharge: 35 minutes  SIGNED:  Kerney Elbe, DO [facility] 12/13/2017, 12:20 PM Pager [phone]  If 7PM-7AM, please contact night-coverage [email or web address] Password [email or web address]

## 2017-12-13 NOTE — Care Management Important Message (Signed)
Important Message  Patient Details  Name: ZANDON TALTON MRN: [id number] Date of Birth: 1955/05/31   Medicare Important Message Given:  Yes    Kerin Salen 12/13/2017, 12:32 [place] Message  Patient Details  Name: JOEANGEL JEANPAUL MRN: [id number] Date of Birth: Jan 18, 1956   Medicare Important Message Given:  Yes    Kerin Salen 12/13/2017, 12:32 PM

## 2017-12-14 ENCOUNTER — Telehealth: Payer: Self-pay | Admitting: *Deleted

## 2017-12-14 ENCOUNTER — Other Ambulatory Visit: Payer: Self-pay | Admitting: Internal Medicine

## 2017-12-14 MED ORDER — METFORMIN HCL 500 MG PO TABS: 500.0000 mg | ORAL_TABLET | Freq: Two times a day (BID) | ORAL | 3 refills | Status: DC

## 2017-12-14 NOTE — Telephone Encounter (Signed)
Patient called stated that he attempted to get his Insulin filled and it was going to cost him approximately $500.00 to get it filled.  He also reports he checked at a different location and was told the same price.    Patient is requesting to try with diet alternation, close daily monitoring and along with oral metformin (which he reports he has taken before) to avoid the out of pocket costs associated with the insulin.    Per Dr Mickeal Skinner ordered Metformin 500 mg BID with instructions for patient to monitor and achieve normal glucose levels at home.  Patient expressed understanding and denies having any questions.

## 2017-12-24 ENCOUNTER — Other Ambulatory Visit: Payer: Self-pay | Admitting: Radiology

## 2017-12-27 ENCOUNTER — Ambulatory Visit (HOSPITAL_COMMUNITY)
Admission: RE | Admit: 2017-12-27 | Discharge: 2017-12-27 | Disposition: A | Discharge status: Home / Self Care | Payer: Medicare Other | Source: Ambulatory Visit | Attending: Surgery | Admitting: Surgery

## 2017-12-27 ENCOUNTER — Encounter (HOSPITAL_COMMUNITY): Payer: Self-pay

## 2017-12-27 ENCOUNTER — Ambulatory Visit (HOSPITAL_COMMUNITY)
Admission: RE | Admit: 2017-12-27 | Discharge: 2017-12-27 | Disposition: A | Discharge status: Home / Self Care | Payer: Medicare Other | Source: Ambulatory Visit | Attending: Internal Medicine | Admitting: Internal Medicine

## 2017-12-27 ENCOUNTER — Inpatient Hospital Stay (HOSPITAL_COMMUNITY): Admission: RE | Admit: 2017-12-27 | Payer: Medicare Other | Source: Ambulatory Visit

## 2017-12-27 DIAGNOSIS — Z79899 Other long term (current) drug therapy: Secondary | ICD-10-CM | POA: Diagnosis not present

## 2017-12-27 DIAGNOSIS — M1990 Unspecified osteoarthritis, unspecified site: Secondary | ICD-10-CM | POA: Insufficient documentation

## 2017-12-27 DIAGNOSIS — E119 Type 2 diabetes mellitus without complications: Secondary | ICD-10-CM | POA: Insufficient documentation

## 2017-12-27 DIAGNOSIS — J449 Chronic obstructive pulmonary disease, unspecified: Secondary | ICD-10-CM | POA: Diagnosis not present

## 2017-12-27 DIAGNOSIS — Z794 Long term (current) use of insulin: Secondary | ICD-10-CM | POA: Diagnosis not present

## 2017-12-27 DIAGNOSIS — C8589 Other specified types of non-Hodgkin lymphoma, extranodal and solid organ sites: Secondary | ICD-10-CM | POA: Insufficient documentation

## 2017-12-27 DIAGNOSIS — Z87891 Personal history of nicotine dependence: Secondary | ICD-10-CM | POA: Diagnosis not present

## 2017-12-27 DIAGNOSIS — Z9889 Other specified postprocedural states: Secondary | ICD-10-CM | POA: Diagnosis not present

## 2017-12-27 DIAGNOSIS — Z809 Family history of malignant neoplasm, unspecified: Secondary | ICD-10-CM | POA: Insufficient documentation

## 2017-12-27 DIAGNOSIS — M109 Gout, unspecified: Secondary | ICD-10-CM | POA: Diagnosis not present

## 2017-12-27 DIAGNOSIS — I10 Essential (primary) hypertension: Secondary | ICD-10-CM | POA: Insufficient documentation

## 2017-12-27 HISTORY — PX: IR IMAGING GUIDED PORT INSERTION: IMG5740

## 2017-12-27 LAB — PROTIME-INR
INR: 0.92
Prothrombin Time: 12.2 seconds (ref 11.4–15.2)

## 2017-12-27 LAB — CBC
HEMATOCRIT: 41.1 % (ref 39.0–52.0)
Hemoglobin: 14.5 g/dL (ref 13.0–17.0)
MCH: 32.2 pg (ref 26.0–34.0)
MCHC: 35.3 g/dL (ref 30.0–36.0)
MCV: 91.1 fL (ref 78.0–100.0)
PLATELETS: 229 10*3/uL (ref 150–400)
RBC: 4.51 MIL/uL (ref 4.22–5.81)
RDW: 13 % (ref 11.5–15.5)
WBC: 5.6 10*3/uL (ref 4.0–10.5)

## 2017-12-27 LAB — GLUCOSE, CAPILLARY: Glucose-Capillary: 312 mg/dL — ABNORMAL HIGH (ref 70–99)

## 2017-12-27 LAB — APTT: APTT: 21 s — AB (ref 24–36)

## 2017-12-27 MED ORDER — LIDOCAINE HCL 1 % IJ SOLN
INTRAMUSCULAR | Status: AC | PRN
  Administered 2017-12-27: 15 mL

## 2017-12-27 MED ORDER — HEPARIN SOD (PORK) LOCK FLUSH 100 UNIT/ML IV SOLN
INTRAVENOUS | Status: AC | PRN
  Administered 2017-12-27: 500 [IU] via INTRAVENOUS

## 2017-12-27 MED ORDER — CEFAZOLIN SODIUM-DEXTROSE 2-4 GM/100ML-% IV SOLN
INTRAVENOUS | Status: AC
  Administered 2017-12-27: 2 g via INTRAVENOUS
  Filled 2017-12-27: qty 100

## 2017-12-27 MED ORDER — LIDOCAINE HCL 1 % IJ SOLN
INTRAMUSCULAR | Status: AC
  Filled 2017-12-27: qty 20

## 2017-12-27 MED ORDER — CEFAZOLIN SODIUM-DEXTROSE 2-4 GM/100ML-% IV SOLN
2.0000 g | Freq: Once | INTRAVENOUS | Status: AC
  Administered 2017-12-27: 2 g via INTRAVENOUS

## 2017-12-27 MED ORDER — MIDAZOLAM HCL 2 MG/2ML IJ SOLN
INTRAMUSCULAR | Status: AC | PRN
  Administered 2017-12-27 (×3): 1 mg via INTRAVENOUS

## 2017-12-27 MED ORDER — NALOXONE HCL 0.4 MG/ML IJ SOLN
INTRAMUSCULAR | Status: AC
  Filled 2017-12-27: qty 1

## 2017-12-27 MED ORDER — FLUMAZENIL 0.5 MG/5ML IV SOLN
INTRAVENOUS | Status: AC
  Filled 2017-12-27: qty 5

## 2017-12-27 MED ORDER — FENTANYL CITRATE (PF) 100 MCG/2ML IJ SOLN
INTRAMUSCULAR | Status: AC
  Filled 2017-12-27: qty 4

## 2017-12-27 MED ORDER — FENTANYL CITRATE (PF) 100 MCG/2ML IJ SOLN
INTRAMUSCULAR | Status: AC | PRN
  Administered 2017-12-27 (×2): 50 ug via INTRAVENOUS

## 2017-12-27 MED ORDER — HEPARIN SOD (PORK) LOCK FLUSH 100 UNIT/ML IV SOLN
INTRAVENOUS | Status: AC
  Filled 2017-12-27: qty 5

## 2017-12-27 MED ORDER — SODIUM CHLORIDE 0.9 % IV SOLN
INTRAVENOUS | Status: DC
  Administered 2017-12-27: 09:00:00 via INTRAVENOUS

## 2017-12-27 MED ORDER — MIDAZOLAM HCL 2 MG/2ML IJ SOLN
INTRAMUSCULAR | Status: AC
  Filled 2017-12-27: qty 4

## 2017-12-27 NOTE — Sedation Documentation (Signed)
Patient is snoring

## 2017-12-27 NOTE — H&P (Signed)
Chief Complaint: Patient was seen in consultation today for Port-A-Cath placement  Referring Physician(s): Vaslow,Zachary K  Supervising Physician: Aletta Edouard  Patient Status: [facility] - Out-pt  History of Present Illness: Tim [name] is a 62 y.o. male with history of primary CNS lymphoma who presents today for Port-A-Cath placement for chemotherapy.  Past Medical History:  Diagnosis Date  . Arthritis   . Brain tumor ([place])   . COPD (chronic obstructive pulmonary disease) ([place])   . Diabetes mellitus without complication [facility])    patient states he was taken off meds at last appointment with PCP and is diet controlled  . Dyspnea   . Gout   . Hypertension 08/02/2017  . Pneumonia     Past Surgical History:  Procedure Laterality Date  . APPLICATION OF CRANIAL NAVIGATION Left 08/30/2017   Procedure: APPLICATION OF CRANIAL NAVIGATION;  Surgeon: Erline Levine, MD;  Location: [place];  Service: Neurosurgery;  Laterality: Left;  . STERIOTACTIC STIMULATOR INSERTION Left 08/30/2017   Procedure: LEFT Frontal Sterotactic Brain Biopsy with BrainLab;  Surgeon: Erline Levine, MD;  Location: [place];  Service: Neurosurgery;  Laterality: Left;    Allergies: Patient has no known allergies.  Medications: Prior to Admission medications   Medication Sig Start Date End Date Taking? Authorizing Provider  acetaminophen (TYLENOL) 500 MG tablet Take 1,000 mg by mouth every 8 (eight) hours as needed (pain, fever, headache).     [provider]  albuterol (PROVENTIL HFA;VENTOLIN HFA) 108 (90 Base) MCG/ACT inhaler Inhale 2 puffs into the lungs every 4 (four) hours as needed for wheezing or shortness of breath. 12/13/17   Raiford Noble Latif, DO  allopurinol (ZYLOPRIM) 300 MG tablet Take 300 mg by mouth daily.    [provider]  atenolol (TENORMIN) 100 MG tablet Take 50 mg by mouth daily.    [provider]  b complex vitamins tablet Take 1 tablet by mouth daily.     [provider]  blood glucose meter kit and supplies KIT Dispense based on patient and insurance preference. Use up to four times daily as directed. (FOR ICD-9 250.00, 250.01). 12/13/17   Raiford Noble Latif, DO  budesonide-formoterol [facility]) 160-4.5 MCG/ACT inhaler Take 2 puffs first thing in am and then another 2 puffs about 12 hours later. 09/24/17   Tanda Rockers, MD  dexamethasone (DECADRON) 4 MG tablet Take 1 tablet (4 mg total) by mouth daily. 12/13/17 01/12/18  Raiford Noble Latif, DO  Insulin Glargine (LANTUS SOLOSTAR) 100 UNIT/ML Solostar Pen Inject 18 Units into the skin daily. 12/13/17   Sheikh, [name], DO  Insulin Pen Needle (PEN NEEDLES) 31G X 5 MM MISC 1 Container by Does not apply route daily. 12/13/17   Sheikh, [name], DO  levETIRAcetam (KEPPRA) 500 MG tablet Take 1 tablet (500 mg total) by mouth 2 (two) times daily. 11/18/17   Ventura Sellers, MD  metFORMIN (GLUCOPHAGE) 500 MG tablet Take 1 tablet (500 mg total) by mouth 2 (two) times daily with a meal. 12/14/17   Vaslow, [name], MD  Multiple Vitamin (MULTIVITAMIN) tablet Take 1 tablet by mouth daily.    [provider]  pantoprazole (PROTONIX) 40 MG tablet Take 1 tablet by mouth daily. Take while on Decadron 11/10/17 11/10/18  [provider]  valsartan (DIOVAN) 160 MG tablet Take 1 tablet (160 mg total) by mouth daily. 08/24/17   Tanda Rockers, MD     Family History  Problem Relation Age of Onset  .  Cancer Brother     Social History   Socioeconomic History  . Marital status: Significant Other    Spouse name: [name]  . Number of children: Not on file  . Years of education: 21  . Highest education level: Bachelor's degree (e.g., BA, AB, BS)  Occupational History  . 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 Financial resource strain: Not hard at all  . Food insecurity:    Worry: Never true    Inability: Never true  . Transportation needs:     Medical: No    Non-medical: No  Tobacco Use  . Smoking status: Former Smoker    Packs/day: 2.00    Years: 40.00    Pack years: 80.00    Types: Cigarettes    Last attempt to quit: 08/10/2011    Years since quitting: 6.3  . Smokeless tobacco: Never Used  Substance and Sexual Activity  . Alcohol use: Yes    Alcohol/week: 14.0 standard drinks    Types: 14 Cans of beer per week    Frequency: Never  . Drug use: Never  . Sexual activity: Not Currently  Lifestyle  . Physical activity:    Days per week: 0 days    Minutes per session: 0 min  . Stress: Only a little  Relationships  . Social connections:    Talks on phone: Once a week    Gets together: Once a week    Attends religious service: Never    Active member of club or organization: No    Attends meetings of clubs or organizations: Never    Relationship status: Living with partner  Other Topics Concern  . Not on file  Social History Narrative  . Not on file      Review of Systems :denies fever, headache, chest pain, cough, abdominal/back pain, nausea, or bleeding; he does have fatigue and dyspnea with exertion.  Vital Signs: Blood pressure 143/70, HR 70, respirations 20, O2 sat 96% room air   Physical Exam :awake, alert.  Chest with distant breath sounds bilaterally.  Heart with regular rate and rhythm.  Abdomen soft, positive bowel sounds, nontender;  bilat lower extremity edema noted  Imaging: No results found.  Labs:  CBC: Recent Labs    12/10/17 0345 12/11/17 0420 12/12/17 0533 12/13/17 0503  WBC 6.2 6.9 5.2 5.6  HGB 13.4 13.4 12.7* 13.1  HCT 39.5 40.0 38.6* 40.3  PLT 102* 108* 95* 96*    COAGS: No results for input(s): INR, APTT in the last 8760 hours.  BMP: Recent Labs    12/10/17 0345 12/11/17 0420 12/12/17 0533 12/13/17 0503  NA 141 140 142 140  K 3.7 3.5 3.4* 4.0  CL 91* 91* 93* 97*  CO2 40* 35* 37* 35*  GLUCOSE 189* 235* 123* 136*  BUN 24* _0 CALCIUM 8.4* 8.2* 8.1* 8.1*    CREATININE 0.82 0.77 0.76 0.63  GFRNONAA >60 >60 >60 >60  GFRAA >60 >60 >60 >60    LIVER FUNCTION TESTS: Recent Labs    12/10/17 0345 12/11/17 0420 12/12/17 0533 12/13/17 0503  BILITOT 0.5 0.5 0.5 0.6  AST 32 41 66* 56*  ALT 44 50* 87* 97*  ALKPHOS 58 55 48 47  PROT 5.6* 5.2* 5.0* 5.0*  ALBUMIN 2.9* 2.8* 2.8* 2.7*    TUMOR MARKERS: No results for input(s): AFPTM, CEA, CA199, CHROMGRNA in the last 8760 hours.  Assessment and Plan: 62 y.o. male  with history of primary CNS lymphoma who presents today for Port-A-Cath placement for chemotherapy.Risks and benefits of image guided port-a-catheter placement was discussed with the patient including, but not limited to bleeding, infection, pneumothorax, or fibrin sheath development and need for additional procedures.  All of the patient's questions were answered, patient is agreeable to proceed. Consent signed and in chart.  LABS PENDING   Thank you for this interesting consult.  I greatly enjoyed meeting Dupree L Sidle and look forward to participating in their care.  A copy of this report was sent to the requesting provider on this date.  Electronically Signed: D. Kevin , PA-C 12/27/2017, 8:37 AM   I spent a total of  25 minutes   in face to face in clinical consultation, greater than 50% of which was counseling/coordinating care for port a cath placement  

## 2017-12-27 NOTE — Procedures (Signed)
Interventional Radiology Procedure Note  Procedure: Single Lumen Power Port Placement    Access:  Right IJ vein.  Findings: Catheter tip positioned at SVC/RA junction. Port is ready for immediate use.   Complications: None  EBL: < 10 mL  Recommendations:  - Ok to shower in 24 hours - Do not submerge for 7 days - Routine line care   Aurora Rody T. Jarius Dieudonne, M.D Pager:  [phone]   

## 2017-12-27 NOTE — Discharge Instructions (Signed)
Implanted Port Home Guide °An implanted port is a type of central line that is placed under the skin. Central lines are used to provide IV access when treatment or nutrition needs to be given through a person’s veins. Implanted ports are used for long-term IV access. An implanted port may be placed because: °· You need IV medicine that would be irritating to the small veins in your hands or arms. °· You need long-term IV medicines, such as antibiotics. °· You need IV nutrition for a long period. °· You need frequent blood draws for lab tests. °· You need dialysis. ° °Implanted ports are usually placed in the chest area, but they can also be placed in the upper arm, the abdomen, or the leg. An implanted port has two main parts: °· Reservoir. The reservoir is round and will appear as a small, raised area under your skin. The reservoir is the part where a needle is inserted to give medicines or draw blood. °· Catheter. The catheter is a thin, flexible tube that extends from the reservoir. The catheter is placed into a large vein. Medicine that is inserted into the reservoir goes into the catheter and then into the vein. ° °How will I care for my incision site? °Do not get the incision site wet. Bathe or shower as directed by your health care provider. °How is my port accessed? °Special steps must be taken to access the port: °· Before the port is accessed, a numbing cream can be placed on the skin. This helps numb the skin over the port site. °· Your health care provider uses a sterile technique to access the port. °? Your health care provider must put on a mask and sterile gloves. °? The skin over your port is cleaned carefully with an antiseptic and allowed to dry. °? The port is gently pinched between sterile gloves, and a needle is inserted into the port. °· Only "non-coring" port needles should be used to access the port. Once the port is accessed, a blood return should be checked. This helps ensure that the port  is in the vein and is not clogged. °· If your port needs to remain accessed for a constant infusion, a clear (transparent) bandage will be placed over the needle site. The bandage and needle will need to be changed every week, or as directed by your health care provider. °· Keep the bandage covering the needle clean and dry. Do not get it wet. Follow your health care provider’s instructions on how to take a shower or bath while the port is accessed. °· If your port does not need to stay accessed, no bandage is needed over the port. ° °What is flushing? °Flushing helps keep the port from getting clogged. Follow your health care provider’s instructions on how and when to flush the port. Ports are usually flushed with saline solution or a medicine called heparin. The need for flushing will depend on how the port is used. °· If the port is used for intermittent medicines or blood draws, the port will need to be flushed: °? After medicines have been given. °? After blood has been drawn. °? As part of routine maintenance. °· If a constant infusion is running, the port may not need to be flushed. ° °How long will my port stay implanted? °The port can stay in for as long as your health care provider thinks it is needed. When it is time for the port to come out, surgery will be   done to remove it. The procedure is similar to the one performed when the port was put in. °When should I seek immediate medical care? °When you have an implanted port, you should seek immediate medical care if: °· You notice a bad smell coming from the incision site. °· You have swelling, redness, or drainage at the incision site. °· You have more swelling or pain at the port site or the surrounding area. °· You have a fever that is not controlled with medicine. ° °This information is not intended to replace advice given to you by your health care provider. Make sure you discuss any questions you have with your health care provider. °Document  Released: 04/27/2005 Document Revised: 10/03/2015 Document Reviewed: 01/02/2013 °Elsevier Interactive Patient Education © 2017 Elsevier Inc[employer] °Moderate Conscious Sedation, Adult, Care After °These instructions provide you with information about caring for yourself after your procedure. Your health care provider may also give you more specific instructions. Your treatment has been planned according to current medical practices, but problems sometimes occur. Call your health care provider if you have any problems or questions after your procedure. °What can I expect after the procedure? °After your procedure, it is common: °· To feel sleepy for several hours. °· To feel clumsy and have poor balance for several hours. °· To have poor judgment for several hours. °· To vomit if you eat too soon. ° °Follow these instructions at home: °For at least 24 hours after the procedure: ° °· Do not: °? Participate in activities where you could fall or become injured. °? Drive. °? Use heavy machinery. °? Drink alcohol. °? Take sleeping pills or medicines that cause drowsiness. °? Make important decisions or sign legal documents. °? Take care of children on your own. °· Rest. °Eating and drinking °· Follow the diet recommended by your health care provider. °· If you vomit: °? Drink water, juice, or soup when you can drink without vomiting. °? Make sure you have little or no nausea before eating solid foods. °General instructions °· Have a responsible adult stay with you until you are awake and alert. °· Take over-the-counter and prescription medicines only as told by your health care provider. °· If you smoke, do not smoke without supervision. °· Keep all follow-up visits as told by your health care provider. This is important. °Contact a health care provider if: °· You keep feeling nauseous or you keep vomiting. °· You feel light-headed. °· You develop a rash. °· You have a fever. °Get help right away if: °· You have trouble  breathing. °This information is not intended to replace advice given to you by your health care provider. Make sure you discuss any questions you have with your health care provider. °Document Released: 02/15/2013 Document Revised: 09/30/2015 Document Reviewed: 08/17/2015 °Elsevier Interactive Patient Education © 2018 Elsevier Inc. ° °

## 2017-12-28 ENCOUNTER — Other Ambulatory Visit: Payer: Self-pay | Admitting: Internal Medicine

## 2017-12-28 MED ORDER — SODIUM BICARBONATE 650 MG PO TABS: 1300.0000 mg | ORAL_TABLET | Freq: Three times a day (TID) | ORAL | 0 refills | Status: DC

## 2018-01-03 ENCOUNTER — Other Ambulatory Visit: Payer: Self-pay | Admitting: *Deleted

## 2018-01-03 ENCOUNTER — Encounter (HOSPITAL_COMMUNITY): Payer: Self-pay

## 2018-01-03 ENCOUNTER — Other Ambulatory Visit: Payer: Self-pay | Admitting: Internal Medicine

## 2018-01-03 ENCOUNTER — Other Ambulatory Visit: Payer: Self-pay

## 2018-01-03 ENCOUNTER — Inpatient Hospital Stay (HOSPITAL_COMMUNITY)
Admission: AD | Admit: 2018-01-03 | Discharge: 2018-01-07 | DRG: 846 | Disposition: A | Discharge status: Home / Self Care | Payer: Medicare Other | Source: Ambulatory Visit | Attending: Internal Medicine | Admitting: Internal Medicine

## 2018-01-03 ENCOUNTER — Inpatient Hospital Stay: Payer: Medicare Other | Attending: Internal Medicine

## 2018-01-03 ENCOUNTER — Encounter: Payer: Self-pay | Admitting: Internal Medicine

## 2018-01-03 ENCOUNTER — Inpatient Hospital Stay (HOSPITAL_BASED_OUTPATIENT_CLINIC_OR_DEPARTMENT_OTHER): Payer: Medicare Other | Admitting: Internal Medicine

## 2018-01-03 VITALS — BP 126/69 | HR 69 | Temp 98.2°F | Resp 18 | Wt 245.2 lb

## 2018-01-03 DIAGNOSIS — R569 Unspecified convulsions: Secondary | ICD-10-CM | POA: Diagnosis present

## 2018-01-03 DIAGNOSIS — Z794 Long term (current) use of insulin: Secondary | ICD-10-CM | POA: Insufficient documentation

## 2018-01-03 DIAGNOSIS — D696 Thrombocytopenia, unspecified: Secondary | ICD-10-CM | POA: Diagnosis present

## 2018-01-03 DIAGNOSIS — Z5111 Encounter for antineoplastic chemotherapy: Secondary | ICD-10-CM | POA: Diagnosis present

## 2018-01-03 DIAGNOSIS — G4730 Sleep apnea, unspecified: Secondary | ICD-10-CM | POA: Diagnosis not present

## 2018-01-03 DIAGNOSIS — C8589 Other specified types of non-Hodgkin lymphoma, extranodal and solid organ sites: Secondary | ICD-10-CM

## 2018-01-03 DIAGNOSIS — C8339 Diffuse large B-cell lymphoma, extranodal and solid organ sites: Secondary | ICD-10-CM | POA: Diagnosis present

## 2018-01-03 DIAGNOSIS — D649 Anemia, unspecified: Secondary | ICD-10-CM | POA: Diagnosis present

## 2018-01-03 DIAGNOSIS — E119 Type 2 diabetes mellitus without complications: Secondary | ICD-10-CM

## 2018-01-03 DIAGNOSIS — Z7984 Long term (current) use of oral hypoglycemic drugs: Secondary | ICD-10-CM

## 2018-01-03 DIAGNOSIS — Z7951 Long term (current) use of inhaled steroids: Secondary | ICD-10-CM | POA: Diagnosis not present

## 2018-01-03 DIAGNOSIS — G720 Drug-induced myopathy: Secondary | ICD-10-CM | POA: Diagnosis present

## 2018-01-03 DIAGNOSIS — I10 Essential (primary) hypertension: Secondary | ICD-10-CM | POA: Diagnosis present

## 2018-01-03 DIAGNOSIS — Z87891 Personal history of nicotine dependence: Secondary | ICD-10-CM | POA: Diagnosis not present

## 2018-01-03 DIAGNOSIS — E1165 Type 2 diabetes mellitus with hyperglycemia: Secondary | ICD-10-CM | POA: Diagnosis present

## 2018-01-03 DIAGNOSIS — J449 Chronic obstructive pulmonary disease, unspecified: Secondary | ICD-10-CM | POA: Diagnosis present

## 2018-01-03 DIAGNOSIS — J9611 Chronic respiratory failure with hypoxia: Secondary | ICD-10-CM | POA: Diagnosis present

## 2018-01-03 DIAGNOSIS — E876 Hypokalemia: Secondary | ICD-10-CM | POA: Diagnosis not present

## 2018-01-03 DIAGNOSIS — M109 Gout, unspecified: Secondary | ICD-10-CM | POA: Diagnosis present

## 2018-01-03 DIAGNOSIS — Z809 Family history of malignant neoplasm, unspecified: Secondary | ICD-10-CM | POA: Diagnosis not present

## 2018-01-03 DIAGNOSIS — G936 Cerebral edema: Secondary | ICD-10-CM | POA: Diagnosis present

## 2018-01-03 DIAGNOSIS — T380X5A Adverse effect of glucocorticoids and synthetic analogues, initial encounter: Secondary | ICD-10-CM | POA: Diagnosis present

## 2018-01-03 DIAGNOSIS — Z5112 Encounter for antineoplastic immunotherapy: Secondary | ICD-10-CM | POA: Diagnosis not present

## 2018-01-03 DIAGNOSIS — M1990 Unspecified osteoarthritis, unspecified site: Secondary | ICD-10-CM | POA: Diagnosis present

## 2018-01-03 DIAGNOSIS — R945 Abnormal results of liver function studies: Secondary | ICD-10-CM | POA: Diagnosis not present

## 2018-01-03 LAB — GLUCOSE, CAPILLARY
Glucose-Capillary: 272 mg/dL — ABNORMAL HIGH (ref 70–99)
Glucose-Capillary: 235 mg/dL — ABNORMAL HIGH (ref 70–99)

## 2018-01-03 LAB — CBC WITH DIFFERENTIAL (CANCER CENTER ONLY)
BASOS PCT: 0 %
Basophils Absolute: 0 10*3/uL (ref 0.0–0.1)
Eosinophils Absolute: 0 10*3/uL (ref 0.0–0.5)
Eosinophils Relative: 0 %
HEMATOCRIT: 39 % (ref 38.4–49.9)
Hemoglobin: 13.3 g/dL (ref 13.0–17.1)
Lymphocytes Relative: 12 %
Lymphs Abs: 1.2 10*3/uL (ref 0.9–3.3)
MCH: 31.8 pg (ref 27.2–33.4)
MCHC: 34.1 g/dL (ref 32.0–36.0)
MCV: 93.4 fL (ref 79.3–98.0)
MONO ABS: 0.9 10*3/uL (ref 0.1–0.9)
MONOS PCT: 9 %
NEUTROS ABS: 7.9 10*3/uL — AB (ref 1.5–6.5)
Neutrophils Relative %: 79 %
Platelet Count: 154 10*3/uL (ref 140–400)
RBC: 4.18 MIL/uL — ABNORMAL LOW (ref 4.20–5.82)
RDW: 13.5 % (ref 11.0–14.6)
WBC Count: 10 10*3/uL (ref 4.0–10.3)

## 2018-01-03 LAB — URINALYSIS, COMPLETE (UACMP) WITH MICROSCOPIC
Bacteria, UA: NONE SEEN
Bilirubin Urine: NEGATIVE
Hgb urine dipstick: NEGATIVE
KETONES UR: NEGATIVE mg/dL
LEUKOCYTES UA: NEGATIVE
NITRITE: NEGATIVE
PROTEIN: NEGATIVE mg/dL
Specific Gravity, Urine: 1.023 (ref 1.005–1.030)
pH: 5 (ref 5.0–8.0)

## 2018-01-03 LAB — CMP (CANCER CENTER ONLY)
ALBUMIN: 3.4 g/dL — AB (ref 3.5–5.0)
ALT: 44 U/L (ref 0–44)
ANION GAP: 10 (ref 5–15)
AST: 23 U/L (ref 15–41)
Alkaline Phosphatase: 78 U/L (ref 38–126)
BILIRUBIN TOTAL: 0.4 mg/dL (ref 0.3–1.2)
BUN: 17 mg/dL (ref 8–23)
CO2: 31 mmol/L (ref 22–32)
Calcium: 9.1 mg/dL (ref 8.9–10.3)
Chloride: 95 mmol/L — ABNORMAL LOW (ref 98–111)
Creatinine: 0.95 mg/dL (ref 0.61–1.24)
GLUCOSE: 344 mg/dL — AB (ref 70–99)
POTASSIUM: 4.5 mmol/L (ref 3.5–5.1)
Sodium: 136 mmol/L (ref 135–145)
TOTAL PROTEIN: 6.2 g/dL — AB (ref 6.5–8.1)

## 2018-01-03 MED ORDER — PANTOPRAZOLE SODIUM 40 MG PO TBEC: 40.0000 mg | DELAYED_RELEASE_TABLET | Freq: Every day | ORAL | Status: DC

## 2018-01-03 MED ORDER — ADULT MULTIVITAMIN W/MINERALS CH
1.0000 | ORAL_TABLET | Freq: Every day | ORAL | Status: DC
  Administered 2018-01-04 – 2018-01-07 (×4): 1 via ORAL
  Filled 2018-01-03 (×4): qty 1

## 2018-01-03 MED ORDER — ONDANSETRON HCL 4 MG/2ML IJ SOLN: 4.0000 mg | Freq: Four times a day (QID) | INTRAMUSCULAR | Status: DC | PRN

## 2018-01-03 MED ORDER — ALLOPURINOL 300 MG PO TABS
300.0000 mg | ORAL_TABLET | Freq: Every day | ORAL | Status: DC
  Administered 2018-01-04 – 2018-01-07 (×4): 300 mg via ORAL
  Filled 2018-01-03 (×4): qty 1

## 2018-01-03 MED ORDER — ENOXAPARIN SODIUM 40 MG/0.4ML ~~LOC~~ SOLN
40.0000 mg | SUBCUTANEOUS | Status: DC
  Administered 2018-01-03 – 2018-01-06 (×4): 40 mg via SUBCUTANEOUS
  Filled 2018-01-03 (×4): qty 0.4

## 2018-01-03 MED ORDER — DEXAMETHASONE 4 MG PO TABS
2.0000 mg | ORAL_TABLET | Freq: Every day | ORAL | Status: DC
  Administered 2018-01-04 – 2018-01-07 (×4): 2 mg via ORAL
  Filled 2018-01-03 (×4): qty 1

## 2018-01-03 MED ORDER — DEXAMETHASONE 4 MG PO TABS: 4.0000 mg | ORAL_TABLET | Freq: Every day | ORAL | Status: DC

## 2018-01-03 MED ORDER — ONDANSETRON HCL 4 MG PO TABS: 4.0000 mg | ORAL_TABLET | Freq: Four times a day (QID) | ORAL | Status: DC | PRN

## 2018-01-03 MED ORDER — SODIUM BICARBONATE 650 MG PO TABS
1300.0000 mg | ORAL_TABLET | Freq: Three times a day (TID) | ORAL | Status: DC
  Administered 2018-01-03 – 2018-01-05 (×6): 1300 mg via ORAL
  Filled 2018-01-03 (×6): qty 2

## 2018-01-03 MED ORDER — SODIUM CHLORIDE 0.9 % IV SOLN
INTRAVENOUS | Status: DC
  Administered 2018-01-03: 18:00:00 via INTRAVENOUS

## 2018-01-03 MED ORDER — SODIUM CHLORIDE 0.9 % IV SOLN: 250.0000 mL | INTRAVENOUS | Status: DC | PRN

## 2018-01-03 MED ORDER — ACETAMINOPHEN 650 MG RE SUPP: 650.0000 mg | Freq: Four times a day (QID) | RECTAL | Status: DC | PRN

## 2018-01-03 MED ORDER — SODIUM CHLORIDE 0.9% FLUSH: 3.0000 mL | INTRAVENOUS | Status: DC | PRN

## 2018-01-03 MED ORDER — ATENOLOL 50 MG PO TABS
50.0000 mg | ORAL_TABLET | Freq: Every day | ORAL | Status: DC
  Administered 2018-01-04 – 2018-01-07 (×4): 50 mg via ORAL
  Filled 2018-01-03 (×4): qty 1

## 2018-01-03 MED ORDER — INSULIN ASPART 100 UNIT/ML ~~LOC~~ SOLN
0.0000 [IU] | Freq: Three times a day (TID) | SUBCUTANEOUS | Status: DC
  Administered 2018-01-03: 5 [IU] via SUBCUTANEOUS
  Administered 2018-01-04: 2 [IU] via SUBCUTANEOUS
  Administered 2018-01-04: 9 [IU] via SUBCUTANEOUS
  Administered 2018-01-04 – 2018-01-05 (×2): 2 [IU] via SUBCUTANEOUS

## 2018-01-03 MED ORDER — LEVETIRACETAM 500 MG PO TABS
500.0000 mg | ORAL_TABLET | Freq: Two times a day (BID) | ORAL | Status: DC
  Administered 2018-01-03 – 2018-01-07 (×8): 500 mg via ORAL
  Filled 2018-01-03 (×8): qty 1

## 2018-01-03 MED ORDER — STERILE WATER FOR INJECTION IV SOLN
INTRAVENOUS | Status: DC
  Administered 2018-01-03 – 2018-01-05 (×6): via INTRAVENOUS
  Filled 2018-01-03 (×7): qty 9.71

## 2018-01-03 MED ORDER — SODIUM CHLORIDE 0.9% FLUSH
3.0000 mL | Freq: Two times a day (BID) | INTRAVENOUS | Status: DC
  Administered 2018-01-03: 3 mL via INTRAVENOUS

## 2018-01-03 MED ORDER — INSULIN ASPART 100 UNIT/ML ~~LOC~~ SOLN
0.0000 [IU] | Freq: Every day | SUBCUTANEOUS | Status: DC
  Administered 2018-01-03 – 2018-01-04 (×2): 2 [IU] via SUBCUTANEOUS

## 2018-01-03 MED ORDER — FLUTICASONE FUROATE-VILANTEROL 200-25 MCG/INH IN AEPB
1.0000 | INHALATION_SPRAY | Freq: Every day | RESPIRATORY_TRACT | Status: DC
  Administered 2018-01-04 – 2018-01-07 (×4): 1 via RESPIRATORY_TRACT
  Filled 2018-01-03: qty 28

## 2018-01-03 MED ORDER — ACETAMINOPHEN 325 MG PO TABS: 650.0000 mg | ORAL_TABLET | Freq: Four times a day (QID) | ORAL | Status: DC | PRN

## 2018-01-03 NOTE — H&P (Signed)
History and Physical    [name]  XLK:[id number]  DOB: [birth year]  DOA: 01/03/2018 PCP: Leonard Downing, MD   Patient coming from: home  Chief Complaint: sent from oncology office to receive chemo  HPI: Tim [name] is a 62 y.o. male with medical history of primary CNS lymphoma, DM2, Gout, HTN who is being admitted at the request of Dr Mickeal Skinner to start the second round of Methotrexate and Rituximab in the hospital. The patient has no specific complaints. He mentions his upper legs are getting weaker and he has been told it is due to steroids. He has noted high sugars in the 300s. He takes Metformin regularly but could not afford the Insulin he was recommended to take.   ED Course: direct admit  Review of Systems:  All other systems reviewed and apart from HPI, are negative.  Past Medical History:  Diagnosis Date  . Arthritis   . Brain tumor ([place])   . COPD (chronic obstructive pulmonary disease) ([place])   . Diabetes mellitus without complication [facility])    patient states he was taken off meds at last appointment with PCP and is diet controlled  . Dyspnea   . Gout   . Hypertension 08/02/2017  . Pneumonia     Past Surgical History:  Procedure Laterality Date  . APPLICATION OF CRANIAL NAVIGATION Left 08/30/2017   Procedure: APPLICATION OF CRANIAL NAVIGATION;  Surgeon: Erline Levine, MD;  Location: [employer];  Service: Neurosurgery;  Laterality: Left;  . IR IMAGING GUIDED PORT INSERTION  12/27/2017  . STERIOTACTIC STIMULATOR INSERTION Left 08/30/2017   Procedure: LEFT Frontal Sterotactic Brain Biopsy with BrainLab;  Surgeon: Erline Levine, MD;  Location: [place];  Service: Neurosurgery;  Laterality: Left;    Social History:   reports that he quit smoking about 6 years ago. His smoking use included cigarettes. He has a 80.00 pack-year smoking history. He has never used smokeless tobacco. He reports that he drinks about 14.0 standard drinks of alcohol per week. He reports  that he does not use drugs.  No Known Allergies  Family History  Problem Relation Age of Onset  . Cancer Brother      Prior to Admission medications   Medication Sig Start Date End Date Taking? Authorizing Provider  acetaminophen (TYLENOL) 500 MG tablet Take 1,000 mg by mouth every 8 (eight) hours as needed (pain, fever, headache).     [provider]  albuterol (PROVENTIL HFA;VENTOLIN HFA) 108 (90 Base) MCG/ACT inhaler Inhale 2 puffs into the lungs every 4 (four) hours as needed for wheezing or shortness of breath. Patient not taking: Reported on 01/03/2018 12/13/17   Raiford Noble Latif, DO  allopurinol (ZYLOPRIM) 300 MG tablet Take 300 mg by mouth daily.    [provider]  atenolol (TENORMIN) 100 MG tablet Take 50 mg by mouth daily.    [provider]  b complex vitamins tablet Take 1 tablet by mouth daily.    [provider]  blood glucose meter kit and supplies KIT Dispense based on patient and insurance preference. Use up to four times daily as directed. (FOR ICD-9 250.00, 250.01). 12/13/17   Raiford Noble Latif, DO  budesonide-formoterol [facility]) 160-4.5 MCG/ACT inhaler Take 2 puffs first thing in am and then another 2 puffs about 12 hours later. 09/24/17   Tanda Rockers, MD  dexamethasone (DECADRON) 4 MG tablet Take 1 tablet (4 mg total) by mouth daily. 12/13/17 01/12/18  Raiford Noble Latif, DO  Insulin Glargine (LANTUS  SOLOSTAR) 100 UNIT/ML Solostar Pen Inject 18 Units into the skin daily. Patient not taking: Reported on 01/03/2018 12/13/17   Raiford Noble Latif, DO  Insulin Pen Needle (PEN NEEDLES) 31G X 5 MM MISC 1 Container by Does not apply route daily. Patient not taking: Reported on 01/03/2018 12/13/17   Raiford Noble Latif, DO  levETIRAcetam (KEPPRA) 500 MG tablet Take 1 tablet (500 mg total) by mouth 2 (two) times daily. 11/18/17   Ventura Sellers, MD  metFORMIN (GLUCOPHAGE) 500 MG tablet Take 1 tablet (500 mg total) by mouth 2 (two) times daily  with a meal. 12/14/17   Vaslow, [name], MD  Multiple Vitamin (MULTIVITAMIN) tablet Take 1 tablet by mouth daily.    [provider]  pantoprazole (PROTONIX) 40 MG tablet Take 1 tablet by mouth daily. Take while on Decadron 11/10/17 11/10/18  [provider]  sodium bicarbonate 650 MG tablet Take 2 tablets (1,300 mg total) by mouth 3 (three) times daily for 4 days. 12/30/17 01/03/18  Ventura Sellers, MD  valsartan (DIOVAN) 160 MG tablet Take 1 tablet (160 mg total) by mouth daily. 08/24/17   Tanda Rockers, MD    Physical Exam: Wt Readings from Last 3 Encounters:  01/03/18 111.2 kg  12/08/17 111.9 kg  12/07/17 111.9 kg   There were no vitals filed for this visit.    Constitutional:  Calm & comfortable Eyes: PERRLA, lids and conjunctivae normal ENT:  Mucous membranes are moist.  Pharynx clear of exudate   Normal dentition.  Neck: Supple, no masses  Respiratory:  Clear to auscultation bilaterally  Normal respiratory effort.  Cardiovascular:  S1 & S2 heard, regular rate and rhythm No Murmurs Abdomen:  Non distended No tenderness, No masses Bowel sounds normal Extremities:  No clubbing / cyanosis No pedal edema No joint deformity    Skin:  No rashes, lesions or ulcers Neurologic:  AAO x 3 CN 2-12 grossly intact Sensation intact Strength 5/5 in all 4 extremities Psychiatric:  Normal Mood and affect    Labs on Admission: I have personally reviewed following labs and imaging studies  CBC: Recent Labs  Lab 01/03/18 1237  WBC 10.0  NEUTROABS 7.9*  HGB 13.3  HCT 39.0  MCV 93.4  PLT 124   Basic Metabolic Panel: Recent Labs  Lab 01/03/18 1237  NA 136  K 4.5  CL 95*  CO2 31  GLUCOSE 344*  BUN 17  CREATININE 0.95  CALCIUM 9.1   GFR: Estimated Creatinine Clearance: 110 mL/min (by C-G formula based on SCr of 0.95 mg/dL). Liver Function Tests: Recent Labs  Lab 01/03/18 1237  AST 23  ALT 44  ALKPHOS 78  BILITOT 0.4  PROT 6.2*    ALBUMIN 3.4*   No results for input(s): LIPASE, AMYLASE in the last 168 hours. No results for input(s): AMMONIA in the last 168 hours. Coagulation Profile: No results for input(s): INR, PROTIME in the last 168 hours. Cardiac Enzymes: No results for input(s): CKTOTAL, CKMB, CKMBINDEX, TROPONINI in the last 168 hours. BNP (last 3 results) Recent Labs    09/15/17 1730  PROBNP 49.0   HbA1C: No results for input(s): HGBA1C in the last 72 hours. CBG: No results for input(s): GLUCAP in the last 168 hours. Lipid Profile: No results for input(s): CHOL, HDL, LDLCALC, TRIG, CHOLHDL, LDLDIRECT in the last 72 hours. Thyroid Function Tests: No results for input(s): TSH, T4TOTAL, FREET4, T3FREE, THYROIDAB in the last 72 hours. Anemia Panel: No results for input(s): VITAMINB12, FOLATE,  FERRITIN, TIBC, IRON, RETICCTPCT in the last 72 hours. Urine analysis:    Component Value Date/Time   COLORURINE STRAW (A) 12/13/2017 0433   APPEARANCEUR CLEAR 12/13/2017 0433   LABSPEC 1.010 12/13/2017 0433   PHURINE 9.0 (H) 12/13/2017 0433   GLUCOSEU NEGATIVE 12/13/2017 0433   HGBUR NEGATIVE 12/13/2017 0433   BILIRUBINUR NEGATIVE 12/13/2017 0433   KETONESUR NEGATIVE 12/13/2017 0433   PROTEINUR NEGATIVE 12/13/2017 0433   UROBILINOGEN 0.2 03/28/2007 1459   NITRITE NEGATIVE 12/13/2017 0433   LEUKOCYTESUR NEGATIVE 12/13/2017 0433   Sepsis Labs: _0 (procalcitonin:4,lacticidven:4) )No results found for this or any previous visit (from the past 240 hour(s)).   Radiological Exams on Admission: No results found.     Assessment/Plan Principal Problem:   Primary CNS lymphoma  - management per oncology -will resume Decadron 2 mg daily as he takes at home - cont Keppra for seizure prophylaxis - cont sodium Bicarb tabs- per Dr Mickeal Skinner, his Urinary pH needs to be high before treatment can be given - is it only 5.0 at this time  Active Problems:   Essential hypertension - Diovan, Atenolol    COPD   GOLD III  - cont Symbicort, Albuterol nebs    DM (diabetes mellitus), type 2- sugars worse in setting of steroid use - last A1c 9.8 in 7/19- Decadron is being weaned off  -  he states he cannot afford Lantus  - recheck A1c- I am not sure if his insurance will cover the newer oral medications - start Glipizide 2.5 mg BID- place on SSI while in the hospital - hold Metformin in the hospital   Gout - Allopurinil   DVT prophylaxis: Lovenox Code Status: Full code  Family Communication: wife  Disposition Plan:   Consults called: Dr Mickeal Skinner  Admission status: inpatient     Debbe Odea MD [facility] Pager: [email or web address] Password [email or web address] 7PM-7AM, please contact night-coverage   01/03/2018, 2:33 PM

## 2018-01-03 NOTE — Progress Notes (Signed)
[facility] [place], [place] [address] [phone]   Pre-Admission Evaluation  Date of Service: 01/03/18 Patient Name: Tim [name] Patient MRN: [id number] Patient DOB: [birth year] Provider: Ventura Sellers, MD  Identifying Statement:  Tim [name] is a 62 y.o. male with left frontal CNS lymphoma  Interval History:  Tim [name] presents today for admission and initiation of cycle#2 of high dose methotrexate and rituximab for CNS lymphoma.  He does describe some worsening of generalized weakness, described as difficulty walking up stairs and getting up from seated position (toilet or bed).  Otherwise no seizures, headaches, new neurologic deficits.  No issues from recent port placement.  Medications: Current Outpatient Medications on File Prior to Visit  Medication Sig Dispense Refill  . acetaminophen (TYLENOL) 500 MG tablet Take 1,000 mg by mouth every 8 (eight) hours as needed (pain, fever, headache).     Marland Kitchen albuterol (PROVENTIL HFA;VENTOLIN HFA) 108 (90 Base) MCG/ACT inhaler Inhale 2 puffs into the lungs every 4 (four) hours as needed for wheezing or shortness of breath. 1 Inhaler 0  . allopurinol (ZYLOPRIM) 300 MG tablet Take 300 mg by mouth daily.    Marland Kitchen atenolol (TENORMIN) 100 MG tablet Take 50 mg by mouth daily.    Marland Kitchen b complex vitamins tablet Take 1 tablet by mouth daily.    . blood glucose meter kit and supplies KIT Dispense based on patient and insurance preference. Use up to four times daily as directed. (FOR ICD-9 250.00, 250.01). 1 each 0  . budesonide-formoterol (SYMBICORT) 160-4.5 MCG/ACT inhaler Take 2 puffs first thing in am and then another 2 puffs about 12 hours later. 1 Inhaler 12  . dexamethasone (DECADRON) 4 MG tablet Take 1 tablet (4 mg total) by mouth daily. 30 tablet 0  . Insulin Glargine (LANTUS SOLOSTAR) 100 UNIT/ML Solostar Pen Inject 18 Units into the skin daily. 15 mL 11  . Insulin  Pen Needle (PEN NEEDLES) 31G X 5 MM MISC 1 Container by Does not apply route daily. 100 each 0  . levETIRAcetam (KEPPRA) 500 MG tablet Take 1 tablet (500 mg total) by mouth 2 (two) times daily. 60 tablet 5  . metFORMIN (GLUCOPHAGE) 500 MG tablet Take 1 tablet (500 mg total) by mouth 2 (two) times daily with a meal. 60 tablet 3  . Multiple Vitamin (MULTIVITAMIN) tablet Take 1 tablet by mouth daily.    . pantoprazole (PROTONIX) 40 MG tablet Take 1 tablet by mouth daily. Take while on Decadron    . sodium bicarbonate 650 MG tablet Take 2 tablets (1,300 mg total) by mouth 3 (three) times daily for 4 days. 24 tablet 0  . valsartan (DIOVAN) 160 MG tablet Take 1 tablet (160 mg total) by mouth daily. 30 tablet 11   No current facility-administered medications on file prior to visit.     Allergies: No Known Allergies Past Medical History:  Past Medical History:  Diagnosis Date  . Arthritis   . Brain tumor ([place])   . COPD (chronic obstructive pulmonary disease) ([place])   . Diabetes mellitus without complication [facility])    patient states he was taken off meds at last appointment with PCP and is diet controlled  . Dyspnea   . Gout   . Hypertension 08/02/2017  . Pneumonia    Past Surgical History: none    Social History:  Social History   Socioeconomic History  . Marital status: Significant Other    Spouse  name: [name]  . Number of children: Not on file  . Years of education: 26  . Highest education level: Bachelor's degree (e.g., BA, AB, BS)  Occupational History  . 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 Financial resource strain: Not hard at all  . Food insecurity:    Worry: Never true    Inability: Never true  . Transportation needs:    Medical: No    Non-medical: No  Tobacco Use  . Smoking status: Former Smoker    Packs/day: 2.00    Years: 40.00    Pack years: 80.00    Types: Cigarettes    Last attempt to quit: 08/10/2011    Years since  quitting: 6.4  . Smokeless tobacco: Never Used  Substance and Sexual Activity  . Alcohol use: Yes    Alcohol/week: 14.0 standard drinks    Types: 14 Cans of beer per week    Frequency: Never  . Drug use: Never  . Sexual activity: Not Currently  Lifestyle  . Physical activity:    Days per week: 0 days    Minutes per session: 0 min  . Stress: Only a little  Relationships  . Social connections:    Talks on phone: Once a week    Gets together: Once a week    Attends religious service: Never    Active member of club or organization: No    Attends meetings of clubs or organizations: Never    Relationship status: Living with partner  . Intimate partner violence:    Fear of current or ex partner: No    Emotionally abused: No    Physically abused: No    Forced sexual activity: No  Other Topics Concern  . Not on file  Social History Narrative  . Not on file   Family History:  Family History  Problem Relation Age of Onset  . Cancer Brother     Review of Systems: Constitutional: Denies fevers, chills or abnormal weight loss Eyes: Denies blurriness of vision Ears, nose, mouth, throat, and face: Denies mucositis or sore throat Respiratory: chronic hoarseness Cardiovascular: Denies palpitation, chest discomfort or lower extremity swelling Gastrointestinal:  Denies nausea, constipation, diarrhea GU: Denies dysuria or incontinence Skin: Denies abnormal skin rashes Neurological: Per HPI Musculoskeletal: Denies joint pain, back or neck discomfort. No decrease in ROM Behavioral/Psych: Denies anxiety, disturbance in thought content, and mood instability  Physical Exam: Vitals:   01/03/18 1308  BP: 126/69  Pulse: 69  Resp: 18  Temp: 98.2 F (36.8 C)  SpO2: 96%   KPS: 80. General: Alert, cooperative, pleasant, in no acute distress Head: Biopsy scar noted, dry and intact. EENT: No conjunctival injection or scleral icterus. Oral mucosa moist Lungs: Resp effort normal Cardiac:  Regular rate and rhythm Abdomen: Soft, non-distended abdomen Skin: No rashes cyanosis or petechiae. Extremities: No clubbing or edema  Neurologic Exam: Mental Status: Awake, alert, attentive to examiner. Oriented to self and environment. Language fluent with intact comprehension, repetition, reading. Cranial Nerves: Visual acuity is grossly normal. Visual fields are full. Extra-ocular movements intact. No ptosis. Face is symmetric, tongue midline. Motor: Tone and bulk are normal. Power 4+/5 in right arm and leg, subtle hip girdle weakness. Reflexes are symmetric, no pathologic reflexes present. Intact finger to nose bilaterally Sensory: Intact to light touch and temperature Gait: Normal, independent   Labs: I have reviewed the data as listed    Component Value Date/Time  NA 140 12/13/2017 0503   K 4.0 12/13/2017 0503   CL 97 (L) 12/13/2017 0503   CO2 35 (H) 12/13/2017 0503   GLUCOSE 136 (H) 12/13/2017 0503   BUN 17 12/13/2017 0503   CREATININE 0.63 12/13/2017 0503   CREATININE 1.19 12/07/2017 0856   CALCIUM 8.1 (L) 12/13/2017 0503   PROT 5.0 (L) 12/13/2017 0503   ALBUMIN 2.7 (L) 12/13/2017 0503   AST 56 (H) 12/13/2017 0503   AST 23 12/07/2017 0856   ALT 97 (H) 12/13/2017 0503   ALT 49 (H) 12/07/2017 0856   ALKPHOS 47 12/13/2017 0503   BILITOT 0.6 12/13/2017 0503   BILITOT 0.3 12/07/2017 0856   GFRNONAA >60 12/13/2017 0503   GFRNONAA >60 12/07/2017 0856   GFRAA >60 12/13/2017 0503   GFRAA >60 12/07/2017 0856   Lab Results  Component Value Date   WBC 5.6 12/27/2017   NEUTROABS 4.0 12/13/2017   HGB 14.5 12/27/2017   HCT 41.1 12/27/2017   MCV 91.1 12/27/2017   PLT 229 12/27/2017     Imaging:  CLINICAL DATA:  Initial treatment strategy for CNS lymphoma.  EXAM: NUCLEAR MEDICINE PET SKULL BASE TO THIGH  TECHNIQUE: 13.5 mCi F-18 FDG was injected intravenously. Full-ring PET imaging was performed from the skull base to thigh after the radiotracer. CT data was  obtained and used for attenuation correction and anatomic localization.  Fasting blood glucose: 141 mg/dl  COMPARISON:  CT scans 08/04/2017 and MRI brain 08/16/2017.  FINDINGS: Mediastinal blood pool activity: SUV max 2.85  NECK: No hypermetabolic lymph nodes in the neck.  Incidental CT findings: none  CHEST: Numerous ill-defined hazy pulmonary lesions are hypermetabolic. Findings most consistent with some type of inflammatory or infectious lung disease or possibly drug reaction. There also hypermetabolic hilar and mediastinal nodes. These are not enlarged however and again I think these are probably reactive. This does not look like lymphoma in the lungs and the chest CT from 08/04/2017 was relatively normal.  Incidental CT findings: none  ABDOMEN/PELVIS: No abnormal hypermetabolic activity within the liver, pancreas, adrenal glands, or spleen. No hypermetabolic lymph nodes in the abdomen or pelvis.  Incidental CT findings: The liver and spleen are normal in size.  SKELETON: No focal hypermetabolic activity to suggest skeletal metastasis.  Incidental CT findings: none  IMPRESSION: 1. Numerous pulmonary lesions which are hypermetabolic along with hypermetabolic small mediastinal and hilar nodes. Findings most likely due to an inflammatory or infectious pulmonary process or possible drug reaction. Pulmonary lymphoma is highly unlikely. Recommend correlation with clinical findings and appropriate treatment. A short-term follow-up chest CT may be helpful (2-3 months). 2. No findings for lymphoma Nissen vomit of the abdomen/pelvis or skeletal structures. No axillary or inguinal adenopathy.   Electronically Signed   By: Marijo Sanes M.D.   On: 09/10/2017 17:09  [place] Clinician Interpretation: I have personally reviewed the MRI brain with and without contrast from outside institution dated 11/09/17.  My interpretation, in the context of the patient's clinical  presentation, is progressive disease  Pathology     Assessment/Plan  Primary CNS lymphoma [facility])   Mr. Dunnaway is clinically stable today and ready for initiation of cycle #2 HD-MTX and rituximab.    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   -appropriate to proceed with C2 of high dose MTX with leucovorin rescue -MTX-R therapy C2D1today: Methotrexate 3.5g/m2 given over 4 hours once  urine pH at or above 7.0 -Leucovorin 58m q6 hours 24 hours after MTX infusion   -Rituxan can be given 24-48 hours after MTX infusion -To start ASAP: IV fluids, 1/4NS with 1036m sodium bicarbonate at 12552mr.  Will adjust rate until urine pH at or above 7.0, con't to titrate to 8.0. -daily labs with cbc, cmp -4x daily urine pH once it reaches target pH of 7 -daily MTX levels until MTX levels <0.10 -continue same supportive medications  -ok to continue decadron 2mg63mily.  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 -please to do not administer proton pump inhibitors during MTX admissions  2) Focal seizures -Con't 500mg33m Keppra  3) Reported sleep apnea/hypopnea -Uses non-pressurized oxygen 3L nocturnal.  Doesn't require O2 even with exertion during the day.   4) Diabetes -Insulin sliding scale -Never obtained standing insulin for home use.  Please consult diabetes coordinator and social work for access if standing insulin required for discharge.  We appreciate the opportunity to participate in the care of ChrisALARIC GLADWINill continue to follow daily during this admission.  Direct contact # is [phone] total time spent in the encounter was 40 minutes and more than 50% was on counseling and review of test results   ZachaVentura SellersMedical Director of Neuro-Oncology [facility] HospitalW[facility]le[facility] Spring6/19 12:46 PM

## 2018-01-03 NOTE — Progress Notes (Signed)
Inpatient Diabetes Program Recommendations  AACE/ADA: New Consensus Statement on Inpatient Glycemic Control (2015)  Target Ranges:  Prepandial:   less than 140 mg/dL      Peak postprandial:   less than 180 mg/dL (1-2 hours)      Critically ill patients:  140 - 180 mg/dL   Lab Results  Component Value Date   GLUCAP 272 (H) 01/03/2018   HGBA1C 9.8 (H) 12/08/2017    Review of Glycemic Control  Diabetes history: DM2 Outpatient Diabetes medications: metformin 500 mg bid Current orders for Inpatient glycemic control: Novolog 0-9 units tidwc and hs  HgbA1C - 9.8%. Familiar with pt from previous hospitalizaiton. Pt received prescription for Lantus, but stated he would try diet and exercise first. Pt did not want to go home on insulin. Reports Lantus cost was $500 for pen. Will need to go home on affordable insulin such as Novolin 70/30 pen, which is $44 at [facility]. Will review insulin pen administration with pt and wife.  Inpatient Diabetes Program Recommendations:     Add 70/30 12 units bid Increase Novolog to 0-15 units tidwc and hs  Will see pt in am regarding insulin.  Thank you. Lorenda Peck, RD, LDN, CDE Inpatient Diabetes Coordinator [phone]

## 2018-01-04 ENCOUNTER — Telehealth: Payer: Self-pay

## 2018-01-04 DIAGNOSIS — G4730 Sleep apnea, unspecified: Secondary | ICD-10-CM

## 2018-01-04 DIAGNOSIS — Z5112 Encounter for antineoplastic immunotherapy: Secondary | ICD-10-CM

## 2018-01-04 DIAGNOSIS — Z794 Long term (current) use of insulin: Secondary | ICD-10-CM

## 2018-01-04 DIAGNOSIS — E119 Type 2 diabetes mellitus without complications: Secondary | ICD-10-CM

## 2018-01-04 DIAGNOSIS — Z87891 Personal history of nicotine dependence: Secondary | ICD-10-CM

## 2018-01-04 DIAGNOSIS — C8589 Other specified types of non-Hodgkin lymphoma, extranodal and solid organ sites: Secondary | ICD-10-CM

## 2018-01-04 LAB — URINALYSIS, DIPSTICK ONLY
Bilirubin Urine: NEGATIVE
Bilirubin Urine: NEGATIVE
Bilirubin Urine: NEGATIVE
Bilirubin Urine: NEGATIVE
GLUCOSE, UA: 100 mg/dL — AB
GLUCOSE, UA: 50 mg/dL — AB
HGB URINE DIPSTICK: NEGATIVE
HGB URINE DIPSTICK: NEGATIVE
HGB URINE DIPSTICK: NEGATIVE
Hgb urine dipstick: NEGATIVE
KETONES UR: NEGATIVE mg/dL
Ketones, ur: NEGATIVE mg/dL
Ketones, ur: NEGATIVE mg/dL
Ketones, ur: NEGATIVE mg/dL
LEUKOCYTES UA: NEGATIVE
Leukocytes, UA: NEGATIVE
Leukocytes, UA: NEGATIVE
Nitrite: NEGATIVE
Nitrite: NEGATIVE
Nitrite: NEGATIVE
Nitrite: NEGATIVE
PH: 5 (ref 5.0–8.0)
PH: 6 (ref 5.0–8.0)
PH: 6 (ref 5.0–8.0)
PH: 9 — AB (ref 5.0–8.0)
Protein, ur: NEGATIVE mg/dL
Protein, ur: NEGATIVE mg/dL
Protein, ur: NEGATIVE mg/dL
Protein, ur: NEGATIVE mg/dL
SPECIFIC GRAVITY, URINE: 1.008 (ref 1.005–1.030)
Specific Gravity, Urine: 1.015 (ref 1.005–1.030)
Specific Gravity, Urine: <1.005 — ABNORMAL LOW (ref 1.005–1.030)
Specific Gravity, Urine: 1.012 (ref 1.005–1.030)

## 2018-01-04 LAB — BASIC METABOLIC PANEL
ANION GAP: 10 (ref 5–15)
BUN: 14 mg/dL (ref 8–23)
CO2: 34 mmol/L — AB (ref 22–32)
Calcium: 8.3 mg/dL — ABNORMAL LOW (ref 8.9–10.3)
Chloride: 96 mmol/L — ABNORMAL LOW (ref 98–111)
Creatinine, Ser: 0.56 mg/dL — ABNORMAL LOW (ref 0.61–1.24)
GFR calc Af Amer: >60 mL/min (ref >60)
GLUCOSE: 188 mg/dL — AB (ref 70–99)
Potassium: 3.5 mmol/L (ref 3.5–5.1)
Sodium: 140 mmol/L (ref 135–145)

## 2018-01-04 LAB — GLUCOSE, CAPILLARY
GLUCOSE-CAPILLARY: 354 mg/dL — AB (ref 70–99)
GLUCOSE-CAPILLARY: 226 mg/dL — AB (ref 70–99)
Glucose-Capillary: 172 mg/dL — ABNORMAL HIGH (ref 70–99)
Glucose-Capillary: 172 mg/dL — ABNORMAL HIGH (ref 70–99)

## 2018-01-04 MED ORDER — ACETAZOLAMIDE 250 MG PO TABS
500.0000 mg | ORAL_TABLET | Freq: Three times a day (TID) | ORAL | Status: DC
  Administered 2018-01-04 – 2018-01-05 (×3): 500 mg via ORAL
  Filled 2018-01-04 (×4): qty 2

## 2018-01-04 NOTE — Progress Notes (Signed)
[facility] [place], [place] [address] [phone]   Inpatient Progress Note  Date of Service: 01/03/18 Patient Name: Tim [name] Patient MRN: [id number] Patient DOB: 08-22-1955 Provider: Ventura Sellers, MD  Identifying Statement:  Tim [name] is a 62 y.o. male with left frontal CNS lymphoma  Interval History:  Tim [name] has no complaints today.  Unfortunately urine pH is still 6.0 from 11am sample.    Medications: No current facility-administered medications on file prior to encounter.    Current Outpatient Medications on File Prior to Encounter  Medication Sig Dispense Refill  . acetaminophen (TYLENOL) 500 MG tablet Take 1,000 mg by mouth every 8 (eight) hours as needed (pain, fever, headache).     Marland Kitchen allopurinol (ZYLOPRIM) 300 MG tablet Take 300 mg by mouth daily.    Marland Kitchen atenolol (TENORMIN) 100 MG tablet Take 50 mg by mouth daily.    Marland Kitchen b complex vitamins tablet Take 1 tablet by mouth daily.    . budesonide-formoterol (SYMBICORT) 160-4.5 MCG/ACT inhaler Take 2 puffs first thing in am and then another 2 puffs about 12 hours later. 1 Inhaler 12  . dexamethasone (DECADRON) 4 MG tablet Take 1 tablet (4 mg total) by mouth daily. 30 tablet 0  . levETIRAcetam (KEPPRA) 500 MG tablet Take 1 tablet (500 mg total) by mouth 2 (two) times daily. 60 tablet 5  . metFORMIN (GLUCOPHAGE) 500 MG tablet Take 1 tablet (500 mg total) by mouth 2 (two) times daily with a meal. 60 tablet 3  . Multiple Vitamin (MULTIVITAMIN) tablet Take 1 tablet by mouth daily.    . pantoprazole (PROTONIX) 40 MG tablet Take 1 tablet by mouth daily. Take while on Decadron    . valsartan (DIOVAN) 160 MG tablet Take 1 tablet (160 mg total) by mouth daily. 30 tablet 11  . albuterol (PROVENTIL HFA;VENTOLIN HFA) 108 (90 Base) MCG/ACT inhaler Inhale 2 puffs into the lungs every 4 (four) hours as needed for wheezing or shortness of breath. (Patient  not taking: Reported on 01/03/2018) 1 Inhaler 0  . blood glucose meter kit and supplies KIT Dispense based on patient and insurance preference. Use up to four times daily as directed. (FOR ICD-9 250.00, 250.01). 1 each 0  . Insulin Glargine (LANTUS SOLOSTAR) 100 UNIT/ML Solostar Pen Inject 18 Units into the skin daily. (Patient not taking: Reported on 01/03/2018) 15 mL 11  . Insulin Pen Needle (PEN NEEDLES) 31G X 5 MM MISC 1 Container by Does not apply route daily. (Patient not taking: Reported on 01/03/2018) 100 each 0    Allergies: No Known Allergies Past Medical History:  Past Medical History:  Diagnosis Date  . Arthritis   . Brain tumor ([place])   . COPD (chronic obstructive pulmonary disease) ([place])   . Diabetes mellitus without complication [facility])    patient states he was taken off meds at last appointment with PCP and is diet controlled  . Dyspnea   . Gout   . Hypertension 08/02/2017  . Pneumonia    Past Surgical History: none    Social History:  Social History   Socioeconomic History  . Marital status: Significant Other    Spouse name: [name]  . Number of children: Not on file  . Years of education: 7  . Highest education level: Bachelor's degree (e.g., BA, AB, BS)  Occupational History  . 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 Financial resource strain: Not hard at all  . Food insecurity:    Worry: Never true    Inability: Never true  . Transportation needs:    Medical: No    Non-medical: No  Tobacco Use  . Smoking status: Former Smoker    Packs/day: 2.00    Years: 40.00    Pack years: 80.00    Types: Cigarettes    Last attempt to quit: 08/10/2011    Years since quitting: 6.4  . Smokeless tobacco: Never Used  Substance and Sexual Activity  . Alcohol use: Yes    Alcohol/week: 14.0 standard drinks    Types: 14 Cans of beer per week    Frequency: Never  . Drug use: Never  . Sexual activity: Not Currently  Lifestyle  .  Physical activity:    Days per week: 0 days    Minutes per session: 0 min  . Stress: Only a little  Relationships  . Social connections:    Talks on phone: Once a week    Gets together: Once a week    Attends religious service: Never    Active member of club or organization: No    Attends meetings of clubs or organizations: Never    Relationship status: Living with partner  . Intimate partner violence:    Fear of current or ex partner: No    Emotionally abused: No    Physically abused: No    Forced sexual activity: No  Other Topics Concern  . Not on file  Social History Narrative  . Not on file   Family History:  Family History  Problem Relation Age of Onset  . Cancer Brother     Review of Systems: Constitutional: Denies fevers, chills or abnormal weight loss Eyes: Denies blurriness of vision Ears, nose, mouth, throat, and face: Denies mucositis or sore throat Respiratory: chronic hoarseness Cardiovascular: Denies palpitation, chest discomfort or lower extremity swelling Gastrointestinal:  Denies nausea, constipation, diarrhea GU: Denies dysuria or incontinence Skin: Denies abnormal skin rashes Neurological: Per HPI Musculoskeletal: Denies joint pain, back or neck discomfort. No decrease in ROM Behavioral/Psych: Denies anxiety, disturbance in thought content, and mood instability  Physical Exam: Vitals:   01/04/18 0611 01/04/18 0743  BP: 127/73   Pulse: (!) 52   Resp: 20   Temp: 97.8 F (36.6 C)   SpO2: 96% 95%   KPS: 80. General: Alert, cooperative, pleasant, in no acute distress Head: Biopsy scar noted, dry and intact. EENT: No conjunctival injection or scleral icterus. Oral mucosa moist Lungs: Resp effort normal Cardiac: Regular rate and rhythm Abdomen: Soft, non-distended abdomen Skin: No rashes cyanosis or petechiae. Extremities: No clubbing or edema  Neurologic Exam: Mental Status: Awake, alert, attentive to examiner. Oriented to self and  environment. Language fluent with intact comprehension, repetition, reading. Cranial Nerves: Visual acuity is grossly normal. Visual fields are full. Extra-ocular movements intact. No ptosis. Face is symmetric, tongue midline. Motor: Tone and bulk are normal. Power 4+/5 in right arm and leg, subtle hip girdle weakness. Reflexes are symmetric, no pathologic reflexes present. Intact finger to nose bilaterally Sensory: Intact to light touch and temperature Gait: Normal, independent   Labs: I have reviewed the data as listed    Component Value Date/Time   NA 140 01/04/2018 0610   K 3.5 01/04/2018 0610   CL 96 (L) 01/04/2018 0610   CO2 34 (H) 01/04/2018 0610   GLUCOSE 188 (H) 01/04/2018 0610   BUN 14 01/04/2018 0610  CREATININE 0.56 (L) 01/04/2018 0610   CREATININE 0.95 01/03/2018 1237   CALCIUM 8.3 (L) 01/04/2018 0610   PROT 6.2 (L) 01/03/2018 1237   ALBUMIN 3.4 (L) 01/03/2018 1237   AST 23 01/03/2018 1237   ALT 44 01/03/2018 1237   ALKPHOS 78 01/03/2018 1237   BILITOT 0.4 01/03/2018 1237   GFRNONAA >60 01/04/2018 0610   GFRNONAA >60 01/03/2018 1237   GFRAA >60 01/04/2018 0610   GFRAA >60 01/03/2018 1237   Lab Results  Component Value Date   WBC 10.0 01/03/2018   NEUTROABS 7.9 (H) 01/03/2018   HGB 13.3 01/03/2018   HCT 39.0 01/03/2018   MCV 93.4 01/03/2018   PLT 154 01/03/2018     Imaging:  CLINICAL DATA:  Initial treatment strategy for CNS lymphoma.  EXAM: NUCLEAR MEDICINE PET SKULL BASE TO THIGH  TECHNIQUE: 13.5 mCi F-18 FDG was injected intravenously. Full-ring PET imaging was performed from the skull base to thigh after the radiotracer. CT data was obtained and used for attenuation correction and anatomic localization.  Fasting blood glucose: 141 mg/dl  COMPARISON:  CT scans 08/04/2017 and MRI brain 08/16/2017.  FINDINGS: Mediastinal blood pool activity: SUV max 2.85  NECK: No hypermetabolic lymph nodes in the neck.  Incidental CT findings:  none  CHEST: Numerous ill-defined hazy pulmonary lesions are hypermetabolic. Findings most consistent with some type of inflammatory or infectious lung disease or possibly drug reaction. There also hypermetabolic hilar and mediastinal nodes. These are not enlarged however and again I think these are probably reactive. This does not look like lymphoma in the lungs and the chest CT from 08/04/2017 was relatively normal.  Incidental CT findings: none  ABDOMEN/PELVIS: No abnormal hypermetabolic activity within the liver, pancreas, adrenal glands, or spleen. No hypermetabolic lymph nodes in the abdomen or pelvis.  Incidental CT findings: The liver and spleen are normal in size.  SKELETON: No focal hypermetabolic activity to suggest skeletal metastasis.  Incidental CT findings: none  IMPRESSION: 1. Numerous pulmonary lesions which are hypermetabolic along with hypermetabolic small mediastinal and hilar nodes. Findings most likely due to an inflammatory or infectious pulmonary process or possible drug reaction. Pulmonary lymphoma is highly unlikely. Recommend correlation with clinical findings and appropriate treatment. A short-term follow-up chest CT may be helpful (2-3 months). 2. No findings for lymphoma Nissen vomit of the abdomen/pelvis or skeletal structures. No axillary or inguinal adenopathy.   Electronically Signed   By: Marijo Sanes M.D.   On: 09/10/2017 17:09  [facility] Clinician Interpretation: I have personally reviewed the MRI brain with and without contrast from outside institution dated 11/09/17.  My interpretation, in the context of the patient's clinical presentation, is progressive disease  Pathology     Assessment/Plan  Primary CNS lymphoma ([place]) - Plan: 0.9 %  sodium chloride infusion, sodium chloride 0.225 % with sodium bicarbonate 100 mEq infusion, acetaZOLAMIDE (DIAMOX) tablet 500 mg   01/04/18 updated recommendations:  Will add acetazolamide  569m TID to encourage urine alkalinization.  Con't IV fluids at current rate.  Will plan for MTX infusion likely tomorrow  Mr. SMashekis clinically stable today and ready for initiation of cycle #2 HD-MTX and rituximab.    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   -appropriate to proceed with C2 of high dose MTX with leucovorin rescue -MTX-R therapy C2D1today: Methotrexate 3.5g/m2 given over 4 hours once urine pH at or above 7.0 -  Leucovorin 91m q6 hours 24 hours after MTX infusion   -Rituxan can be given 24-48 hours after MTX infusion -To start ASAP: IV fluids, 1/4NS with 1061m sodium bicarbonate at 12584mr.  Will adjust rate until urine pH at or above 7.0, con't to titrate to 8.0. -daily labs with cbc, cmp -4x daily urine pH once it reaches target pH of 7 -daily MTX levels until MTX levels <0.10 -continue same supportive medications  -ok to continue decadron 2mg2mily.  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 -please to do not administer proton pump inhibitors during MTX admissions  2) Focal seizures -Con't 500mg74m Keppra  3) Reported sleep apnea/hypopnea -Uses non-pressurized oxygen 3L nocturnal.  Doesn't require O2 even with exertion during the day.   4) Diabetes -Insulin sliding scale -Never obtained standing insulin for home use.  Please consult diabetes coordinator and social work for access if standing insulin required for discharge.  We appreciate the opportunity to participate in the care of ChrisJAMEIRE KOUBAill continue to follow daily during this admission.  Direct contact # is [phone] ZachaVentura SellersMedical Director of Neuro-Oncology [facility] Cent[facility]le[facility]ase City6/19 3:32 PM

## 2018-01-04 NOTE — Telephone Encounter (Signed)
Per 8/26 no los 

## 2018-01-04 NOTE — Progress Notes (Signed)
PROGRESS NOTE [facility]   Tim [name]   SPQ:[id number] DOB: [birth year]  DOA: 01/03/2018 PCP: Leonard Downing, MD   Brief Narrative:  Tim [name] is a 62 year old male with medical history of primary CNS lymphoma, diabetes type 2, gout, hypertension who was admitted at the request of oncology for second round of MTX and Rituximab in hospital.   Subjective: Patient has no complaints this morning, awaiting for urine to become alkaline in order to begin therapy.  Glucose improved.  Assessment & Plan: Primary CNS lymphoma Management per oncology On Decadron 2 mg daily which is been tapering down.  Likely contributing to hyperglycemia Continue Keppra for seizure prophylaxis On sodium bicarb tabs as urine noted to be alkalinized before starting treatment.  Diabetes mellitus type 2 uncontrolled with hyperglycemia Likely due to steroid use, last A1c 9.8 in 7/19, Decadron is being weaned off.  Previously he was discharged on Lantus however could not afford and was taking only metformin.  A1c, CBGs stable during hospital stay, pending on A1c, patient could be managed with oral hypoglycemic agents.  Continue SSI while inpatient.  Essential hypertension BP stable, continue home medication  COPD Gold 3 Stable, continue home inhalers  Gout Stable, continue allopurinol  DVT prophylaxis: Lovenox Code Status: Full Code  Family Communication: Wife  Disposition Plan: Pending Oncology clearance    Consultants:   Onc   Procedures:   None   Antimicrobials:  None     Objective: Vitals:   01/04/18 0400 01/04/18 0611 01/04/18 0743 01/04/18 1628  BP:  127/73  121/85  Pulse:  (!) 52  (!) 57  Resp:  20  18  Temp:  97.8 F (36.6 C)  98.1 F (36.7 C)  TempSrc:  Oral  Oral  SpO2:  96% 95% 93%  Height: 6\' 3"  (1.905 m)       Intake/Output Summary (Last 24 hours) at 01/04/2018 1641 Last data filed at 01/04/2018 1542 Gross per 24 hour  Intake 3593.64 ml    Output 750 ml  Net 2843.64 ml   There were no vitals filed for this visit.  Examination:  General: Pt is alert, awake, not in acute distress Cardiovascular: RRR, S1/S2 +, no rubs, no gallops Respiratory: CTA bilaterally, no wheezing, no rhonchi Abdominal: Soft, NT, ND, bowel sounds + Neuro: CN grossly intact, AAOx3  Extremities: no edema, no cyanosis   Data Reviewed: I have personally reviewed following labs and imaging studies  CBC: Recent Labs  Lab 01/03/18 1237  WBC 10.0  NEUTROABS 7.9*  HGB 13.3  HCT 39.0  MCV 93.4  PLT 333   Basic Metabolic Panel: Recent Labs  Lab 01/03/18 1237 01/04/18 0610  NA 136 140  K 4.5 3.5  CL 95* 96*  CO2 31 34*  GLUCOSE 344* 188*  BUN 17 14  CREATININE 0.95 0.56*  CALCIUM 9.1 8.3*   GFR: Estimated Creatinine Clearance: 130.6 mL/min (A) (by C-G formula based on SCr of 0.56 mg/dL (L)). Liver Function Tests: Recent Labs  Lab 01/03/18 1237  AST 23  ALT 44  ALKPHOS 78  BILITOT 0.4  PROT 6.2*  ALBUMIN 3.4*   No results for input(s): LIPASE, AMYLASE in the last 168 hours. No results for input(s): AMMONIA in the last 168 hours. Coagulation Profile: No results for input(s): INR, PROTIME in the last 168 hours. Cardiac Enzymes: No results for input(s): CKTOTAL, CKMB, CKMBINDEX, TROPONINI in the last 168 hours. BNP (last 3 results) Recent Labs    09/15/17 1730  PROBNP 49.0   HbA1C: No results for input(s): HGBA1C in the last 72 hours. CBG: Recent Labs  Lab 01/03/18 1638 01/03/18 2143 01/04/18 0729 01/04/18 1144  GLUCAP 272* 235* 172* 172*   Lipid Profile: No results for input(s): CHOL, HDL, LDLCALC, TRIG, CHOLHDL, LDLDIRECT in the last 72 hours. Thyroid Function Tests: No results for input(s): TSH, T4TOTAL, FREET4, T3FREE, THYROIDAB in the last 72 hours. Anemia Panel: No results for input(s): VITAMINB12, FOLATE, FERRITIN, TIBC, IRON, RETICCTPCT in the last 72 hours. Sepsis Labs: No results for input(s):  PROCALCITON, LATICACIDVEN in the last 168 hours.  No results found for this or any previous visit (from the past 240 hour(s)).    Radiology Studies: No results found.    Scheduled Meds: . acetaZOLAMIDE  500 mg Oral TID  . allopurinol  300 mg Oral Daily  . atenolol  50 mg Oral Daily  . dexamethasone  2 mg Oral Daily  . enoxaparin (LOVENOX) injection  40 mg Subcutaneous Q24H  . fluticasone furoate-vilanterol  1 puff Inhalation Daily  . insulin aspart  0-5 Units Subcutaneous QHS  . insulin aspart  0-9 Units Subcutaneous TID WC  . levETIRAcetam  500 mg Oral BID  . multivitamin with minerals  1 tablet Oral Daily  . sodium bicarbonate  1,300 mg Oral TID  . sodium chloride flush  3 mL Intravenous Q12H   Continuous Infusions: . sodium chloride    . sodium chloride 10 mL/hr at 01/04/18 1542  .  sodium bicarbonate infusion 1/4 NS 1000 mL 125 mL/hr at 01/04/18 1542     LOS: 1 day    Time spent: Total of 15 minutes spent with pt, greater than 50% of which was spent in discussion of  treatment, counseling and coordination of care    Chipper Oman, MD Pager: Text Page via [email or web address]   If 7PM-7AM, please contact night-coverage [email or web address] 01/04/2018, 4:41 PM   Note - This record has been created using [employer]. Chart creation errors have been sought, but may not always have been located. Such creation errors do not reflect on the standard of medical care.

## 2018-01-05 DIAGNOSIS — I10 Essential (primary) hypertension: Secondary | ICD-10-CM

## 2018-01-05 DIAGNOSIS — J449 Chronic obstructive pulmonary disease, unspecified: Secondary | ICD-10-CM

## 2018-01-05 DIAGNOSIS — J9611 Chronic respiratory failure with hypoxia: Secondary | ICD-10-CM

## 2018-01-05 DIAGNOSIS — E1165 Type 2 diabetes mellitus with hyperglycemia: Secondary | ICD-10-CM

## 2018-01-05 LAB — URINALYSIS, DIPSTICK ONLY
BILIRUBIN URINE: NEGATIVE
Bilirubin Urine: NEGATIVE
Glucose, UA: NEGATIVE mg/dL
Glucose, UA: >=500 mg/dL — AB
Hgb urine dipstick: NEGATIVE
Hgb urine dipstick: NEGATIVE
KETONES UR: NEGATIVE mg/dL
KETONES UR: NEGATIVE mg/dL
LEUKOCYTES UA: NEGATIVE
LEUKOCYTES UA: NEGATIVE
NITRITE: NEGATIVE
NITRITE: POSITIVE — AB
PROTEIN: 100 mg/dL — AB
Protein, ur: NEGATIVE mg/dL
Specific Gravity, Urine: 1.003 — ABNORMAL LOW (ref 1.005–1.030)
Specific Gravity, Urine: 1.018 (ref 1.005–1.030)
pH: 9 — ABNORMAL HIGH (ref 5.0–8.0)
pH: 8 (ref 5.0–8.0)

## 2018-01-05 LAB — BASIC METABOLIC PANEL
Anion gap: 8 (ref 5–15)
BUN: 15 mg/dL (ref 8–23)
CALCIUM: 8.5 mg/dL — AB (ref 8.9–10.3)
CO2: 32 mmol/L (ref 22–32)
Chloride: 103 mmol/L (ref 98–111)
Creatinine, Ser: 0.64 mg/dL (ref 0.61–1.24)
GFR calc Af Amer: >60 mL/min (ref >60)
GLUCOSE: 180 mg/dL — AB (ref 70–99)
Potassium: 3.1 mmol/L — ABNORMAL LOW (ref 3.5–5.1)
SODIUM: 143 mmol/L (ref 135–145)

## 2018-01-05 LAB — GLUCOSE, CAPILLARY
GLUCOSE-CAPILLARY: 171 mg/dL — AB (ref 70–99)
GLUCOSE-CAPILLARY: 108 mg/dL — AB (ref 70–99)
GLUCOSE-CAPILLARY: 324 mg/dL — AB (ref 70–99)
GLUCOSE-CAPILLARY: 213 mg/dL — AB (ref 70–99)

## 2018-01-05 LAB — CBC WITH DIFFERENTIAL/PLATELET
BASOS ABS: 0 10*3/uL (ref 0.0–0.1)
BASOS PCT: 0 %
EOS PCT: 0 %
Eosinophils Absolute: 0 10*3/uL (ref 0.0–0.7)
HCT: 35.8 % — ABNORMAL LOW (ref 39.0–52.0)
Hemoglobin: 12.1 g/dL — ABNORMAL LOW (ref 13.0–17.0)
Lymphocytes Relative: 22 %
Lymphs Abs: 1.5 10*3/uL (ref 0.7–4.0)
MCH: 32.1 pg (ref 26.0–34.0)
MCHC: 33.8 g/dL (ref 30.0–36.0)
MCV: 95 fL (ref 78.0–100.0)
MONO ABS: 0.7 10*3/uL (ref 0.1–1.0)
Monocytes Relative: 10 %
Neutro Abs: 4.6 10*3/uL (ref 1.7–7.7)
Neutrophils Relative %: 68 %
PLATELETS: 113 10*3/uL — AB (ref 150–400)
RBC: 3.77 MIL/uL — ABNORMAL LOW (ref 4.22–5.81)
RDW: 13.6 % (ref 11.5–15.5)
WBC: 6.8 10*3/uL (ref 4.0–10.5)

## 2018-01-05 LAB — HEMOGLOBIN A1C
HEMOGLOBIN A1C: 11.4 % — AB (ref 4.8–5.6)
MEAN PLASMA GLUCOSE: 280.48 mg/dL

## 2018-01-05 MED ORDER — ALBUTEROL SULFATE (2.5 MG/3ML) 0.083% IN NEBU: 2.5000 mg | INHALATION_SOLUTION | RESPIRATORY_TRACT | Status: DC | PRN

## 2018-01-05 MED ORDER — INSULIN ASPART 100 UNIT/ML ~~LOC~~ SOLN
0.0000 [IU] | Freq: Three times a day (TID) | SUBCUTANEOUS | Status: DC
  Administered 2018-01-05 – 2018-01-06 (×2): 11 [IU] via SUBCUTANEOUS
  Administered 2018-01-06 (×2): 3 [IU] via SUBCUTANEOUS
  Administered 2018-01-07: 2 [IU] via SUBCUTANEOUS
  Administered 2018-01-07: 5 [IU] via SUBCUTANEOUS

## 2018-01-05 MED ORDER — SODIUM CHLORIDE 0.9 % IV SOLN
3.5000 g/m2 | Freq: Once | INTRAVENOUS | Status: AC
  Administered 2018-01-05: 8.61 g via INTRAVENOUS
  Filled 2018-01-05: qty 344.4

## 2018-01-05 MED ORDER — POTASSIUM CHLORIDE CRYS ER 20 MEQ PO TBCR
40.0000 meq | EXTENDED_RELEASE_TABLET | Freq: Two times a day (BID) | ORAL | Status: AC
  Administered 2018-01-05 (×2): 40 meq via ORAL
  Filled 2018-01-05 (×2): qty 2

## 2018-01-05 MED ORDER — INSULIN ASPART 100 UNIT/ML ~~LOC~~ SOLN
0.0000 [IU] | Freq: Every day | SUBCUTANEOUS | Status: DC
  Administered 2018-01-05: 2 [IU] via SUBCUTANEOUS
  Administered 2018-01-06: 3 [IU] via SUBCUTANEOUS

## 2018-01-05 MED ORDER — INSULIN ASPART PROT & ASPART (70-30 MIX) 100 UNIT/ML ~~LOC~~ SUSP
12.0000 [IU] | Freq: Two times a day (BID) | SUBCUTANEOUS | Status: DC
  Administered 2018-01-05 – 2018-01-07 (×3): 12 [IU] via SUBCUTANEOUS
  Filled 2018-01-05: qty 10

## 2018-01-05 MED ORDER — STERILE WATER FOR INJECTION IV SOLN
INTRAVENOUS | Status: DC
  Administered 2018-01-05 – 2018-01-06 (×3): via INTRAVENOUS
  Filled 2018-01-05 (×4): qty 9.71

## 2018-01-05 MED ORDER — SODIUM CHLORIDE 0.9 % IV SOLN
Freq: Once | INTRAVENOUS | Status: AC
  Administered 2018-01-05: 36 mg via INTRAVENOUS
  Filled 2018-01-05: qty 8

## 2018-01-05 NOTE — Progress Notes (Signed)
Recommended to inpatient pharmacist to consider monitoring daily weights and/or urine output while undergoing high dose methotrexate therapy.   Demetrius Charity, PharmD, [facility] Pharmacist Pharmacy Phone: [phone] 01/05/2018

## 2018-01-05 NOTE — Progress Notes (Signed)
Chemo dosages and dilutions verified by 2 chemo RNs 

## 2018-01-05 NOTE — Care Management (Signed)
CM consult for Insulin affordability. This CM spoke with pt and wife at bedside. Pt explains that he does not have prescription coverage along with his Medicare A and B. This CM expressed the importance of him having prescription coverage going forward. Pt also given information on how to apply for Medicaid. Pt given information on GoodRx in the meantime. Per Diabetes coordinator, insulin Pen for Novolin 70/30 is $44 at [facility] and pt expresses that he is able to afford that. If pt needs Novolog for meal coverage the cost of that is much higher ($126 at [facility]). Hopefully pt will only need 70/30 insulin and he understands the importance of controlling his Diabetes with diet as to not need more medication/insulin to control it. Marney Doctor RN,BSN [phone]

## 2018-01-05 NOTE — Progress Notes (Signed)
PROGRESS NOTE    Tim [name]  KGY:[id number] DOB: 14-Mar-1956 DOA: 01/03/2018 PCP: Leonard Downing, MD  Brief Narrative:  Tim [name] is a 62 year old male with medical history of primary CNS lymphoma, diabetes type 2, gout, hypertension and other comorbids who was admitted at the request of oncology for second round of MTX and Rituximab in hospital. He is scheduled to receive MTX today as Urine pH has improved.   Assessment & Plan:   Principal Problem:   Primary CNS lymphoma ([place]) Active Problems:   Essential hypertension   COPD  GOLD III    Chronic respiratory failure with hypoxia (HCC)   DM (diabetes mellitus), type 2 (HCC)   CNS lymphoma (HCC)  Primary CNS Diffuse Large B-Cell Lymphoma  -On Steroids with Dexamethasone 2 mg po Daily for cytotoxic edema by Neuro-Oncology  -On Levitriacetam 500 mg po BID as outpt - will continue while hospitalized  -Chemotherapy with HD-Methotrexate with Leucovorin Resuce and Rituximab started by Oncology once Urine pH >7 and continue to Titrate to 8.0; Chemotherapy initiated by Neuro-Oncology 12/09/17 and this is Cycle 2 and repeat MTX to be given today  -Oncology checking MTX Levels daily  -Check Urine Dipstick per Oncology 4x daily until Urine pH reaches Target pH of 7.0 -C/w Daily CBC and CMP; Dr. Mickeal Skinner checking Daily MTX Levels until they are <0.10 -IVF per Neuro-Oncology and patient was started on 1/4 NS with 100 mEQ of Sodium Bicarbonate at 125 mL/hr -Acetazolamide 500 mg TID added to help Alkalinize urine   Essential Hypertension -C/w Atenolol 50 mg po Daily  -Valsartan 160 mg po Daily currently being held   COPD GOLD III/ Hx of OSA -Currently not in Exacerbation -C/w Breo Ellipta 200-25 mcg/INH 1 puff Daily  -C/w Albuterol 2.5 mg IH q4hprn Wheezing and SOB -Continue to Monitor Respiratory Status  -C/w Non-Pressurized 3 Liters Nocturnally  Uncontrolled DM (Diabetes Mellitus), type 2  -Increased Sensitive  Novolog SSI to Moderate Novolog SSI AC/HS  -C/w Diabetic Diet and Strict Adherence -Checked A1c this visit and was 11.4  -Unable to afford Lantus that he was discharged on last visit so will start Novolin 70/30 12 units BID -Consult Diabetes Education Coordinator  -Continue to Monitor CBG's closely; CBG's imrpoving ranging from 108-354  Mild Thrombocytopenia -Platelet Count is 113 -Continue to Monitor for S/Sx of Bleeding -Continue to Monitor and Repeat CBC in AM  Hx of Gout -C/w Allopurinol 300 mg po Daily   LE Edema -Improving -C/w Compression stockings -Check Albumin Level in AM   Hypokalemia -Patient's potassium this morning was 3.1 -Replete with p.o. potassium chloride 40 mg twice daily x2 doses -Continue to monitor and replete as necessary -Repeat CMP in the a.m.  Normocytic Anemia -Patient's Hb/Hct is now 12.1/35.8 -Continue to Monitor for S/Sx of Bleeding -Repeat CBC in AM   DVT prophylaxis: Enoxaparin 40 mg sq q24h Code Status: FULL CODE Family Communication: No family present at bedside Disposition Plan: Pending Improvement and Clearance by Neuro-Oncology   Consultants:   Neuro-Oncology Dr. Mickeal Skinner  Procedures: None   Antimicrobials:  Anti-infectives (From admission, onward)   None     Subjective: Seen and examined at bedside and had no complaints but states he is feeling weak.  No chest pain, shortness breath, nausea, vomiting.  Feels lower extremity swelling has improved. Ready to receive his second dose of MTX today.  No other concerns or quads at this time  Objective: Vitals:   01/05/18 0263 01/05/18 0434 01/05/18 0757 01/05/18  0759  BP: 129/67 133/70    Pulse: (!) 51 (!) 50    Resp: 16 16    Temp: 98 F (36.7 C) 97.7 F (36.5 C)    TempSrc: Oral Oral    SpO2:  92% 93% 93%  Weight: 111.2 kg     Height: 6\' 3"  (1.905 m)       Intake/Output Summary (Last 24 hours) at 01/05/2018 0848 Last data filed at 01/05/2018 0319 Gross per 24 hour   Intake 3523.75 ml  Output -  Net 3523.75 ml   Filed Weights   01/05/18 0058  Weight: 111.2 kg   Examination: Physical Exam:  Constitutional: WN/WD obese Caucasian male in NAD and appears calm and comfortable Eyes: Lids and conjunctivae normal, sclerae anicteric  ENMT: External Ears, Nose appear normal. Grossly normal hearing. Mucous membranes are moist.  Neck: Appears normal, supple, no cervical masses, normal ROM, no appreciable thyromegaly, no JVD Respiratory: Diminished to auscultation bilaterally, no wheezing, rales, rhonchi or crackles. Normal respiratory effort and patient is not tachypenic. No accessory muscle use.  Cardiovascular: RRR, no murmurs / rubs / gallops. S1 and S2 auscultated. Trace extremity edema. Abdomen: Soft, non-tender, slightly distended 2/2 body habitus. No masses palpated. No appreciable hepatosplenomegaly. Bowel sounds positive.  GU: Deferred. Musculoskeletal: No clubbing / cyanosis of digits/nails. No joint deformity upper and lower extremities. Good ROM, no contractures.  Skin: No rashes, lesions, ulcers on a limited skin evaluation. No induration; Warm and dry.  Neurologic: CN 2-12 grossly intact with no focal deficits. Romberg sign and cerebellar reflexes not assessed.  Psychiatric: Normal judgment and insight. Alert and oriented x 3. Normal mood and appropriate affect.   Data Reviewed: I have personally reviewed following labs and imaging studies  CBC: Recent Labs  Lab 01/03/18 1237 01/05/18 0530  WBC 10.0 6.8  NEUTROABS 7.9* 4.6  HGB 13.3 12.1*  HCT 39.0 35.8*  MCV 93.4 95.0  PLT 154 329*   Basic Metabolic Panel: Recent Labs  Lab 01/03/18 1237 01/04/18 0610 01/05/18 0530  NA 136 140 143  K 4.5 3.5 3.1*  CL 95* 96* 103  CO2 31 34* 32  GLUCOSE 344* 188* 180*  BUN 17 14 15   CREATININE 0.95 0.56* 0.64  CALCIUM 9.1 8.3* 8.5*   GFR: Estimated Creatinine Clearance: 130.6 mL/min (by C-G formula based on SCr of 0.64 mg/dL). Liver  Function Tests: Recent Labs  Lab 01/03/18 1237  AST 23  ALT 44  ALKPHOS 78  BILITOT 0.4  PROT 6.2*  ALBUMIN 3.4*   No results for input(s): LIPASE, AMYLASE in the last 168 hours. No results for input(s): AMMONIA in the last 168 hours. Coagulation Profile: No results for input(s): INR, PROTIME in the last 168 hours. Cardiac Enzymes: No results for input(s): CKTOTAL, CKMB, CKMBINDEX, TROPONINI in the last 168 hours. BNP (last 3 results) Recent Labs    09/15/17 1730  PROBNP 49.0   HbA1C: Recent Labs    01/05/18 0530  HGBA1C 11.4*   CBG: Recent Labs  Lab 01/04/18 0729 01/04/18 1144 01/04/18 1706 01/04/18 2043 01/05/18 0727  GLUCAP 172* 172* 354* 226* 171*   Lipid Profile: No results for input(s): CHOL, HDL, LDLCALC, TRIG, CHOLHDL, LDLDIRECT in the last 72 hours. Thyroid Function Tests: No results for input(s): TSH, T4TOTAL, FREET4, T3FREE, THYROIDAB in the last 72 hours. Anemia Panel: No results for input(s): VITAMINB12, FOLATE, FERRITIN, TIBC, IRON, RETICCTPCT in the last 72 hours. Sepsis Labs: No results for input(s): PROCALCITON, LATICACIDVEN in the last 168  hours.  No results found for this or any previous visit (from the past 240 hour(s)).   Radiology Studies: No results found.  Scheduled Meds: . acetaZOLAMIDE  500 mg Oral TID  . allopurinol  300 mg Oral Daily  . atenolol  50 mg Oral Daily  . dexamethasone  2 mg Oral Daily  . enoxaparin (LOVENOX) injection  40 mg Subcutaneous Q24H  . fluticasone furoate-vilanterol  1 puff Inhalation Daily  . insulin aspart  0-5 Units Subcutaneous QHS  . insulin aspart  0-9 Units Subcutaneous TID WC  . insulin aspart protamine- aspart  12 Units Subcutaneous BID WC  . levETIRAcetam  500 mg Oral BID  . multivitamin with minerals  1 tablet Oral Daily  . potassium chloride  40 mEq Oral BID  . sodium bicarbonate  1,300 mg Oral TID  . sodium chloride flush  3 mL Intravenous Q12H   Continuous Infusions: . sodium chloride     . sodium chloride 10 mL/hr at 01/05/18 0319  .  sodium bicarbonate infusion 1/4 NS 1000 mL 125 mL/hr at 01/05/18 0319    LOS: 2 days   Kerney Elbe, DO [facility] PAGER is on AMION  If 7PM-7AM, please contact night-coverage [email or web address] Password [facility] 01/05/2018, 8:48 AM

## 2018-01-05 NOTE — Progress Notes (Signed)
[facility] [address], [place] [address] [phone]   Inpatient Progress Note  Date of Service: 01/03/18 Patient Name: Tim [name] Patient MRN: [id number] Patient DOB: 21-Jan-1956 Provider: Ventura Sellers, MD  Identifying Statement:  Tim [name] is a 62 y.o. male with left frontal CNS lymphoma  Interval History:  Tim [name] has no complaints today.  He is resting comfortably with supplemental oxygen, with methotrexate infusing.  No issues with acetazolamide.  Medications: No current facility-administered medications on file prior to encounter.    Current Outpatient Medications on File Prior to Encounter  Medication Sig Dispense Refill  . acetaminophen (TYLENOL) 500 MG tablet Take 1,000 mg by mouth every 8 (eight) hours as needed (pain, fever, headache).     Marland Kitchen allopurinol (ZYLOPRIM) 300 MG tablet Take 300 mg by mouth daily.    Marland Kitchen atenolol (TENORMIN) 100 MG tablet Take 50 mg by mouth daily.    Marland Kitchen b complex vitamins tablet Take 1 tablet by mouth daily.    . budesonide-formoterol (SYMBICORT) 160-4.5 MCG/ACT inhaler Take 2 puffs first thing in am and then another 2 puffs about 12 hours later. 1 Inhaler 12  . dexamethasone (DECADRON) 4 MG tablet Take 1 tablet (4 mg total) by mouth daily. 30 tablet 0  . levETIRAcetam (KEPPRA) 500 MG tablet Take 1 tablet (500 mg total) by mouth 2 (two) times daily. 60 tablet 5  . metFORMIN (GLUCOPHAGE) 500 MG tablet Take 1 tablet (500 mg total) by mouth 2 (two) times daily with a meal. 60 tablet 3  . Multiple Vitamin (MULTIVITAMIN) tablet Take 1 tablet by mouth daily.    . pantoprazole (PROTONIX) 40 MG tablet Take 1 tablet by mouth daily. Take while on Decadron    . valsartan (DIOVAN) 160 MG tablet Take 1 tablet (160 mg total) by mouth daily. 30 tablet 11  . albuterol (PROVENTIL HFA;VENTOLIN HFA) 108 (90 Base) MCG/ACT inhaler Inhale 2 puffs into the lungs every 4 (four) hours as  needed for wheezing or shortness of breath. (Patient not taking: Reported on 01/03/2018) 1 Inhaler 0  . blood glucose meter kit and supplies KIT Dispense based on patient and insurance preference. Use up to four times daily as directed. (FOR ICD-9 250.00, 250.01). 1 each 0  . Insulin Glargine (LANTUS SOLOSTAR) 100 UNIT/ML Solostar Pen Inject 18 Units into the skin daily. (Patient not taking: Reported on 01/03/2018) 15 mL 11  . Insulin Pen Needle (PEN NEEDLES) 31G X 5 MM MISC 1 Container by Does not apply route daily. (Patient not taking: Reported on 01/03/2018) 100 each 0    Allergies: No Known Allergies Past Medical History:  Past Medical History:  Diagnosis Date  . Arthritis   . Brain tumor ([place])   . COPD (chronic obstructive pulmonary disease) ([place])   . Diabetes mellitus without complication [facility])    patient states he was taken off meds at last appointment with PCP and is diet controlled  . Dyspnea   . Gout   . Hypertension 08/02/2017  . Pneumonia    Past Surgical History: none    Social History:  Social History   Socioeconomic History  . Marital status: Significant Other    Spouse name: [name]  . Number of children: Not on file  . Years of education: 53  . Highest education level: Bachelor's degree (e.g., BA, AB, BS)  Occupational History  . 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 Financial resource strain: Not hard at all  . Food insecurity:    Worry: Never true    Inability: Never true  . Transportation needs:    Medical: No    Non-medical: No  Tobacco Use  . Smoking status: Former Smoker    Packs/day: 2.00    Years: 40.00    Pack years: 80.00    Types: Cigarettes    Last attempt to quit: 08/10/2011    Years since quitting: 6.4  . Smokeless tobacco: Never Used  Substance and Sexual Activity  . Alcohol use: Yes    Alcohol/week: 14.0 standard drinks    Types: 14 Cans of beer per week    Frequency: Never  . Drug use: Never  .  Sexual activity: Not Currently  Lifestyle  . Physical activity:    Days per week: 0 days    Minutes per session: 0 min  . Stress: Only a little  Relationships  . Social connections:    Talks on phone: Once a week    Gets together: Once a week    Attends religious service: Never    Active member of club or organization: No    Attends meetings of clubs or organizations: Never    Relationship status: Living with partner  . Intimate partner violence:    Fear of current or ex partner: No    Emotionally abused: No    Physically abused: No    Forced sexual activity: No  Other Topics Concern  . Not on file  Social History Narrative  . Not on file   Family History:  Family History  Problem Relation Age of Onset  . Cancer Brother     Review of Systems: Constitutional: Denies fevers, chills or abnormal weight loss Eyes: Denies blurriness of vision Ears, nose, mouth, throat, and face: Denies mucositis or sore throat Respiratory: chronic hoarseness Cardiovascular: Denies palpitation, chest discomfort or lower extremity swelling Gastrointestinal:  Denies nausea, constipation, diarrhea GU: Denies dysuria or incontinence Skin: Denies abnormal skin rashes Neurological: Per HPI Musculoskeletal: Denies joint pain, back or neck discomfort. No decrease in ROM Behavioral/Psych: Denies anxiety, disturbance in thought content, and mood instability  Physical Exam: Vitals:   01/05/18 0759 01/05/18 1404  BP:  121/70  Pulse:  (!) 54  Resp:  18  Temp:  97.9 F (36.6 C)  SpO2: 93% 95%   KPS: 80. General: Alert, cooperative, pleasant, in no acute distress Head: Biopsy scar noted, dry and intact. EENT: No conjunctival injection or scleral icterus. Oral mucosa moist Lungs: Resp effort normal Cardiac: Regular rate and rhythm Abdomen: Soft, non-distended abdomen Skin: No rashes cyanosis or petechiae. Extremities: No clubbing or edema  Neurologic Exam: Mental Status: Awake, alert,  attentive to examiner. Oriented to self and environment. Language fluent with intact comprehension, repetition, reading. Cranial Nerves: Visual acuity is grossly normal. Visual fields are full. Extra-ocular movements intact. No ptosis. Face is symmetric, tongue midline. Motor: Tone and bulk are normal. Power 4+/5 in right arm and leg, subtle hip girdle weakness. Reflexes are symmetric, no pathologic reflexes present. Intact finger to nose bilaterally Sensory: Intact to light touch and temperature Gait: Normal, independent   Labs: I have reviewed the data as listed    Component Value Date/Time   NA 143 01/05/2018 0530   K 3.1 (L) 01/05/2018 0530   CL 103 01/05/2018 0530   CO2 32 01/05/2018 0530   GLUCOSE 180 (H) 01/05/2018 0530   BUN 15  01/05/2018 0530   CREATININE 0.64 01/05/2018 0530   CREATININE 0.95 01/03/2018 1237   CALCIUM 8.5 (L) 01/05/2018 0530   PROT 6.2 (L) 01/03/2018 1237   ALBUMIN 3.4 (L) 01/03/2018 1237   AST 23 01/03/2018 1237   ALT 44 01/03/2018 1237   ALKPHOS 78 01/03/2018 1237   BILITOT 0.4 01/03/2018 1237   GFRNONAA >60 01/05/2018 0530   GFRNONAA >60 01/03/2018 1237   GFRAA >60 01/05/2018 0530   GFRAA >60 01/03/2018 1237   Lab Results  Component Value Date   WBC 6.8 01/05/2018   NEUTROABS 4.6 01/05/2018   HGB 12.1 (L) 01/05/2018   HCT 35.8 (L) 01/05/2018   MCV 95.0 01/05/2018   PLT 113 (L) 01/05/2018     Imaging:  CLINICAL DATA:  Initial treatment strategy for CNS lymphoma.  EXAM: NUCLEAR MEDICINE PET SKULL BASE TO THIGH  TECHNIQUE: 13.5 mCi F-18 FDG was injected intravenously. Full-ring PET imaging was performed from the skull base to thigh after the radiotracer. CT data was obtained and used for attenuation correction and anatomic localization.  Fasting blood glucose: 141 mg/dl  COMPARISON:  CT scans 08/04/2017 and MRI brain 08/16/2017.  FINDINGS: Mediastinal blood pool activity: SUV max 2.85  NECK: No hypermetabolic lymph nodes in  the neck.  Incidental CT findings: none  CHEST: Numerous ill-defined hazy pulmonary lesions are hypermetabolic. Findings most consistent with some type of inflammatory or infectious lung disease or possibly drug reaction. There also hypermetabolic hilar and mediastinal nodes. These are not enlarged however and again I think these are probably reactive. This does not look like lymphoma in the lungs and the chest CT from 08/04/2017 was relatively normal.  Incidental CT findings: none  ABDOMEN/PELVIS: No abnormal hypermetabolic activity within the liver, pancreas, adrenal glands, or spleen. No hypermetabolic lymph nodes in the abdomen or pelvis.  Incidental CT findings: The liver and spleen are normal in size.  SKELETON: No focal hypermetabolic activity to suggest skeletal metastasis.  Incidental CT findings: none  IMPRESSION: 1. Numerous pulmonary lesions which are hypermetabolic along with hypermetabolic small mediastinal and hilar nodes. Findings most likely due to an inflammatory or infectious pulmonary process or possible drug reaction. Pulmonary lymphoma is highly unlikely. Recommend correlation with clinical findings and appropriate treatment. A short-term follow-up chest CT may be helpful (2-3 months). 2. No findings for lymphoma Nissen vomit of the abdomen/pelvis or skeletal structures. No axillary or inguinal adenopathy.   Electronically Signed   By: Marijo Sanes M.D.   On: 09/10/2017 17:09  [place] Clinician Interpretation: I have personally reviewed the MRI brain with and without contrast from outside institution dated 11/09/17.  My interpretation, in the context of the patient's clinical presentation, is progressive disease  Pathology     Assessment/Plan  Primary CNS lymphoma ([place]) - Plan: ondansetron (ZOFRAN) 16 mg, dexamethasone (DECADRON) 20 mg in sodium chloride 0.9 % 50 mL IVPB, methotrexate (PF) 8.61 g in sodium chloride 0.9 % 1,000 mL chemo  infusion, 0.9 %  sodium chloride infusion, sodium chloride 0.225 % with sodium bicarbonate 100 mEq infusion, PHYSICIAN COMMUNICATION ORDER, PHYSICIAN COMMUNICATION ORDER, DISCONTINUED: acetaZOLAMIDE (DIAMOX) tablet 500 mg   01/05/18 updated recommendations:  -Currently infusing MTX -DC'd Acetazolamide and oral Na Bicarb -Decreased fluid rate to 100/hr -Sending MTX level off tomorrow -Rituxan to be dosed tomorrow per pharmacy -Start leucovorin rescue q6 24h following MTX infusion  Mr. Hasten is clinically stable today and ready for initiation of cycle #2 HD-MTX and rituximab.    1) Primary CNS Lymphoma:  tissue confirmation by brain biopsy of presence of aggressive diffuse large B-cell lymphoma with no evidence of leptomeningeal or ocular spread.   -appropriate to proceed with C2 of high dose MTX with leucovorin rescue -MTX-R therapy C2D1today: Methotrexate 3.5g/m2 given over 4 hours once urine pH at or above 7.0 -Leucovorin 38m q6 hours 24 hours after MTX infusion   -Rituxan can be given 24-48 hours after MTX infusion -To start ASAP: IV fluids, 1/4NS with 1085m sodium bicarbonate at 12585mr.  Will adjust rate until urine pH at or above 7.0, con't to titrate to 8.0. -daily labs with cbc, cmp -4x daily urine pH once it reaches target pH of 7 -daily MTX levels until MTX levels <0.10 -continue same supportive medications  -ok to continue decadron 2mg97mily.  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 -please to do not administer proton pump inhibitors during MTX admissions  2) Focal seizures -Con't 500mg72m Keppra  3) Reported sleep apnea/hypopnea -Uses non-pressurized oxygen 3L nocturnal.  Doesn't require O2 even with exertion during the day.   4) Diabetes -Insulin sliding scale -Never obtained standing insulin for home use.  Please consult diabetes coordinator and social work for access if standing insulin required for  discharge.  We appreciate the opportunity to participate in the care of ChrisBRAND SIEVERill continue to follow daily during this admission.  Direct contact # is [phone] ZachaVentura SellersMedical Director of Neuro-Oncology [facility]6/19 3:29 PM

## 2018-01-06 DIAGNOSIS — R945 Abnormal results of liver function studies: Secondary | ICD-10-CM

## 2018-01-06 LAB — URINALYSIS, DIPSTICK ONLY
BILIRUBIN URINE: NEGATIVE
BILIRUBIN URINE: NEGATIVE
BILIRUBIN URINE: NEGATIVE
Bilirubin Urine: NEGATIVE
GLUCOSE, UA: 50 mg/dL — AB
GLUCOSE, UA: NEGATIVE mg/dL
Glucose, UA: >=500 mg/dL — AB
Glucose, UA: >=500 mg/dL — AB
HGB URINE DIPSTICK: NEGATIVE
HGB URINE DIPSTICK: NEGATIVE
HGB URINE DIPSTICK: NEGATIVE
Hgb urine dipstick: NEGATIVE
KETONES UR: NEGATIVE mg/dL
Ketones, ur: NEGATIVE mg/dL
Ketones, ur: NEGATIVE mg/dL
Ketones, ur: NEGATIVE mg/dL
LEUKOCYTES UA: NEGATIVE
LEUKOCYTES UA: NEGATIVE
Leukocytes, UA: NEGATIVE
Leukocytes, UA: NEGATIVE
NITRITE: NEGATIVE
Nitrite: NEGATIVE
Nitrite: NEGATIVE
Nitrite: NEGATIVE
PH: 8 (ref 5.0–8.0)
PROTEIN: 100 mg/dL — AB
PROTEIN: NEGATIVE mg/dL
PROTEIN: NEGATIVE mg/dL
Protein, ur: NEGATIVE mg/dL
SPECIFIC GRAVITY, URINE: 1.017 (ref 1.005–1.030)
Specific Gravity, Urine: 1.011 (ref 1.005–1.030)
Specific Gravity, Urine: 1.014 (ref 1.005–1.030)
Specific Gravity, Urine: 1.025 (ref 1.005–1.030)
pH: 7 (ref 5.0–8.0)
pH: 8 (ref 5.0–8.0)
pH: 8 (ref 5.0–8.0)

## 2018-01-06 LAB — COMPREHENSIVE METABOLIC PANEL
ALK PHOS: 67 U/L (ref 38–126)
ALT: 64 U/L — AB (ref 0–44)
ANION GAP: 7 (ref 5–15)
AST: 48 U/L — ABNORMAL HIGH (ref 15–41)
Albumin: 3.2 g/dL — ABNORMAL LOW (ref 3.5–5.0)
BILIRUBIN TOTAL: 0.5 mg/dL (ref 0.3–1.2)
BUN: 18 mg/dL (ref 8–23)
CO2: 28 mmol/L (ref 22–32)
CREATININE: 0.62 mg/dL (ref 0.61–1.24)
Calcium: 8.6 mg/dL — ABNORMAL LOW (ref 8.9–10.3)
Chloride: 108 mmol/L (ref 98–111)
Glucose, Bld: 178 mg/dL — ABNORMAL HIGH (ref 70–99)
Potassium: 3.8 mmol/L (ref 3.5–5.1)
Sodium: 143 mmol/L (ref 135–145)
TOTAL PROTEIN: 5.8 g/dL — AB (ref 6.5–8.1)

## 2018-01-06 LAB — GLUCOSE, CAPILLARY
GLUCOSE-CAPILLARY: 166 mg/dL — AB (ref 70–99)
GLUCOSE-CAPILLARY: 285 mg/dL — AB (ref 70–99)
Glucose-Capillary: 171 mg/dL — ABNORMAL HIGH (ref 70–99)
Glucose-Capillary: 326 mg/dL — ABNORMAL HIGH (ref 70–99)

## 2018-01-06 LAB — METHOTREXATE: METHOTREXATE: 11.01

## 2018-01-06 LAB — CBC WITH DIFFERENTIAL/PLATELET
Basophils Absolute: 0 10*3/uL (ref 0.0–0.1)
Basophils Relative: 0 %
Eosinophils Absolute: 0 10*3/uL (ref 0.0–0.7)
Eosinophils Relative: 0 %
HEMATOCRIT: 37 % — AB (ref 39.0–52.0)
HEMOGLOBIN: 12.5 g/dL — AB (ref 13.0–17.0)
LYMPHS ABS: 0.6 10*3/uL — AB (ref 0.7–4.0)
LYMPHS PCT: 6 %
MCH: 31.8 pg (ref 26.0–34.0)
MCHC: 33.8 g/dL (ref 30.0–36.0)
MCV: 94.1 fL (ref 78.0–100.0)
MONOS PCT: 4 %
Monocytes Absolute: 0.4 10*3/uL (ref 0.1–1.0)
NEUTROS ABS: 8.9 10*3/uL — AB (ref 1.7–7.7)
NEUTROS PCT: 90 %
Platelets: 129 10*3/uL — ABNORMAL LOW (ref 150–400)
RBC: 3.93 MIL/uL — AB (ref 4.22–5.81)
RDW: 13.7 % (ref 11.5–15.5)
WBC: 9.9 10*3/uL (ref 4.0–10.5)

## 2018-01-06 LAB — PHOSPHORUS: PHOSPHORUS: 3.5 mg/dL (ref 2.5–4.6)

## 2018-01-06 LAB — MAGNESIUM: Magnesium: 2.3 mg/dL (ref 1.7–2.4)

## 2018-01-06 MED ORDER — ACETAMINOPHEN 325 MG PO TABS
650.0000 mg | ORAL_TABLET | Freq: Once | ORAL | Status: AC
  Administered 2018-01-06: 650 mg via ORAL
  Filled 2018-01-06: qty 2

## 2018-01-06 MED ORDER — HEPARIN SOD (PORK) LOCK FLUSH 100 UNIT/ML IV SOLN
500.0000 [IU] | Freq: Once | INTRAVENOUS | Status: DC | PRN
  Filled 2018-01-06: qty 5

## 2018-01-06 MED ORDER — LEUCOVORIN CALCIUM INJECTION 100 MG: 15.0000 mg | Freq: Four times a day (QID) | INTRAMUSCULAR | Status: DC

## 2018-01-06 MED ORDER — DIPHENHYDRAMINE HCL 50 MG/ML IJ SOLN: 50.0000 mg | Freq: Once | INTRAMUSCULAR | Status: DC | PRN

## 2018-01-06 MED ORDER — SODIUM CHLORIDE 0.9% FLUSH: 3.0000 mL | INTRAVENOUS | Status: DC | PRN

## 2018-01-06 MED ORDER — HEPARIN SOD (PORK) LOCK FLUSH 100 UNIT/ML IV SOLN: 250.0000 [IU] | Freq: Once | INTRAVENOUS | Status: DC | PRN

## 2018-01-06 MED ORDER — FAMOTIDINE IN NACL 20-0.9 MG/50ML-% IV SOLN: 20.0000 mg | Freq: Once | INTRAVENOUS | Status: DC | PRN

## 2018-01-06 MED ORDER — EPINEPHRINE PF 1 MG/ML IJ SOLN
0.5000 mg | Freq: Once | INTRAMUSCULAR | Status: DC | PRN
  Filled 2018-01-06: qty 1

## 2018-01-06 MED ORDER — EPINEPHRINE PF 1 MG/10ML IJ SOSY: 0.2500 mg | PREFILLED_SYRINGE | Freq: Once | INTRAMUSCULAR | Status: DC | PRN

## 2018-01-06 MED ORDER — SODIUM CHLORIDE 0.9% FLUSH: 10.0000 mL | INTRAVENOUS | Status: DC | PRN

## 2018-01-06 MED ORDER — SODIUM CHLORIDE 0.9 % IV SOLN
Freq: Once | INTRAVENOUS | Status: AC
  Administered 2018-01-06: 36 mg via INTRAVENOUS
  Filled 2018-01-06: qty 8

## 2018-01-06 MED ORDER — SODIUM CHLORIDE 0.9 % IV SOLN: Freq: Once | INTRAVENOUS | Status: DC

## 2018-01-06 MED ORDER — ALTEPLASE 2 MG IJ SOLR
2.0000 mg | Freq: Once | INTRAMUSCULAR | Status: DC | PRN
  Filled 2018-01-06: qty 2

## 2018-01-06 MED ORDER — DIPHENHYDRAMINE HCL 50 MG PO CAPS
50.0000 mg | ORAL_CAPSULE | Freq: Once | ORAL | Status: AC
  Administered 2018-01-06: 50 mg via ORAL
  Filled 2018-01-06: qty 1

## 2018-01-06 MED ORDER — METHYLPREDNISOLONE SODIUM SUCC 125 MG IJ SOLR: 125.0000 mg | Freq: Once | INTRAMUSCULAR | Status: DC | PRN

## 2018-01-06 MED ORDER — DIPHENHYDRAMINE HCL 50 MG/ML IJ SOLN: 25.0000 mg | Freq: Once | INTRAMUSCULAR | Status: DC | PRN

## 2018-01-06 MED ORDER — SODIUM CHLORIDE 0.9 % IV SOLN: Freq: Once | INTRAVENOUS | Status: DC | PRN

## 2018-01-06 MED ORDER — LEUCOVORIN CALCIUM INJECTION 100 MG
15.0000 mg | Freq: Four times a day (QID) | INTRAMUSCULAR | Status: AC
  Administered 2018-01-06 – 2018-01-07 (×4): 16 mg via INTRAVENOUS
  Filled 2018-01-06 (×4): qty 0.8

## 2018-01-06 MED ORDER — SODIUM CHLORIDE 0.9 % IV SOLN
375.0000 mg/m2 | Freq: Once | INTRAVENOUS | Status: AC
  Administered 2018-01-06: 900 mg via INTRAVENOUS
  Filled 2018-01-06: qty 50

## 2018-01-06 MED ORDER — ALBUTEROL SULFATE (2.5 MG/3ML) 0.083% IN NEBU: 2.5000 mg | INHALATION_SOLUTION | Freq: Once | RESPIRATORY_TRACT | Status: DC | PRN

## 2018-01-06 NOTE — Progress Notes (Signed)
PROGRESS NOTE    Tim [name]  YNW:[id number] DOB: 02/21/1956 DOA: 01/03/2018 PCP: Leonard Downing, MD  Brief Narrative:  Tim [name] is a 62 year old male with medical history of primary CNS lymphoma, diabetes type 2, gout, hypertension and other comorbids who was admitted at the request of oncology for second round of MTX and Rituximab in hospital. He recived MTX yesterday as Urine pH improved and is to get Rituxan today along with Lecovorin Rescue q6h.   Assessment & Plan:   Principal Problem:   Primary CNS lymphoma ([place]) Active Problems:   Essential hypertension   COPD  GOLD III    Chronic respiratory failure with hypoxia (HCC)   DM (diabetes mellitus), type 2 (HCC)   CNS lymphoma (HCC)  Primary CNS Diffuse Large B-Cell Lymphoma  -On Steroids with Dexamethasone 2 mg po Daily for cytotoxic edema by Neuro-Oncology and Dr. Mickeal Skinner recommending continuing to wean after chemo administration  -On Levitriacetam 500 mg po BID as outpt - will continue while hospitalized  -Chemotherapy with HD-Methotrexate with Leucovorin Resuce and Rituximab started by Oncology once Urine pH >7 and continue to Titrate to 8.0; Chemotherapy initiated by Neuro-Oncology 12/09/17 and this is Cycle 2 and repeat MTX was given yesterday  -Rituxan to be started today  -C/w Leucovorin Rescue q6h q24h Following MTX infusion per Neuro-Oncology -Oncology checking MTX Levels daily and is pending today  -Check Urine Dipstick per Oncology 4x daily until Urine pH reaches Target pH of 7.0 -C/w Daily CBC and CMP; Dr. Mickeal Skinner checking Daily MTX Levels until they are <0.10 -IVF per Neuro-Oncology and patient was started on 1/4 NS with 100 mEQ of Sodium Bicarbonate at 125 mL/hr and now has been reduced to 100 mL/hr -Acetazolamide 500 mg TID added to help Alkalinize urine but now D/C'd by Neuro-Oncology along with po Sodium Bicarbonate Tabs   Essential Hypertension -C/w Atenolol 50 mg po Daily  -Valsartan  160 mg po Daily currently being held but BP is currently at goal   COPD GOLD III/ Hx of OSA -Currently not in Exacerbation -C/w Breo Ellipta 200-25 mcg/INH 1 puff Daily  -C/w Albuterol 2.5 mg IH q4hprn Wheezing and SOB -Continue to Monitor Respiratory Status  -C/w Non-Pressurized 3 Liters Nocturnally  Uncontrolled DM (Diabetes Mellitus), type 2  -Increased Sensitive Novolog SSI to Moderate Novolog SSI AC/HS  -C/w Diabetic Diet and Strict Adherence -Checked A1c this visit and was 11.4  -Unable to afford Lantus that he was discharged on last visit so will start Novolin 70/30 12 units BID -Consult Diabetes Education Coordinator  -Continue to Monitor CBG's closely; CBG's imrpoving ranging from 166-324  Mild Thrombocytopenia -Platelet Count was 113 and now improved to 129 -Continue to Monitor for S/Sx of Bleeding -Continue to Monitor and Repeat CBC in AM  Hx of Gout -C/w Allopurinol 300 mg po Daily   LE Edema -Improving -C/w Compression stockings -Checked Albumin Level this AM and was 3.2  Hypokalemia -Patient's potassium was 3.1 and improved to 3.8 -Replete with p.o. potassium chloride 40 mg twice daily x2 doses yesterday  -Continue to monitor and replete as necessary -Repeat CMP in the a.m.  Normocytic Anemia -Patient's Hb/Hct is now 12.5/37.0 -Continue to Monitor for S/Sx of Bleeding -Repeat CBC in AM   Abnormal LFT's/Elevated LFT's -AST was 48 and ALT was 64 and likely 2/2 to MTX administration -Continue to Monitor and Trend and Repeat CMP in AM   DVT prophylaxis: Enoxaparin 40 mg sq q24h Code Status: FULL CODE Family Communication:  No family present at bedside Disposition Plan: Pending Improvement and Clearance by Neuro-Oncology   Consultants:   Neuro-Oncology Dr. Mickeal Skinner  Procedures: None   Antimicrobials:  Anti-infectives (From admission, onward)   None     Subjective: Seen and examined at bedside and stated he was doing well and had no  reaction to the chemotherapy. Also stated that he slept well. Feels like his legs were slightly more swollen but has been elevating them. No CP, SOB, Nausea, or Vomiting. No other concerns or complaints at this time.   Objective: Vitals:   01/05/18 2020 01/06/18 0439 01/06/18 0904 01/06/18 0906  BP: 117/74 130/74    Pulse: (!) 54 (!) 53    Resp: 16 16    Temp: 98 F (36.7 C) 97.6 F (36.4 C)    TempSrc: Oral Oral    SpO2: 95% 99% 97% 97%  Weight:      Height:        Intake/Output Summary (Last 24 hours) at 01/06/2018 1043 Last data filed at 01/06/2018 3846 Gross per 24 hour  Intake 5196.63 ml  Output 800 ml  Net 4396.63 ml   Filed Weights   01/05/18 0058  Weight: 111.2 kg   Examination: Physical Exam:  Constitutional: Well-nourished, well-developed obese Caucasian male currently no acute distress peers, comfortable resting in bed just awoken from sleep Eyes: Sclera anicteric.  Lids and conjunctive are normal ENMT: External ears and nose appear normal.  Grossly normal hearing.  Mucous membranes appear moist Neck: Neck is supple with no JVD Respiratory: Diminished to auscultation bilaterally with no appreciable wheezing, rales, rhonchi.  Normal respiratory effort and patient not tachypneic using accessory muscle breathe Cardiovascular: Regular rate and rhythm.  No appreciable murmurs, rubs, gallops.  Trace to 1+ lower extremity edema Abdomen: Soft, nontender, distended secondary second body habitus.  Bowel sounds present all quadrants GU: Deferred Musculoskeletal: No contractures or cyanosis.  No joint deformities noted Skin: Skin is warm and dry no appreciable rashes.  Has lower extremity cuts.  Neurologic: Cranial nerves II through XII grossly intact no appreciable focal deficits. Psychiatric: Normal Mood and affect.  Intact judgment and insight.  Patient is awake, alert, and oriented x3   Data Reviewed: I have personally reviewed following labs and imaging  studies  CBC: Recent Labs  Lab 01/03/18 1237 01/05/18 0530 01/06/18 0338  WBC 10.0 6.8 9.9  NEUTROABS 7.9* 4.6 8.9*  HGB 13.3 12.1* 12.5*  HCT 39.0 35.8* 37.0*  MCV 93.4 95.0 94.1  PLT 154 113* 659*   Basic Metabolic Panel: Recent Labs  Lab 01/03/18 1237 01/04/18 0610 01/05/18 0530 01/06/18 0338  NA 136 140 143 143  K 4.5 3.5 3.1* 3.8  CL 95* 96* 103 108  CO2 31 34* 32 28  GLUCOSE 344* 188* 180* 178*  BUN 17 14 15 18   CREATININE 0.95 0.56* 0.64 0.62  CALCIUM 9.1 8.3* 8.5* 8.6*  MG  --   --   --  2.3  PHOS  --   --   --  3.5   GFR: Estimated Creatinine Clearance: 130.6 mL/min (by C-G formula based on SCr of 0.62 mg/dL). Liver Function Tests: Recent Labs  Lab 01/03/18 1237 01/06/18 0338  AST 23 48*  ALT 44 64*  ALKPHOS 78 67  BILITOT 0.4 0.5  PROT 6.2* 5.8*  ALBUMIN 3.4* 3.2*   No results for input(s): LIPASE, AMYLASE in the last 168 hours. No results for input(s): AMMONIA in the last 168 hours. Coagulation Profile: No results  for input(s): INR, PROTIME in the last 168 hours. Cardiac Enzymes: No results for input(s): CKTOTAL, CKMB, CKMBINDEX, TROPONINI in the last 168 hours. BNP (last 3 results) Recent Labs    09/15/17 1730  PROBNP 49.0   HbA1C: Recent Labs    01/05/18 0530  HGBA1C 11.4*   CBG: Recent Labs  Lab 01/05/18 0727 01/05/18 1209 01/05/18 1716 01/05/18 2052 01/06/18 0720  GLUCAP 171* 108* 324* 213* 166*   Lipid Profile: No results for input(s): CHOL, HDL, LDLCALC, TRIG, CHOLHDL, LDLDIRECT in the last 72 hours. Thyroid Function Tests: No results for input(s): TSH, T4TOTAL, FREET4, T3FREE, THYROIDAB in the last 72 hours. Anemia Panel: No results for input(s): VITAMINB12, FOLATE, FERRITIN, TIBC, IRON, RETICCTPCT in the last 72 hours. Sepsis Labs: No results for input(s): PROCALCITON, LATICACIDVEN in the last 168 hours.  No results found for this or any previous visit (from the past 240 hour(s)).   Radiology Studies: No results  found.  Scheduled Meds: . acetaminophen  650 mg Oral Once  . allopurinol  300 mg Oral Daily  . atenolol  50 mg Oral Daily  . dexamethasone  2 mg Oral Daily  . diphenhydrAMINE  50 mg Oral Once  . enoxaparin (LOVENOX) injection  40 mg Subcutaneous Q24H  . fluticasone furoate-vilanterol  1 puff Inhalation Daily  . insulin aspart  0-15 Units Subcutaneous TID WC  . insulin aspart  0-5 Units Subcutaneous QHS  . insulin aspart protamine- aspart  12 Units Subcutaneous BID WC  . leucovorin  16 mg Intravenous Q6H  . levETIRAcetam  500 mg Oral BID  . multivitamin with minerals  1 tablet Oral Daily  . rituximab  375 mg/m2 (Treatment Plan Recorded) Intravenous Once  . sodium chloride flush  3 mL Intravenous Q12H   Continuous Infusions: . sodium chloride    . sodium chloride 10 mL/hr at 01/06/18 0100  . sodium chloride    . sodium chloride    . famotidine    . ondansetron [facility]) with dexamethasone (DECADRON) IV    .  sodium bicarbonate infusion 1/4 NS 1000 mL 100 mL/hr at 01/06/18 0404    LOS: 3 days   Kerney Elbe, DO [facility] PAGER is on AMION  If 7PM-7AM, please contact night-coverage [email or web address] Password [facility] 01/06/2018, 10:43 AM

## 2018-01-06 NOTE — Progress Notes (Signed)
[facility] [address] [place], [place] [address] [phone]   Inpatient Progress Note  Date of Service: 01/03/18 Patient Name: Tim [name] Patient MRN: [id number] Patient DOB: 11-28-1955 Provider: Ventura Sellers, MD  Identifying Statement:  Tim [name] is a 62 y.o. male with left frontal CNS lymphoma  Interval History:  Tim [name] has no complaints today. Completed rituximab infusion.   Medications: No current facility-administered medications on file prior to encounter.    Current Outpatient Medications on File Prior to Encounter  Medication Sig Dispense Refill  . acetaminophen (TYLENOL) 500 MG tablet Take 1,000 mg by mouth every 8 (eight) hours as needed (pain, fever, headache).     Marland Kitchen allopurinol (ZYLOPRIM) 300 MG tablet Take 300 mg by mouth daily.    Marland Kitchen atenolol (TENORMIN) 100 MG tablet Take 50 mg by mouth daily.    Marland Kitchen b complex vitamins tablet Take 1 tablet by mouth daily.    . budesonide-formoterol (SYMBICORT) 160-4.5 MCG/ACT inhaler Take 2 puffs first thing in am and then another 2 puffs about 12 hours later. 1 Inhaler 12  . dexamethasone (DECADRON) 4 MG tablet Take 1 tablet (4 mg total) by mouth daily. 30 tablet 0  . levETIRAcetam (KEPPRA) 500 MG tablet Take 1 tablet (500 mg total) by mouth 2 (two) times daily. 60 tablet 5  . metFORMIN (GLUCOPHAGE) 500 MG tablet Take 1 tablet (500 mg total) by mouth 2 (two) times daily with a meal. 60 tablet 3  . Multiple Vitamin (MULTIVITAMIN) tablet Take 1 tablet by mouth daily.    . pantoprazole (PROTONIX) 40 MG tablet Take 1 tablet by mouth daily. Take while on Decadron    . valsartan (DIOVAN) 160 MG tablet Take 1 tablet (160 mg total) by mouth daily. 30 tablet 11  . albuterol (PROVENTIL HFA;VENTOLIN HFA) 108 (90 Base) MCG/ACT inhaler Inhale 2 puffs into the lungs every 4 (four) hours as needed for wheezing or shortness of breath. (Patient not taking: Reported on  01/03/2018) 1 Inhaler 0  . blood glucose meter kit and supplies KIT Dispense based on patient and insurance preference. Use up to four times daily as directed. (FOR ICD-9 250.00, 250.01). 1 each 0  . Insulin Glargine (LANTUS SOLOSTAR) 100 UNIT/ML Solostar Pen Inject 18 Units into the skin daily. (Patient not taking: Reported on 01/03/2018) 15 mL 11  . Insulin Pen Needle (PEN NEEDLES) 31G X 5 MM MISC 1 Container by Does not apply route daily. (Patient not taking: Reported on 01/03/2018) 100 each 0    Allergies: No Known Allergies Past Medical History:  Past Medical History:  Diagnosis Date  . Arthritis   . Brain tumor ([place])   . COPD (chronic obstructive pulmonary disease) ([place])   . Diabetes mellitus without complication [facility])    patient states he was taken off meds at last appointment with PCP and is diet controlled  . Dyspnea   . Gout   . Hypertension 08/02/2017  . Pneumonia    Past Surgical History: none    Social History:  Social History   Socioeconomic History  . Marital status: Significant Other    Spouse name: [name]  . Number of children: Not on file  . Years of education: 12  . Highest education level: Bachelor's degree (e.g., BA, AB, BS)  Occupational History  . 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 Financial resource strain: Not hard  at all  . Food insecurity:    Worry: Never true    Inability: Never true  . Transportation needs:    Medical: No    Non-medical: No  Tobacco Use  . Smoking status: Former Smoker    Packs/day: 2.00    Years: 40.00    Pack years: 80.00    Types: Cigarettes    Last attempt to quit: 08/10/2011    Years since quitting: 6.4  . Smokeless tobacco: Never Used  Substance and Sexual Activity  . Alcohol use: Yes    Alcohol/week: 14.0 standard drinks    Types: 14 Cans of beer per week    Frequency: Never  . Drug use: Never  . Sexual activity: Not Currently  Lifestyle  . Physical activity:    Days  per week: 0 days    Minutes per session: 0 min  . Stress: Only a little  Relationships  . Social connections:    Talks on phone: Once a week    Gets together: Once a week    Attends religious service: Never    Active member of club or organization: No    Attends meetings of clubs or organizations: Never    Relationship status: Living with partner  . Intimate partner violence:    Fear of current or ex partner: No    Emotionally abused: No    Physically abused: No    Forced sexual activity: No  Other Topics Concern  . Not on file  Social History Narrative  . Not on file   Family History:  Family History  Problem Relation Age of Onset  . Cancer Brother     Review of Systems: Constitutional: Denies fevers, chills or abnormal weight loss Eyes: Denies blurriness of vision Ears, nose, mouth, throat, and face: Denies mucositis or sore throat Respiratory: chronic hoarseness Cardiovascular: Denies palpitation, chest discomfort or lower extremity swelling Gastrointestinal:  Denies nausea, constipation, diarrhea GU: Denies dysuria or incontinence Skin: Denies abnormal skin rashes Neurological: Per HPI Musculoskeletal: Denies joint pain, back or neck discomfort. No decrease in ROM Behavioral/Psych: Denies anxiety, disturbance in thought content, and mood instability  Physical Exam: Vitals:   01/06/18 1251 01/06/18 1325  BP: 126/66 134/67  Pulse: 62 62  Resp:    Temp: 98.4 F (36.9 C) 98 F (36.7 C)  SpO2: 100% 99%   KPS: 80. General: Alert, cooperative, pleasant, in no acute distress Head: Biopsy scar noted, dry and intact. EENT: No conjunctival injection or scleral icterus. Oral mucosa moist Lungs: Resp effort normal Cardiac: Regular rate and rhythm Abdomen: Soft, non-distended abdomen Skin: No rashes cyanosis or petechiae. Extremities: No clubbing or edema  Neurologic Exam: Mental Status: Awake, alert, attentive to examiner. Oriented to self and environment.  Language fluent with intact comprehension, repetition, reading. Cranial Nerves: Visual acuity is grossly normal. Visual fields are full. Extra-ocular movements intact. No ptosis. Face is symmetric, tongue midline. Motor: Tone and bulk are normal. Power 4+/5 in right arm and leg, subtle hip girdle weakness. Reflexes are symmetric, no pathologic reflexes present. Intact finger to nose bilaterally Sensory: Intact to light touch and temperature Gait: Normal, independent   Labs: I have reviewed the data as listed    Component Value Date/Time   NA 143 01/06/2018 0338   K 3.8 01/06/2018 0338   CL 108 01/06/2018 0338   CO2 28 01/06/2018 0338   GLUCOSE 178 (H) 01/06/2018 0338   BUN 18 01/06/2018 0338   CREATININE 0.62 01/06/2018 0338   CREATININE  0.95 01/03/2018 1237   CALCIUM 8.6 (L) 01/06/2018 0338   PROT 5.8 (L) 01/06/2018 0338   ALBUMIN 3.2 (L) 01/06/2018 0338   AST 48 (H) 01/06/2018 0338   AST 23 01/03/2018 1237   ALT 64 (H) 01/06/2018 0338   ALT 44 01/03/2018 1237   ALKPHOS 67 01/06/2018 0338   BILITOT 0.5 01/06/2018 0338   BILITOT 0.4 01/03/2018 1237   GFRNONAA >60 01/06/2018 0338   GFRNONAA >60 01/03/2018 1237   GFRAA >60 01/06/2018 0338   GFRAA >60 01/03/2018 1237   Lab Results  Component Value Date   WBC 9.9 01/06/2018   NEUTROABS 8.9 (H) 01/06/2018   HGB 12.5 (L) 01/06/2018   HCT 37.0 (L) 01/06/2018   MCV 94.1 01/06/2018   PLT 129 (L) 01/06/2018     Imaging:  CLINICAL DATA:  Initial treatment strategy for CNS lymphoma.  EXAM: NUCLEAR MEDICINE PET SKULL BASE TO THIGH  TECHNIQUE: 13.5 mCi F-18 FDG was injected intravenously. Full-ring PET imaging was performed from the skull base to thigh after the radiotracer. CT data was obtained and used for attenuation correction and anatomic localization.  Fasting blood glucose: 141 mg/dl  COMPARISON:  CT scans 08/04/2017 and MRI brain 08/16/2017.  FINDINGS: Mediastinal blood pool activity: SUV max  2.85  NECK: No hypermetabolic lymph nodes in the neck.  Incidental CT findings: none  CHEST: Numerous ill-defined hazy pulmonary lesions are hypermetabolic. Findings most consistent with some type of inflammatory or infectious lung disease or possibly drug reaction. There also hypermetabolic hilar and mediastinal nodes. These are not enlarged however and again I think these are probably reactive. This does not look like lymphoma in the lungs and the chest CT from 08/04/2017 was relatively normal.  Incidental CT findings: none  ABDOMEN/PELVIS: No abnormal hypermetabolic activity within the liver, pancreas, adrenal glands, or spleen. No hypermetabolic lymph nodes in the abdomen or pelvis.  Incidental CT findings: The liver and spleen are normal in size.  SKELETON: No focal hypermetabolic activity to suggest skeletal metastasis.  Incidental CT findings: none  IMPRESSION: 1. Numerous pulmonary lesions which are hypermetabolic along with hypermetabolic small mediastinal and hilar nodes. Findings most likely due to an inflammatory or infectious pulmonary process or possible drug reaction. Pulmonary lymphoma is highly unlikely. Recommend correlation with clinical findings and appropriate treatment. A short-term follow-up chest CT may be helpful (2-3 months). 2. No findings for lymphoma Nissen vomit of the abdomen/pelvis or skeletal structures. No axillary or inguinal adenopathy.   Electronically Signed   By: Marijo Sanes M.D.   On: 09/10/2017 17:09  [facility] Clinician Interpretation: I have personally reviewed the MRI brain with and without contrast from outside institution dated 11/09/17.  My interpretation, in the context of the patient's clinical presentation, is progressive disease  Pathology     Assessment/Plan  Primary CNS lymphoma ([place]) - Plan: ondansetron (ZOFRAN) 16 mg, dexamethasone (DECADRON) 20 mg in sodium chloride 0.9 % 50 mL IVPB, methotrexate (PF)  8.61 g in sodium chloride 0.9 % 1,000 mL chemo infusion, 0.9 %  sodium chloride infusion, PHYSICIAN COMMUNICATION ORDER, PHYSICIAN COMMUNICATION ORDER, 0.9 %  sodium chloride infusion, sodium chloride flush (NS) 0.9 % injection 10 mL, heparin lock flush 100 unit/mL, heparin lock flush 100 unit/mL, alteplase (CATHFLO ACTIVASE) injection 2 mg, sodium chloride flush (NS) 0.9 % injection 3 mL, acetaminophen (TYLENOL) tablet 650 mg, diphenhydrAMINE (BENADRYL) capsule 50 mg, riTUXimab (RITUXAN) 900 mg in sodium chloride 0.9 % 160 mL infusion, diphenhydrAMINE (BENADRYL) injection 25 mg, famotidine (PEPCID) IVPB  20 mg premix, 0.9 %  sodium chloride infusion, methylPREDNISolone sodium succinate (SOLU-MEDROL) 125 mg/2 mL injection 125 mg, EPINEPHrine (ADRENALIN) 1 MG/10ML injection 0.25 mg, EPINEPHrine (ADRENALIN) 1 MG/10ML injection 0.25 mg, EPINEPHrine (ADRENALIN) 0.5 mg, EPINEPHrine (ADRENALIN) 0.5 mg, diphenhydrAMINE (BENADRYL) injection 50 mg, albuterol (PROVENTIL) (2.5 MG/3ML) 0.083% nebulizer solution 2.5 mg, ondansetron (ZOFRAN) 16 mg, dexamethasone (DECADRON) 20 mg in sodium chloride 0.9 % 50 mL IVPB, leucovorin injection 16 mg, DISCONTINUED: sodium chloride 0.225 % with sodium bicarbonate 100 mEq infusion, DISCONTINUED: acetaZOLAMIDE (DIAMOX) tablet 500 mg, DISCONTINUED: leucovorin injection 16 mg   01/06/18 updated recommendations:  -Completed rituximab infusion -Con't Leucovorin and IV fluids at current rate -f/u 4am MTX level tomorrow**  **depending on serum level, t/c resending 11am or 12pm (repeat) MTX level given lack of lab availability over weekend**  Tim [name] is clinically stable today and ready for initiation of cycle #2 HD-MTX and rituximab.    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   -appropriate to proceed with C2 of high dose MTX with leucovorin  rescue -MTX-R therapy C2D1today: Methotrexate 3.5g/m2 given over 4 hours once urine pH at or above 7.0 -Leucovorin 62m q6 hours 24 hours after MTX infusion   -Rituxan can be given 24-48 hours after MTX infusion -To start ASAP: IV fluids, 1/4NS with 1033m sodium bicarbonate at 12526mr.  Will adjust rate until urine pH at or above 7.0, con't to titrate to 8.0. -daily labs with cbc, cmp -4x daily urine pH once it reaches target pH of 7 -daily MTX levels until MTX levels <0.10 -continue same supportive medications  -ok to continue decadron 2mg49mily.  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 -please to do not administer proton pump inhibitors during MTX admissions  2) Focal seizures -Con't 500mg79m Keppra  3) Reported sleep apnea/hypopnea -Uses non-pressurized oxygen 3L nocturnal.  Doesn't require O2 even with exertion during the day.   4) Diabetes -Insulin sliding scale -Never obtained standing insulin for home use.  Please consult diabetes coordinator and social work for access if standing insulin required for discharge.  We appreciate the opportunity to participate in the care of Tim MCGILLICUDDYill continue to follow daily during this admission.  Direct contact # is [phone] ZachaVentura SellersMedical Director of Neuro-Oncology [facility] CenteresleBristow6/19 4:31 PM

## 2018-01-07 LAB — URINALYSIS, DIPSTICK ONLY
Bilirubin Urine: NEGATIVE
Bilirubin Urine: NEGATIVE
GLUCOSE, UA: NEGATIVE mg/dL
Glucose, UA: >=500 mg/dL — AB
Hgb urine dipstick: NEGATIVE
Hgb urine dipstick: NEGATIVE
KETONES UR: NEGATIVE mg/dL
Ketones, ur: NEGATIVE mg/dL
LEUKOCYTES UA: NEGATIVE
LEUKOCYTES UA: NEGATIVE
NITRITE: NEGATIVE
Nitrite: NEGATIVE
PH: 7 (ref 5.0–8.0)
Protein, ur: NEGATIVE mg/dL
Protein, ur: NEGATIVE mg/dL
SPECIFIC GRAVITY, URINE: 1.014 (ref 1.005–1.030)
SPECIFIC GRAVITY, URINE: 1.016 (ref 1.005–1.030)
pH: 6 (ref 5.0–8.0)

## 2018-01-07 LAB — GLUCOSE, CAPILLARY
GLUCOSE-CAPILLARY: 152 mg/dL — AB (ref 70–99)
Glucose-Capillary: 229 mg/dL — ABNORMAL HIGH (ref 70–99)

## 2018-01-07 LAB — CBC WITH DIFFERENTIAL/PLATELET
BASOS ABS: 0 10*3/uL (ref 0.0–0.1)
Basophils Relative: 0 %
EOS PCT: 0 %
Eosinophils Absolute: 0 10*3/uL (ref 0.0–0.7)
HEMATOCRIT: 34 % — AB (ref 39.0–52.0)
Hemoglobin: 11.6 g/dL — ABNORMAL LOW (ref 13.0–17.0)
LYMPHS PCT: 6 %
Lymphs Abs: 0.6 10*3/uL — ABNORMAL LOW (ref 0.7–4.0)
MCH: 32.3 pg (ref 26.0–34.0)
MCHC: 34.1 g/dL (ref 30.0–36.0)
MCV: 94.7 fL (ref 78.0–100.0)
Monocytes Absolute: 0.4 10*3/uL (ref 0.1–1.0)
Monocytes Relative: 5 %
NEUTROS ABS: 8.1 10*3/uL — AB (ref 1.7–7.7)
Neutrophils Relative %: 89 %
Platelets: 123 10*3/uL — ABNORMAL LOW (ref 150–400)
RBC: 3.59 MIL/uL — AB (ref 4.22–5.81)
RDW: 13.9 % (ref 11.5–15.5)
WBC: 9.1 10*3/uL (ref 4.0–10.5)

## 2018-01-07 LAB — METHOTREXATE: METHOTREXATE: 0.08

## 2018-01-07 LAB — COMPREHENSIVE METABOLIC PANEL
ALBUMIN: 3 g/dL — AB (ref 3.5–5.0)
ALT: 68 U/L — AB (ref 0–44)
AST: 46 U/L — AB (ref 15–41)
Alkaline Phosphatase: 59 U/L (ref 38–126)
Anion gap: 7 (ref 5–15)
BUN: 22 mg/dL (ref 8–23)
CHLORIDE: 105 mmol/L (ref 98–111)
CO2: 27 mmol/L (ref 22–32)
CREATININE: 0.65 mg/dL (ref 0.61–1.24)
Calcium: 8.5 mg/dL — ABNORMAL LOW (ref 8.9–10.3)
GFR calc Af Amer: >60 mL/min (ref >60)
GLUCOSE: 288 mg/dL — AB (ref 70–99)
Potassium: 3.9 mmol/L (ref 3.5–5.1)
Sodium: 139 mmol/L (ref 135–145)
Total Bilirubin: 0.5 mg/dL (ref 0.3–1.2)
Total Protein: 5.4 g/dL — ABNORMAL LOW (ref 6.5–8.1)

## 2018-01-07 LAB — PHOSPHORUS: Phosphorus: 3.4 mg/dL (ref 2.5–4.6)

## 2018-01-07 LAB — MAGNESIUM: MAGNESIUM: 2.2 mg/dL (ref 1.7–2.4)

## 2018-01-07 MED ORDER — "PEN NEEDLES 3/16"" 31G X 5 MM MISC": 1.0000 | Freq: Two times a day (BID) | 0 refills | Status: AC

## 2018-01-07 MED ORDER — ACETAZOLAMIDE ER 500 MG PO CP12
500.0000 mg | ORAL_CAPSULE | Freq: Two times a day (BID) | ORAL | Status: DC
  Administered 2018-01-07: 500 mg via ORAL
  Filled 2018-01-07: qty 1

## 2018-01-07 MED ORDER — LEUCOVORIN CALCIUM INJECTION 100 MG
15.0000 mg | Freq: Four times a day (QID) | INTRAMUSCULAR | Status: DC
  Filled 2018-01-07 (×3): qty 0.8

## 2018-01-07 MED ORDER — SODIUM CHLORIDE 0.9 % IV SOLN
Freq: Once | INTRAVENOUS | Status: DC
  Filled 2018-01-07: qty 8

## 2018-01-07 MED ORDER — INSULIN ISOPHANE & REGULAR (HUMAN 70-30)100 UNIT/ML KWIKPEN: 15.0000 [IU] | PEN_INJECTOR | Freq: Two times a day (BID) | SUBCUTANEOUS | 11 refills | Status: AC

## 2018-01-07 MED ORDER — INSULIN ASPART PROT & ASPART (70-30 MIX) 100 UNIT/ML ~~LOC~~ SUSP
15.0000 [IU] | Freq: Two times a day (BID) | SUBCUTANEOUS | Status: DC
  Filled 2018-01-07: qty 10

## 2018-01-07 MED ORDER — DEXAMETHASONE 2 MG PO TABS: 2.0000 mg | ORAL_TABLET | Freq: Every day | ORAL | 0 refills | Status: DC

## 2018-01-07 MED ORDER — STERILE WATER FOR INJECTION IV SOLN
INTRAVENOUS | Status: DC
  Administered 2018-01-07: 11:00:00 via INTRAVENOUS
  Filled 2018-01-07: qty 850

## 2018-01-07 NOTE — Discharge Summary (Signed)
Physician Discharge Summary  Tim [name] MRN:[id number] DOB: 03/21/1956 DOA: 01/03/2018  PCP: Elkins, Wilson Oliver, MD  Admit date: 01/03/2018 Discharge date: 01/07/2018  Admitted From: Home Disposition: Home  Recommendations for Outpatient Follow-up:  1. Follow up with PCP in 1-2 weeks 2. Follow up with Neuro-Oncology Dr. Vaslow within 1-2 weeks 3. Please obtain CMP/CBC, Mag, Phos in one week 4. Please follow up on the following pending results:  Home Health: Home Equipment/Devices: None  Discharge Condition: Stable CODE STATUS: FULL CODE Diet recommendation: Carb Modified Diet   Brief/Interim Summary: Tim Lewisis a 62-year-old male with medical history of primary CNS lymphoma, diabetes type 2, gout, hypertension and other comorbids who was admitted at the request of oncology for second round ofMTX and Rituximab in hospital.He recived MTX as Urine pH improved along with Rituxan and Lecovorin Rescue q6h. His MTX Level was <0.10 and he was deemed medically stable for D/C. Hospitalization was complicated by uncontrolled hyperglycemia secondary to diabetes mellitus type 2 and the diabetes education coordinator was consulted and patient was placed on Novolin 70/30 and will continue that at discharge.  Patient will need follow-up with primary care physician and neuro-oncology in outpatient setting within 1 to 2 weeks.  Discharge Diagnoses:  Principal Problem:   Primary CNS lymphoma (HCC) Active Problems:   Essential hypertension   COPD  GOLD III    Chronic respiratory failure with hypoxia (HCC)   DM (diabetes mellitus), type 2 (HCC)   CNS lymphoma (HCC)  Primary CNS Diffuse Large B-Cell Lymphoma  -On Steroids with Dexamethasone 2 mg po Daily for cytotoxic edema by Neuro-Oncology and Dr. Vaslow recommending continuing to wean after chemo administration as an outpatient  -On Levitriacetam 500 mg po BID as outpt - will continue while hospitalized  -Chemotherapy with  HD-Methotrexate with Leucovorin Resuce and Rituximab started by Oncology once Urine pH >7 and continue to Titrate to 8.0; Chemotherapy initiated by Neuro-Oncology 12/09/17 and this is Cycle 2 and repeat MTX this hospitalization   -Rituxan was also administered yesterday  -Given Leucovorin Rescue q6h q24h Following MTX infusion per Neuro-Oncology -Oncology checking MTX Levels daily and yesterday was 11.01 and today was 0.08 -Checked Urine Dipstick per Oncology 4x daily until Urine pH reaches Target pH of 7.0 -C/w Daily CBC and CMP; Dr. Vaslow checking Daily MTX Levels until they are <0.10; since MTX Level was <0.10 ok to D/C per Neuro-Oncology  -IVFper Neuro-Oncologyand patient was started on 1/4 NS with 100 mEQ of Sodium Bicarbonate at 125 mL/hr and now has been reduced to 100 mL/hr and now will D/C  -Acetazolamide 500 mg TID added to help Alkalinize urine but now D/C'd by Neuro-Oncology along with po Sodium Bicarbonate Tabs  -Follow up with Neuro-Oncology in the outpatient setting   Essential Hypertension -C/w Atenolol 50 mg po Daily  -Valsartan 160 mg po Daily currently being held but BP is currently at goal and can resume at D/C  COPD GOLD III/ Hx of OSA -Currently not in Exacerbation -C/w Breo Ellipta 200-25 mcg/INH 1 puff Daily  -C/w Albuterol 2.5 mg IH q4hprn Wheezing and SOB -Continue to Monitor Respiratory Status -C/w Non-Pressurized 3 Liters Nocturnally  Uncontrolled DM (Diabetes Mellitus), type 2  -Increased Sensitive Novolog SSI to Moderate Novolog SSI AC/HS  -C/w Diabetic Diet and Strict Adherence -Checked A1c this visit and was11.4 -Unable to afford Lantus that he was discharged on last visit so will start Novolin 70/30 12 units BID and increased to 15 units BID and will D/C   with this and pen needles as this is more affordable  -Consult Diabetes Education Coordinator  -Continue to Monitor CBG's closely; CBG's imrpoving ranging [phone]  Mild  Thrombocytopenia -Platelet Count was 113 and now improved to 123 -Continue to Monitor for S/Sx of Bleeding -Continue to Monitor and Repeat CBC in AM  Hx of Gout -C/w Allopurinol 300 mg po Daily   LE Edema -Improving and stable -C/w Compression stockings -Checked Albumin Level this AM and was 3.0  Hypokalemia -Patient's potassium was 3.1 and improved to 39 -Continue to monitor and replete as necessary -Repeat CMP in the a.m.  Normocytic Anemia -Patient's Hb/Hct is now 11.6/34.0 -Continue to Monitor for S/Sx of Bleeding -Repeat CBC in AM  Abnormal LFT's/Elevated LFT's -AST was 48 and ALT was 64 and likely 2/2 to MTX administration; Was stable today with AST being 46 and ALT being 68 -Continue to Monitor and Trend and Repeat CMP as an outpatient    Discharge Instructions  Allergies as of 01/07/2018   No Known Allergies     Medication List    STOP taking these medications   Insulin Glargine 100 UNIT/ML Solostar Pen Commonly known as:  LANTUS   sodium bicarbonate 650 MG tablet     TAKE these medications   acetaminophen 500 MG tablet Commonly known as:  TYLENOL Take 1,000 mg by mouth every 8 (eight) hours as needed (pain, fever, headache).   albuterol 108 (90 Base) MCG/ACT inhaler Commonly known as:  PROVENTIL HFA;VENTOLIN HFA Inhale 2 puffs into the lungs every 4 (four) hours as needed for wheezing or shortness of breath.   allopurinol 300 MG tablet Commonly known as:  ZYLOPRIM Take 300 mg by mouth daily.   atenolol 100 MG tablet Commonly known as:  TENORMIN Take 50 mg by mouth daily.   b complex vitamins tablet Take 1 tablet by mouth daily.   blood glucose meter kit and supplies Kit Dispense based on patient and insurance preference. Use up to four times daily as directed. (FOR ICD-9 250.00, 250.01).   budesonide-formoterol 160-4.5 MCG/ACT inhaler Commonly known as:  SYMBICORT Take 2 puffs first thing in am and then another 2 puffs about 12 hours  later.   dexamethasone 2 MG tablet Commonly known as:  DECADRON Take 1 tablet (2 mg total) by mouth daily. Start taking on:  01/08/2018 What changed:    medication strength  how much to take   Insulin Isophane & Regular Human (70-30) 100 UNIT/ML PEN Commonly known as:  HUMULIN 70/30 MIX Inject 15 Units into the skin 2 (two) times daily.   levETIRAcetam 500 MG tablet Commonly known as:  KEPPRA Take 1 tablet (500 mg total) by mouth 2 (two) times daily.   metFORMIN 500 MG tablet Commonly known as:  GLUCOPHAGE Take 1 tablet (500 mg total) by mouth 2 (two) times daily with a meal.   multivitamin tablet Take 1 tablet by mouth daily.   pantoprazole 40 MG tablet Commonly known as:  PROTONIX Take 1 tablet by mouth daily. Take while on Decadron   Pen Needles 3/16" 31G X 5 MM Misc 1 Container by Does not apply route 2 (two) times daily. What changed:  when to take this   valsartan 160 MG tablet Commonly known as:  DIOVAN Take 1 tablet (160 mg total) by mouth daily.      Follow-up Information    Elkins, Wilson Oliver, MD Follow up.   Specialty:  Family Medicine Contact information: [address] [place] [address]   [phone]        Ventura Sellers, MD. Call.   Specialties:  Psychiatry, Neurology, Oncology Why:  Follow up within 1 week Contact information: [place] [address] [phone]          No Known Allergies  Consultations:  Neuro-Oncology  Procedures/Studies: Ir Imaging Guided Port Insertion  Result Date: 12/27/2017 CLINICAL DATA:  Primary CNS lymphoma and need for porta cath for chemotherapy. EXAM: IMPLANTED PORT A CATH PLACEMENT WITH ULTRASOUND AND FLUOROSCOPIC GUIDANCE ANESTHESIA/SEDATION: 3.0 mg IV Versed; 100 mcg IV Fentanyl Total Moderate Sedation Time:  36 minutes The patient's level of consciousness and physiologic status were continuously monitored during the procedure by Radiology nursing. Additional  Medications: 2 g IV Ancef. FLUOROSCOPY TIME:  30 seconds.  10.8 mGy. PROCEDURE: The procedure, risks, benefits, and alternatives were explained to the patient. Questions regarding the procedure were encouraged and answered. The patient understands and consents to the procedure. A time-out was performed prior to initiating the procedure. Ultrasound was utilized to confirm patency of the right internal jugular vein. The right neck and chest were prepped with chlorhexidine in a sterile fashion, and a sterile drape was applied covering the operative field. Maximum barrier sterile technique with sterile gowns and gloves were used for the procedure. Local anesthesia was provided with 1% lidocaine. After creating a small venotomy incision, a 21 gauge needle was advanced into the right internal jugular vein under direct, real-time ultrasound guidance. Ultrasound image documentation was performed. After securing guidewire access, an 8 Fr dilator was placed. A J-wire was kinked to measure appropriate catheter length. A subcutaneous port pocket was then created along the upper chest wall utilizing sharp and blunt dissection. Portable cautery was utilized. The pocket was irrigated with sterile saline. A single lumen power injectable port was chosen for placement. The 8 Fr catheter was tunneled from the port pocket site to the venotomy incision. The port was placed in the pocket. External catheter was trimmed to appropriate length based on guidewire measurement. At the venotomy, an 8 Fr peel-away sheath was placed over a guidewire. The catheter was then placed through the sheath and the sheath removed. Final catheter positioning was confirmed and documented with a fluoroscopic spot image. The port was accessed with a needle and aspirated and flushed with heparinized saline. The access needle was removed. The venotomy and port pocket incisions were closed with subcutaneous 3-0 Monocryl and subcuticular 4-0 Vicryl. Dermabond was  applied to both incisions. COMPLICATIONS: COMPLICATIONS None FINDINGS: After catheter placement, the tip lies at the cavo-atrial junction. The catheter aspirates normally and is ready for immediate use. IMPRESSION: Placement of single lumen port a cath via right internal jugular vein. The catheter tip lies at the cavo-atrial junction. A power injectable port a cath was placed and is ready for immediate use. Electronically Signed   By: Aletta Edouard M.D.   On: 12/27/2017 15:19    Subjective: Seen and examined this morning and states that he slept okay.  Had some mild abdominal discomfort however was improved.  No chest pain, shortness breath, nausea, vomiting.  No other concerns or complaints at this time and was deemed stable to be discharged home.  Discharge Exam: Vitals:   01/07/18 0617 01/07/18 0828  BP: (!) 141/73   Pulse: (!) 49   Resp: 16   Temp: 97.7 F (36.5 C)   SpO2: 97% 96%   Vitals:   01/06/18 1325 01/06/18 2102 01/07/18 0617 01/07/18 0828  BP: 134/67 138/77 Marland Kitchen)  141/73   Pulse: 62 (!) 54 (!) 49   Resp:  14 16   Temp: 98 F (36.7 C) 98 F (36.7 C) 97.7 F (36.5 C)   TempSrc: Oral Oral Oral   SpO2: 99% 95% 97% 96%  Weight:      Height:       General: Pt is alert, awake, not in acute distress Cardiovascular: RRR, S1/S2 +, no rubs, no gallops Respiratory: Diminished bilaterally, no wheezing, no rhonchi Abdominal: Soft, NT, Distended slightly 2/2 body habitus, bowel sounds + Extremities: mild LE edema, no cyanosis  The results of significant diagnostics from this hospitalization (including imaging, microbiology, ancillary and laboratory) are listed below for reference.    Microbiology: No results found for this or any previous visit (from the past 240 hour(s)).   Labs: BNP (last 3 results) No results for input(s): BNP in the last 8760 hours. Basic Metabolic Panel: Recent Labs  Lab 01/03/18 1237 01/04/18 0610 01/05/18 0530 01/06/18 0338 01/07/18 0348  NA 136  140 143 143 139  K 4.5 3.5 3.1* 3.8 3.9  CL 95* 96* 103 108 105  CO2 31 34* 32 28 27  GLUCOSE 344* 188* 180* 178* 288*  BUN _0 CREATININE 0.95 0.56* 0.64 0.62 0.65  CALCIUM 9.1 8.3* 8.5* 8.6* 8.5*  MG  --   --   --  2.3 2.2  PHOS  --   --   --  3.5 3.4   Liver Function Tests: Recent Labs  Lab 01/03/18 1237 01/06/18 0338 01/07/18 0348  AST 23 48* 46*  ALT 44 64* 68*  ALKPHOS 78 67 59  BILITOT 0.4 0.5 0.5  PROT 6.2* 5.8* 5.4*  ALBUMIN 3.4* 3.2* 3.0*   No results for input(s): LIPASE, AMYLASE in the last 168 hours. No results for input(s): AMMONIA in the last 168 hours. CBC: Recent Labs  Lab 01/03/18 1237 01/05/18 0530 01/06/18 0338 01/07/18 0348  WBC 10.0 6.8 9.9 9.1  NEUTROABS 7.9* 4.6 8.9* 8.1*  HGB 13.3 12.1* 12.5* 11.6*  HCT 39.0 35.8* 37.0* 34.0*  MCV 93.4 95.0 94.1 94.7  PLT 154 113* 129* 123*   Cardiac Enzymes: No results for input(s): CKTOTAL, CKMB, CKMBINDEX, TROPONINI in the last 168 hours. BNP: Invalid input(s): POCBNP CBG: Recent Labs  Lab 01/06/18 0720 01/06/18 1126 01/06/18 1714 01/06/18 2104 01/07/18 0713  GLUCAP 166* 171* 326* 285* 229*   D-Dimer No results for input(s): DDIMER in the last 72 hours. Hgb A1c Recent Labs    01/05/18 0530  HGBA1C 11.4*   Lipid Profile No results for input(s): CHOL, HDL, LDLCALC, TRIG, CHOLHDL, LDLDIRECT in the last 72 hours. Thyroid function studies No results for input(s): TSH, T4TOTAL, T3FREE, THYROIDAB in the last 72 hours.  Invalid input(s): FREET3 Anemia work up No results for input(s): VITAMINB12, FOLATE, FERRITIN, TIBC, IRON, RETICCTPCT in the last 72 hours. Urinalysis    Component Value Date/Time   COLORURINE YELLOW 01/07/2018 0503   APPEARANCEUR HAZY (A) 01/07/2018 0503   LABSPEC 1.014 01/07/2018 0503   PHURINE 6.0 01/07/2018 0503   GLUCOSEU >=500 (A) 01/07/2018 0503   HGBUR NEGATIVE 01/07/2018 0503   BILIRUBINUR NEGATIVE 01/07/2018 0503   KETONESUR NEGATIVE 01/07/2018  0503   PROTEINUR NEGATIVE 01/07/2018 0503   UROBILINOGEN 0.2 03/28/2007 1459   NITRITE NEGATIVE 01/07/2018 0503   LEUKOCYTESUR NEGATIVE 01/07/2018 0503   Sepsis Labs Invalid input(s): PROCALCITONIN,  WBC,  LACTICIDVEN Microbiology No results found for this or any previous visit (from the  past 240 hour(s)).  Time coordinating discharge: 35 minutes  SIGNED:   Latif , DO [facility] 01/07/2018, 11:32 AM Pager is on AMION  If 7PM-7AM, please contact night-coverage [email or web address] Password [email or web address] 

## 2018-01-07 NOTE — Progress Notes (Addendum)
Dr Mickeal Skinner notified that the urine PH was 6.0. V. O to increase NaBicarb to 125 ml hr. Orders to follow..    [id number] Pt verbalized understanding of dc orders and scripts andappts

## 2018-01-07 NOTE — Progress Notes (Signed)
Inpatient Diabetes Program Recommendations  AACE/ADA: New Consensus Statement on Inpatient Glycemic Control (2015)  Target Ranges:  Prepandial:   less than 140 mg/dL      Peak postprandial:   less than 180 mg/dL (1-2 hours)      Critically ill patients:  140 - 180 mg/dL   Lab Results  Component Value Date   GLUCAP 229 (H) 01/07/2018   HGBA1C 11.4 (H) 01/05/2018    Review of Glycemic Control  Blood sugars still > 180 mg/dL. Continue to titrate 70/30 insulin. Received 20 units of Novolog correction on 08/29.  Inpatient Diabetes Program Recommendations:     Increase 70/30 to 15 units bid.  Will need to order Novolin 70/30 pen at discharge ($44) Needs pen needles. [phone]  Will see pt after lunch today to review insulin pen administration.   Thank you. Lorenda Peck, RD, LDN, CDE Inpatient Diabetes Coordinator [phone]

## 2018-01-20 ENCOUNTER — Telehealth: Payer: Self-pay | Admitting: Internal Medicine

## 2018-01-20 ENCOUNTER — Other Ambulatory Visit: Payer: Self-pay | Admitting: *Deleted

## 2018-01-20 DIAGNOSIS — C8589 Other specified types of non-Hodgkin lymphoma, extranodal and solid organ sites: Secondary | ICD-10-CM

## 2018-01-20 DIAGNOSIS — C8339 Primary central nervous system lymphoma: Secondary | ICD-10-CM

## 2018-01-20 NOTE — Telephone Encounter (Signed)
Spoke to pt regarding appts per 9/12 sch message

## 2018-01-24 ENCOUNTER — Other Ambulatory Visit: Payer: Medicare Other

## 2018-01-24 ENCOUNTER — Inpatient Hospital Stay: Payer: Medicare Other

## 2018-01-24 ENCOUNTER — Inpatient Hospital Stay: Payer: Medicare Other | Admitting: Internal Medicine

## 2018-01-27 ENCOUNTER — Other Ambulatory Visit: Payer: Self-pay | Admitting: Internal Medicine

## 2018-01-27 MED ORDER — ACETAZOLAMIDE 250 MG PO TABS: 500.0000 mg | ORAL_TABLET | Freq: Two times a day (BID) | ORAL | 0 refills | Status: DC

## 2018-01-31 ENCOUNTER — Encounter (HOSPITAL_COMMUNITY): Payer: Self-pay

## 2018-01-31 ENCOUNTER — Inpatient Hospital Stay (HOSPITAL_COMMUNITY)
Admission: AD | Admit: 2018-01-31 | Discharge: 2018-02-03 | DRG: 847 | Disposition: A | Discharge status: Home / Self Care | Payer: Medicare Other | Source: Ambulatory Visit | Attending: Family Medicine | Admitting: Family Medicine

## 2018-01-31 ENCOUNTER — Inpatient Hospital Stay: Payer: Medicare Other | Attending: Internal Medicine

## 2018-01-31 ENCOUNTER — Other Ambulatory Visit: Payer: Self-pay

## 2018-01-31 ENCOUNTER — Inpatient Hospital Stay (HOSPITAL_BASED_OUTPATIENT_CLINIC_OR_DEPARTMENT_OTHER): Payer: Medicare Other | Admitting: Internal Medicine

## 2018-01-31 ENCOUNTER — Inpatient Hospital Stay: Payer: Medicare Other

## 2018-01-31 VITALS — BP 111/78 | HR 62 | Temp 97.6°F | Resp 18 | Ht 75.0 in | Wt 247.8 lb

## 2018-01-31 DIAGNOSIS — E119 Type 2 diabetes mellitus without complications: Secondary | ICD-10-CM | POA: Diagnosis present

## 2018-01-31 DIAGNOSIS — Z87891 Personal history of nicotine dependence: Secondary | ICD-10-CM

## 2018-01-31 DIAGNOSIS — Z7951 Long term (current) use of inhaled steroids: Secondary | ICD-10-CM | POA: Diagnosis not present

## 2018-01-31 DIAGNOSIS — Z794 Long term (current) use of insulin: Secondary | ICD-10-CM

## 2018-01-31 DIAGNOSIS — Z79899 Other long term (current) drug therapy: Secondary | ICD-10-CM

## 2018-01-31 DIAGNOSIS — E1165 Type 2 diabetes mellitus with hyperglycemia: Secondary | ICD-10-CM | POA: Diagnosis not present

## 2018-01-31 DIAGNOSIS — C8589 Other specified types of non-Hodgkin lymphoma, extranodal and solid organ sites: Secondary | ICD-10-CM

## 2018-01-31 DIAGNOSIS — Z5111 Encounter for antineoplastic chemotherapy: Principal | ICD-10-CM

## 2018-01-31 DIAGNOSIS — C8331 Diffuse large B-cell lymphoma, lymph nodes of head, face, and neck: Secondary | ICD-10-CM | POA: Diagnosis not present

## 2018-01-31 DIAGNOSIS — R569 Unspecified convulsions: Secondary | ICD-10-CM | POA: Insufficient documentation

## 2018-01-31 DIAGNOSIS — C8339 Diffuse large B-cell lymphoma, extranodal and solid organ sites: Secondary | ICD-10-CM | POA: Diagnosis present

## 2018-01-31 DIAGNOSIS — G4730 Sleep apnea, unspecified: Secondary | ICD-10-CM | POA: Diagnosis present

## 2018-01-31 DIAGNOSIS — M109 Gout, unspecified: Secondary | ICD-10-CM | POA: Diagnosis present

## 2018-01-31 DIAGNOSIS — J449 Chronic obstructive pulmonary disease, unspecified: Secondary | ICD-10-CM | POA: Diagnosis present

## 2018-01-31 DIAGNOSIS — I10 Essential (primary) hypertension: Secondary | ICD-10-CM | POA: Diagnosis present

## 2018-01-31 DIAGNOSIS — Z9981 Dependence on supplemental oxygen: Secondary | ICD-10-CM | POA: Diagnosis not present

## 2018-01-31 DIAGNOSIS — G40109 Localization-related (focal) (partial) symptomatic epilepsy and epileptic syndromes with simple partial seizures, not intractable, without status epilepticus: Secondary | ICD-10-CM | POA: Diagnosis not present

## 2018-01-31 LAB — CMP (CANCER CENTER ONLY)
ALT: 22 U/L (ref 0–44)
ANION GAP: 8 (ref 5–15)
AST: 16 U/L (ref 15–41)
Albumin: 3.5 g/dL (ref 3.5–5.0)
Alkaline Phosphatase: 108 U/L (ref 38–126)
BILIRUBIN TOTAL: 0.8 mg/dL (ref 0.3–1.2)
BUN: 18 mg/dL (ref 8–23)
CHLORIDE: 109 mmol/L (ref 98–111)
CO2: 22 mmol/L (ref 22–32)
Calcium: 9.1 mg/dL (ref 8.9–10.3)
Creatinine: 0.8 mg/dL (ref 0.61–1.24)
GFR, Est AFR Am: >60 mL/min (ref >60)
GFR, Estimated: >60 mL/min (ref >60)
Glucose, Bld: 122 mg/dL — ABNORMAL HIGH (ref 70–99)
POTASSIUM: 4.2 mmol/L (ref 3.5–5.1)
Sodium: 139 mmol/L (ref 135–145)
TOTAL PROTEIN: 6.6 g/dL (ref 6.5–8.1)

## 2018-01-31 LAB — CBC (CANCER CENTER ONLY)
HEMATOCRIT: 36 % — AB (ref 38.4–49.9)
Hemoglobin: 11.7 g/dL — ABNORMAL LOW (ref 13.0–17.1)
MCH: 32.8 pg (ref 27.2–33.4)
MCHC: 32.5 g/dL (ref 32.0–36.0)
MCV: 100.8 fL — ABNORMAL HIGH (ref 79.3–98.0)
PLATELETS: 208 10*3/uL (ref 140–400)
RBC: 3.57 MIL/uL — ABNORMAL LOW (ref 4.20–5.82)
RDW: 16.5 % — AB (ref 11.0–14.6)
WBC: 9.6 10*3/uL (ref 4.0–10.3)

## 2018-01-31 LAB — URINALYSIS, ROUTINE W REFLEX MICROSCOPIC
Bilirubin Urine: NEGATIVE
Bilirubin Urine: NEGATIVE
GLUCOSE, UA: NEGATIVE mg/dL
GLUCOSE, UA: NEGATIVE mg/dL
HGB URINE DIPSTICK: NEGATIVE
Hgb urine dipstick: NEGATIVE
KETONES UR: NEGATIVE mg/dL
Ketones, ur: NEGATIVE mg/dL
LEUKOCYTES UA: NEGATIVE
Leukocytes, UA: NEGATIVE
Nitrite: NEGATIVE
Nitrite: POSITIVE — AB
Protein, ur: 30 mg/dL — AB
Protein, ur: NEGATIVE mg/dL
SPECIFIC GRAVITY, URINE: 1.01 (ref 1.005–1.030)
Specific Gravity, Urine: 1.023 (ref 1.005–1.030)
pH: 6 (ref 5.0–8.0)
pH: 5 (ref 5.0–8.0)

## 2018-01-31 LAB — URINALYSIS, DIPSTICK ONLY
BILIRUBIN URINE: NEGATIVE
GLUCOSE, UA: NEGATIVE mg/dL
HGB URINE DIPSTICK: NEGATIVE
Ketones, ur: NEGATIVE mg/dL
Leukocytes, UA: NEGATIVE
Nitrite: NEGATIVE
PROTEIN: 30 mg/dL — AB
Specific Gravity, Urine: 1.018 (ref 1.005–1.030)
pH: 7 (ref 5.0–8.0)

## 2018-01-31 LAB — GLUCOSE, CAPILLARY
Glucose-Capillary: 143 mg/dL — ABNORMAL HIGH (ref 70–99)
Glucose-Capillary: 201 mg/dL — ABNORMAL HIGH (ref 70–99)

## 2018-01-31 MED ORDER — STERILE WATER FOR INJECTION IV SOLN
INTRAVENOUS | Status: DC
  Administered 2018-01-31 – 2018-02-03 (×6): via INTRAVENOUS
  Filled 2018-01-31 (×13): qty 9.71

## 2018-01-31 MED ORDER — SODIUM CHLORIDE 0.9 % IV SOLN
INTRAVENOUS | Status: DC | PRN
  Administered 2018-01-31: 250 mL via INTRAVENOUS

## 2018-01-31 MED ORDER — INSULIN ASPART 100 UNIT/ML ~~LOC~~ SOLN
0.0000 [IU] | Freq: Three times a day (TID) | SUBCUTANEOUS | Status: DC
  Administered 2018-01-31: 1 [IU] via SUBCUTANEOUS
  Administered 2018-02-01 – 2018-02-02 (×3): 2 [IU] via SUBCUTANEOUS
  Administered 2018-02-02: 1 [IU] via SUBCUTANEOUS

## 2018-01-31 MED ORDER — MOMETASONE FURO-FORMOTEROL FUM 200-5 MCG/ACT IN AERO
2.0000 | INHALATION_SPRAY | Freq: Two times a day (BID) | RESPIRATORY_TRACT | Status: DC
  Administered 2018-01-31 – 2018-02-01 (×3): 2 via RESPIRATORY_TRACT
  Filled 2018-01-31: qty 8.8

## 2018-01-31 MED ORDER — INSULIN ASPART 100 UNIT/ML ~~LOC~~ SOLN
0.0000 [IU] | Freq: Every day | SUBCUTANEOUS | Status: DC
  Administered 2018-01-31: 2 [IU] via SUBCUTANEOUS
  Administered 2018-02-02: 0 [IU] via SUBCUTANEOUS

## 2018-01-31 MED ORDER — ENOXAPARIN SODIUM 40 MG/0.4ML ~~LOC~~ SOLN
40.0000 mg | SUBCUTANEOUS | Status: DC
  Administered 2018-01-31 – 2018-02-02 (×3): 40 mg via SUBCUTANEOUS
  Filled 2018-01-31 (×2): qty 0.4

## 2018-01-31 MED ORDER — ATENOLOL 50 MG PO TABS
50.0000 mg | ORAL_TABLET | Freq: Every day | ORAL | Status: DC
  Administered 2018-02-01 – 2018-02-03 (×3): 50 mg via ORAL
  Filled 2018-01-31 (×3): qty 1

## 2018-01-31 MED ORDER — ALBUTEROL SULFATE (2.5 MG/3ML) 0.083% IN NEBU: 2.5000 mg | INHALATION_SOLUTION | RESPIRATORY_TRACT | Status: DC | PRN

## 2018-01-31 MED ORDER — IRBESARTAN 75 MG PO TABS
75.0000 mg | ORAL_TABLET | Freq: Every day | ORAL | Status: DC
  Administered 2018-02-01: 75 mg via ORAL
  Filled 2018-01-31: qty 1

## 2018-01-31 MED ORDER — INSULIN GLARGINE 100 UNIT/ML ~~LOC~~ SOLN
10.0000 [IU] | Freq: Every day | SUBCUTANEOUS | Status: DC
  Administered 2018-01-31 – 2018-02-02 (×3): 10 [IU] via SUBCUTANEOUS
  Filled 2018-01-31 (×4): qty 0.1

## 2018-01-31 MED ORDER — ACETAMINOPHEN 500 MG PO TABS: 1000.0000 mg | ORAL_TABLET | Freq: Three times a day (TID) | ORAL | Status: DC | PRN

## 2018-01-31 MED ORDER — SODIUM CHLORIDE 0.9 % IV SOLN
Freq: Once | INTRAVENOUS | Status: AC
  Administered 2018-01-31: 16 mg via INTRAVENOUS
  Filled 2018-01-31: qty 8

## 2018-01-31 MED ORDER — LEVETIRACETAM 500 MG PO TABS
500.0000 mg | ORAL_TABLET | Freq: Two times a day (BID) | ORAL | Status: DC
  Administered 2018-01-31 – 2018-02-03 (×6): 500 mg via ORAL
  Filled 2018-01-31 (×6): qty 1

## 2018-01-31 MED ORDER — SODIUM CHLORIDE 0.9 % IV SOLN
3.5000 g/m2 | Freq: Once | INTRAVENOUS | Status: AC
  Administered 2018-01-31: 8.61 g via INTRAVENOUS
  Filled 2018-01-31: qty 344.4

## 2018-01-31 NOTE — Progress Notes (Signed)
Methotrexate dose and dilution verified with Nancy Marus, RN.

## 2018-01-31 NOTE — Progress Notes (Signed)
[facility] [place],  [address] [phone]   Pre-Admission Evaluation  Date of Service: 01/31/18 Patient Name: Tim [name] Patient MRN: [id number] Patient DOB: [birth year] Provider: Ventura Sellers, MD  Identifying Statement:  Tim [name] is a 62 y.o. male with left frontal CNS lymphoma  Interval History:  Tim [name] presents today for admission and initiation of cycle #3 of high dose methotrexate and rituximab for CNS lymphoma.  He was able to successfully wean off of decadron this past week.  Otherwise no seizures, headaches, new neurologic deficits.  No issues with port.  Medications: Current Outpatient Medications on File Prior to Visit  Medication Sig Dispense Refill  . acetaminophen (TYLENOL) 500 MG tablet Take 1,000 mg by mouth every 8 (eight) hours as needed (pain, fever, headache).     Marland Kitchen acetaZOLAMIDE (DIAMOX) 250 MG tablet Take 2 tablets (500 mg total) by mouth 2 (two) times daily for 3 days. 12 tablet 0  . albuterol (PROVENTIL HFA;VENTOLIN HFA) 108 (90 Base) MCG/ACT inhaler Inhale 2 puffs into the lungs every 4 (four) hours as needed for wheezing or shortness of breath. (Patient not taking: Reported on 01/03/2018) 1 Inhaler 0  . allopurinol (ZYLOPRIM) 300 MG tablet Take 300 mg by mouth daily.    Marland Kitchen atenolol (TENORMIN) 100 MG tablet Take 50 mg by mouth daily.    Marland Kitchen b complex vitamins tablet Take 1 tablet by mouth daily.    . blood glucose meter kit and supplies KIT Dispense based on patient and insurance preference. Use up to four times daily as directed. (FOR ICD-9 250.00, 250.01). 1 each 0  . budesonide-formoterol (SYMBICORT) 160-4.5 MCG/ACT inhaler Take 2 puffs first thing in am and then another 2 puffs about 12 hours later. 1 Inhaler 12  . dexamethasone (DECADRON) 2 MG tablet Take 1 tablet (2 mg total) by mouth daily. 30 tablet 0  . Insulin Isophane & Regular Human (NOVOLIN 70/30  FLEXPEN) (70-30) 100 UNIT/ML PEN Inject 15 Units into the skin 2 (two) times daily. 15 mL 11  . Insulin Pen Needle (PEN NEEDLES 3/16") 31G X 5 MM MISC 1 Container by Does not apply route 2 (two) times daily. 100 each 0  . levETIRAcetam (KEPPRA) 500 MG tablet Take 1 tablet (500 mg total) by mouth 2 (two) times daily. 60 tablet 5  . metFORMIN (GLUCOPHAGE) 500 MG tablet Take 1 tablet (500 mg total) by mouth 2 (two) times daily with a meal. 60 tablet 3  . Multiple Vitamin (MULTIVITAMIN) tablet Take 1 tablet by mouth daily.    . pantoprazole (PROTONIX) 40 MG tablet Take 1 tablet by mouth daily. Take while on Decadron    . valsartan (DIOVAN) 160 MG tablet Take 1 tablet (160 mg total) by mouth daily. 30 tablet 11   No current facility-administered medications on file prior to visit.     Allergies: No Known Allergies   Past Medical History:  Past Medical History:  Diagnosis Date  . Arthritis   . Brain tumor ([place])   . COPD (chronic obstructive pulmonary disease) ([place])   . Diabetes mellitus without complication [facility])    patient states he was taken off meds at last appointment with PCP and is diet controlled  . Dyspnea   . Gout   . Hypertension 08/02/2017  . Pneumonia    Past Surgical History: none    Social History:  Social History   Socioeconomic History  . Marital  status: Significant Other    Spouse name: [name]  . Number of children: Not on file  . Years of education: 73  . Highest education level: Bachelor's degree (e.g., BA, AB, BS)  Occupational History  . 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 Financial resource strain: Not hard at all  . Food insecurity:    Worry: Never true    Inability: Never true  . Transportation needs:    Medical: No    Non-medical: No  Tobacco Use  . Smoking status: Former Smoker    Packs/day: 2.00    Years: 40.00    Pack years: 80.00    Types: Cigarettes    Last attempt to quit: 08/10/2011    Years since  quitting: 6.4  . Smokeless tobacco: Never Used  Substance and Sexual Activity  . Alcohol use: Yes    Alcohol/week: 14.0 standard drinks    Types: 14 Cans of beer per week    Frequency: Never  . Drug use: Never  . Sexual activity: Not Currently  Lifestyle  . Physical activity:    Days per week: 0 days    Minutes per session: 0 min  . Stress: Only a little  Relationships  . Social connections:    Talks on phone: Once a week    Gets together: Once a week    Attends religious service: Never    Active member of club or organization: No    Attends meetings of clubs or organizations: Never    Relationship status: Living with partner  . Intimate partner violence:    Fear of current or ex partner: No    Emotionally abused: No    Physically abused: No    Forced sexual activity: No  Other Topics Concern  . Not on file  Social History Narrative  . Not on file   Family History:  Family History  Problem Relation Age of Onset  . Cancer Brother     Review of Systems: Constitutional: Denies fevers, chills or abnormal weight loss Eyes: Denies blurriness of vision Ears, nose, mouth, throat, and face: Denies mucositis or sore throat Respiratory: chronic hoarseness Cardiovascular: Denies palpitation, chest discomfort or lower extremity swelling Gastrointestinal:  Denies nausea, constipation, diarrhea GU: Denies dysuria or incontinence Skin: Denies abnormal skin rashes Neurological: Per HPI Musculoskeletal: Denies joint pain, back or neck discomfort. No decrease in ROM Behavioral/Psych: Denies anxiety, disturbance in thought content, and mood instability  Physical Exam: Vitals:   01/31/18 1237  BP: 111/78  Pulse: 62  Resp: 18  Temp: 97.6 F (36.4 C)  SpO2: 95%   KPS: 80. General: Alert, cooperative, pleasant, in no acute distress Head: Biopsy scar noted, dry and intact. EENT: No conjunctival injection or scleral icterus. Oral mucosa moist Lungs: Resp effort normal Cardiac:  Regular rate and rhythm Abdomen: Soft, non-distended abdomen Skin: No rashes cyanosis or petechiae. Extremities: No clubbing or edema  Neurologic Exam: Mental Status: Awake, alert, attentive to examiner. Oriented to self and environment. Language fluent with intact comprehension, repetition, reading. Cranial Nerves: Visual acuity is grossly normal. Visual fields are full. Extra-ocular movements intact. No ptosis. Face is symmetric, tongue midline. Motor: Tone and bulk are normal. Power 4+/5 in right arm and leg, subtle hip girdle weakness. Reflexes are symmetric, no pathologic reflexes present. Intact finger to nose bilaterally Sensory: Intact to light touch and temperature Gait: Normal, independent   Labs: I have reviewed the data as listed  Component Value Date/Time   NA 139 01/07/2018 0348   K 3.9 01/07/2018 0348   CL 105 01/07/2018 0348   CO2 27 01/07/2018 0348   GLUCOSE 288 (H) 01/07/2018 0348   BUN 22 01/07/2018 0348   CREATININE 0.65 01/07/2018 0348   CREATININE 0.95 01/03/2018 1237   CALCIUM 8.5 (L) 01/07/2018 0348   PROT 5.4 (L) 01/07/2018 0348   ALBUMIN 3.0 (L) 01/07/2018 0348   AST 46 (H) 01/07/2018 0348   AST 23 01/03/2018 1237   ALT 68 (H) 01/07/2018 0348   ALT 44 01/03/2018 1237   ALKPHOS 59 01/07/2018 0348   BILITOT 0.5 01/07/2018 0348   BILITOT 0.4 01/03/2018 1237   GFRNONAA >60 01/07/2018 0348   GFRNONAA >60 01/03/2018 1237   GFRAA >60 01/07/2018 0348   GFRAA >60 01/03/2018 1237   Lab Results  Component Value Date   WBC 9.1 01/07/2018   NEUTROABS 8.1 (H) 01/07/2018   HGB 11.6 (L) 01/07/2018   HCT 34.0 (L) 01/07/2018   MCV 94.7 01/07/2018   PLT 123 (L) 01/07/2018     Imaging:  CLINICAL DATA:  Initial treatment strategy for CNS lymphoma.  EXAM: NUCLEAR MEDICINE PET SKULL BASE TO THIGH  TECHNIQUE: 13.5 mCi F-18 FDG was injected intravenously. Full-ring PET imaging was performed from the skull base to thigh after the radiotracer. CT data  was obtained and used for attenuation correction and anatomic localization.  Fasting blood glucose: 141 mg/dl  COMPARISON:  CT scans 08/04/2017 and MRI brain 08/16/2017.  FINDINGS: Mediastinal blood pool activity: SUV max 2.85  NECK: No hypermetabolic lymph nodes in the neck.  Incidental CT findings: none  CHEST: Numerous ill-defined hazy pulmonary lesions are hypermetabolic. Findings most consistent with some type of inflammatory or infectious lung disease or possibly drug reaction. There also hypermetabolic hilar and mediastinal nodes. These are not enlarged however and again I think these are probably reactive. This does not look like lymphoma in the lungs and the chest CT from 08/04/2017 was relatively normal.  Incidental CT findings: none  ABDOMEN/PELVIS: No abnormal hypermetabolic activity within the liver, pancreas, adrenal glands, or spleen. No hypermetabolic lymph nodes in the abdomen or pelvis.  Incidental CT findings: The liver and spleen are normal in size.  SKELETON: No focal hypermetabolic activity to suggest skeletal metastasis.  Incidental CT findings: none  IMPRESSION: 1. Numerous pulmonary lesions which are hypermetabolic along with hypermetabolic small mediastinal and hilar nodes. Findings most likely due to an inflammatory or infectious pulmonary process or possible drug reaction. Pulmonary lymphoma is highly unlikely. Recommend correlation with clinical findings and appropriate treatment. A short-term follow-up chest CT may be helpful (2-3 months). 2. No findings for lymphoma Nissen vomit of the abdomen/pelvis or skeletal structures. No axillary or inguinal adenopathy.   Electronically Signed   By: Marijo Sanes M.D.   On: 09/10/2017 17:09  [facility] Clinician Interpretation: I have personally reviewed the MRI brain with and without contrast from outside institution dated 11/09/17.  My interpretation, in the context of the patient's  clinical presentation, is progressive disease  Pathology     Assessment/Plan  Primary CNS lymphoma [facility])   Tim [name] is clinically stable today and ready for initiation of cycle #3 HD-MTX and rituximab.    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   -appropriate to proceed with Cycle 3 of high dose MTX with leucovorin rescue -MTX-R therapy C3D1today:  Methotrexate 3.5g/m2 given over 4 hours once urine pH at or above 7.0 -Leucovorin 51m q6 hours starting 24 hours after MTX infusion   -Rituxan can be given 24-48 hours after MTX infusion -To start ASAP: IV fluids, 1/4NS with 1056m sodium bicarbonate at 12534mr.  Will adjust rate until urine pH at or above 7.0, con't to titrate to 8.0. -daily labs with cbc, cmp -4x daily urine pH once it reaches target pH of 7 -daily MTX levels until MTX levels <0.10 -continue same supportive medications  -please to do not administer proton pump inhibitors during MTX admissions  2) Focal seizures -Con't 500m35mD Keppra  3) Reported sleep apnea/hypopnea -Uses non-pressurized oxygen 3L nocturnal.  Doesn't require O2 even with exertion during the day.   4) Diabetes -Insulin sliding scale -uses 70/30 15 units BID at home  We appreciate the opportunity to participate in the care of ChriRoyal HawthornWill continue to follow daily during this admission.  Direct contact # is [phone] The total time spent in the encounter was 40 minutes and more than 50% was on counseling and review of test results   ZachVentura Sellers Medical Director of Neuro-Oncology [facility]23/19 12:31 PM

## 2018-01-31 NOTE — Progress Notes (Signed)
Called 6E to give report to floor nurse Beverlee Nims.  Patient being admitted to Room 1613 after clearance from Dr Mickeal Skinner today

## 2018-01-31 NOTE — Patient Instructions (Signed)
Implanted Port Home Guide An implanted port is a type of central line that is placed under the skin. Central lines are used to provide IV access when treatment or nutrition needs to be given through a person's veins. Implanted ports are used for long-term IV access. An implanted port may be placed because:  You need IV medicine that would be irritating to the small veins in your hands or arms.  You need long-term IV medicines, such as antibiotics.  You need IV nutrition for a long period.  You need frequent blood draws for lab tests.  You need dialysis.  Implanted ports are usually placed in the chest area, but they can also be placed in the upper arm, the abdomen, or the leg. An implanted port has two main parts:  Reservoir. The reservoir is round and will appear as a small, raised area under your skin. The reservoir is the part where a needle is inserted to give medicines or draw blood.  Catheter. The catheter is a thin, flexible tube that extends from the reservoir. The catheter is placed into a large vein. Medicine that is inserted into the reservoir goes into the catheter and then into the vein.  How will I care for my incision site? Do not get the incision site wet. Bathe or shower as directed by your health care provider. How is my port accessed? Special steps must be taken to access the port:  Before the port is accessed, a numbing cream can be placed on the skin. This helps numb the skin over the port site.  Your health care provider uses a sterile technique to access the port. ? Your health care provider must put on a mask and sterile gloves. ? The skin over your port is cleaned carefully with an antiseptic and allowed to dry. ? The port is gently pinched between sterile gloves, and a needle is inserted into the port.  Only "non-coring" port needles should be used to access the port. Once the port is accessed, a blood return should be checked. This helps ensure that the port  is in the vein and is not clogged.  If your port needs to remain accessed for a constant infusion, a clear (transparent) bandage will be placed over the needle site. The bandage and needle will need to be changed every week, or as directed by your health care provider.  Keep the bandage covering the needle clean and dry. Do not get it wet. Follow your health care provider's instructions on how to take a shower or bath while the port is accessed.  If your port does not need to stay accessed, no bandage is needed over the port.  What is flushing? Flushing helps keep the port from getting clogged. Follow your health care provider's instructions on how and when to flush the port. Ports are usually flushed with saline solution or a medicine called heparin. The need for flushing will depend on how the port is used.  If the port is used for intermittent medicines or blood draws, the port will need to be flushed: ? After medicines have been given. ? After blood has been drawn. ? As part of routine maintenance.  If a constant infusion is running, the port may not need to be flushed.  How long will my port stay implanted? The port can stay in for as long as your health care provider thinks it is needed. When it is time for the port to come out, surgery will be   done to remove it. The procedure is similar to the one performed when the port was put in. When should I seek immediate medical care? When you have an implanted port, you should seek immediate medical care if:  You notice a bad smell coming from the incision site.  You have swelling, redness, or drainage at the incision site.  You have more swelling or pain at the port site or the surrounding area.  You have a fever that is not controlled with medicine.  This information is not intended to replace advice given to you by your health care provider. Make sure you discuss any questions you have with your health care provider. Document  Released: 04/27/2005 Document Revised: 10/03/2015 Document Reviewed: 01/02/2013 [employer] Interactive Patient Education  2017 [employer]  

## 2018-01-31 NOTE — H&P (Signed)
History and Physical    Tim [name]:[id number] DOB: [birth year] DOA: 9/23/2019e I have briefly reviewed the patient's prior medical records in [place]  PCP: Leonard Downing, MD  Patient coming from: home  Chief Complaint: CNS lymphoma  HPI: Tim [name] is a 62 y.o. male with medical history significant of COPD, DM, HTN, primary CNS lymphoma who is being admitted from Dr. Renda Rolls office to start the third round of methotrexate and rituximab in the hospital.  Currently he has no complaints other than weakness.  Specifically, denies any chest pain, denies any shortness of breath, no abdominal pain, nausea or vomiting.  No recent fever or chills.  No flulike illness.  Review of Systems: As per HPI otherwise 10 point review of systems negative.   Past Medical History:  Diagnosis Date  . Arthritis   . Brain tumor ([place])   . COPD (chronic obstructive pulmonary disease) ([place])   . Diabetes mellitus without complication [facility])    patient states he was taken off meds at last appointment with PCP and is diet controlled  . Dyspnea   . Gout   . Hypertension 08/02/2017  . Pneumonia     Past Surgical History:  Procedure Laterality Date  . APPLICATION OF CRANIAL NAVIGATION Left 08/30/2017   Procedure: APPLICATION OF CRANIAL NAVIGATION;  Surgeon: Erline Levine, MD;  Location: [place];  Service: Neurosurgery;  Laterality: Left;  . IR IMAGING GUIDED PORT INSERTION  12/27/2017  . STERIOTACTIC STIMULATOR INSERTION Left 08/30/2017   Procedure: LEFT Frontal Sterotactic Brain Biopsy with BrainLab;  Surgeon: Erline Levine, MD;  Location: [place];  Service: Neurosurgery;  Laterality: Left;     reports that he quit smoking about 6 years ago. His smoking use included cigarettes. He has a 80.00 pack-year smoking history. He has never used smokeless tobacco. He reports that he drinks about 14.0 standard drinks of alcohol per week. He reports that he does not use drugs.  No Known  Allergies  Family History  Problem Relation Age of Onset  . Cancer Brother     Prior to Admission medications   Medication Sig Start Date End Date Taking? Authorizing Provider  acetaminophen (TYLENOL) 500 MG tablet Take 1,000 mg by mouth every 8 (eight) hours as needed (pain, fever, headache).     [provider]  acetaZOLAMIDE (DIAMOX) 250 MG tablet Take 2 tablets (500 mg total) by mouth 2 (two) times daily for 3 days. 01/29/18 02/01/18  Ventura Sellers, MD  albuterol (PROVENTIL HFA;VENTOLIN HFA) 108 (90 Base) MCG/ACT inhaler Inhale 2 puffs into the lungs every 4 (four) hours as needed for wheezing or shortness of breath. Patient not taking: Reported on 01/03/2018 12/13/17   Raiford Noble Latif, DO  allopurinol (ZYLOPRIM) 300 MG tablet Take 300 mg by mouth daily.    [provider]  atenolol (TENORMIN) 100 MG tablet Take 50 mg by mouth daily.    [provider]  b complex vitamins tablet Take 1 tablet by mouth daily.    [provider]  blood glucose meter kit and supplies KIT Dispense based on patient and insurance preference. Use up to four times daily as directed. (FOR ICD-9 250.00, 250.01). 12/13/17   Raiford Noble Latif, DO  budesonide-formoterol [facility]) 160-4.5 MCG/ACT inhaler Take 2 puffs first thing in am and then another 2 puffs about 12 hours later. 09/24/17   Tanda Rockers, MD  dexamethasone (DECADRON) 2 MG tablet Take 1 tablet (2 mg total) by mouth daily.  01/08/18   Raiford Noble Latif, DO  Insulin Isophane & Regular Human (NOVOLIN 70/30 FLEXPEN) (70-30) 100 UNIT/ML PEN Inject 15 Units into the skin 2 (two) times daily. 01/07/18   Raiford Noble Latif, DO  Insulin Pen Needle (PEN NEEDLES 3/16") 31G X 5 MM MISC 1 Container by Does not apply route 2 (two) times daily. 01/07/18   Sheikh, [name], DO  levETIRAcetam (KEPPRA) 500 MG tablet Take 1 tablet (500 mg total) by mouth 2 (two) times daily. 11/18/17   Ventura Sellers, MD  metFORMIN (GLUCOPHAGE)  500 MG tablet Take 1 tablet (500 mg total) by mouth 2 (two) times daily with a meal. 12/14/17   Vaslow, [name], MD  Multiple Vitamin (MULTIVITAMIN) tablet Take 1 tablet by mouth daily.    [provider]  pantoprazole (PROTONIX) 40 MG tablet Take 1 tablet by mouth daily. Take while on Decadron 11/10/17 11/10/18  [provider]  valsartan (DIOVAN) 160 MG tablet Take 1 tablet (160 mg total) by mouth daily. 08/24/17   Tanda Rockers, MD    Physical Exam: There were no vitals filed for this visit.    Constitutional: NAD, calm, comfortable Eyes: lids and conjunctivae normal ENMT: Mucous membranes are moist.  Neck: normal, supple Respiratory: clear to auscultation bilaterally, no wheezing, no crackles. Normal respiratory effort. No accessory muscle use.  Cardiovascular: Regular rate and rhythm, no murmurs / rubs / gallops.  Trace lower extremity edema. Abdomen: no tenderness, no masses palpated. Bowel sounds positive.  Musculoskeletal: no clubbing / cyanosis. Normal muscle tone.  Skin: no rashes Neurologic: CN 2-12 grossly intact. Strength 5/5 in all 4.  Psychiatric: Normal judgment and insight. Alert and oriented x 3. Normal mood.   Labs on Admission: I have personally reviewed following labs and imaging studies  CBC: Recent Labs  Lab 01/31/18 1221  WBC 9.6  HGB 11.7*  HCT 36.0*  MCV 100.8*  PLT 008   Basic Metabolic Panel: Recent Labs  Lab 01/31/18 1221  NA 139  K 4.2  CL 109  CO2 22  GLUCOSE 122*  BUN 18  CREATININE 0.80  CALCIUM 9.1   GFR: Estimated Creatinine Clearance: 131.3 mL/min (by C-G formula based on SCr of 0.8 mg/dL). Liver Function Tests: Recent Labs  Lab 01/31/18 1221  AST 16  ALT 22  ALKPHOS 108  BILITOT 0.8  PROT 6.6  ALBUMIN 3.5   No results for input(s): LIPASE, AMYLASE in the last 168 hours. No results for input(s): AMMONIA in the last 168 hours. Coagulation Profile: No results for input(s): INR, PROTIME in the last 168  hours. Cardiac Enzymes: No results for input(s): CKTOTAL, CKMB, CKMBINDEX, TROPONINI in the last 168 hours. BNP (last 3 results) Recent Labs    09/15/17 1730  PROBNP 49.0   HbA1C: No results for input(s): HGBA1C in the last 72 hours. CBG: No results for input(s): GLUCAP in the last 168 hours. Lipid Profile: No results for input(s): CHOL, HDL, LDLCALC, TRIG, CHOLHDL, LDLDIRECT in the last 72 hours. Thyroid Function Tests: No results for input(s): TSH, T4TOTAL, FREET4, T3FREE, THYROIDAB in the last 72 hours. Anemia Panel: No results for input(s): VITAMINB12, FOLATE, FERRITIN, TIBC, IRON, RETICCTPCT in the last 72 hours. Urine analysis:    Component Value Date/Time   COLORURINE AMBER (A) 01/31/2018 1150   APPEARANCEUR CLEAR 01/31/2018 1150   LABSPEC 1.018 01/31/2018 1150   PHURINE 7.0 01/31/2018 1150   GLUCOSEU NEGATIVE 01/31/2018 [place] 01/31/2018 1150   BILIRUBINUR NEGATIVE  01/31/2018 1150   KETONESUR NEGATIVE 01/31/2018 1150   PROTEINUR 30 (A) 01/31/2018 1150   UROBILINOGEN 0.2 03/28/2007 1459   NITRITE NEGATIVE 01/31/2018 [place] 01/31/2018 1150     Radiological Exams on Admission: No results found.  Assessment/Plan Active Problems:   Essential hypertension   COPD  GOLD III    DM (diabetes mellitus), type 2 (HCC)   CNS lymphoma (HCC)    Primary CNS lymphoma -Management per oncology, discussed with Dr. Mickeal Skinner, to start 1/4 NS with bicarbonate, as well as methotrexate since urinary pH is 7 when checked in clinic prior to coming to the hospital -Continue urinary pH every 6 hours  Hypertension -Continue home medications  COPD Gold III -Continue home medications, no wheezing  Type 2 diabetes mellitus -Placed on Lantus while hospitalized and sliding scale   DVT prophylaxis: Lovenox  Code Status: Full code  Family Communication: wife bedside Disposition Plan: home when ready  Consults called: neuro-onc     Admission  status: inpatient    At the time of admission, it appears that the appropriate admission status for this patient is INPATIENT. This is judged to be reasonable and necessary in order to provide the required high service intensity to ensure the patient's safety given the presenting symptoms, physical exam findings, and initial radiographic and laboratory data in the context of their chronic comorbidities. Current circumstances are chemotherapy, and it is felt to place patient at high risk for further clinical deterioration threatening life, limb, or organ. Moreover, it is my clinical judgment that the patient will require inpatient hospital care spanning beyond 2 midnights from the point of admission and that early discharge would result in unnecessary risk of decompensation and readmission or threat to life, limb or bodily function.   Marzetta Board, MD [facility] Pager [phone]  If 7PM-7AM, please contact night-coverage [email or web address] Password [email or web address]  01/31/2018, 2:01 PM

## 2018-02-01 ENCOUNTER — Telehealth: Payer: Self-pay

## 2018-02-01 ENCOUNTER — Telehealth: Payer: Self-pay | Admitting: Nurse Practitioner

## 2018-02-01 ENCOUNTER — Telehealth: Payer: Self-pay | Admitting: Internal Medicine

## 2018-02-01 DIAGNOSIS — Z87891 Personal history of nicotine dependence: Secondary | ICD-10-CM

## 2018-02-01 DIAGNOSIS — C8331 Diffuse large B-cell lymphoma, lymph nodes of head, face, and neck: Secondary | ICD-10-CM

## 2018-02-01 DIAGNOSIS — Z79899 Other long term (current) drug therapy: Secondary | ICD-10-CM

## 2018-02-01 DIAGNOSIS — G40109 Localization-related (focal) (partial) symptomatic epilepsy and epileptic syndromes with simple partial seizures, not intractable, without status epilepticus: Secondary | ICD-10-CM

## 2018-02-01 DIAGNOSIS — G4730 Sleep apnea, unspecified: Secondary | ICD-10-CM

## 2018-02-01 DIAGNOSIS — E119 Type 2 diabetes mellitus without complications: Secondary | ICD-10-CM

## 2018-02-01 DIAGNOSIS — Z9981 Dependence on supplemental oxygen: Secondary | ICD-10-CM

## 2018-02-01 DIAGNOSIS — Z794 Long term (current) use of insulin: Secondary | ICD-10-CM

## 2018-02-01 LAB — CBC
HEMATOCRIT: 32.4 % — AB (ref 39.0–52.0)
Hemoglobin: 11 g/dL — ABNORMAL LOW (ref 13.0–17.0)
MCH: 33.1 pg (ref 26.0–34.0)
MCHC: 34 g/dL (ref 30.0–36.0)
MCV: 97.6 fL (ref 78.0–100.0)
Platelets: 189 10*3/uL (ref 150–400)
RBC: 3.32 MIL/uL — ABNORMAL LOW (ref 4.22–5.81)
RDW: 15.7 % — ABNORMAL HIGH (ref 11.5–15.5)
WBC: 9.2 10*3/uL (ref 4.0–10.5)

## 2018-02-01 LAB — URINALYSIS, ROUTINE W REFLEX MICROSCOPIC
BILIRUBIN URINE: NEGATIVE
BILIRUBIN URINE: NEGATIVE
Bilirubin Urine: NEGATIVE
Bilirubin Urine: NEGATIVE
GLUCOSE, UA: NEGATIVE mg/dL
GLUCOSE, UA: NEGATIVE mg/dL
GLUCOSE, UA: 150 mg/dL — AB
GLUCOSE, UA: NEGATIVE mg/dL
Hgb urine dipstick: NEGATIVE
Hgb urine dipstick: NEGATIVE
Hgb urine dipstick: NEGATIVE
Hgb urine dipstick: NEGATIVE
Ketones, ur: NEGATIVE mg/dL
Ketones, ur: NEGATIVE mg/dL
Ketones, ur: NEGATIVE mg/dL
Ketones, ur: NEGATIVE mg/dL
LEUKOCYTES UA: NEGATIVE
LEUKOCYTES UA: NEGATIVE
Leukocytes, UA: NEGATIVE
Leukocytes, UA: NEGATIVE
NITRITE: NEGATIVE
NITRITE: NEGATIVE
NITRITE: NEGATIVE
NITRITE: NEGATIVE
PH: 6 (ref 5.0–8.0)
PH: 7 (ref 5.0–8.0)
PH: 8 (ref 5.0–8.0)
PH: 8 (ref 5.0–8.0)
Protein, ur: NEGATIVE mg/dL
Protein, ur: NEGATIVE mg/dL
Protein, ur: NEGATIVE mg/dL
Protein, ur: NEGATIVE mg/dL
SPECIFIC GRAVITY, URINE: 1.015 (ref 1.005–1.030)
SPECIFIC GRAVITY, URINE: 1.016 (ref 1.005–1.030)
SPECIFIC GRAVITY, URINE: 1.005 (ref 1.005–1.030)
Specific Gravity, Urine: 1.006 (ref 1.005–1.030)

## 2018-02-01 LAB — GLUCOSE, CAPILLARY
Glucose-Capillary: 170 mg/dL — ABNORMAL HIGH (ref 70–99)
Glucose-Capillary: 192 mg/dL — ABNORMAL HIGH (ref 70–99)

## 2018-02-01 LAB — BASIC METABOLIC PANEL
Anion gap: 9 (ref 5–15)
BUN: 23 mg/dL (ref 8–23)
CO2: 23 mmol/L (ref 22–32)
Calcium: 8.7 mg/dL — ABNORMAL LOW (ref 8.9–10.3)
Chloride: 109 mmol/L (ref 98–111)
Creatinine, Ser: 0.88 mg/dL (ref 0.61–1.24)
GFR calc non Af Amer: >60 mL/min (ref >60)
GLUCOSE: 180 mg/dL — AB (ref 70–99)
POTASSIUM: 3.7 mmol/L (ref 3.5–5.1)
Sodium: 141 mmol/L (ref 135–145)

## 2018-02-01 LAB — METHOTREXATE: Methotrexate: 0.07

## 2018-02-01 MED ORDER — ACETAZOLAMIDE ER 500 MG PO CP12
500.0000 mg | ORAL_CAPSULE | Freq: Two times a day (BID) | ORAL | Status: DC
  Administered 2018-02-01 – 2018-02-03 (×5): 500 mg via ORAL
  Filled 2018-02-01 (×6): qty 1

## 2018-02-01 MED ORDER — SODIUM CHLORIDE 0.9 % IV SOLN
Freq: Once | INTRAVENOUS | Status: DC
  Filled 2018-02-01: qty 8

## 2018-02-01 MED ORDER — LEUCOVORIN CALCIUM INJECTION 100 MG
15.0000 mg | Freq: Four times a day (QID) | INTRAMUSCULAR | Status: AC
  Administered 2018-02-01 – 2018-02-02 (×4): 16 mg via INTRAVENOUS
  Filled 2018-02-01 (×7): qty 0.8

## 2018-02-01 NOTE — Telephone Encounter (Signed)
Error

## 2018-02-01 NOTE — Telephone Encounter (Signed)
Per 9/23 no los. Spoke with RN Maudie Mercury 9/24) and she said that she will speak with Vaslow concerning return visit

## 2018-02-01 NOTE — Progress Notes (Signed)
PROGRESS NOTE  Tim [name] TKZ:[id number] DOB: 1955/08/08 DOA: 01/31/2018 PCP: Leonard Downing, MD  HPI/Recap of past 24 hours: HPI from Dr Pervis Hocking is a 62 y.o. male with medical history significant of COPD, DM, HTN, primary CNS lymphoma who is being admitted from Dr. Renda Rolls office to start the third round of chemo (methotrexate and rituximab) in the hospital. Pt had no complaints other than weakness. Pt admitted for further management and chemo administration.   Today, pt denies any new complaints, no chest pain, SOB, abdominal pain, N/V/D, fever/chills.  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 Mickeal Skinner Continue 1/4 NS with bicarbonate @125cc /hr. Dr Mickeal Skinner will adjust rate based on urinary PH Continue urinary pH every 6 hours Continue MTX, leucovorin, Rituxan  Daily CBC, CMP, methotrexate level   Focal seizures Continue Keppra  Hypertension BP soft Held avapro for now, continue atenolol  COPD Gold III Stable Continue inhalers, neb treatment  Type 2 diabetes mellitus Continue Lantus, SSI, accu-checks    Code Status: Full   Family Communication: None at bedside   Disposition Plan: Home as per Vaslow and once infusion complete   Consultants:  Dr Mickeal Skinner, Oncology  Procedures:  None  Antimicrobials:  None  DVT prophylaxis: Lovenox   Objective: Vitals:   01/31/18 1954 01/31/18 2033 02/01/18 0604 02/01/18 0831  BP:  113/63 106/64   Pulse:  60 (!) 57   Resp:  17 17   Temp:  98 F (36.7 C) 97.7 F (36.5 C)   TempSrc:  Oral Oral   SpO2: 97% 96% 91% 96%  Weight:      Height:        Intake/Output Summary (Last 24 hours) at 02/01/2018 1117 Last data filed at 01/31/2018 1804 Gross per 24 hour  Intake 534.75  ml  Output -  Net 534.75 ml   Filed Weights   01/31/18 1414  Weight: 112.3 kg    Exam:   General: NAD   Cardiovascular: S1, S2 present   Respiratory: Diminished BS b/l  Abdomen: Soft, NT, ND, BS present  Musculoskeletal: No pedal edema b/l  Skin: Normal  Psychiatry: Normal mood   Data Reviewed: CBC: Recent Labs  Lab 01/31/18 1221 02/01/18 0422  WBC 9.6 9.2  HGB 11.7* 11.0*  HCT 36.0* 32.4*  MCV 100.8* 97.6  PLT 208 573   Basic Metabolic Panel: Recent Labs  Lab 01/31/18 1221 02/01/18 0422  NA 139 141  K 4.2 3.7  CL 109 109  CO2 22 23  GLUCOSE 122* 180*  BUN 18 23  CREATININE 0.80 0.88  CALCIUM 9.1 8.7*   GFR: Estimated Creatinine Clearance: 119.2 mL/min (by C-G formula based on SCr of 0.88 mg/dL). Liver Function Tests: Recent Labs  Lab 01/31/18 1221  AST 16  ALT 22  ALKPHOS 108  BILITOT 0.8  PROT 6.6  ALBUMIN 3.5   No results for input(s): LIPASE, AMYLASE in the last 168 hours. No results for input(s): AMMONIA in the last 168 hours. Coagulation Profile: No results for input(s): INR, PROTIME in the last 168 hours. Cardiac Enzymes: No results for input(s): CKTOTAL, CKMB, CKMBINDEX, TROPONINI in the last 168 hours. BNP (last 3 results) Recent Labs    09/15/17 1730  PROBNP 49.0   HbA1C: No results for input(s): HGBA1C  in the last 72 hours. CBG: Recent Labs  Lab 01/31/18 1751 01/31/18 2035 02/01/18 0731  GLUCAP 143* 201* 170*   Lipid Profile: No results for input(s): CHOL, HDL, LDLCALC, TRIG, CHOLHDL, LDLDIRECT in the last 72 hours. Thyroid Function Tests: No results for input(s): TSH, T4TOTAL, FREET4, T3FREE, THYROIDAB in the last 72 hours. Anemia Panel: No results for input(s): VITAMINB12, FOLATE, FERRITIN, TIBC, IRON, RETICCTPCT in the last 72 hours. Urine analysis:    Component Value Date/Time   COLORURINE YELLOW 02/01/2018 [place] 02/01/2018 1047   LABSPEC 1.015 02/01/2018 1047   PHURINE 7.0  02/01/2018 1047   GLUCOSEU NEGATIVE 02/01/2018 1047   HGBUR NEGATIVE 02/01/2018 1047   BILIRUBINUR NEGATIVE 02/01/2018 1047   KETONESUR NEGATIVE 02/01/2018 1047   PROTEINUR NEGATIVE 02/01/2018 1047   UROBILINOGEN 0.2 03/28/2007 1459   NITRITE NEGATIVE 02/01/2018 1047   LEUKOCYTESUR NEGATIVE 02/01/2018 1047   Sepsis Labs: @LABRCNTIP (procalcitonin:4,lacticidven:4)  )No results found for this or any previous visit (from the past 240 hour(s)).    Studies: No results found.  Scheduled Meds: . acetaZOLAMIDE  500 mg Oral Q12H  . atenolol  50 mg Oral Daily  . enoxaparin (LOVENOX) injection  40 mg Subcutaneous Q24H  . insulin aspart  0-5 Units Subcutaneous QHS  . insulin aspart  0-9 Units Subcutaneous TID WC  . insulin glargine  10 Units Subcutaneous QHS  . irbesartan  75 mg Oral Daily  . levETIRAcetam  500 mg Oral BID  . mometasone-formoterol  2 puff Inhalation BID    Continuous Infusions: . sodium chloride Stopped (01/31/18 1703)  .  sodium bicarbonate infusion 1/4 NS 1000 mL 125 mL/hr at 02/01/18 0940     LOS: 1 day     Alma Friendly, MD [facility]   If 7PM-7AM, please contact night-coverage [email or web address]  02/01/2018, 11:17 AM

## 2018-02-01 NOTE — Progress Notes (Signed)
 [facility] [address]  [place], [place] [address] [phone]   Neuro-Oncology Progress Note  Date of Service: 01/31/18 Patient Name: Tim [name] Patient MRN: [id number] Patient DOB: 11/11/1955 Provider:  K , MD  Identifying Statement:  Tim [name] is a 62 y.o. male with left frontal CNS lymphoma  Interval History:  Tim [name] received MTX yesterday afternoon without complication.  No complaints today.  Due for rituxan tomorrow AM.  Medications: No current facility-administered medications on file prior to encounter.    Current Outpatient Medications on File Prior to Encounter  Medication Sig Dispense Refill  . allopurinol (ZYLOPRIM) 300 MG tablet Take 300 mg by mouth daily.    . atenolol (TENORMIN) 100 MG tablet Take 50 mg by mouth daily.    . b complex vitamins tablet Take 1 tablet by mouth daily.    . budesonide-formoterol (SYMBICORT) 160-4.5 MCG/ACT inhaler Take 2 puffs first thing in am and then another 2 puffs about 12 hours later. (Patient taking differently: Inhale 2 puffs into the lungs 2 (two) times daily. ) 1 Inhaler 12  . Insulin Isophane & Regular Human (NOVOLIN 70/30 FLEXPEN) (70-30) 100 UNIT/ML PEN Inject 15 Units into the skin 2 (two) times daily. 15 mL 11  . levETIRAcetam (KEPPRA) 500 MG tablet Take 1 tablet (500 mg total) by mouth 2 (two) times daily. 60 tablet 5  . Multiple Vitamin (MULTIVITAMIN) tablet Take 1 tablet by mouth daily.    . valsartan (DIOVAN) 160 MG tablet Take 1 tablet (160 mg total) by mouth daily. 30 tablet 11  . acetaZOLAMIDE (DIAMOX) 250 MG tablet Take 2 tablets (500 mg total) by mouth 2 (two) times daily for 3 days. (Patient not taking: Reported on 01/31/2018) 12 tablet 0  . albuterol (PROVENTIL HFA;VENTOLIN HFA) 108 (90 Base) MCG/ACT inhaler Inhale 2 puffs into the lungs every 4 (four) hours as needed for wheezing or shortness of breath. (Patient not taking:  Reported on 01/03/2018) 1 Inhaler 0  . blood glucose meter kit and supplies KIT Dispense based on patient and insurance preference. Use up to four times daily as directed. (FOR ICD-9 250.00, 250.01). 1 each 0  . dexamethasone (DECADRON) 2 MG tablet Take 1 tablet (2 mg total) by mouth daily. (Patient not taking: Reported on 01/31/2018) 30 tablet 0  . Insulin Pen Needle (PEN NEEDLES 3/16") 31G X 5 MM MISC 1 Container by Does not apply route 2 (two) times daily. 100 each 0  . metFORMIN (GLUCOPHAGE) 500 MG tablet Take 1 tablet (500 mg total) by mouth 2 (two) times daily with a meal. (Patient not taking: Reported on 01/31/2018) 60 tablet 3    Allergies: No Known Allergies   Past Medical History:  Past Medical History:  Diagnosis Date  . Arthritis   . Brain tumor (HCC)   . COPD (chronic obstructive pulmonary disease) (HCC)   . Diabetes mellitus without complication (HCC)    patient states he was taken off meds at last appointment with PCP and is diet controlled  . Dyspnea   . Gout   . Hypertension 08/02/2017  . Pneumonia    Past Surgical History: none    Social History:  Social History   Socioeconomic History  . Marital status: Significant Other    Spouse name: Tim [name]  . Number of children: Not on file  . Years of education: 16  . Highest education level: Bachelor's degree (e.g., BA, AB, BS)  Occupational History  .   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 Financial resource strain: Not hard at all  . Food insecurity:    Worry: Never true    Inability: Never true  . Transportation needs:    Medical: No    Non-medical: No  Tobacco Use  . Smoking status: Former Smoker    Packs/day: 2.00    Years: 40.00    Pack years: 80.00    Types: Cigarettes    Last attempt to quit: 08/10/2011    Years since quitting: 6.4  . Smokeless tobacco: Never Used  Substance and Sexual Activity  . Alcohol use: Yes    Alcohol/week: 14.0 standard drinks    Types: 14  Cans of beer per week    Frequency: Never  . Drug use: Never  . Sexual activity: Not Currently  Lifestyle  . Physical activity:    Days per week: 0 days    Minutes per session: 0 min  . Stress: Only a little  Relationships  . Social connections:    Talks on phone: Once a week    Gets together: Once a week    Attends religious service: Never    Active member of club or organization: No    Attends meetings of clubs or organizations: Never    Relationship status: Living with partner  . Intimate partner violence:    Fear of current or ex partner: No    Emotionally abused: No    Physically abused: No    Forced sexual activity: No  Other Topics Concern  . Not on file  Social History Narrative  . Not on file   Family History:  Family History  Problem Relation Age of Onset  . Cancer Brother     Review of Systems: Constitutional: Denies fevers, chills or abnormal [name] loss Eyes: Denies blurriness of vision Ears, nose, mouth, throat, and face: Denies mucositis or sore throat Respiratory: chronic hoarseness Cardiovascular: Denies palpitation, chest discomfort or lower extremity swelling Gastrointestinal:  Denies nausea, constipation, diarrhea GU: Denies dysuria or incontinence Skin: Denies abnormal skin rashes Neurological: Per HPI Musculoskeletal: Denies joint pain, back or neck discomfort. No decrease in ROM Behavioral/Psych: Denies anxiety, disturbance in thought content, and mood instability  Physical Exam: Vitals:   02/01/18 0604 02/01/18 0831  BP: 106/64   Pulse: (!) 57   Resp: 17   Temp: 97.7 F (36.5 C)   SpO2: 91% 96%   KPS: 80. General: Alert, cooperative, pleasant, in no acute distress Head: Biopsy scar noted, dry and intact. EENT: No conjunctival injection or scleral icterus. Oral mucosa moist Lungs: Resp effort normal Cardiac: Regular rate and rhythm Abdomen: Soft, non-distended abdomen Skin: No rashes cyanosis or petechiae. Extremities: No clubbing  or edema  Neurologic Exam: Mental Status: Awake, alert, attentive to examiner. Oriented to self and environment. Language fluent with intact comprehension, repetition, reading. Cranial Nerves: Visual acuity is grossly normal. Visual fields are full. Extra-ocular movements intact. No ptosis. Face is symmetric, tongue midline. Motor: Tone and bulk are normal. Power 4+/5 in right arm and leg, subtle hip girdle weakness. Reflexes are symmetric, no pathologic reflexes present. Intact finger to nose bilaterally Sensory: Intact to light touch and temperature Gait: Normal, independent   Labs: I have reviewed the data as listed    Component Value Date/Time   NA 141 02/01/2018 0422   K 3.7 02/01/2018 0422   CL 109 02/01/2018 0422   CO2 23 02/01/2018 0422  GLUCOSE 180 (H) 02/01/2018 0422   BUN 23 02/01/2018 0422   CREATININE 0.88 02/01/2018 0422   CREATININE 0.80 01/31/2018 1221   CALCIUM 8.7 (L) 02/01/2018 0422   PROT 6.6 01/31/2018 1221   ALBUMIN 3.5 01/31/2018 1221   AST 16 01/31/2018 1221   ALT 22 01/31/2018 1221   ALKPHOS 108 01/31/2018 1221   BILITOT 0.8 01/31/2018 1221   GFRNONAA >60 02/01/2018 0422   GFRNONAA >60 01/31/2018 1221   GFRAA >60 02/01/2018 0422   GFRAA >60 01/31/2018 1221   Lab Results  Component Value Date   WBC 9.2 02/01/2018   NEUTROABS 8.1 (H) 01/07/2018   HGB 11.0 (L) 02/01/2018   HCT 32.4 (L) 02/01/2018   MCV 97.6 02/01/2018   PLT 189 02/01/2018     Imaging:  CLINICAL DATA:  Initial treatment strategy for CNS lymphoma.  EXAM: NUCLEAR MEDICINE PET SKULL BASE TO THIGH  TECHNIQUE: 13.5 mCi F-18 FDG was injected intravenously. Full-ring PET imaging was performed from the skull base to thigh after the radiotracer. CT data was obtained and used for attenuation correction and anatomic localization.  Fasting blood glucose: 141 mg/dl  COMPARISON:  CT scans 08/04/2017 and MRI brain 08/16/2017.  FINDINGS: Mediastinal blood pool activity: SUV max  2.85  NECK: No hypermetabolic lymph nodes in the neck.  Incidental CT findings: none  CHEST: Numerous ill-defined hazy pulmonary lesions are hypermetabolic. Findings most consistent with some type of inflammatory or infectious lung disease or possibly drug reaction. There also hypermetabolic hilar and mediastinal nodes. These are not enlarged however and again I think these are probably reactive. This does not look like lymphoma in the lungs and the chest CT from 08/04/2017 was relatively normal.  Incidental CT findings: none  ABDOMEN/PELVIS: No abnormal hypermetabolic activity within the liver, pancreas, adrenal glands, or spleen. No hypermetabolic lymph nodes in the abdomen or pelvis.  Incidental CT findings: The liver and spleen are normal in size.  SKELETON: No focal hypermetabolic activity to suggest skeletal metastasis.  Incidental CT findings: none  IMPRESSION: 1. Numerous pulmonary lesions which are hypermetabolic along with hypermetabolic small mediastinal and hilar nodes. Findings most likely due to an inflammatory or infectious pulmonary process or possible drug reaction. Pulmonary lymphoma is highly unlikely. Recommend correlation with clinical findings and appropriate treatment. A short-term follow-up chest CT may be helpful (2-3 months). 2. No findings for lymphoma Nissen vomit of the abdomen/pelvis or skeletal structures. No axillary or inguinal adenopathy.   Electronically Signed   By: P.  Gallerani M.D.   On: 09/10/2017 17:09  [facility] Clinician Interpretation: I have personally reviewed the MRI brain with and without contrast from outside institution dated 11/09/17.  My interpretation, in the context of the patient's clinical presentation, is progressive disease  Pathology     Assessment/Plan  Primary CNS lymphoma (HCC) - Plan: ondansetron (ZOFRAN) 16 mg, dexamethasone (DECADRON) 20 mg in sodium chloride 0.9 % 50 mL IVPB, methotrexate (PF)  8.61 g in sodium chloride 0.9 % 1,000 mL chemo infusion, acetaZOLAMIDE (DIAMOX) 12 hr capsule 500 mg, ondansetron (ZOFRAN) 16 mg, dexamethasone (DECADRON) 20 mg in sodium chloride 0.9 % 50 mL IVPB, leucovorin injection 16 mg   Updates for 02/01/18: -con't IV fluids @ 125/hr -added Diamox 500mg BID for increased alkalinzation, previously tolerated -Ritux tomorrow AM -check MTX level tomorrow, based on level today may be ready for discharge   Tim [name] is clinically stable today and ready for initiation of cycle #3 HD-MTX and rituximab.    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   -appropriate to proceed with Cycle 3 of high dose MTX with leucovorin rescue -MTX-R therapy C3D1today: Methotrexate 3.5g/m2 given over 4 hours once urine pH at or above 7.0 -Leucovorin 16mg q6 hours starting 24 hours after MTX infusion   -Rituxan can be given 24-48 hours after MTX infusion -To start ASAP: IV fluids, 1/4NS with 100mEq sodium bicarbonate at 125mL/hr.  Will adjust rate until urine pH at or above 7.0, con't to titrate to 8.0. -daily labs with cbc, cmp -4x daily urine pH once it reaches target pH of 7 -daily MTX levels until MTX levels <0.10 -continue same supportive medications  -please to do not administer proton pump inhibitors during MTX admissions  2) Focal seizures -Con't 500mg BID Keppra  3) Reported sleep apnea/hypopnea -Uses non-pressurized oxygen 3L nocturnal.  Doesn't require O2 even with exertion during the day.   4) Diabetes -Insulin sliding scale -uses 70/30 15 units BID at home  We appreciate the opportunity to participate in the care of Tim [name].   Will continue to follow daily during this admission.  Direct contact # is [phone].    The total time spent in the encounter was 25 minutes and more than 50% was on counseling and review  of test results    K , MD Medical Director of Neuro-Oncology [facility] 01/31/18 4:12 PM 

## 2018-02-01 NOTE — Telephone Encounter (Signed)
Scheduled appt per 9/24 sch message - left message with appt date and time and sent reminder letter in the mail with appt date and time.

## 2018-02-02 DIAGNOSIS — C8589 Other specified types of non-Hodgkin lymphoma, extranodal and solid organ sites: Secondary | ICD-10-CM

## 2018-02-02 LAB — CBC WITH DIFFERENTIAL/PLATELET
BASOS ABS: 0 10*3/uL (ref 0.0–0.1)
BASOS PCT: 0 %
EOS ABS: 0 10*3/uL (ref 0.0–0.7)
Eosinophils Relative: 0 %
HCT: 30.9 % — ABNORMAL LOW (ref 39.0–52.0)
HEMOGLOBIN: 10.3 g/dL — AB (ref 13.0–17.0)
Lymphocytes Relative: 14 %
Lymphs Abs: 1.3 10*3/uL (ref 0.7–4.0)
MCH: 33 pg (ref 26.0–34.0)
MCHC: 33.3 g/dL (ref 30.0–36.0)
MCV: 99 fL (ref 78.0–100.0)
Monocytes Absolute: 0.8 10*3/uL (ref 0.1–1.0)
Monocytes Relative: 9 %
NEUTROS ABS: 7.3 10*3/uL (ref 1.7–7.7)
NEUTROS PCT: 77 %
Platelets: 185 10*3/uL (ref 150–400)
RBC: 3.12 MIL/uL — AB (ref 4.22–5.81)
RDW: 16.6 % — ABNORMAL HIGH (ref 11.5–15.5)
WBC: 9.4 10*3/uL (ref 4.0–10.5)

## 2018-02-02 LAB — URINALYSIS, ROUTINE W REFLEX MICROSCOPIC
BILIRUBIN URINE: NEGATIVE
Bacteria, UA: NONE SEEN
Bilirubin Urine: NEGATIVE
Glucose, UA: NEGATIVE mg/dL
Glucose, UA: NEGATIVE mg/dL
HGB URINE DIPSTICK: NEGATIVE
Hgb urine dipstick: NEGATIVE
Ketones, ur: NEGATIVE mg/dL
Ketones, ur: NEGATIVE mg/dL
Leukocytes, UA: NEGATIVE
Leukocytes, UA: NEGATIVE
Nitrite: NEGATIVE
Nitrite: NEGATIVE
PH: 8 (ref 5.0–8.0)
PH: 9 — AB (ref 5.0–8.0)
Protein, ur: NEGATIVE mg/dL
Protein, ur: NEGATIVE mg/dL
SPECIFIC GRAVITY, URINE: 1.004 — AB (ref 1.005–1.030)
Specific Gravity, Urine: 1.014 (ref 1.005–1.030)

## 2018-02-02 LAB — METHOTREXATE: METHOTREXATE: 0.58

## 2018-02-02 LAB — GLUCOSE, CAPILLARY
GLUCOSE-CAPILLARY: 156 mg/dL — AB (ref 70–99)
GLUCOSE-CAPILLARY: 172 mg/dL — AB (ref 70–99)
Glucose-Capillary: 123 mg/dL — ABNORMAL HIGH (ref 70–99)
Glucose-Capillary: 110 mg/dL — ABNORMAL HIGH (ref 70–99)
Glucose-Capillary: 122 mg/dL — ABNORMAL HIGH (ref 70–99)

## 2018-02-02 LAB — COMPREHENSIVE METABOLIC PANEL
ALK PHOS: 65 U/L (ref 38–126)
ALT: 22 U/L (ref 0–44)
ANION GAP: 6 (ref 5–15)
AST: 20 U/L (ref 15–41)
Albumin: 3.3 g/dL — ABNORMAL LOW (ref 3.5–5.0)
BUN: 22 mg/dL (ref 8–23)
CO2: 26 mmol/L (ref 22–32)
CREATININE: 0.72 mg/dL (ref 0.61–1.24)
Calcium: 8.7 mg/dL — ABNORMAL LOW (ref 8.9–10.3)
Chloride: 111 mmol/L (ref 98–111)
Glucose, Bld: 152 mg/dL — ABNORMAL HIGH (ref 70–99)
Potassium: 3.2 mmol/L — ABNORMAL LOW (ref 3.5–5.1)
Sodium: 143 mmol/L (ref 135–145)
TOTAL PROTEIN: 5.9 g/dL — AB (ref 6.5–8.1)
Total Bilirubin: 0.5 mg/dL (ref 0.3–1.2)

## 2018-02-02 MED ORDER — LEUCOVORIN CALCIUM INJECTION 100 MG
15.0000 mg | Freq: Four times a day (QID) | INTRAMUSCULAR | Status: AC
  Administered 2018-02-02 – 2018-02-03 (×4): 16 mg via INTRAVENOUS
  Filled 2018-02-02 (×7): qty 0.8

## 2018-02-02 MED ORDER — SODIUM CHLORIDE 0.9 % IV SOLN
375.0000 mg/m2 | Freq: Once | INTRAVENOUS | Status: AC
  Administered 2018-02-02: 900 mg via INTRAVENOUS
  Filled 2018-02-02: qty 50

## 2018-02-02 MED ORDER — DIPHENHYDRAMINE HCL 50 MG PO CAPS
50.0000 mg | ORAL_CAPSULE | Freq: Once | ORAL | Status: AC
  Administered 2018-02-02: 50 mg via ORAL
  Filled 2018-02-02: qty 1

## 2018-02-02 MED ORDER — ACETAMINOPHEN 325 MG PO TABS
650.0000 mg | ORAL_TABLET | Freq: Once | ORAL | Status: AC
  Administered 2018-02-02: 650 mg via ORAL
  Filled 2018-02-02: qty 2

## 2018-02-02 NOTE — Progress Notes (Signed)
[facility] [place], [place] [address] [phone]   Neuro-Oncology Progress Note  Date of Service: 01/31/18 Patient Name: Tim [name] Patient MRN: [id number] Patient DOB: November 22, 1955 Provider: Ventura Sellers, MD  Identifying Statement:  Tim [name] is a 62 y.o. male with left frontal CNS lymphoma  Interval History:  Tim [name] just completed rituxan infusion.  No complaints today.  Medications: No current facility-administered medications on file prior to encounter.    Current Outpatient Medications on File Prior to Encounter  Medication Sig Dispense Refill  . allopurinol (ZYLOPRIM) 300 MG tablet Take 300 mg by mouth daily.    Marland Kitchen atenolol (TENORMIN) 100 MG tablet Take 50 mg by mouth daily.    Marland Kitchen b complex vitamins tablet Take 1 tablet by mouth daily.    . budesonide-formoterol (SYMBICORT) 160-4.5 MCG/ACT inhaler Take 2 puffs first thing in am and then another 2 puffs about 12 hours later. (Patient taking differently: Inhale 2 puffs into the lungs 2 (two) times daily. ) 1 Inhaler 12  . Insulin Isophane & Regular Human (NOVOLIN 70/30 FLEXPEN) (70-30) 100 UNIT/ML PEN Inject 15 Units into the skin 2 (two) times daily. 15 mL 11  . levETIRAcetam (KEPPRA) 500 MG tablet Take 1 tablet (500 mg total) by mouth 2 (two) times daily. 60 tablet 5  . Multiple Vitamin (MULTIVITAMIN) tablet Take 1 tablet by mouth daily.    . valsartan (DIOVAN) 160 MG tablet Take 1 tablet (160 mg total) by mouth daily. 30 tablet 11  . acetaZOLAMIDE (DIAMOX) 250 MG tablet Take 2 tablets (500 mg total) by mouth 2 (two) times daily for 3 days. (Patient not taking: Reported on 01/31/2018) 12 tablet 0  . albuterol (PROVENTIL HFA;VENTOLIN HFA) 108 (90 Base) MCG/ACT inhaler Inhale 2 puffs into the lungs every 4 (four) hours as needed for wheezing or shortness of breath. (Patient not taking: Reported on 01/03/2018) 1 Inhaler 0  . blood glucose  meter kit and supplies KIT Dispense based on patient and insurance preference. Use up to four times daily as directed. (FOR ICD-9 250.00, 250.01). 1 each 0  . dexamethasone (DECADRON) 2 MG tablet Take 1 tablet (2 mg total) by mouth daily. (Patient not taking: Reported on 01/31/2018) 30 tablet 0  . Insulin Pen Needle (PEN NEEDLES 3/16") 31G X 5 MM MISC 1 Container by Does not apply route 2 (two) times daily. 100 each 0  . metFORMIN (GLUCOPHAGE) 500 MG tablet Take 1 tablet (500 mg total) by mouth 2 (two) times daily with a meal. (Patient not taking: Reported on 01/31/2018) 60 tablet 3    Allergies: No Known Allergies   Past Medical History:  Past Medical History:  Diagnosis Date  . Arthritis   . Brain tumor ([place])   . COPD (chronic obstructive pulmonary disease) ([place])   . Diabetes mellitus without complication [facility])    patient states he was taken off meds at last appointment with PCP and is diet controlled  . Dyspnea   . Gout   . Hypertension 08/02/2017  . Pneumonia    Past Surgical History: none    Social History:  Social History   Socioeconomic History  . Marital status: Significant Other    Spouse name: [name]  . Number of children: Not on file  . Years of education: 3  . Highest education level: Bachelor's degree (e.g., BA, AB, BS)  Occupational History  . 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 Financial resource strain: Not hard at all  . Food insecurity:    Worry: Never true    Inability: Never true  . Transportation needs:    Medical: No    Non-medical: No  Tobacco Use  . Smoking status: Former Smoker    Packs/day: 2.00    Years: 40.00    Pack years: 80.00    Types: Cigarettes    Last attempt to quit: 08/10/2011    Years since quitting: 6.4  . Smokeless tobacco: Never Used  Substance and Sexual Activity  . Alcohol use: Yes    Alcohol/week: 14.0 standard drinks    Types: 14 Cans of beer per week    Frequency: Never  . Drug  use: Never  . Sexual activity: Not Currently  Lifestyle  . Physical activity:    Days per week: 0 days    Minutes per session: 0 min  . Stress: Only a little  Relationships  . Social connections:    Talks on phone: Once a week    Gets together: Once a week    Attends religious service: Never    Active member of club or organization: No    Attends meetings of clubs or organizations: Never    Relationship status: Living with partner  . Intimate partner violence:    Fear of current or ex partner: No    Emotionally abused: No    Physically abused: No    Forced sexual activity: No  Other Topics Concern  . Not on file  Social History Narrative  . Not on file   Family History:  Family History  Problem Relation Age of Onset  . Cancer Brother     Review of Systems: Constitutional: Denies fevers, chills or abnormal weight loss Eyes: Denies blurriness of vision Ears, nose, mouth, throat, and face: Denies mucositis or sore throat Respiratory: chronic hoarseness Cardiovascular: Denies palpitation, chest discomfort or lower extremity swelling Gastrointestinal:  Denies nausea, constipation, diarrhea GU: Denies dysuria or incontinence Skin: Denies abnormal skin rashes Neurological: Per HPI Musculoskeletal: Denies joint pain, back or neck discomfort. No decrease in ROM Behavioral/Psych: Denies anxiety, disturbance in thought content, and mood instability  Physical Exam: Vitals:   02/02/18 1138 02/02/18 1300  BP: 123/67 (P) 116/61  Pulse: (!) 57 (P) 60  Resp: 16 (P) 16  Temp: 98 F (36.7 C) (P) 97.8 F (36.6 C)  SpO2: 98% (P) 97%   KPS: 80. General: Alert, cooperative, pleasant, in no acute distress Head: Biopsy scar noted, dry and intact. EENT: No conjunctival injection or scleral icterus. Oral mucosa moist Lungs: Resp effort normal Cardiac: Regular rate and rhythm Abdomen: Soft, non-distended abdomen Skin: No rashes cyanosis or petechiae. Extremities: No clubbing or  edema  Neurologic Exam: Mental Status: Awake, alert, attentive to examiner. Oriented to self and environment. Language fluent with intact comprehension, repetition, reading. Cranial Nerves: Visual acuity is grossly normal. Visual fields are full. Extra-ocular movements intact. No ptosis. Face is symmetric, tongue midline. Motor: Tone and bulk are normal. Power 4+/5 in right arm and leg, subtle hip girdle weakness. Reflexes are symmetric, no pathologic reflexes present. Intact finger to nose bilaterally Sensory: Intact to light touch and temperature Gait: Deferred  Labs: I have reviewed the data as listed    Component Value Date/Time   NA 143 02/02/2018 0607   K 3.2 (L) 02/02/2018 0607   CL 111 02/02/2018 0607   CO2 26 02/02/2018 0607   GLUCOSE 152 (H)  02/02/2018 0607   BUN 22 02/02/2018 0607   CREATININE 0.72 02/02/2018 0607   CREATININE 0.80 01/31/2018 1221   CALCIUM 8.7 (L) 02/02/2018 0607   PROT 5.9 (L) 02/02/2018 0607   ALBUMIN 3.3 (L) 02/02/2018 0607   AST 20 02/02/2018 0607   AST 16 01/31/2018 1221   ALT 22 02/02/2018 0607   ALT 22 01/31/2018 1221   ALKPHOS 65 02/02/2018 0607   BILITOT 0.5 02/02/2018 0607   BILITOT 0.8 01/31/2018 1221   GFRNONAA >60 02/02/2018 0607   GFRNONAA >60 01/31/2018 1221   GFRAA >60 02/02/2018 0607   GFRAA >60 01/31/2018 1221   Lab Results  Component Value Date   WBC 9.4 02/02/2018   NEUTROABS 7.3 02/02/2018   HGB 10.3 (L) 02/02/2018   HCT 30.9 (L) 02/02/2018   MCV 99.0 02/02/2018   PLT 185 02/02/2018     Imaging:  CLINICAL DATA:  Initial treatment strategy for CNS lymphoma.  EXAM: NUCLEAR MEDICINE PET SKULL BASE TO THIGH  TECHNIQUE: 13.5 mCi F-18 FDG was injected intravenously. Full-ring PET imaging was performed from the skull base to thigh after the radiotracer. CT data was obtained and used for attenuation correction and anatomic localization.  Fasting blood glucose: 141 mg/dl  COMPARISON:  CT scans 08/04/2017 and MRI  brain 08/16/2017.  FINDINGS: Mediastinal blood pool activity: SUV max 2.85  NECK: No hypermetabolic lymph nodes in the neck.  Incidental CT findings: none  CHEST: Numerous ill-defined hazy pulmonary lesions are hypermetabolic. Findings most consistent with some type of inflammatory or infectious lung disease or possibly drug reaction. There also hypermetabolic hilar and mediastinal nodes. These are not enlarged however and again I think these are probably reactive. This does not look like lymphoma in the lungs and the chest CT from 08/04/2017 was relatively normal.  Incidental CT findings: none  ABDOMEN/PELVIS: No abnormal hypermetabolic activity within the liver, pancreas, adrenal glands, or spleen. No hypermetabolic lymph nodes in the abdomen or pelvis.  Incidental CT findings: The liver and spleen are normal in size.  SKELETON: No focal hypermetabolic activity to suggest skeletal metastasis.  Incidental CT findings: none  IMPRESSION: 1. Numerous pulmonary lesions which are hypermetabolic along with hypermetabolic small mediastinal and hilar nodes. Findings most likely due to an inflammatory or infectious pulmonary process or possible drug reaction. Pulmonary lymphoma is highly unlikely. Recommend correlation with clinical findings and appropriate treatment. A short-term follow-up chest CT may be helpful (2-3 months). 2. No findings for lymphoma Nissen vomit of the abdomen/pelvis or skeletal structures. No axillary or inguinal adenopathy.   Electronically Signed   By: Marijo Sanes M.D.   On: 09/10/2017 17:09  [facility] Clinician Interpretation: I have personally reviewed the MRI brain with and without contrast from outside institution dated 11/09/17.  My interpretation, in the context of the patient's clinical presentation, is progressive disease  Pathology     Assessment/Plan  Primary CNS lymphoma ([place]) - Plan: ondansetron (ZOFRAN) 16 mg, dexamethasone  (DECADRON) 20 mg in sodium chloride 0.9 % 50 mL IVPB, methotrexate (PF) 8.61 g in sodium chloride 0.9 % 1,000 mL chemo infusion, acetaZOLAMIDE (DIAMOX) 12 hr capsule 500 mg, ondansetron (ZOFRAN) 16 mg, dexamethasone (DECADRON) 20 mg in sodium chloride 0.9 % 50 mL IVPB, leucovorin injection 16 mg, acetaminophen (TYLENOL) tablet 650 mg, diphenhydrAMINE (BENADRYL) capsule 50 mg, riTUXimab (RITUXAN) 900 mg in sodium chloride 0.9 % 160 mL infusion, leucovorin injection 16 mg, PHARMACY COMMUNICATION ONCOLOGY, PHARMACY COMMUNICATION ONCOLOGY   Updates for 02/02/18: -con't IV fluids @ 125/hr -  con't Diamox 547m BID -check MTX level tomorrow, can discharge if below 0.10 -follow up appts already made  [place].  RETURNING EARLY PM.   Tim [name] clinically stable today and ready for initiation of cycle #3 HD-MTX and rituximab.    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   -appropriate to proceed with Cycle 3 of high dose MTX with leucovorin rescue -MTX-R therapy C3D1today: Methotrexate 3.5g/m2 given over 4 hours once urine pH at or above 7.0 -Leucovorin 113mq6 hours starting 24 hours after MTX infusion   -Rituxan can be given 24-48 hours after MTX infusion -To start ASAP: IV fluids, 1/4NS with 10020msodium bicarbonate at 125m30m.  Will adjust rate until urine pH at or above 7.0, con't to titrate to 8.0. -daily labs with cbc, cmp -4x daily urine pH once it reaches target pH of 7 -daily MTX levels until MTX levels <0.10 -continue same supportive medications  -please to do not administer proton pump inhibitors during MTX admissions  2) Focal seizures -Con't 500mg31m Keppra  3) Reported sleep apnea/hypopnea -Uses non-pressurized oxygen 3L nocturnal.  Doesn't require O2 even with exertion during the day.   4) Diabetes -Insulin sliding  scale -uses 70/30 15 units BID at home  We appreciate the opportunity to participate in the care of ChrisRoyal Hawthornill continue to follow daily during this admission.  Direct contact # is [phone] total time spent in the encounter was 25 minutes and more than 50% was on counseling and review of test results   Tim SellersMedical Director of Neuro-Oncology [facility] LLC[facility]leConcord3/19 3:51 PM

## 2018-02-03 LAB — CBC WITH DIFFERENTIAL/PLATELET
BASOS PCT: 0 %
Basophils Absolute: 0 10*3/uL (ref 0.0–0.1)
EOS PCT: 1 %
Eosinophils Absolute: 0 10*3/uL (ref 0.0–0.7)
HEMATOCRIT: 31.1 % — AB (ref 39.0–52.0)
Hemoglobin: 10.3 g/dL — ABNORMAL LOW (ref 13.0–17.0)
Lymphocytes Relative: 37 %
Lymphs Abs: 1.9 10*3/uL (ref 0.7–4.0)
MCH: 33.1 pg (ref 26.0–34.0)
MCHC: 33.1 g/dL (ref 30.0–36.0)
MCV: 100 fL (ref 78.0–100.0)
MONO ABS: 0.2 10*3/uL (ref 0.1–1.0)
MONOS PCT: 4 %
NEUTROS ABS: 3 10*3/uL (ref 1.7–7.7)
Neutrophils Relative %: 58 %
PLATELETS: 160 10*3/uL (ref 150–400)
RBC: 3.11 MIL/uL — ABNORMAL LOW (ref 4.22–5.81)
RDW: 16.9 % — AB (ref 11.5–15.5)
WBC: 5.1 10*3/uL (ref 4.0–10.5)

## 2018-02-03 LAB — COMPREHENSIVE METABOLIC PANEL
ALBUMIN: 3.1 g/dL — AB (ref 3.5–5.0)
ALK PHOS: 58 U/L (ref 38–126)
ALT: 34 U/L (ref 0–44)
ANION GAP: 5 (ref 5–15)
AST: 34 U/L (ref 15–41)
BILIRUBIN TOTAL: 0.6 mg/dL (ref 0.3–1.2)
BUN: 22 mg/dL (ref 8–23)
CALCIUM: 8.8 mg/dL — AB (ref 8.9–10.3)
CO2: 27 mmol/L (ref 22–32)
CREATININE: 0.74 mg/dL (ref 0.61–1.24)
Chloride: 111 mmol/L (ref 98–111)
GFR calc Af Amer: >60 mL/min (ref >60)
GFR calc non Af Amer: >60 mL/min (ref >60)
GLUCOSE: 102 mg/dL — AB (ref 70–99)
Potassium: 3.1 mmol/L — ABNORMAL LOW (ref 3.5–5.1)
Sodium: 143 mmol/L (ref 135–145)
Total Protein: 5.8 g/dL — ABNORMAL LOW (ref 6.5–8.1)

## 2018-02-03 LAB — URINALYSIS, ROUTINE W REFLEX MICROSCOPIC
Bilirubin Urine: NEGATIVE
Bilirubin Urine: NEGATIVE
GLUCOSE, UA: NEGATIVE mg/dL
GLUCOSE, UA: NEGATIVE mg/dL
Hgb urine dipstick: NEGATIVE
Hgb urine dipstick: NEGATIVE
KETONES UR: NEGATIVE mg/dL
Ketones, ur: NEGATIVE mg/dL
LEUKOCYTES UA: NEGATIVE
Leukocytes, UA: NEGATIVE
NITRITE: NEGATIVE
Nitrite: NEGATIVE
PH: 8 (ref 5.0–8.0)
PH: 9 — AB (ref 5.0–8.0)
Protein, ur: NEGATIVE mg/dL
Protein, ur: NEGATIVE mg/dL
SPECIFIC GRAVITY, URINE: 1.011 (ref 1.005–1.030)
Specific Gravity, Urine: 1.008 (ref 1.005–1.030)

## 2018-02-03 LAB — GLUCOSE, CAPILLARY
GLUCOSE-CAPILLARY: 95 mg/dL (ref 70–99)
Glucose-Capillary: 93 mg/dL (ref 70–99)
Glucose-Capillary: 130 mg/dL — ABNORMAL HIGH (ref 70–99)

## 2018-02-03 LAB — METHOTREXATE: Methotrexate: 0.1

## 2018-02-03 MED ORDER — HEPARIN SOD (PORK) LOCK FLUSH 100 UNIT/ML IV SOLN: 500.0000 [IU] | INTRAVENOUS | Status: DC | PRN

## 2018-02-03 MED ORDER — HEPARIN SOD (PORK) LOCK FLUSH 100 UNIT/ML IV SOLN: 500.0000 [IU] | INTRAVENOUS | Status: DC

## 2018-02-03 NOTE — Progress Notes (Signed)
[facility] [place], [place] [address] [phone]   Neuro-Oncology Progress Note  Date of Service: 01/31/18 Patient Name: HAIG GERARDO Patient MRN: [id number] Patient DOB: [birth year] Provider: Ventura Sellers, MD  Identifying Statement:  SHELTON SQUARE is a 62 y.o. male with left frontal CNS lymphoma  Interval History:  WAYBURN SHALER is resting comfortably, continuing to receive IV bicarbonate and leucovorin rescue.  No complaints today.  Medications: No current facility-administered medications on file prior to encounter.    Current Outpatient Medications on File Prior to Encounter  Medication Sig Dispense Refill  . allopurinol (ZYLOPRIM) 300 MG tablet Take 300 mg by mouth daily.    Marland Kitchen atenolol (TENORMIN) 100 MG tablet Take 50 mg by mouth daily.    Marland Kitchen b complex vitamins tablet Take 1 tablet by mouth daily.    . budesonide-formoterol (SYMBICORT) 160-4.5 MCG/ACT inhaler Take 2 puffs first thing in am and then another 2 puffs about 12 hours later. (Patient taking differently: Inhale 2 puffs into the lungs 2 (two) times daily. ) 1 Inhaler 12  . Insulin Isophane & Regular Human (NOVOLIN 70/30 FLEXPEN) (70-30) 100 UNIT/ML PEN Inject 15 Units into the skin 2 (two) times daily. 15 mL 11  . levETIRAcetam (KEPPRA) 500 MG tablet Take 1 tablet (500 mg total) by mouth 2 (two) times daily. 60 tablet 5  . Multiple Vitamin (MULTIVITAMIN) tablet Take 1 tablet by mouth daily.    . valsartan (DIOVAN) 160 MG tablet Take 1 tablet (160 mg total) by mouth daily. 30 tablet 11  . acetaZOLAMIDE (DIAMOX) 250 MG tablet Take 2 tablets (500 mg total) by mouth 2 (two) times daily for 3 days. (Patient not taking: Reported on 01/31/2018) 12 tablet 0  . albuterol (PROVENTIL HFA;VENTOLIN HFA) 108 (90 Base) MCG/ACT inhaler Inhale 2 puffs into the lungs every 4 (four) hours as needed for wheezing or shortness of breath. (Patient not taking:  Reported on 01/03/2018) 1 Inhaler 0  . blood glucose meter kit and supplies KIT Dispense based on patient and insurance preference. Use up to four times daily as directed. (FOR ICD-9 250.00, 250.01). 1 each 0  . dexamethasone (DECADRON) 2 MG tablet Take 1 tablet (2 mg total) by mouth daily. (Patient not taking: Reported on 01/31/2018) 30 tablet 0  . Insulin Pen Needle (PEN NEEDLES 3/16") 31G X 5 MM MISC 1 Container by Does not apply route 2 (two) times daily. 100 each 0  . metFORMIN (GLUCOPHAGE) 500 MG tablet Take 1 tablet (500 mg total) by mouth 2 (two) times daily with a meal. (Patient not taking: Reported on 01/31/2018) 60 tablet 3    Allergies: No Known Allergies   Past Medical History:  Past Medical History:  Diagnosis Date  . Arthritis   . Brain tumor ([place])   . COPD (chronic obstructive pulmonary disease) ([place])   . Diabetes mellitus without complication [facility])    patient states he was taken off meds at last appointment with PCP and is diet controlled  . Dyspnea   . Gout   . Hypertension 08/02/2017  . Pneumonia    Past Surgical History: none    Social History:  Social History   Socioeconomic History  . Marital status: Significant Other    Spouse name: [name]  . Number of children: Not on file  . Years of education: 16  . Highest education level: Bachelor's degree (e.g., BA, AB, BS)  Occupational History  .  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 Financial resource strain: Not hard at all  . Food insecurity:    Worry: Never true    Inability: Never true  . Transportation needs:    Medical: No    Non-medical: No  Tobacco Use  . Smoking status: Former Smoker    Packs/day: 2.00    Years: 40.00    Pack years: 80.00    Types: Cigarettes    Last attempt to quit: 08/10/2011    Years since quitting: 6.4  . Smokeless tobacco: Never Used  Substance and Sexual Activity  . Alcohol use: Yes    Alcohol/week: 14.0 standard drinks    Types: 14  Cans of beer per week    Frequency: Never  . Drug use: Never  . Sexual activity: Not Currently  Lifestyle  . Physical activity:    Days per week: 0 days    Minutes per session: 0 min  . Stress: Only a little  Relationships  . Social connections:    Talks on phone: Once a week    Gets together: Once a week    Attends religious service: Never    Active member of club or organization: No    Attends meetings of clubs or organizations: Never    Relationship status: Living with partner  . Intimate partner violence:    Fear of current or ex partner: No    Emotionally abused: No    Physically abused: No    Forced sexual activity: No  Other Topics Concern  . Not on file  Social History Narrative  . Not on file   Family History:  Family History  Problem Relation Age of Onset  . Cancer Brother     Review of Systems: Constitutional: Denies fevers, chills or abnormal weight loss Eyes: Denies blurriness of vision Ears, nose, mouth, throat, and face: Denies mucositis or sore throat Respiratory: chronic hoarseness Cardiovascular: Denies palpitation, chest discomfort or lower extremity swelling Gastrointestinal:  Denies nausea, constipation, diarrhea GU: Denies dysuria or incontinence Skin: Denies abnormal skin rashes Neurological: Per HPI Musculoskeletal: Denies joint pain, back or neck discomfort. No decrease in ROM Behavioral/Psych: Denies anxiety, disturbance in thought content, and mood instability  Physical Exam: Vitals:   02/02/18 2135 02/03/18 0642  BP: 125/71 122/77  Pulse: (!) 57 64  Resp: 16 17  Temp: 97.7 F (36.5 C) 98 F (36.7 C)  SpO2: 98% 99%   KPS: 80. General: Alert, cooperative, pleasant, in no acute distress Head: Biopsy scar noted, dry and intact. EENT: No conjunctival injection or scleral icterus. Oral mucosa moist Lungs: Resp effort normal Cardiac: Regular rate and rhythm Abdomen: Soft, non-distended abdomen Skin: No rashes cyanosis or  petechiae. Extremities: No clubbing or edema  Neurologic Exam: Mental Status: Awake, alert, attentive to examiner. Oriented to self and environment. Language fluent with intact comprehension, repetition, reading. Cranial Nerves: Visual acuity is grossly normal. Visual fields are full. Extra-ocular movements intact. No ptosis. Face is symmetric, tongue midline. Motor: Tone and bulk are normal. Power 4+/5 in right arm and leg, subtle hip girdle weakness. Reflexes are symmetric, no pathologic reflexes present. Intact finger to nose bilaterally Sensory: Intact to light touch and temperature Gait: Deferred  Labs: I have reviewed the data as listed    Component Value Date/Time   NA 143 02/03/2018 0530   K 3.1 (L) 02/03/2018 0530   CL 111 02/03/2018 0530   CO2 27 02/03/2018 0530  GLUCOSE 102 (H) 02/03/2018 0530   BUN 22 02/03/2018 0530   CREATININE 0.74 02/03/2018 0530   CREATININE 0.80 01/31/2018 1221   CALCIUM 8.8 (L) 02/03/2018 0530   PROT 5.8 (L) 02/03/2018 0530   ALBUMIN 3.1 (L) 02/03/2018 0530   AST 34 02/03/2018 0530   AST 16 01/31/2018 1221   ALT 34 02/03/2018 0530   ALT 22 01/31/2018 1221   ALKPHOS 58 02/03/2018 0530   BILITOT 0.6 02/03/2018 0530   BILITOT 0.8 01/31/2018 1221   GFRNONAA >60 02/03/2018 0530   GFRNONAA >60 01/31/2018 1221   GFRAA >60 02/03/2018 0530   GFRAA >60 01/31/2018 1221   Lab Results  Component Value Date   WBC 5.1 02/03/2018   NEUTROABS 3.0 02/03/2018   HGB 10.3 (L) 02/03/2018   HCT 31.1 (L) 02/03/2018   MCV 100.0 02/03/2018   PLT 160 02/03/2018     Imaging:  CLINICAL DATA:  Initial treatment strategy for CNS lymphoma.  EXAM: NUCLEAR MEDICINE PET SKULL BASE TO THIGH  TECHNIQUE: 13.5 mCi F-18 FDG was injected intravenously. Full-ring PET imaging was performed from the skull base to thigh after the radiotracer. CT data was obtained and used for attenuation correction and anatomic localization.  Fasting blood glucose: 141  mg/dl  COMPARISON:  CT scans 08/04/2017 and MRI brain 08/16/2017.  FINDINGS: Mediastinal blood pool activity: SUV max 2.85  NECK: No hypermetabolic lymph nodes in the neck.  Incidental CT findings: none  CHEST: Numerous ill-defined hazy pulmonary lesions are hypermetabolic. Findings most consistent with some type of inflammatory or infectious lung disease or possibly drug reaction. There also hypermetabolic hilar and mediastinal nodes. These are not enlarged however and again I think these are probably reactive. This does not look like lymphoma in the lungs and the chest CT from 08/04/2017 was relatively normal.  Incidental CT findings: none  ABDOMEN/PELVIS: No abnormal hypermetabolic activity within the liver, pancreas, adrenal glands, or spleen. No hypermetabolic lymph nodes in the abdomen or pelvis.  Incidental CT findings: The liver and spleen are normal in size.  SKELETON: No focal hypermetabolic activity to suggest skeletal metastasis.  Incidental CT findings: none  IMPRESSION: 1. Numerous pulmonary lesions which are hypermetabolic along with hypermetabolic small mediastinal and hilar nodes. Findings most likely due to an inflammatory or infectious pulmonary process or possible drug reaction. Pulmonary lymphoma is highly unlikely. Recommend correlation with clinical findings and appropriate treatment. A short-term follow-up chest CT may be helpful (2-3 months). 2. No findings for lymphoma Nissen vomit of the abdomen/pelvis or skeletal structures. No axillary or inguinal adenopathy.   Electronically Signed   By: Marijo Sanes M.D.   On: 09/10/2017 17:09  [facility] Clinician Interpretation: I have personally reviewed the MRI brain with and without contrast from outside institution dated 11/09/17.  My interpretation, in the context of the patient's clinical presentation, is progressive disease  Pathology     Assessment/Plan  Primary CNS lymphoma ([place])  - Plan: ondansetron (ZOFRAN) 16 mg, dexamethasone (DECADRON) 20 mg in sodium chloride 0.9 % 50 mL IVPB, methotrexate (PF) 8.61 g in sodium chloride 0.9 % 1,000 mL chemo infusion, acetaZOLAMIDE (DIAMOX) 12 hr capsule 500 mg, ondansetron (ZOFRAN) 16 mg, dexamethasone (DECADRON) 20 mg in sodium chloride 0.9 % 50 mL IVPB, leucovorin injection 16 mg, acetaminophen (TYLENOL) tablet 650 mg, diphenhydrAMINE (BENADRYL) capsule 50 mg, riTUXimab (RITUXAN) 900 mg in sodium chloride 0.9 % 160 mL infusion, leucovorin injection 16 mg, PHARMACY COMMUNICATION ONCOLOGY, PHARMACY COMMUNICATION ONCOLOGY   Updates for 02/03/18: **PLEASE DISCHARGE  TO HOME IF TODAY'S MTX LEVEL <0.10** -per lab [facility] currently processing result -con't IV fluids @ 125/hr and leucovorin until confirmed mtx level <0.10 -discontinue diamox at discharge -follow up appts already made   Mr. Middleton is clinically stable today and ready for initiation of cycle #3 HD-MTX and rituximab.    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   -appropriate to proceed with Cycle 3 of high dose MTX with leucovorin rescue -MTX-R therapy C3D1today: Methotrexate 3.5g/m2 given over 4 hours once urine pH at or above 7.0 -Leucovorin 56m q6 hours starting 24 hours after MTX infusion   -Rituxan can be given 24-48 hours after MTX infusion -To start ASAP: IV fluids, 1/4NS with 1012m sodium bicarbonate at 12534mr.  Will adjust rate until urine pH at or above 7.0, con't to titrate to 8.0. -daily labs with cbc, cmp -4x daily urine pH once it reaches target pH of 7 -daily MTX levels until MTX levels <0.10 -continue same supportive medications  -please to do not administer proton pump inhibitors during MTX admissions  2) Focal seizures -Con't 500m23mD Keppra  3) Reported sleep apnea/hypopnea -Uses non-pressurized oxygen 3L  nocturnal.  Doesn't require O2 even with exertion during the day.   4) Diabetes -Insulin sliding scale -uses 70/30 15 units BID at home  We appreciate the opportunity to participate in the care of ChriRoyal HawthornWill continue to follow daily during this admission.  Direct contact # is [phone] The total time spent in the encounter was 25 minutes and more than 50% was on counseling and review of test results   ZachVentura Sellers Medical Director of Neuro-Oncology [facility] LLCWeslBig [place]pp[place]23/19 2:36 PM

## 2018-02-03 NOTE — Progress Notes (Signed)
Pt discharged home with wife in stable condition. Discharge instructions given. No scripts orders. Pt and spouse with no immediate questions or concerns at this time. Pt opted to ambulate off unit.

## 2018-02-03 NOTE — Discharge Summary (Signed)
Physician Discharge Summary  Tim Tim [name] SRP:[id number] DOB: 01/02/1956 DOA: 01/31/2018  PCP: Leonard Downing, MD  Admit date: 01/31/2018 Discharge date: 02/03/2018  Time spent: 20 minutes  Recommendations for Outpatient Follow-up:  1. Follow-up with PCP regarding glycemic control-has not been taking metformin 2. Follow-up with Dr. 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 etc. as per their notes Patient completed therapy on 9/26 and was discharged home in stable state to initiate outpatient chemotherapy per oncology No significant med changes were made  Consultations:  Oncology  Discharge Exam: Vitals:   02/03/18 0642 02/03/18 1511  BP: 122/77 132/72  Pulse: 64 (!) 56  Resp: 17 18  Temp: 98 F (36.7 C) 98 F (36.7 C)  SpO2: 99% 97%    General: Awake alert pleasant no distress Cardiovascular: S1-S2 no murmur Respiratory: Clinically clear no added sound Neurologically intact moving all 4 limbs Slight lower extremity swelling   Discharge Instructions   Discharge Instructions    Diet - low sodium heart healthy   Complete by:  As directed    Discharge instructions   Complete by:  As directed    Please follow-up with oncology for scans etc. as an outpatient Ask your PCP about metformin-usually we do not prescribe Lantus alone for glycemic control   Increase activity slowly   Complete by:  As directed       Allergies as of 02/03/2018   No Known Allergies     Medication List    STOP taking these medications   acetaZOLAMIDE 250 MG tablet Commonly known as:  DIAMOX   dexamethasone 2 MG tablet Commonly known as:  DECADRON   valsartan 160 MG tablet Commonly known as:  DIOVAN     TAKE these medications   albuterol 108 (90 Base) MCG/ACT inhaler Commonly known as:  PROVENTIL HFA;VENTOLIN HFA Inhale 2 puffs into the lungs every 4 (four) hours as needed for wheezing or shortness of breath.   allopurinol 300 MG tablet Commonly known as:  ZYLOPRIM Take 300 mg by mouth daily.   atenolol 100 MG tablet Commonly known as:  TENORMIN Take 50 mg by mouth daily.   b complex vitamins tablet Take 1 tablet by mouth daily.   blood glucose meter kit and supplies Kit Dispense based on patient and insurance preference. Use up to four times daily as directed. (FOR ICD-9 250.00, 250.01).   budesonide-formoterol 160-4.5 MCG/ACT inhaler Commonly known as:  SYMBICORT Take 2 puffs first thing in am and then another 2 puffs about 12 hours later. What changed:    how much to take  how to take this  when to take this  additional instructions   Insulin Isophane & Regular Human (70-30) 100 UNIT/ML PEN Commonly known as:  HUMULIN 70/30 MIX Inject 15 Units into the skin 2 (two) times daily.   levETIRAcetam 500 MG tablet Commonly known as:  KEPPRA Take 1 tablet (500 mg total) by mouth 2 (two)  times daily.   metFORMIN 500 MG tablet Commonly known as:  GLUCOPHAGE Take 1 tablet (500 mg total) by mouth 2 (two) times daily with a meal.   multivitamin tablet Take 1 tablet by mouth daily.   Pen Needles 3/16" 31G X 5 MM Misc 1 Container by Does not apply route 2 (two) times daily.      No Known Allergies    The results of significant diagnostics from this hospitalization (including imaging, microbiology, ancillary and laboratory) are listed below for reference.    Significant Diagnostic  Studies: No results found.  Microbiology: No results found for this or any previous visit (from the past 240 hour(s)).   Labs: Basic Metabolic Panel: Recent Labs  Lab 01/31/18 1221 02/01/18 0422 02/02/18 0607 02/03/18 0530  NA 139 141 143 143  K 4.2 3.7 3.2* 3.1*  CL 109 109 111 111  CO2 '22 23 26 27  ' GLUCOSE 122* 180* 152* 102*  BUN '18 23 22 22  ' CREATININE 0.80 0.88 0.72 0.74  CALCIUM 9.1 8.7* 8.7* 8.8*   Liver Function Tests: Recent Labs  Lab 01/31/18 1221 02/02/18 0607 02/03/18 0530  AST 16 20 34  ALT 22 22 34  ALKPHOS 108 65 58  BILITOT 0.8 0.5 0.6  PROT 6.6 5.9* 5.8*  ALBUMIN 3.5 3.3* 3.1*   No results for input(s): LIPASE, AMYLASE in the last 168 hours. No results for input(s): AMMONIA in the last 168 hours. CBC: Recent Labs  Lab 01/31/18 1221 02/01/18 0422 02/02/18 0607 02/03/18 0530  WBC 9.6 9.2 9.4 5.1  NEUTROABS  --   --  7.3 3.0  HGB 11.7* 11.0* 10.3* 10.3*  HCT 36.0* 32.4* 30.9* 31.1*  MCV 100.8* 97.6 99.0 100.0  PLT 208 189 185 160   Cardiac Enzymes: No results for input(s): CKTOTAL, CKMB, CKMBINDEX, TROPONINI in the last 168 hours. BNP: BNP (last 3 results) No results for input(s): BNP in the last 8760 hours.  ProBNP (last 3 results) Recent Labs    09/15/17 1730  PROBNP 49.0    CBG: Recent Labs  Lab 02/02/18 1122 02/02/18 1614 02/02/18 2133 02/03/18 0740 02/03/18 1217  GLUCAP 110* 172* 122* 93 95       Signed:  Nita Sells MD   [facility] 02/03/2018, 4:59 PM

## 2018-02-03 NOTE — Progress Notes (Signed)
LATE ENTRY for 9/24  PROGRESS NOTE  Tim [name] BHA:[id number] DOB: 06/11/1955 DOA: 01/31/2018 PCP: Leonard Downing, MD  HPI/Recap of past 24 hours: HPI from Dr Marzetta Board  62 y.o. male with medical history significant of COPD, DM, HTN, primary CNS lymphoma who is being admitted from Dr. Renda Rolls office to start the third round of chemo (methotrexate and rituximab) in the hospital.   Pt had no complaints other than weakness. Pt admitted for further management and chemo administration.  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 Mickeal Skinner  Focal seizures Continue Keppra 500 twice daily, receiving Zofran/Decadron combination  Hypertension BP 973Z to 329 systolic- continue atenolol--PT AvaPro held  COPD Gold III Stable Continue Dulera 2 puffs twice daily, no current wheeze  Type 2 diabetes mellitus Continue Lantus, SSI, accu-checks-CBG 110-152, continue Lantus 10    Code Status: Full   Family Communication: None at bedside   Disposition Plan: Home as per [place] and once infusion complete   Consultants:  Dr Mickeal Skinner, Oncology  Procedures:  None  Antimicrobials:  None  DVT prophylaxis: Lovenox   Objective: Vitals:   01/31/18 1414 02/02/18 0357 02/02/18 1138 02/02/18 1300  BP:  130/67 123/67 (P) 116/61  Pulse:  72 (!) 57 (P) 60  Resp:  12 16 (P) 16  Temp:  98.2 F (36.8 C) 98 F (36.7 C) (P) 97.8 F (36.6 C)  TempSrc:  Oral Oral (P) Oral  SpO2:  98% 98% (P) 97%  Weight: 112.3 kg     Height: 6\' 3"  (1.905 m)       Intake/Output Summary (Last 24 hours) at 02/02/2018 1326 Last data filed at 02/02/2018 0918 Gross per 24 hour  Intake 4440 ml  Output 900 ml  Net 3540 ml   Filed Weights   01/31/18  1414  Weight: 112.3 kg    Exam:     Data Reviewed: CBC: Recent Labs  Lab 01/31/18 1221 02/01/18 0422 02/02/18 0607  WBC 9.6 9.2 9.4  NEUTROABS  --   --  7.3  HGB 11.7* 11.0* 10.3*  HCT 36.0* 32.4* 30.9*  MCV 100.8* 97.6 99.0  PLT 208 189 924   Basic Metabolic Panel: Recent Labs  Lab 01/31/18 1221 02/01/18 0422 02/02/18 0607  NA 139 141 143  K 4.2 3.7 3.2*  CL 109 109 111  CO2 22 23 26   GLUCOSE 122* 180* 152*  BUN 18 23 22   CREATININE 0.80 0.88 0.72  CALCIUM 9.1 8.7* 8.7*   GFR: Estimated Creatinine Clearance: 131.1 mL/min (by C-G formula based on SCr of 0.72 mg/dL). Liver Function Tests: Recent Labs  Lab 01/31/18 1221 02/02/18 0607  AST 16 20  ALT 22 22  ALKPHOS 108 65  BILITOT 0.8 0.5  PROT 6.6 5.9*  ALBUMIN 3.5 3.3*   No results for input(s): LIPASE, AMYLASE in the last 168 hours. No results for input(s): AMMONIA in the last 168 hours. Coagulation Profile: No results for input(s): INR, PROTIME in the last 168 hours. Cardiac Enzymes: No results for input(s): CKTOTAL, CKMB, CKMBINDEX, TROPONINI in the last 168 hours. BNP (last 3 results) Recent Labs    09/15/17 1730  PROBNP 49.0   HbA1C: No results for input(s): HGBA1C in the last 72 hours. CBG: Recent Labs  Lab 02/01/18 0731 02/01/18 1200 02/01/18 2109 02/02/18 0731 02/02/18 1122  GLUCAP 170* 156* 192* 123* 110*   Lipid Profile: No results for input(s): CHOL, HDL, LDLCALC, TRIG, CHOLHDL, LDLDIRECT in the last 72 hours. Thyroid Function Tests: No results for input(s): TSH, T4TOTAL, FREET4, T3FREE, THYROIDAB in the last 72 hours. Anemia Panel: No results for input(s): VITAMINB12, FOLATE, FERRITIN, TIBC, IRON, RETICCTPCT in the last 72 hours. Urine analysis:    Component Value Date/Time   COLORURINE STRAW (A) 02/02/2018 0400   APPEARANCEUR CLEAR 02/02/2018 0400   LABSPEC 1.004 (L) 02/02/2018 0400   PHURINE 8.0 02/02/2018 0400   GLUCOSEU NEGATIVE 02/02/2018 0400   HGBUR NEGATIVE  02/02/2018 0400   BILIRUBINUR NEGATIVE 02/02/2018 0400   KETONESUR NEGATIVE 02/02/2018 0400   PROTEINUR NEGATIVE 02/02/2018 0400   UROBILINOGEN 0.2 03/28/2007 1459   NITRITE NEGATIVE 02/02/2018 0400   LEUKOCYTESUR NEGATIVE 02/02/2018 0400   Sepsis Labs: @LABRCNTIP (procalcitonin:4,lacticidven:4)  )No results found for this or any previous visit (from the past 240 hour(s)).    Studies: No results found.  Scheduled Meds: . acetaZOLAMIDE  500 mg Oral Q12H  . atenolol  50 mg Oral Daily  . enoxaparin (LOVENOX) injection  40 mg Subcutaneous Q24H  . insulin aspart  0-5 Units Subcutaneous QHS  . insulin aspart  0-9 Units Subcutaneous TID WC  . insulin glargine  10 Units Subcutaneous QHS  . leucovorin  16 mg Intravenous Q6H  . levETIRAcetam  500 mg Oral BID  . mometasone-formoterol  2 puff Inhalation BID    Continuous Infusions: . sodium chloride Stopped (01/31/18 1703)  . ondansetron (ZOFRAN) with dexamethasone (DECADRON) IV    .  sodium bicarbonate infusion 1/4 NS 1000 mL 125 mL/hr at 02/02/18 0316     LOS: 2 days     Nita Sells, MD [facility]   If 7PM-7AM, please contact night-coverage [email or web address]  02/02/2018, 1:26 PM

## 2018-02-08 ENCOUNTER — Other Ambulatory Visit: Payer: Self-pay | Admitting: Radiation Therapy

## 2018-02-11 ENCOUNTER — Ambulatory Visit
Admit: 2018-02-11 | Discharge: 2018-02-11 | Disposition: A | Discharge status: Home / Self Care | Payer: Medicare Other | Attending: Internal Medicine | Admitting: Internal Medicine

## 2018-02-11 DIAGNOSIS — C8589 Other specified types of non-Hodgkin lymphoma, extranodal and solid organ sites: Secondary | ICD-10-CM

## 2018-02-11 MED ORDER — GADOBENATE DIMEGLUMINE 529 MG/ML IV SOLN
20.0000 mL | Freq: Once | INTRAVENOUS | Status: AC | PRN
  Administered 2018-02-11: 20 mL via INTRAVENOUS

## 2018-02-14 ENCOUNTER — Inpatient Hospital Stay: Payer: Medicare Other

## 2018-02-14 ENCOUNTER — Inpatient Hospital Stay: Payer: Medicare Other | Attending: Internal Medicine | Admitting: Internal Medicine

## 2018-02-14 ENCOUNTER — Telehealth: Payer: Self-pay

## 2018-02-14 ENCOUNTER — Encounter: Payer: Self-pay | Admitting: Internal Medicine

## 2018-02-14 VITALS — BP 148/84 | HR 65 | Temp 97.9°F | Resp 18

## 2018-02-14 DIAGNOSIS — C8339 Primary central nervous system lymphoma: Secondary | ICD-10-CM

## 2018-02-14 DIAGNOSIS — C8589 Other specified types of non-Hodgkin lymphoma, extranodal and solid organ sites: Secondary | ICD-10-CM

## 2018-02-14 DIAGNOSIS — Z87891 Personal history of nicotine dependence: Secondary | ICD-10-CM | POA: Insufficient documentation

## 2018-02-14 DIAGNOSIS — Z95828 Presence of other vascular implants and grafts: Secondary | ICD-10-CM | POA: Insufficient documentation

## 2018-02-14 LAB — CMP (CANCER CENTER ONLY)
ALT: 23 U/L (ref 0–44)
AST: 24 U/L (ref 15–41)
Albumin: 3.7 g/dL (ref 3.5–5.0)
Alkaline Phosphatase: 91 U/L (ref 38–126)
Anion gap: 8 (ref 5–15)
BUN: 10 mg/dL (ref 8–23)
CHLORIDE: 106 mmol/L (ref 98–111)
CO2: 31 mmol/L (ref 22–32)
CREATININE: 0.68 mg/dL (ref 0.61–1.24)
Calcium: 9.3 mg/dL (ref 8.9–10.3)
GFR, Est AFR Am: >60 mL/min (ref >60)
GFR, Estimated: >60 mL/min (ref >60)
Glucose, Bld: 119 mg/dL — ABNORMAL HIGH (ref 70–99)
POTASSIUM: 4.3 mmol/L (ref 3.5–5.1)
Sodium: 145 mmol/L (ref 135–145)
Total Bilirubin: 0.6 mg/dL (ref 0.3–1.2)
Total Protein: 6.7 g/dL (ref 6.5–8.1)

## 2018-02-14 LAB — CBC WITH DIFFERENTIAL (CANCER CENTER ONLY)
Basophils Absolute: 0 10*3/uL (ref 0.0–0.1)
Basophils Relative: 1 %
EOS ABS: 0.1 10*3/uL (ref 0.0–0.5)
EOS PCT: 2 %
HCT: 35.7 % — ABNORMAL LOW (ref 38.4–49.9)
HEMOGLOBIN: 12.1 g/dL — AB (ref 13.0–17.1)
LYMPHS ABS: 1.5 10*3/uL (ref 0.9–3.3)
Lymphocytes Relative: 25 %
MCH: 34 pg — AB (ref 27.2–33.4)
MCHC: 34 g/dL (ref 32.0–36.0)
MCV: 100.2 fL — ABNORMAL HIGH (ref 79.3–98.0)
MONOS PCT: 10 %
Monocytes Absolute: 0.6 10*3/uL (ref 0.1–0.9)
NEUTROS PCT: 62 %
Neutro Abs: 3.8 10*3/uL (ref 1.5–6.5)
Platelet Count: 197 10*3/uL (ref 140–400)
RBC: 3.57 MIL/uL — AB (ref 4.20–5.82)
RDW: 16.7 % — ABNORMAL HIGH (ref 11.0–14.6)
WBC Count: 6 10*3/uL (ref 4.0–10.3)

## 2018-02-14 MED ORDER — HEPARIN SOD (PORK) LOCK FLUSH 100 UNIT/ML IV SOLN
500.0000 [IU] | Freq: Once | INTRAVENOUS | Status: AC
  Filled 2018-02-14: qty 5

## 2018-02-14 MED ORDER — TEMOZOLOMIDE 180 MG PO CAPS: 150.0000 mg/m2/d | ORAL_CAPSULE | Freq: Every day | ORAL | 0 refills | Status: DC

## 2018-02-14 MED ORDER — SODIUM CHLORIDE 0.9% FLUSH
10.0000 mL | Freq: Once | INTRAVENOUS | Status: AC
  Filled 2018-02-14: qty 10

## 2018-02-14 MED ORDER — ONDANSETRON HCL 8 MG PO TABS: 8.0000 mg | ORAL_TABLET | Freq: Two times a day (BID) | ORAL | 1 refills | Status: DC | PRN

## 2018-02-14 NOTE — Telephone Encounter (Signed)
Printed calender of upcoming appointment. Per 10/7 los Also mailed a letter with a calender enclosed

## 2018-02-14 NOTE — Progress Notes (Signed)
DISCONTINUE OFF PATHWAY REGIMEN - Neuro   [id number]:MT-R (HD Methotrexate D1,15 + Rituximab Weekly (x 6) + Temozolomide Daily D7-11) q28 Days (Induction):   A cycle is every 28 days:     Methotrexate      Leucovorin      Rituximab      Rituximab      Temozolomide   **Always confirm dose/schedule in your pharmacy ordering system**  REASON: Continuation Of Treatment PRIOR TREATMENT: Off Pathway: MT-R (HD Methotrexate D1,15 + Rituximab Weekly (x 6) + Temozolomide Daily D7-11) q28 Days (Induction) TREATMENT RESPONSE: Complete Response (CR)  START OFF PATHWAY REGIMEN - Neuro   [id number]:Temozolomide 200 mg/m2 D1-5 q28 Days:   A cycle is every 28 days:     Temozolomide   **Always confirm dose/schedule in your pharmacy ordering system**  Patient Characteristics: Primary CNS Lymphoma, Newly Diagnosed, Consolidation / Maintenance Disease Classification: Primary CNS Lymphoma Disease Status: Newly Diagnosed  Intent of Therapy: Non-Curative / Palliative Intent, Discussed with Patient

## 2018-02-14 NOTE — Progress Notes (Signed)
[facility] [place], [place] [address] [phone]   Interval Evaluation  Date of Service: 02/14/18 Patient Name: Tim [name] Patient MRN: [id number] Patient DOB: May 11, 1956 Provider: Ventura Sellers, MD  Identifying Statement:  Tim [name] is a 62 y.o. 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  No issues since discharge after completing 3rd cycle of MTX and rituxan inpatient.  No new or progressive neurologic deficits.  He does express concerns regarding frustration with time spent in the hospital and financial concern hospital bills "piling up".  Port still in place.    Medications: Current Outpatient Medications on File Prior to Visit  Medication Sig Dispense Refill  . allopurinol (ZYLOPRIM) 300 MG tablet Take 300 mg by mouth daily.    Marland Kitchen atenolol (TENORMIN) 100 MG tablet Take 50 mg by mouth daily.    Marland Kitchen b complex vitamins tablet Take 1 tablet by mouth daily.    . blood glucose meter kit and supplies KIT Dispense based on patient and insurance preference. Use up to four times daily as directed. (FOR ICD-9 250.00, 250.01). 1 each 0  . budesonide-formoterol (SYMBICORT) 160-4.5 MCG/ACT inhaler Take 2 puffs first thing in am and then another 2 puffs about 12 hours later. (Patient taking differently: Inhale 2 puffs into the lungs 2 (two) times daily. ) 1 Inhaler 12  . Insulin Isophane & Regular Human (NOVOLIN 70/30 FLEXPEN) (70-30) 100 UNIT/ML PEN Inject 15 Units into the skin 2 (two) times daily. 15 mL 11  . Insulin Pen Needle (PEN NEEDLES 3/16") 31G X 5 MM MISC 1 Container by Does not apply route 2 (two) times daily. 100 each 0  . levETIRAcetam (KEPPRA) 500 MG tablet Take 1 tablet (500 mg total) by mouth 2 (two) times daily. 60 tablet 5  . Multiple Vitamin (MULTIVITAMIN) tablet Take 1 tablet by mouth daily.    Marland Kitchen albuterol  (PROVENTIL HFA;VENTOLIN HFA) 108 (90 Base) MCG/ACT inhaler Inhale 2 puffs into the lungs every 4 (four) hours as needed for wheezing or shortness of breath. (Patient not taking: Reported on 02/14/2018) 1 Inhaler 0   No current facility-administered medications on file prior to visit.     Allergies: No Known Allergies   Past Medical History:  Past Medical History:  Diagnosis Date  . Arthritis   . Brain tumor ([place])   . COPD (chronic obstructive pulmonary disease) ([place])   . Diabetes mellitus without complication [facility])    patient states he was taken off meds at last appointment with PCP and is diet controlled  . Dyspnea   . Gout   . Hypertension 08/02/2017  . Pneumonia    Past Surgical History: none    Social History:  Social History   Socioeconomic History  . Marital status: Significant Other    Spouse name: [name]  . Number of children: Not on file  . Years of education: 95  . Highest education level: Bachelor's degree (e.g., BA, AB, BS)  Occupational History  . 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 Financial resource strain: Not hard at all  . Food insecurity:    Worry: Never true    Inability: Never true  . Transportation needs:    Medical: No    Non-medical: No  Tobacco Use  . Smoking status: Former Smoker  Packs/day: 2.00    Years: 40.00    Pack years: 80.00    Types: Cigarettes    Last attempt to quit: 08/10/2011    Years since quitting: 6.5  . Smokeless tobacco: Never Used  Substance and Sexual Activity  . Alcohol use: Yes    Alcohol/week: 14.0 standard drinks    Types: 14 Cans of beer per week    Frequency: Never  . Drug use: Never  . Sexual activity: Not Currently  Lifestyle  . Physical activity:    Days per week: 0 days    Minutes per session: 0 min  . Stress: Only a little  Relationships  . Social connections:    Talks on phone: Once a week    Gets together: Once a week    Attends religious service: Never      Active member of club or organization: No    Attends meetings of clubs or organizations: Never    Relationship status: Living with partner  . Intimate partner violence:    Fear of current or ex partner: No    Emotionally abused: No    Physically abused: No    Forced sexual activity: No  Other Topics Concern  . Not on file  Social History Narrative  . Not on file   Family History:  Family History  Problem Relation Age of Onset  . Cancer Brother     Review of Systems: Constitutional: Denies fevers, chills or abnormal weight loss Eyes: Denies blurriness of vision Ears, nose, mouth, throat, and face: Denies mucositis or sore throat Respiratory: denies sob Cardiovascular: Denies palpitation, chest discomfort or lower extremity swelling Gastrointestinal:  Denies nausea, constipation, diarrhea GU: Denies dysuria or incontinence Skin: Denies abnormal skin rashes Neurological: Per HPI Musculoskeletal: Denies joint pain, back or neck discomfort. No decrease in ROM Behavioral/Psych: Denies anxiety, disturbance in thought content, and mood instability  Physical Exam: Vitals:   02/14/18 1151  BP: (!) 148/84  Pulse: 65  Resp: 18  Temp: 97.9 F (36.6 C)  SpO2: 96%   KPS: 80. General: Alert, cooperative, pleasant, in no acute distress Head: Biopsy scar noted, dry and intact. EENT: No conjunctival injection or scleral icterus. Oral mucosa moist Lungs: Resp effort normal Cardiac: Regular rate and rhythm Abdomen: Soft, non-distended abdomen Skin: No rashes cyanosis or petechiae. Extremities: No clubbing or edema  Neurologic Exam: Mental Status: Awake, alert, attentive to examiner. Oriented to self and environment. Language fluent with intact comprehension, repetition, reading. Cranial Nerves: Visual acuity is grossly normal. Visual fields are full. Extra-ocular movements intact. No ptosis. Face is symmetric, tongue midline. Motor: Tone and bulk are normal. Power 4+/5 in right  arm and leg, subtle hip girdle weakness. Reflexes are symmetric, no pathologic reflexes present. Intact finger to nose bilaterally Sensory: Intact to light touch and temperature Gait: Normal, independent   Labs: I have reviewed the data as listed    Component Value Date/Time   NA 145 02/14/2018 1132   K 4.3 02/14/2018 1132   CL 106 02/14/2018 1132   CO2 31 02/14/2018 1132   GLUCOSE 119 (H) 02/14/2018 1132   BUN 10 02/14/2018 1132   CREATININE 0.68 02/14/2018 1132   CALCIUM 9.3 02/14/2018 1132   PROT 6.7 02/14/2018 1132   ALBUMIN 3.7 02/14/2018 1132   AST 24 02/14/2018 1132   ALT 23 02/14/2018 1132   ALKPHOS 91 02/14/2018 1132   BILITOT 0.6 02/14/2018 1132   GFRNONAA >60 02/14/2018 1132   GFRAA >60 02/14/2018 1132  Lab Results  Component Value Date   WBC 6.0 02/14/2018   NEUTROABS 3.8 02/14/2018   HGB 12.1 (L) 02/14/2018   HCT 35.7 (L) 02/14/2018   MCV 100.2 (H) 02/14/2018   PLT 197 02/14/2018     Imaging:  [place] Clinician Interpretation: I have personally reviewed the CNS images as listed.  My interpretation, in the context of the patient's clinical presentation, is stable disease  Mr Jeri Cos Wo Contrast  Result Date: 02/11/2018 CLINICAL DATA:  Primary CNS lymphoma. Postop methotrexate treatment. Initial left frontal biopsy nondiagnostic. Repeat biopsy 11/10/2017 left frontal lobe diagnostic for lymphoma. EXAM: MRI HEAD WITHOUT AND WITH CONTRAST TECHNIQUE: Multiplanar, multiecho pulse sequences of the brain and surrounding structures were obtained without and with intravenous contrast. CONTRAST:  5m MULTIHANCE GADOBENATE DIMEGLUMINE 529 MG/ML IV SOLN COMPARISON:  MRI head 11/09/2017, 08/16/2017 FINDINGS: Brain: Marked interval improvement since the prior MRI of 11/09/2017. Multiple enhancing mass lesions have resolved. Improvement in edema around multiple enhancing lesions compared with the prior study. Small amount of stellate enhancement in the left medial frontal lobe  at the site of prior biopsy. Previously there was masslike enhancement and surrounding edema in this area. This could be related to treatment related enhancement versus residual lymphoma. Continued follow-up suggested. No other abnormal enhancement in the brain. Ventricle size is normal. Biopsy sites in the left anterior frontal lobe and left medial posterior frontal lobe are noted. Negative for acute infarct. Vascular: Normal arterial flow voids Skull and upper cervical spine: No acute skeletal abnormality. Left frontal convexity craniotomy for biopsy. Sinuses/Orbits: Mild mucosal edema paranasal sinuses. Mild mastoid effusion bilaterally. Normal orbit. Other: None IMPRESSION: Marked improvement compared with the prior study of 11/09/2017. Most of the enhancing lesions have completely resolved. There is a small amount of stellate enhancement in the left medial posterior frontal lobe lesion at the site of prior biopsy. This may be related to treatment related enhancement versus residual lymphoma. Continued close follow-up warranted. No new lesions. These results were called by telephone at the time of interpretation on 02/11/2018 at 12:23 pm to Dr. ZCecil Cobbs, who verbally acknowledged these results. Electronically Signed   By: CFranchot GalloM.D.   On: 02/11/2018 12:24     Assessment/Plan  CNS lymphoma ([facility] - Plan: CBC with Differential ([place]), Ambulatory referral to Social Work  Primary CNS lymphoma ([facility]   Mr. SVanecekis clinically stable today.  His MRI demonstrates a complete response to induction chemotherapy.  We had an extensive discussion today regarding future treatment plan.  Although our first line recommendation is to continue with several more cycles of high dose methotrexate and rituxan, the pyschosocial and financial burden of the patient does need to be considered as a quality of life measure.    We discussed the option of moving "early" into consolidation  chemotherapy with single agent temozolomide.  He is agreeable to this with the understanding that imaging will have to be monitored closely, and there is a real possibility of needing to re-engage with HD-MTX with further recurrence given positive response here.    We therefore recommended initiating treatment with Temozolomide 1526mm2, on for five days and off for twenty three days in twenty eight day cycles. The patient will have a complete blood count performed on days 21 and 28 of each cycle, and a comprehensive metabolic panel performed on day 28 of each cycle. Labs may need to be performed more often. Zofran will prescribed for home use for nausea/vomiting.  Side effects were reviewed including fatigue, constipation, cytopenias.  Chemotherapy should be held for the following:  ANC less than 1,000  Platelets less than 100,000  LFT or creatinine greater than 2x ULN  If clinical concerns/contraindications develop    Should con't 562m BID Keppra  We appreciate the opportunity to participate in the care of CRoyal Hawthorn   Should return to clinic in ~5 weeks after completing initial cycle of Temodar.  The total time spent in the encounter was 40 minutes and more than 50% was on counseling and review of test results   ZVentura Sellers MD Medical Director of Neuro-Oncology [facility] [place]10/07/19 3:52 PM

## 2018-02-15 ENCOUNTER — Telehealth: Payer: Self-pay | Admitting: Pharmacist

## 2018-02-15 DIAGNOSIS — C8589 Other specified types of non-Hodgkin lymphoma, extranodal and solid organ sites: Secondary | ICD-10-CM

## 2018-02-15 MED ORDER — TEMOZOLOMIDE 180 MG PO CAPS: ORAL_CAPSULE | ORAL | 0 refills | Status: DC

## 2018-02-15 NOTE — Telephone Encounter (Signed)
Oral Oncology Pharmacist Encounter  Received new prescription for Temodar (temozolomide) for the consolidation treatment phase of primary CNS lymphoma, planned duration until disease progression or unacceptable toxicity.  Patient has received 3 cycles of high-dose methotrexate and experienced positive response to treatment.  Labs from 02/14/2018 assessed, OK for treatment.  Current medication list in Epic reviewed, no DDIs with Temodar identified:  Temodar will be administered at 150 mg/m2 daily x 5 days on, 23 days off, repeated every 28 days  Prescription has been e-scribed to the [employer] for benefits analysis and approval.  Oral Oncology Clinic will continue to follow for insurance authorization, copayment issues, initial counseling and start date.  Johny Drilling, PharmD, BCPS, BCOP  02/15/2018 9:39 AM Oral Oncology Clinic [phone]

## 2018-02-16 ENCOUNTER — Telehealth: Payer: Self-pay

## 2018-02-16 MED ORDER — TEMOZOLOMIDE 180 MG PO CAPS: ORAL_CAPSULE | ORAL | 0 refills | Status: DC

## 2018-02-16 NOTE — Telephone Encounter (Signed)
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  Counseled patient on administration, dosing, side effects, monitoring, drug-food interactions, safe handling, storage, and disposal.  Patient will take Temodar 180mg  capsules, 2 capsules (360 mg total daily dose) by mouth once daily, may take at bedtime and on an empty stomach to decrease nausea and vomiting.  Temodar will be administered daily x 5 days on, 23 days off, repeated every 28 days  Temodar start date: 02/21/2018   Patient will take Zofran 8mg  tablet, 1 tablet by mouth 30-60 min prior to Temodar dose to help decrease N/V.   Adverse effects include but are not limited to: nausea, vomiting, anorexia, GI upset, rash, drug fever, and fatigue.  Reviewed with patient importance of keeping a medication schedule and plan for any missed doses.  Mr. and Mrs. Foti voiced understanding and appreciation.   Mr. Gaunt question whether he would be able to take his camper on a trip towards the end of October. We discussed that he would likely know whether or not he was experiencing any side effects by that time and be able to go on his planned trip.  All questions answered. Medication reconciliation performed and medication/allergy list updated.  Patient will pick up first fill of Temodar from the [facility] tomorrow (02/17/2018) after 2 PM. Temodar claim continues to reject underneath diagnosis code for primary CNS lymphoma when billed to patient's part B Medicare benefits. Temodar will be dispensed at [facility] for $35 per fill. This is affordable to patient at this time.  Patient knows to call the office with questions or concerns. Oral Oncology Clinic will continue to follow.  Johny Drilling, PharmD, BCPS, BCOP   02/16/2018  10:14 AM Oral Oncology Clinic [phone]

## 2018-02-16 NOTE — Progress Notes (Signed)
Brain and Spine Tumor Board Documentation  Tim [name] was presented by Cecil Cobbs, MD at Brain and Spine Tumor Board on 02/16/2018, which included representatives from neuro oncology, radiation oncology, surgical oncology, navigation, pathology, radiology.  Tim [name] was presented as a current patient with history of the following treatments:  .  Additionally, we reviewed previous medical and familial history, history of present illness, and recent lab results along with all available histopathologic and imaging studies. The tumor board considered available treatment options and made the following recommendations:  Chemotherapy    Tumor board is a meeting of clinicians from various specialty areas who evaluate and discuss patients for whom a multidisciplinary approach is being considered. Final determinations in the plan of care are those of the provider(s). The responsibility for follow up of recommendations given during tumor board is that of the provider.   Today's extended care, comprehensive team conference, Tim [name] was not present for the discussion and was not examined.

## 2018-02-16 NOTE — Telephone Encounter (Signed)
Oral Oncology Patient Advocate Encounter  During my benefits investigation I learned that Mr. Depascale does not have Part D insurance, only Medicare A and B. I billed his Temodar to part B and they will not cover C85.89 diagnosis code. I called Part B and they could not provide me with a specific diagnosis code, only that it needed to be more specific. Denyse Amass did some investigating into part B and spoke with [place] this morning who confirmed the diagnosis code to be C85.89. He is considered functionally uninsured and  [employer] can give him [employer] for Temodar, it will cost $35. I also reached out to [facility] and she spoke with the patient about the [place] and he does not qualify. He is willing to pay the $35 at [facility].  [place] Patient [place] Phone [phone] Fax [phone]

## 2018-02-17 MED FILL — TEMOZOLOMIDE 180 MG CAPSULE: 180 | 28 days supply | Qty: 10 | Fill #0

## 2018-02-21 NOTE — Telephone Encounter (Signed)
Oral Oncology Patient Advocate Encounter  Confirmed with [place] that Temodar was picked up on 02/17/18   [place] Patient [place] Phone [phone] Fax [phone]

## 2018-02-23 ENCOUNTER — Encounter: Payer: Self-pay | Admitting: *Deleted

## 2018-02-23 NOTE — Progress Notes (Addendum)
[place] Work  Clinical Social Work was referred by neurooncology for assessment of psychosocial needs.  Clinical Social Worker contacted patient by phone  to offer support and assess for needs.  CSW left voicemail to return call.   1542: Patient contacted CSW by phone and shared concerns regarding medical bills and lack of Medicare supplement.  CSW provided information for [facility], [facility] and Patient [place].  CSW and patient discussed possible resources.  CSW explored patient's feelings surrounded diagnosis and ongoing treatment.  CSW encouraged patient or spouse to call with any questions or concerns.   Gwinda Maine, LCSW  Clinical Social Worker [facility]

## 2018-03-21 ENCOUNTER — Inpatient Hospital Stay (HOSPITAL_BASED_OUTPATIENT_CLINIC_OR_DEPARTMENT_OTHER): Payer: Medicare Other | Admitting: Internal Medicine

## 2018-03-21 ENCOUNTER — Inpatient Hospital Stay: Payer: Medicare Other

## 2018-03-21 ENCOUNTER — Telehealth: Payer: Self-pay

## 2018-03-21 ENCOUNTER — Inpatient Hospital Stay: Payer: Medicare Other | Attending: Internal Medicine

## 2018-03-21 VITALS — BP 136/73 | HR 60 | Temp 97.6°F | Resp 17 | Ht 75.0 in | Wt 255.5 lb

## 2018-03-21 DIAGNOSIS — Z95828 Presence of other vascular implants and grafts: Secondary | ICD-10-CM

## 2018-03-21 DIAGNOSIS — Z79899 Other long term (current) drug therapy: Secondary | ICD-10-CM | POA: Diagnosis not present

## 2018-03-21 DIAGNOSIS — C8589 Other specified types of non-Hodgkin lymphoma, extranodal and solid organ sites: Secondary | ICD-10-CM

## 2018-03-21 DIAGNOSIS — Z87891 Personal history of nicotine dependence: Secondary | ICD-10-CM | POA: Insufficient documentation

## 2018-03-21 LAB — CMP (CANCER CENTER ONLY)
ALBUMIN: 3.6 g/dL (ref 3.5–5.0)
ALT: 30 U/L (ref 0–44)
AST: 30 U/L (ref 15–41)
Alkaline Phosphatase: 85 U/L (ref 38–126)
Anion gap: 7 (ref 5–15)
BUN: 10 mg/dL (ref 8–23)
CHLORIDE: 107 mmol/L (ref 98–111)
CO2: 30 mmol/L (ref 22–32)
CREATININE: 0.77 mg/dL (ref 0.61–1.24)
Calcium: 9.1 mg/dL (ref 8.9–10.3)
GFR, Est AFR Am: >60 mL/min (ref >60)
Glucose, Bld: 144 mg/dL — ABNORMAL HIGH (ref 70–99)
POTASSIUM: 4.6 mmol/L (ref 3.5–5.1)
Sodium: 144 mmol/L (ref 135–145)
Total Bilirubin: 0.3 mg/dL (ref 0.3–1.2)
Total Protein: 6.3 g/dL — ABNORMAL LOW (ref 6.5–8.1)

## 2018-03-21 LAB — CBC (CANCER CENTER ONLY)
HEMATOCRIT: 48.9 % (ref 39.0–52.0)
Hemoglobin: 16.2 g/dL (ref 13.0–17.0)
MCH: 33.8 pg (ref 26.0–34.0)
MCHC: 33.1 g/dL (ref 30.0–36.0)
MCV: 101.9 fL — AB (ref 80.0–100.0)
NRBC: 0 % (ref 0.0–0.2)
PLATELETS: 143 10*3/uL — AB (ref 150–400)
RBC: 4.8 MIL/uL (ref 4.22–5.81)
RDW: 12 % (ref 11.5–15.5)
WBC: 6 10*3/uL (ref 4.0–10.5)

## 2018-03-21 MED ORDER — HEPARIN SOD (PORK) LOCK FLUSH 100 UNIT/ML IV SOLN
500.0000 [IU] | Freq: Once | INTRAVENOUS | Status: AC
  Administered 2018-03-21: 500 [IU]
  Filled 2018-03-21: qty 5

## 2018-03-21 MED ORDER — TEMOZOLOMIDE 250 MG PO CAPS: ORAL_CAPSULE | ORAL | 0 refills | Status: DC

## 2018-03-21 MED ORDER — SODIUM CHLORIDE 0.9% FLUSH
10.0000 mL | Freq: Once | INTRAVENOUS | Status: AC
  Administered 2018-03-21: 10 mL
  Filled 2018-03-21: qty 10

## 2018-03-21 MED ORDER — TEMOZOLOMIDE 180 MG PO CAPS: ORAL_CAPSULE | ORAL | 0 refills | Status: DC

## 2018-03-21 NOTE — Telephone Encounter (Signed)
Oral Oncology Patient Advocate Encounter  I received a call from [place] at the [facility] stating that the price of the Temodar had gone up and now the patient has 2 separate Temodar prescriptions. 180mg  tablets #5 will cost $62.55 and the 250mg  tablets #5 will cost $71.60.   I called the patient to let him know about the pricing and he wants to talk to [place] about the medicine before he makes plans to pick up the medicine. I assured him I would pass the message along and Denyse Amass would contact him as soon as she was able.  [place] Patient [place] Phone [phone] Fax [phone]

## 2018-03-21 NOTE — Progress Notes (Signed)
[facility] at [address], [place] [address] [phone]   Interval Evaluation  Date of Service: 03/21/18 Patient Name: Tim [name] Patient MRN: [id number] Patient DOB: 11/19/1955 Provider: Ventura Sellers, MD  Identifying Statement:  Tim [name] is a 62 y.o. male with left frontal CNS lymphoma  Interval History:  Tim [name] presents today for follow up after completing first cycle of 5-day temodar.  No issues with temodar aside from fatigue week of treatment and mild GI upsest.  No new or progressive neurologic deficits.  Port still in place.    Medications: Current Outpatient Medications on File Prior to Visit  Medication Sig Dispense Refill  . albuterol (PROVENTIL HFA;VENTOLIN HFA) 108 (90 Base) MCG/ACT inhaler Inhale 2 puffs into the lungs every 4 (four) hours as needed for wheezing or shortness of breath. (Patient not taking: Reported on 02/14/2018) 1 Inhaler 0  . allopurinol (ZYLOPRIM) 300 MG tablet Take 300 mg by mouth daily.    Marland Kitchen atenolol (TENORMIN) 100 MG tablet Take 50 mg by mouth daily.    Marland Kitchen b complex vitamins tablet Take 1 tablet by mouth daily.    . blood glucose meter kit and supplies KIT Dispense based on patient and insurance preference. Use up to four times daily as directed. (FOR ICD-9 250.00, 250.01). 1 each 0  . budesonide-formoterol (SYMBICORT) 160-4.5 MCG/ACT inhaler Take 2 puffs first thing in am and then another 2 puffs about 12 hours later. (Patient taking differently: Inhale 2 puffs into the lungs 2 (two) times daily. ) 1 Inhaler 12  . Insulin Isophane & Regular Human (NOVOLIN 70/30 FLEXPEN) (70-30) 100 UNIT/ML PEN Inject 15 Units into the skin 2 (two) times daily. 15 mL 11  . Insulin Pen Needle (PEN NEEDLES 3/16") 31G X 5 MM MISC 1 Container by Does not apply route 2 (two) times daily. 100 each 0  . levETIRAcetam (KEPPRA) 500 MG tablet Take 1 tablet (500 mg total) by mouth 2 (two) times  daily. 60 tablet 5  . Multiple Vitamin (MULTIVITAMIN) tablet Take 1 tablet by mouth daily.    . ondansetron (ZOFRAN) 8 MG tablet Take 1 tablet (8 mg total) by mouth 2 (two) times daily as needed. Start on the third day after chemotherapy. 30 tablet 1  . temozolomide (TEMODAR) 180 MG capsule Take 2 capsules (343m) by mouth for 5 days on, 23 days off, repeated every 28 days. May take on empty stomach at bedtime to reduce N/V 10 capsule 0   Current Facility-Administered Medications on File Prior to Visit  Medication Dose Route Frequency Provider Last Rate Last Dose  . heparin lock flush 100 unit/mL  500 Units Intracatheter Once VVentura Sellers MD      . sodium chloride flush (NS) 0.9 % injection 10 mL  10 mL Intracatheter Once VVentura Sellers MD        Allergies: No Known Allergies   Past Medical History:  Past Medical History:  Diagnosis Date  . Arthritis   . Brain tumor ([facility]   . [facility] [place]   . Diabetes mellitus without complication ([facility]    patient states he was taken off meds at last appointment with PCP and is diet controlled  . Dyspnea   . Gout   . Hypertension 08/02/2017  . Pneumonia    Past Surgical History: none    Social History:  Social History   Socioeconomic History  . Marital  status: Significant Other    Spouse name: [name]  . Number of children: Not on file  . Years of education: 29  . Highest education level: Bachelor's degree (e.g., BA, AB, BS)  Occupational History  . 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 Financial resource strain: Not hard at all  . Food insecurity:    Worry: Never true    Inability: Never true  . Transportation needs:    Medical: No    Non-medical: No  Tobacco Use  . Smoking status: Former Smoker    Packs/day: 2.00    Years: 40.00    Pack years: 80.00    Types: Cigarettes    Last attempt to quit: 08/10/2011    Years since quitting: 6.6  .  Smokeless tobacco: Never Used  Substance and Sexual Activity  . Alcohol use: Yes    Alcohol/week: 14.0 standard drinks    Types: 14 Cans of beer per week    Frequency: Never  . Drug use: Never  . Sexual activity: Not Currently  Lifestyle  . Physical activity:    Days per week: 0 days    Minutes per session: 0 min  . Stress: Only a little  Relationships  . Social connections:    Talks on phone: Once a week    Gets together: Once a week    Attends religious service: Never    Active member of club or organization: No    Attends meetings of clubs or organizations: Never    Relationship status: Living with partner  . Intimate partner violence:    Fear of current or ex partner: No    Emotionally abused: No    Physically abused: No    Forced sexual activity: No  Other Topics Concern  . Not on file  Social History Narrative  . Not on file   Family History:  Family History  Problem Relation Age of Onset  . Cancer Brother     Review of Systems: Constitutional: Denies fevers, chills or abnormal weight loss Eyes: Denies blurriness of vision Ears, nose, mouth, throat, and face: Denies mucositis or sore throat Respiratory: denies sob Cardiovascular: Denies palpitation, chest discomfort or lower extremity swelling Gastrointestinal:  Denies nausea, constipation, diarrhea GU: Denies dysuria or incontinence Skin: Denies abnormal skin rashes Neurological: Per HPI Musculoskeletal: Denies joint pain, back or neck discomfort. No decrease in ROM Behavioral/Psych: Denies anxiety, disturbance in thought content, and mood instability  Physical Exam: Vitals:   03/21/18 1237  BP: 136/73  Pulse: 60  Resp: 17  Temp: 97.6 F (36.4 C)  SpO2: 93%   KPS: 80. General: Alert, cooperative, pleasant, in no acute distress Head: Biopsy scar noted, dry and intact. EENT: No conjunctival injection or scleral icterus. Oral mucosa moist Lungs: Resp effort normal Cardiac: Regular rate and  rhythm Abdomen: Soft, non-distended abdomen Skin: No rashes cyanosis or petechiae. Extremities: No clubbing or edema  Neurologic Exam: Mental Status: Awake, alert, attentive to examiner. Oriented to self and environment. Language fluent with intact comprehension, repetition, reading. Cranial Nerves: Visual acuity is grossly normal. Visual fields are full. Extra-ocular movements intact. No ptosis. Face is symmetric, tongue midline. Motor: Tone and bulk are normal. Power 4+/5 in right arm and leg, subtle hip girdle weakness. Reflexes are symmetric, no pathologic reflexes present. Intact finger to nose bilaterally Sensory: Intact to light touch and temperature Gait: Normal, independent   Labs: I have reviewed the data as listed  Component Value Date/Time   NA 144 03/21/2018 1148   K 4.6 03/21/2018 1148   CL 107 03/21/2018 1148   CO2 30 03/21/2018 1148   GLUCOSE 144 (H) 03/21/2018 1148   BUN 10 03/21/2018 1148   CREATININE 0.77 03/21/2018 1148   CALCIUM 9.1 03/21/2018 1148   PROT 6.3 (L) 03/21/2018 1148   ALBUMIN 3.6 03/21/2018 1148   AST 30 03/21/2018 1148   ALT 30 03/21/2018 1148   ALKPHOS 85 03/21/2018 1148   BILITOT 0.3 03/21/2018 1148   GFRNONAA >60 03/21/2018 1148   GFRAA >60 03/21/2018 1148   Lab Results  Component Value Date   WBC 6.0 03/21/2018   NEUTROABS 3.8 02/14/2018   HGB 16.2 03/21/2018   HCT 48.9 03/21/2018   MCV 101.9 (H) 03/21/2018   PLT 143 (L) 03/21/2018     Assessment/Plan  Primary CNS lymphoma [facility])   Mr. Pullara is clinically stable today.    We recommended continuing treatment with second cycle of Temozolomide, increased to 245m/m2, on for five days and off for twenty three days in twenty eight day cycles. The patient will have a complete blood count performed on days 21 and 28 of each cycle, and a comprehensive metabolic panel performed on day 28 of each cycle. Labs may need to be performed more often. Zofran will prescribed for home use for  nausea/vomiting.  Side effects were reviewed including fatigue, constipation, cytopenias.  Chemotherapy should be held for the following:  ANC less than 1,000  Platelets less than 100,000  LFT or creatinine greater than 2x ULN  If clinical concerns/contraindications develop    Should con't 5010mBID Keppra and remain off steroids.  We appreciate the opportunity to participate in the care of Tim [name]  Should return to clinic in 1 month with MRI for evaluation.  The total time spent in the encounter was 25 minutes and more than 50% was on counseling and review of test results   ZaVentura SellersMD Medical Director of Neuro-Oncology [facility] We[facility]1/11/19 12:41 PM

## 2018-03-22 ENCOUNTER — Telehealth: Payer: Self-pay

## 2018-03-22 MED FILL — TEMOZOLOMIDE 250 MG CAPS: 250 | 28 days supply | Qty: 5 | Fill #0

## 2018-03-22 MED FILL — TEMOZOLOMIDE 180 MG CAPSULE: 180 | 28 days supply | Qty: 5 | Fill #0

## 2018-03-22 NOTE — Telephone Encounter (Signed)
Per 11/11 los . Left a voice message concerning upcoming appointments. Will mail a letter with a calender enclosed of this appointment.

## 2018-03-22 NOTE — Telephone Encounter (Signed)
Oral Oncology Patient Advocate Encounter  [employer] is no longer dispensing Temodar on their patient assistance program. Tim [name] had me ask the pharmacy how much 10 tablets of the 180mg  would be and that is $120.05.  I called the patient this morning and he decided he would go ahead and pick up the 180mg  #5 tablets and 250mg  #5 tablets and pay the $134.15 total for both today (03/22/18)  Tim [name] Patient [place] Phone [phone] Fax [phone]

## 2018-04-13 ENCOUNTER — Other Ambulatory Visit: Payer: Self-pay | Admitting: Radiation Therapy

## 2018-04-15 ENCOUNTER — Ambulatory Visit
Admission: RE | Admit: 2018-04-15 | Discharge: 2018-04-15 | Disposition: A | Discharge status: Home / Self Care | Payer: Medicare Other | Source: Ambulatory Visit | Attending: Internal Medicine | Admitting: Internal Medicine

## 2018-04-15 DIAGNOSIS — C8589 Other specified types of non-Hodgkin lymphoma, extranodal and solid organ sites: Secondary | ICD-10-CM

## 2018-04-15 MED ORDER — GADOBENATE DIMEGLUMINE 529 MG/ML IV SOLN
20.0000 mL | Freq: Once | INTRAVENOUS | Status: AC | PRN
  Administered 2018-04-15: 20 mL via INTRAVENOUS

## 2018-04-18 ENCOUNTER — Inpatient Hospital Stay: Payer: Medicare Other

## 2018-04-18 ENCOUNTER — Telehealth: Payer: Self-pay

## 2018-04-18 ENCOUNTER — Other Ambulatory Visit: Payer: Medicare Other

## 2018-04-18 ENCOUNTER — Inpatient Hospital Stay: Payer: Medicare Other | Attending: Internal Medicine | Admitting: Internal Medicine

## 2018-04-18 VITALS — BP 137/70 | HR 62 | Temp 97.7°F | Resp 18 | Ht 75.0 in | Wt 259.7 lb

## 2018-04-18 DIAGNOSIS — Z87891 Personal history of nicotine dependence: Secondary | ICD-10-CM | POA: Diagnosis not present

## 2018-04-18 DIAGNOSIS — Z95828 Presence of other vascular implants and grafts: Secondary | ICD-10-CM

## 2018-04-18 DIAGNOSIS — C8589 Other specified types of non-Hodgkin lymphoma, extranodal and solid organ sites: Secondary | ICD-10-CM

## 2018-04-18 DIAGNOSIS — R5383 Other fatigue: Secondary | ICD-10-CM

## 2018-04-18 LAB — CBC WITH DIFFERENTIAL/PLATELET
Abs Immature Granulocytes: 0.01 10*3/uL (ref 0.00–0.07)
Basophils Absolute: 0 10*3/uL (ref 0.0–0.1)
Basophils Relative: 0 %
EOS PCT: 4 %
Eosinophils Absolute: 0.2 10*3/uL (ref 0.0–0.5)
HCT: 50.5 % (ref 39.0–52.0)
Hemoglobin: 16.8 g/dL (ref 13.0–17.0)
Immature Granulocytes: 0 %
Lymphocytes Relative: 25 %
Lymphs Abs: 1.6 10*3/uL (ref 0.7–4.0)
MCH: 32.9 pg (ref 26.0–34.0)
MCHC: 33.3 g/dL (ref 30.0–36.0)
MCV: 99 fL (ref 80.0–100.0)
Monocytes Absolute: 0.8 10*3/uL (ref 0.1–1.0)
Monocytes Relative: 13 %
NRBC: 0 % (ref 0.0–0.2)
Neutro Abs: 3.7 10*3/uL (ref 1.7–7.7)
Neutrophils Relative %: 58 %
Platelets: 109 10*3/uL — ABNORMAL LOW (ref 150–400)
RBC: 5.1 MIL/uL (ref 4.22–5.81)
RDW: 11.9 % (ref 11.5–15.5)
WBC: 6.4 10*3/uL (ref 4.0–10.5)

## 2018-04-18 LAB — CMP (CANCER CENTER ONLY)
ALT: 31 U/L (ref 0–44)
AST: 32 U/L (ref 15–41)
Albumin: 3.7 g/dL (ref 3.5–5.0)
Alkaline Phosphatase: 92 U/L (ref 38–126)
Anion gap: 10 (ref 5–15)
BUN: 9 mg/dL (ref 8–23)
CO2: 30 mmol/L (ref 22–32)
Calcium: 9.2 mg/dL (ref 8.9–10.3)
Chloride: 104 mmol/L (ref 98–111)
Creatinine: 0.78 mg/dL (ref 0.61–1.24)
GFR, Est AFR Am: >60 mL/min (ref >60)
GFR, Estimated: >60 mL/min (ref >60)
Glucose, Bld: 125 mg/dL — ABNORMAL HIGH (ref 70–99)
Potassium: 4.5 mmol/L (ref 3.5–5.1)
Sodium: 144 mmol/L (ref 135–145)
Total Bilirubin: 0.3 mg/dL (ref 0.3–1.2)
Total Protein: 6.6 g/dL (ref 6.5–8.1)

## 2018-04-18 MED ORDER — TEMOZOLOMIDE 250 MG PO CAPS: ORAL_CAPSULE | ORAL | 0 refills | Status: DC

## 2018-04-18 MED ORDER — TEMOZOLOMIDE 180 MG PO CAPS: ORAL_CAPSULE | ORAL | 0 refills | Status: DC

## 2018-04-18 MED FILL — TEMOZOLOMIDE 250 MG CAPS: 250 | 28 days supply | Qty: 5 | Fill #0

## 2018-04-18 MED FILL — TEMOZOLOMIDE 180 MG CAPSULE: 180 | 28 days supply | Qty: 5 | Fill #0

## 2018-04-18 NOTE — Progress Notes (Signed)
[facility] at [address], [place] [address] [phone]   Interval Evaluation  Date of Service: 04/18/18 Patient Name: Tim [name] Patient MRN: [id number] Patient DOB: 07-Feb-1956 Provider: Ventura Sellers, MD  Identifying Statement:  Tim [name] is a 62 y.o. male with left frontal CNS lymphoma  Interval History:  Tim [name] presents today for follow up after completing second cycle of 5-day temodar.  Fatigue was increased with the 280m/m2 dose level compared to last month.  No new or progressive neurologic deficits.  Port still in place.    Medications: Current Outpatient Medications on File Prior to Visit  Medication Sig Dispense Refill  . albuterol (PROVENTIL HFA;VENTOLIN HFA) 108 (90 Base) MCG/ACT inhaler Inhale 2 puffs into the lungs every 4 (four) hours as needed for wheezing or shortness of breath. (Patient not taking: Reported on 02/14/2018) 1 Inhaler 0  . allopurinol (ZYLOPRIM) 300 MG tablet Take 300 mg by mouth daily.    .Marland Kitchenatenolol (TENORMIN) 100 MG tablet Take 50 mg by mouth daily.    .Marland Kitchenb complex vitamins tablet Take 1 tablet by mouth daily.    . blood glucose meter kit and supplies KIT Dispense based on patient and insurance preference. Use up to four times daily as directed. (FOR ICD-9 250.00, 250.01). 1 each 0  . budesonide-formoterol (SYMBICORT) 160-4.5 MCG/ACT inhaler Take 2 puffs first thing in am and then another 2 puffs about 12 hours later. (Patient taking differently: Inhale 2 puffs into the lungs 2 (two) times daily. ) 1 Inhaler 12  . Insulin Isophane & Regular Human (NOVOLIN 70/30 FLEXPEN) (70-30) 100 UNIT/ML PEN Inject 15 Units into the skin 2 (two) times daily. 15 mL 11  . Insulin Pen Needle (PEN NEEDLES 3/16") 31G X 5 MM MISC 1 Container by Does not apply route 2 (two) times daily. 100 each 0  . levETIRAcetam (KEPPRA) 500 MG tablet Take 1 tablet (500 mg total) by mouth 2 (two) times  daily. 60 tablet 5  . Multiple Vitamin (MULTIVITAMIN) tablet Take 1 tablet by mouth daily.    . ondansetron (ZOFRAN) 8 MG tablet Take 1 tablet (8 mg total) by mouth 2 (two) times daily as needed. Start on the third day after chemotherapy. 30 tablet 1  . temozolomide (TEMODAR) 180 MG capsule Take 2 capsules (3658m by mouth for 5 days on, 23 days off, repeated every 28 days. May take on empty stomach at bedtime to reduce N/V 10 capsule 0  . temozolomide (TEMODAR) 180 MG capsule Take 1 capsules (18062mby mouth for 5 days on, 23 days off, repeated every 28 days. May take on empty stomach at bedtime to reduce N/V 5 capsule 0  . temozolomide (TEMODAR) 250 MG capsule Take 1 capsules (250m60my mouth for 5 days on, 23 days off, repeated every 28 days. May take on empty stomach at bedtime to reduce N/V 5 capsule 0   Current Facility-Administered Medications on File Prior to Visit  Medication Dose Route Frequency Provider Last Rate Last Dose  . heparin lock flush 100 unit/mL  500 Units Intracatheter Once VaslVentura Sellers      . sodium chloride flush (NS) 0.9 % injection 10 mL  10 mL Intracatheter Once VaslVentura Sellers        Allergies: No Known Allergies   Past Medical History:  Past Medical History:  Diagnosis Date  . Arthritis   . Brain tumor (HCC)[place].  COPD (chronic obstructive pulmonary disease) ([place])   . Diabetes mellitus without complication [facility])    patient states he was taken off meds at last appointment with PCP and is diet controlled  . Dyspnea   . Gout   . Hypertension 08/02/2017  . Pneumonia    Past Surgical History: none    Social History:  Social History   Socioeconomic History  . Marital status: Significant Other    Spouse name: [name]  . Number of children: Not on file  . Years of education: 20  . Highest education level: Bachelor's degree (e.g., BA, AB, BS)  Occupational History  . 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  Financial resource strain: Not hard at all  . Food insecurity:    Worry: Never true    Inability: Never true  . Transportation needs:    Medical: No    Non-medical: No  Tobacco Use  . Smoking status: Former Smoker    Packs/day: 2.00    Years: 40.00    Pack years: 80.00    Types: Cigarettes    Last attempt to quit: 08/10/2011    Years since quitting: 6.6  . Smokeless tobacco: Never Used  Substance and Sexual Activity  . Alcohol use: Yes    Alcohol/week: 14.0 standard drinks    Types: 14 Cans of beer per week    Frequency: Never  . Drug use: Never  . Sexual activity: Not Currently  Lifestyle  . Physical activity:    Days per week: 0 days    Minutes per session: 0 min  . Stress: Only a little  Relationships  . Social connections:    Talks on phone: Once a week    Gets together: Once a week    Attends religious service: Never    Active member of club or organization: No    Attends meetings of clubs or organizations: Never    Relationship status: Living with partner  . Intimate partner violence:    Fear of current or ex partner: No    Emotionally abused: No    Physically abused: No    Forced sexual activity: No  Other Topics Concern  . Not on file  Social History Narrative  . Not on file   Family History:  Family History  Problem Relation Age of Onset  . Cancer Brother     Review of Systems: Constitutional: Denies fevers, chills or abnormal weight loss Eyes: Denies blurriness of vision Ears, nose, mouth, throat, and face: Denies mucositis or sore throat Respiratory: denies sob Cardiovascular: Denies palpitation, chest discomfort or lower extremity swelling Gastrointestinal:  Denies nausea, constipation, diarrhea GU: Denies dysuria or incontinence Skin: Denies abnormal skin rashes Neurological: Per HPI Musculoskeletal: Denies joint pain, back or neck discomfort. No decrease in ROM Behavioral/Psych: Denies anxiety, disturbance in thought content, and mood  instability  Physical Exam: Vitals:   04/18/18 1144  BP: 137/70  Pulse: 62  Resp: 18  Temp: 97.7 F (36.5 C)  SpO2: 93%   KPS: 80. General: Alert, cooperative, pleasant, in no acute distress Head: Biopsy scar noted, dry and intact. EENT: No conjunctival injection or scleral icterus. Oral mucosa moist Lungs: Resp effort normal Cardiac: Regular rate and rhythm Abdomen: Soft, non-distended abdomen Skin: No rashes cyanosis or petechiae. Extremities: No clubbing or edema  Neurologic Exam: Mental Status: Awake, alert, attentive to examiner. Oriented to self and environment. Language fluent with intact comprehension, repetition, reading. Cranial  Nerves: Visual acuity is grossly normal. Visual fields are full. Extra-ocular movements intact. No ptosis. Face is symmetric, tongue midline. Motor: Tone and bulk are normal. Power 4+/5 in right arm and leg, subtle hip girdle weakness. Reflexes are symmetric, no pathologic reflexes present. Intact finger to nose bilaterally Sensory: Intact to light touch and temperature Gait: Normal, independent   Labs: I have reviewed the data as listed    Component Value Date/Time   NA 144 04/18/2018 1107   K 4.5 04/18/2018 1107   CL 104 04/18/2018 1107   CO2 30 04/18/2018 1107   GLUCOSE 125 (H) 04/18/2018 1107   BUN 9 04/18/2018 1107   CREATININE 0.78 04/18/2018 1107   CALCIUM 9.2 04/18/2018 1107   PROT 6.6 04/18/2018 1107   ALBUMIN 3.7 04/18/2018 1107   AST 32 04/18/2018 1107   ALT 31 04/18/2018 1107   ALKPHOS 92 04/18/2018 1107   BILITOT 0.3 04/18/2018 1107   GFRNONAA >60 04/18/2018 1107   GFRAA >60 04/18/2018 1107   Lab Results  Component Value Date   WBC 6.4 04/18/2018   NEUTROABS 3.7 04/18/2018   HGB 16.8 04/18/2018   HCT 50.5 04/18/2018   MCV 99.0 04/18/2018   PLT 109 (L) 04/18/2018    Imaging:  [facility] Clinician Interpretation: I have personally reviewed the CNS images as listed.  My interpretation, in the context of the  patient's clinical presentation, is stable disease  Mr Jeri Cos Wo Contrast  Result Date: 04/15/2018 CLINICAL DATA:  61 year old male with CNS lymphoma (suspected on imaging March/April is year and biopsied in July) status post treatment. Restaging. EXAM: MRI HEAD WITHOUT AND WITH CONTRAST TECHNIQUE: Multiplanar, multiecho pulse sequences of the brain and surrounding structures were obtained without and with intravenous contrast. CONTRAST:  10m MULTIHANCE GADOBENATE DIMEGLUMINE 529 MG/ML IV SOLN COMPARISON:  Brain MRI 02/11/2018 and earlier. FINDINGS: Brain: Mild hemosiderin and encephalomalacia in the left superior frontal gyrus compatible with post biopsy sequelae. No abnormal enhancement in this region. Regional FLAIR hyperintensity is stable without mass effect. Less pronounced post biopsy changes in the more anterior superior frontal gyrus on series 11, image 21 with no enhancement and only trace regional FLAIR hyperintensity. Multiple other sites of abnormal enhancement have resolved in the right parietal lobe, left column of the fornix, medial right thalamus, and left dorsal midbrain. There is only subtle volume loss and T2/FLAIR hyperintensity at some of the sites (midbrain on series 11, image 13, stable since October. No abnormal parenchymal enhancement today. No dural thickening. No new signal abnormality. No restricted diffusion to suggest acute infarction. No midline shift, mass effect, ventriculomegaly, extra-axial collection or acute intracranial hemorrhage. Cervicomedullary junction and pituitary are within normal limits. No other chronic cerebral blood products. Vascular: Major intracranial vascular flow voids are stable, with mild intracranial artery tortuosity. The major dural venous sinuses are enhancing and appear patent. Skull and upper cervical spine: Negative visible cervical spine and spinal cord. Visualized bone marrow signal is within normal limits. Sinuses/Orbits: Negative orbits.  Stable mild maxillary sinus mucosal thickening. Other: Stable trace mastoid fluid. Visible internal auditory structures appear normal. Mild scalp post biopsy sequelae. IMPRESSION: 1. Regressed CNS Lymphoma. Resolved multifocal abnormal enhancement since July. Stable residual FLAIR hyperintensity in the left superior frontal gyrus which might in part be post biopsy related. 2. No new signal abnormality or new intracranial abnormality. Electronically Signed   By: HGenevie AnnM.D.   On: 04/15/2018 15:00    Assessment/Plan  Primary CNS lymphoma ([facility]   Tim [name] is clinically and radiographically stable today.    We recommended continuing treatment with third cycle of Temozolomide 247m/m2, on for five days and off for twenty three days in twenty eight day cycles. The patient will have a complete blood count performed on days 21 and 28 of each cycle, and a comprehensive metabolic panel performed on day 28 of each cycle. Labs may need to be performed more often. Zofran will prescribed for home use for nausea/vomiting.  Side effects were reviewed including fatigue, constipation, cytopenias.  Chemotherapy should be held for the following:  ANC less than 1,000  Platelets less than 100,000  LFT or creatinine greater than 2x ULN  If clinical concerns/contraindications develop    Should con't 5080mBID Keppra  We appreciate the opportunity to participate in the care of Tim [name]  Should return to clinic in 5 weeks with labs for evaluation.  The total time spent in the encounter was 25 minutes and more than 50% was on counseling and review of test results   ZaVentura SellersMD Medical Director of Neuro-Oncology [facility] We[facility]land2/09/19 12:00 PM

## 2018-04-18 NOTE — Telephone Encounter (Signed)
Printed avs and calender of upcoming appointment. Per 1/29 los 

## 2018-04-22 NOTE — Progress Notes (Signed)
Brain and Spine Tumor Board Documentation  HAYDAN MANSOURI was presented by Cecil Cobbs, MD at Brain and Spine Tumor Board on 04/22/2018, which included representatives from neuro oncology, radiation oncology, surgical oncology, navigation, pathology, radiology.  Ramil was presented as a current patient with history of the following treatments:  .  Additionally, we reviewed previous medical and familial history, history of present illness, and recent lab results along with all available histopathologic and imaging studies. The tumor board considered available treatment options and made the following recommendations:    temozolomide  Tumor board is a meeting of clinicians from various specialty areas who evaluate and discuss patients for whom a multidisciplinary approach is being considered. Final determinations in the plan of care are those of the provider(s). The responsibility for follow up of recommendations given during tumor board is that of the provider.   Today's extended care, comprehensive team conference, Sahas was not present for the discussion and was not examined.

## 2018-05-23 ENCOUNTER — Encounter: Payer: Self-pay | Admitting: Internal Medicine

## 2018-05-23 ENCOUNTER — Inpatient Hospital Stay: Payer: PPO

## 2018-05-23 ENCOUNTER — Inpatient Hospital Stay: Payer: PPO | Attending: Internal Medicine | Admitting: Internal Medicine

## 2018-05-23 ENCOUNTER — Telehealth: Payer: Self-pay | Admitting: Internal Medicine

## 2018-05-23 VITALS — BP 142/74 | HR 60 | Temp 97.9°F | Resp 18 | Ht 75.0 in | Wt 261.0 lb

## 2018-05-23 DIAGNOSIS — C8589 Other specified types of non-Hodgkin lymphoma, extranodal and solid organ sites: Secondary | ICD-10-CM | POA: Diagnosis not present

## 2018-05-23 DIAGNOSIS — Z87891 Personal history of nicotine dependence: Secondary | ICD-10-CM

## 2018-05-23 DIAGNOSIS — C718 Malignant neoplasm of overlapping sites of brain: Secondary | ICD-10-CM | POA: Insufficient documentation

## 2018-05-23 DIAGNOSIS — Z95828 Presence of other vascular implants and grafts: Secondary | ICD-10-CM

## 2018-05-23 LAB — CMP (CANCER CENTER ONLY)
ALT: 37 U/L (ref 0–44)
AST: 40 U/L (ref 15–41)
Albumin: 3.9 g/dL (ref 3.5–5.0)
Alkaline Phosphatase: 103 U/L (ref 38–126)
Anion gap: 6 (ref 5–15)
BUN: 14 mg/dL (ref 8–23)
CALCIUM: 9.1 mg/dL (ref 8.9–10.3)
CO2: 29 mmol/L (ref 22–32)
Chloride: 105 mmol/L (ref 98–111)
Creatinine: 0.77 mg/dL (ref 0.61–1.24)
GFR, Est AFR Am: >60 mL/min (ref >60)
Glucose, Bld: 128 mg/dL — ABNORMAL HIGH (ref 70–99)
Potassium: 4.4 mmol/L (ref 3.5–5.1)
Sodium: 140 mmol/L (ref 135–145)
Total Bilirubin: 0.6 mg/dL (ref 0.3–1.2)
Total Protein: 6.8 g/dL (ref 6.5–8.1)

## 2018-05-23 LAB — CBC (CANCER CENTER ONLY)
HCT: 46.9 % (ref 39.0–52.0)
Hemoglobin: 15.9 g/dL (ref 13.0–17.0)
MCH: 32.1 pg (ref 26.0–34.0)
MCHC: 33.9 g/dL (ref 30.0–36.0)
MCV: 94.6 fL (ref 80.0–100.0)
NRBC: 0 % (ref 0.0–0.2)
Platelet Count: 114 10*3/uL — ABNORMAL LOW (ref 150–400)
RBC: 4.96 MIL/uL (ref 4.22–5.81)
RDW: 12.8 % (ref 11.5–15.5)
WBC: 5.8 10*3/uL (ref 4.0–10.5)

## 2018-05-23 MED ORDER — SODIUM CHLORIDE 0.9% FLUSH
10.0000 mL | Freq: Once | INTRAVENOUS | Status: AC
  Administered 2018-05-23: 10 mL
  Filled 2018-05-23: qty 10

## 2018-05-23 MED ORDER — TEMOZOLOMIDE 180 MG PO CAPS: ORAL_CAPSULE | ORAL | 0 refills | Status: DC

## 2018-05-23 MED ORDER — HEPARIN SOD (PORK) LOCK FLUSH 100 UNIT/ML IV SOLN
500.0000 [IU] | Freq: Once | INTRAVENOUS | Status: AC
  Administered 2018-05-23: 500 [IU]
  Filled 2018-05-23: qty 5

## 2018-05-23 MED ORDER — TEMOZOLOMIDE 250 MG PO CAPS: ORAL_CAPSULE | ORAL | 0 refills | Status: DC

## 2018-05-23 NOTE — Telephone Encounter (Signed)
Printed calendar and avs. °

## 2018-05-23 NOTE — Progress Notes (Addendum)
[facility], [place] [address] [phone]   Interval Evaluation  Date of Service: 05/23/18 Patient Name: Tim [name] Patient MRN: [id number] Patient DOB: 1955-06-15 Provider: Ventura Sellers, MD  Identifying Statement:  Tim [name] is a 63 y.o. male with left frontal CNS lymphoma  Interval History:  Tim [name] presents today for follow up after completing third cycle of 5-day temodar.  Fatigue was stable compared to last month.  No new or progressive neurologic deficits.  Port still in place.    Medications: Current Outpatient Medications on File Prior to Visit  Medication Sig Dispense Refill  . albuterol (PROVENTIL HFA;VENTOLIN HFA) 108 (90 Base) MCG/ACT inhaler Inhale 2 puffs into the lungs every 4 (four) hours as needed for wheezing or shortness of breath. (Patient not taking: Reported on 02/14/2018) 1 Inhaler 0  . allopurinol (ZYLOPRIM) 300 MG tablet Take 300 mg by mouth daily.    Marland Kitchen atenolol (TENORMIN) 100 MG tablet Take 50 mg by mouth daily.    Marland Kitchen b complex vitamins tablet Take 1 tablet by mouth daily.    . blood glucose meter kit and supplies KIT Dispense based on patient and insurance preference. Use up to four times daily as directed. (FOR ICD-9 250.00, 250.01). 1 each 0  . budesonide-formoterol (SYMBICORT) 160-4.5 MCG/ACT inhaler Take 2 puffs first thing in am and then another 2 puffs about 12 hours later. (Patient taking differently: Inhale 2 puffs into the lungs 2 (two) times daily. ) 1 Inhaler 12  . Insulin Isophane & Regular Human (NOVOLIN 70/30 FLEXPEN) (70-30) 100 UNIT/ML PEN Inject 15 Units into the skin 2 (two) times daily. 15 mL 11  . Insulin Pen Needle (PEN NEEDLES 3/16") 31G X 5 MM MISC 1 Container by Does not apply route 2 (two) times daily. 100 each 0  . levETIRAcetam (KEPPRA) 500 MG tablet Take 1 tablet (500 mg total) by mouth 2 (two) times daily. 60 tablet 5  . Multiple  Vitamin (MULTIVITAMIN) tablet Take 1 tablet by mouth daily.    . ondansetron (ZOFRAN) 8 MG tablet Take 1 tablet (8 mg total) by mouth 2 (two) times daily as needed. Start on the third day after chemotherapy. 30 tablet 1  . temozolomide (TEMODAR) 180 MG capsule Take 2 capsules (354m) by mouth for 5 days on, 23 days off, repeated every 28 days. May take on empty stomach at bedtime to reduce N/V 10 capsule 0  . temozolomide (TEMODAR) 180 MG capsule Take 1 capsules (1824m by mouth for 5 days on, 23 days off, repeated every 28 days. May take on empty stomach at bedtime to reduce N/V 5 capsule 0  . temozolomide (TEMODAR) 180 MG capsule Take 1 capsules (18062mby mouth for 5 days on, 23 days off, repeated every 28 days. May take on empty stomach at bedtime to reduce N/V 5 capsule 0  . temozolomide (TEMODAR) 250 MG capsule Take 1 capsules (250m38my mouth for 5 days on, 23 days off, repeated every 28 days. May take on empty stomach at bedtime to reduce N/V 5 capsule 0  . temozolomide (TEMODAR) 250 MG capsule Take 1 capsules (250mg64m mouth for 5 days on, 23 days off, repeated every 28 days. May take on empty stomach at bedtime to reduce N/V 5 capsule 0   Current Facility-Administered Medications on File Prior to Visit  Medication Dose Route Frequency Provider Last Rate Last Dose  . heparin lock  flush 100 unit/mL  500 Units Intracatheter Once Ventura Sellers, MD      . sodium chloride flush (NS) 0.9 % injection 10 mL  10 mL Intracatheter Once Wessley Emert, [name], MD        Allergies: No Known Allergies   Past Medical History:  Past Medical History:  Diagnosis Date  . Arthritis   . Brain tumor ([place])   . COPD (chronic obstructive pulmonary disease) ([place])   . Diabetes mellitus without complication [facility])    patient states he was taken off meds at last appointment with PCP and is diet controlled  . Dyspnea   . Gout   . Hypertension 08/02/2017  . Pneumonia    Past Surgical History: none    Social  History:  Social History   Socioeconomic History  . Marital status: Significant Other    Spouse name: [name]  . Number of children: Not on file  . Years of education: 82  . Highest education level: Bachelor's degree (e.g., BA, AB, BS)  Occupational History  . 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 Financial resource strain: Not hard at all  . Food insecurity:    Worry: Never true    Inability: Never true  . Transportation needs:    Medical: No    Non-medical: No  Tobacco Use  . Smoking status: Former Smoker    Packs/day: 2.00    Years: 40.00    Pack years: 80.00    Types: Cigarettes    Last attempt to quit: 08/10/2011    Years since quitting: 6.7  . Smokeless tobacco: Never Used  Substance and Sexual Activity  . Alcohol use: Yes    Alcohol/week: 14.0 standard drinks    Types: 14 Cans of beer per week    Frequency: Never  . Drug use: Never  . Sexual activity: Not Currently  Lifestyle  . Physical activity:    Days per week: 0 days    Minutes per session: 0 min  . Stress: Only a little  Relationships  . Social connections:    Talks on phone: Once a week    Gets together: Once a week    Attends religious service: Never    Active member of club or organization: No    Attends meetings of clubs or organizations: Never    Relationship status: Living with partner  . Intimate partner violence:    Fear of current or ex partner: No    Emotionally abused: No    Physically abused: No    Forced sexual activity: No  Other Topics Concern  . Not on file  Social History Narrative  . Not on file   Family History:  Family History  Problem Relation Age of Onset  . Cancer Brother     Review of Systems: Constitutional: Denies fevers, chills or abnormal weight loss Eyes: Denies blurriness of vision Ears, nose, mouth, throat, and face: Denies mucositis or sore throat Respiratory: denies sob Cardiovascular: Denies palpitation, chest  discomfort or lower extremity swelling Gastrointestinal:  Denies nausea, constipation, diarrhea GU: Denies dysuria or incontinence Skin: Denies abnormal skin rashes Neurological: Per HPI Musculoskeletal: Denies joint pain, back or neck discomfort. No decrease in ROM Behavioral/Psych: Denies anxiety, disturbance in thought content, and mood instability  Physical Exam: Vitals:   05/23/18 1259  BP: (!) 142/74  Pulse: 60  Resp: 18  Temp: 97.9 F (36.6 C)  SpO2: 94%   KPS: 80.  General: Alert, cooperative, pleasant, in no acute distress Head: Biopsy scar noted, dry and intact. EENT: No conjunctival injection or scleral icterus. Oral mucosa moist Lungs: Resp effort normal Cardiac: Regular rate and rhythm Abdomen: Soft, non-distended abdomen Skin: No rashes cyanosis or petechiae. Extremities: No clubbing or edema  Neurologic Exam: Mental Status: Awake, alert, attentive to examiner. Oriented to self and environment. Language fluent with intact comprehension, repetition, reading. Cranial Nerves: Visual acuity is grossly normal. Visual fields are full. Extra-ocular movements intact. No ptosis. Face is symmetric, tongue midline. Motor: Tone and bulk are normal. Power 4+/5 in right arm and leg, subtle hip girdle weakness. Reflexes are symmetric, no pathologic reflexes present. Intact finger to nose bilaterally Sensory: Intact to light touch and temperature Gait: Normal, independent   Labs: I have reviewed the data as listed    Component Value Date/Time   NA 144 04/18/2018 1107   K 4.5 04/18/2018 1107   CL 104 04/18/2018 1107   CO2 30 04/18/2018 1107   GLUCOSE 125 (H) 04/18/2018 1107   BUN 9 04/18/2018 1107   CREATININE 0.78 04/18/2018 1107   CALCIUM 9.2 04/18/2018 1107   PROT 6.6 04/18/2018 1107   ALBUMIN 3.7 04/18/2018 1107   AST 32 04/18/2018 1107   ALT 31 04/18/2018 1107   ALKPHOS 92 04/18/2018 1107   BILITOT 0.3 04/18/2018 1107   GFRNONAA >60 04/18/2018 1107   GFRAA >60  04/18/2018 1107   Lab Results  Component Value Date   WBC 6.4 04/18/2018   NEUTROABS 3.7 04/18/2018   HGB 16.8 04/18/2018   HCT 50.5 04/18/2018   MCV 99.0 04/18/2018   PLT 109 (L) 04/18/2018    Assessment/Plan   1. Primary CNS lymphoma ([place])  2. Malignant neoplasm of overlapping sites of brain [facility])  Mr. Bosler is clinically stable today.  His labs are WNL.    We recommended continuing treatment with fourth cycle of Temozolomide 228m/m2, on for five days and off for twenty three days in twenty eight day cycles. The patient will have a complete blood count performed on days 21 and 28 of each cycle, and a comprehensive metabolic panel performed on day 28 of each cycle. Labs may need to be performed more often. Zofran will prescribed for home use for nausea/vomiting.  Side effects were reviewed including fatigue, constipation, cytopenias.  Chemotherapy should be held for the following:  ANC less than 1,000  Platelets less than 100,000  LFT or creatinine greater than 2x ULN  If clinical concerns/contraindications develop    May decrease Keppra to 2570mBID.  We appreciate the opportunity to participate in the care of ChMICHALL NOFFKE  Should return to clinic in 4 weeks with labs for evaluation.  The total time spent in the encounter was 25 minutes and more than 50% was on counseling and review of test results   ZaVentura SellersMD Medical Director of Neuro-Oncology [facility]1/13/20 12:55 PM

## 2018-05-24 ENCOUNTER — Other Ambulatory Visit: Payer: Self-pay | Admitting: Pharmacist

## 2018-05-24 DIAGNOSIS — I10 Essential (primary) hypertension: Secondary | ICD-10-CM | POA: Diagnosis not present

## 2018-05-24 DIAGNOSIS — E119 Type 2 diabetes mellitus without complications: Secondary | ICD-10-CM | POA: Diagnosis not present

## 2018-05-24 DIAGNOSIS — C8589 Other specified types of non-Hodgkin lymphoma, extranodal and solid organ sites: Secondary | ICD-10-CM

## 2018-05-24 DIAGNOSIS — Z794 Long term (current) use of insulin: Secondary | ICD-10-CM | POA: Diagnosis not present

## 2018-05-24 DIAGNOSIS — Z87891 Personal history of nicotine dependence: Secondary | ICD-10-CM | POA: Diagnosis not present

## 2018-05-24 MED ORDER — TEMOZOLOMIDE 180 MG PO CAPS: ORAL_CAPSULE | ORAL | 0 refills | Status: DC

## 2018-05-24 MED ORDER — TEMOZOLOMIDE 250 MG PO CAPS: ORAL_CAPSULE | ORAL | 0 refills | Status: DC

## 2018-05-24 NOTE — Progress Notes (Signed)
Oral Oncology Pharmacist Encounter  Received notification from the [facility] long outpatient pharmacy that patient's co-pay due at the pharmacy for his Temodar prescriptions were prohibitively expensive.  There are currently no foundation co-pay grants available for the patient's diagnosis.  Prescriptions for Temodar 180 mg capsules, quantity #5, and 250 mg capsules, quantity #5, have been E scribed to [facility] in [place], [place], as this specialty pharmacy has an in-house patient assistance program. We are hopeful that they will be able to provide copayment assistance for patient's Temodar.  I called and left voicemail for patient to return call to oral oncology clinic so that I may discuss above information with him.  Johny Drilling, PharmD, BCPS, BCOP  05/24/2018 1:23 PM Oral Oncology Clinic [phone]

## 2018-05-24 NOTE — Telephone Encounter (Signed)
Oral Oncology Pharmacist Encounter  Prescriptions for Temodar 180mg  caps and 250mg  caps e-scribed again to [facility] with updated ICD-10 code. Patient has been informed about dispensing pharmacy change in an effort to get him some copayment assistance. Patient expressed understanding and appreciation. He also expressed frustration with having to pay so much for his medical treatment.  Johny Drilling, PharmD, BCPS, BCOP  05/24/2018 3:56 PM Oral Oncology Clinic [phone]

## 2018-05-27 ENCOUNTER — Telehealth: Payer: Self-pay | Admitting: Medical Oncology

## 2018-05-27 ENCOUNTER — Other Ambulatory Visit: Payer: Self-pay | Admitting: Pharmacist

## 2018-05-27 DIAGNOSIS — C8589 Other specified types of non-Hodgkin lymphoma, extranodal and solid organ sites: Secondary | ICD-10-CM

## 2018-05-27 MED ORDER — TEMOZOLOMIDE 180 MG PO CAPS: ORAL_CAPSULE | ORAL | 0 refills | Status: DC

## 2018-05-27 MED ORDER — TEMOZOLOMIDE 250 MG PO CAPS: ORAL_CAPSULE | ORAL | 0 refills | Status: DC

## 2018-05-27 NOTE — Telephone Encounter (Signed)
Spoke to pharmacist at [facility] and gave him the dx code for primary CNS lymphoma.

## 2018-05-27 NOTE — Telephone Encounter (Signed)
Pt said [facility] told him today that they needed diagnosis code to process rx. Dx code was written on rx per Pharmacist . I called [facility] and no answer-phone just rang.

## 2018-05-30 ENCOUNTER — Telehealth: Payer: Self-pay | Admitting: Pharmacist

## 2018-05-30 DIAGNOSIS — C719 Malignant neoplasm of brain, unspecified: Secondary | ICD-10-CM | POA: Insufficient documentation

## 2018-05-30 NOTE — Telephone Encounter (Signed)
Oral Oncology Pharmacist Encounter  Discussed diagnosis code issue with MD. Medication chart has been updated with the addition of diagnosis of malignant neoplasm of the brain C71.9. Updated chart notes have been faxed to [facility] specialty pharmacy at [phone].  I will continue to update MD as I receive new information.  Johny Drilling, PharmD, BCPS, BCOP  05/30/2018 2:34 PM Oral Oncology Clinic [phone]

## 2018-05-30 NOTE — Telephone Encounter (Signed)
Oral Oncology Pharmacist Encounter  Received call from Collie Siad at [facility] with additional questions about diagnosis and diagnosis code for patient's Temodar. 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 Collie Siad informed that local pharmacy is able to process underneath this ICD-10 code, however the patient's co-pay is unaffordable. I had resent the prescriptions on Friday, 05/27/2018 with diagnosis code of C71.9, which correlates spines to malignant neoplasm of the brain, to [facility]. Collie Siad is requesting chart note so that they can have on file in case they are audited by Medicare part B. Office notes have been faxed to [phone]. Collie Siad will continue to process prescription and keep [place] updated as she is able.  Johny Drilling, PharmD, BCPS, BCOP  05/30/2018 8:05 AM Oral Oncology Clinic [phone]

## 2018-05-31 NOTE — Telephone Encounter (Signed)
MRI should be during the last week of 28 day cycle, with visit following.  So likely need to push back the scan

## 2018-06-01 NOTE — Telephone Encounter (Signed)
Oral Oncology Pharmacist Encounter  I followed up with Collie Siad at [facility] to check on status of patient's Temodar. They are able to process his claim now with updated chart notes and diagnosis code. Copayment for entire fill $459.78 Once they are able to connect with patient they can set him up for their patient assistance. I called and updated patient with above information. Patient instructed to call sue at [facility] at [phone] and she will get him all set up. Patient stated he would call immediately to be set up for assistance and to schedule Temodar delivery. He will let the office know if he has further issues.  Johny Drilling, PharmD, BCPS, BCOP  06/01/2018 12:24 PM Oral Oncology Clinic [phone]

## 2018-06-02 NOTE — Telephone Encounter (Addendum)
Oral Oncology Pharmacist Encounter  Received notification from [facility] that they have been in contact with Tim [name], they are able to provide patient assistance to him, and his Temodar fill will shipped out today for delivery to patient tomorrow, 06/03/2018.  Johny Drilling, PharmD, BCPS, BCOP  06/02/2018 10:51 AM Oral Oncology Clinic [phone]

## 2018-06-03 ENCOUNTER — Telehealth: Payer: Self-pay | Admitting: *Deleted

## 2018-06-03 NOTE — Telephone Encounter (Signed)
Patient called to advise that he finally got his medication and will start it today 06/03/2018.  Need to adjust upcoming lab/md visit to be 28 days out from start day of Temodar.

## 2018-06-17 ENCOUNTER — Other Ambulatory Visit: Payer: Self-pay

## 2018-06-20 ENCOUNTER — Other Ambulatory Visit: Payer: Self-pay

## 2018-06-20 ENCOUNTER — Ambulatory Visit: Payer: Self-pay | Admitting: Internal Medicine

## 2018-06-27 ENCOUNTER — Ambulatory Visit
Admission: RE | Admit: 2018-06-27 | Discharge: 2018-06-27 | Disposition: A | Discharge status: Home / Self Care | Payer: HMO | Source: Ambulatory Visit | Attending: Internal Medicine | Admitting: Internal Medicine

## 2018-06-27 DIAGNOSIS — C8589 Other specified types of non-Hodgkin lymphoma, extranodal and solid organ sites: Secondary | ICD-10-CM | POA: Diagnosis not present

## 2018-06-27 MED ORDER — GADOBENATE DIMEGLUMINE 529 MG/ML IV SOLN
20.0000 mL | Freq: Once | INTRAVENOUS | Status: AC | PRN
  Administered 2018-06-27: 20 mL via INTRAVENOUS

## 2018-06-30 ENCOUNTER — Inpatient Hospital Stay: Payer: PPO

## 2018-06-30 ENCOUNTER — Inpatient Hospital Stay (HOSPITAL_BASED_OUTPATIENT_CLINIC_OR_DEPARTMENT_OTHER): Payer: PPO | Admitting: Internal Medicine

## 2018-06-30 ENCOUNTER — Other Ambulatory Visit: Payer: Self-pay | Admitting: Radiation Therapy

## 2018-06-30 ENCOUNTER — Inpatient Hospital Stay: Payer: PPO | Attending: Internal Medicine

## 2018-06-30 VITALS — BP 135/79 | HR 57 | Temp 97.8°F | Resp 18 | Ht 75.0 in | Wt 263.2 lb

## 2018-06-30 DIAGNOSIS — Z87891 Personal history of nicotine dependence: Secondary | ICD-10-CM

## 2018-06-30 DIAGNOSIS — Z95828 Presence of other vascular implants and grafts: Secondary | ICD-10-CM | POA: Diagnosis not present

## 2018-06-30 DIAGNOSIS — C718 Malignant neoplasm of overlapping sites of brain: Secondary | ICD-10-CM | POA: Diagnosis not present

## 2018-06-30 DIAGNOSIS — C8589 Other specified types of non-Hodgkin lymphoma, extranodal and solid organ sites: Secondary | ICD-10-CM

## 2018-06-30 LAB — CBC WITH DIFFERENTIAL (CANCER CENTER ONLY)
Abs Immature Granulocytes: 0.05 10*3/uL (ref 0.00–0.07)
Basophils Absolute: 0.1 10*3/uL (ref 0.0–0.1)
Basophils Relative: 1 %
Eosinophils Absolute: 0.2 10*3/uL (ref 0.0–0.5)
Eosinophils Relative: 2 %
HCT: 44.8 % (ref 39.0–52.0)
Hemoglobin: 14.9 g/dL (ref 13.0–17.0)
Immature Granulocytes: 1 %
Lymphocytes Relative: 20 %
Lymphs Abs: 1.6 10*3/uL (ref 0.7–4.0)
MCH: 32.9 pg (ref 26.0–34.0)
MCHC: 33.3 g/dL (ref 30.0–36.0)
MCV: 98.9 fL (ref 80.0–100.0)
Monocytes Absolute: 0.9 10*3/uL (ref 0.1–1.0)
Monocytes Relative: 11 %
Neutro Abs: 5.4 10*3/uL (ref 1.7–7.7)
Neutrophils Relative %: 65 %
Platelet Count: 123 10*3/uL — ABNORMAL LOW (ref 150–400)
RBC: 4.53 MIL/uL (ref 4.22–5.81)
RDW: 14.5 % (ref 11.5–15.5)
WBC Count: 8.3 10*3/uL (ref 4.0–10.5)
nRBC: 0 % (ref 0.0–0.2)

## 2018-06-30 MED ORDER — TEMOZOLOMIDE 180 MG PO CAPS: ORAL_CAPSULE | ORAL | 0 refills | Status: DC

## 2018-06-30 MED ORDER — HEPARIN SOD (PORK) LOCK FLUSH 100 UNIT/ML IV SOLN
500.0000 [IU] | Freq: Once | INTRAVENOUS | Status: AC
  Administered 2018-06-30: 500 [IU]
  Filled 2018-06-30: qty 5

## 2018-06-30 MED ORDER — TEMOZOLOMIDE 250 MG PO CAPS: ORAL_CAPSULE | ORAL | 0 refills | Status: DC

## 2018-06-30 MED ORDER — SODIUM CHLORIDE 0.9% FLUSH
10.0000 mL | Freq: Once | INTRAVENOUS | Status: AC
  Administered 2018-06-30: 10 mL
  Filled 2018-06-30: qty 10

## 2018-06-30 NOTE — Progress Notes (Signed)
[facility] at [address] [place], [place] [address] [phone]   Interval Evaluation  Date of Service: 06/30/18 Patient Name: EMELIO SCHNELLER Patient MRN: [id number] Patient DOB: 1956-03-07 Provider: Ventura Sellers, MD  Identifying Statement:  DUSHAWN PUSEY is a 63 y.o. male with left frontal CNS lymphoma  Interval History:  VICTORIA EUCEDA presents today for follow up after completing cycle #4 of 5-day temodar.  Fatigue was stable compared to last month.  No new or progressive neurologic deficits.  Port still in place.    Medications: Current Outpatient Medications on File Prior to Visit  Medication Sig Dispense Refill  . albuterol (PROVENTIL HFA;VENTOLIN HFA) 108 (90 Base) MCG/ACT inhaler Inhale 2 puffs into the lungs every 4 (four) hours as needed for wheezing or shortness of breath. (Patient not taking: Reported on 05/23/2018) 1 Inhaler 0  . allopurinol (ZYLOPRIM) 300 MG tablet Take 300 mg by mouth daily.    Marland Kitchen atenolol (TENORMIN) 100 MG tablet Take 50 mg by mouth daily.    Marland Kitchen b complex vitamins tablet Take 1 tablet by mouth daily.    . blood glucose meter kit and supplies KIT Dispense based on patient and insurance preference. Use up to four times daily as directed. (FOR ICD-9 250.00, 250.01). 1 each 0  . budesonide-formoterol (SYMBICORT) 160-4.5 MCG/ACT inhaler Take 2 puffs first thing in am and then another 2 puffs about 12 hours later. (Patient taking differently: Inhale 2 puffs into the lungs 2 (two) times daily. ) 1 Inhaler 12  . Insulin Isophane & Regular Human (NOVOLIN 70/30 FLEXPEN) (70-30) 100 UNIT/ML PEN Inject 15 Units into the skin 2 (two) times daily. 15 mL 11  . Insulin Pen Needle (PEN NEEDLES 3/16") 31G X 5 MM MISC 1 Container by Does not apply route 2 (two) times daily. 100 each 0  . levETIRAcetam (KEPPRA) 500 MG tablet Take 1 tablet (500 mg total) by mouth 2 (two) times daily. 60 tablet 5  . Multiple Vitamin  (MULTIVITAMIN) tablet Take 1 tablet by mouth daily.    . ondansetron (ZOFRAN) 8 MG tablet Take 1 tablet (8 mg total) by mouth 2 (two) times daily as needed. Start on the third day after chemotherapy. (Patient not taking: Reported on 05/23/2018) 30 tablet 1  . temozolomide (TEMODAR) 180 MG capsule Take 1 capsules (153m) by mouth for 5 days on, 23 days off, repeated every 28 days. May take on empty stomach at bedtime to reduce N/V 5 capsule 0  . temozolomide (TEMODAR) 250 MG capsule Take 1 capsules (2568m by mouth for 5 days on, 23 days off, repeated every 28 days. May take on empty stomach at bedtime to reduce N/V 5 capsule 0   Current Facility-Administered Medications on File Prior to Visit  Medication Dose Route Frequency Provider Last Rate Last Dose  . heparin lock flush 100 unit/mL  500 Units Intracatheter Once VaVentura SellersMD      . sodium chloride flush (NS) 0.9 % injection 10 mL  10 mL Intracatheter Once VaVentura SellersMD        Allergies: No Known Allergies   Past Medical History:  Past Medical History:  Diagnosis Date  . Arthritis   . [place] ([place]  . COPD (chronic obstructive pulmonary disease) ([facility]  . [facility]   patient states he was taken off meds at last appointment with PCP and is diet controlled  .  Dyspnea   . Gout   . Hypertension 08/02/2017  . Pneumonia    Past Surgical History: none    Social History:  Social History   Socioeconomic History  . Marital status: Significant Other    Spouse name: [name]  . Number of children: Not on file  . Years of education: 34  . Highest education level: Bachelor's degree (e.g., BA, AB, BS)  Occupational History  . 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 Financial resource strain: Not hard at all  . Food insecurity:    Worry: Never true    Inability: Never true  . Transportation needs:    Medical: No    Non-medical: No  Tobacco  Use  . Smoking status: Former Smoker    Packs/day: 2.00    Years: 40.00    Pack years: 80.00    Types: Cigarettes    Last attempt to quit: 08/10/2011    Years since quitting: 6.8  . Smokeless tobacco: Never Used  Substance and Sexual Activity  . Alcohol use: Yes    Alcohol/week: 14.0 standard drinks    Types: 14 Cans of beer per week    Frequency: Never  . Drug use: Never  . Sexual activity: Not Currently  Lifestyle  . Physical activity:    Days per week: 0 days    Minutes per session: 0 min  . Stress: Only a little  Relationships  . Social connections:    Talks on phone: Once a week    Gets together: Once a week    Attends religious service: Never    Active member of club or organization: No    Attends meetings of clubs or organizations: Never    Relationship status: Living with partner  . Intimate partner violence:    Fear of current or ex partner: No    Emotionally abused: No    Physically abused: No    Forced sexual activity: No  Other Topics Concern  . Not on file  Social History Narrative  . Not on file   Family History:  Family History  Problem Relation Age of Onset  . Cancer Brother     Review of Systems: Constitutional: Denies fevers, chills or abnormal weight loss Eyes: Denies blurriness of vision Ears, nose, mouth, throat, and face: Denies mucositis or sore throat Respiratory: denies sob Cardiovascular: Denies palpitation, chest discomfort or lower extremity swelling Gastrointestinal:  Denies nausea, constipation, diarrhea GU: Denies dysuria or incontinence Skin: Denies abnormal skin rashes Neurological: Per HPI Musculoskeletal: Denies joint pain, back or neck discomfort. No decrease in ROM Behavioral/Psych: Denies anxiety, disturbance in thought content, and mood instability  Physical Exam: Vitals:   06/30/18 1227  BP: 135/79  Pulse: (!) 57  Resp: 18  Temp: 97.8 F (36.6 C)  SpO2: 94%   KPS: 80. General: Alert, cooperative, pleasant, in  no acute distress Head: Biopsy scar noted, dry and intact. EENT: No conjunctival injection or scleral icterus. Oral mucosa moist Lungs: Resp effort normal Cardiac: Regular rate and rhythm Abdomen: Soft, non-distended abdomen Skin: No rashes cyanosis or petechiae. Extremities: No clubbing or edema  Neurologic Exam: Mental Status: Awake, alert, attentive to examiner. Oriented to self and environment. Language fluent with intact comprehension, repetition, reading. Cranial Nerves: Visual acuity is grossly normal. Visual fields are full. Extra-ocular movements intact. No ptosis. Face is symmetric, tongue midline. Motor: Tone and bulk are normal. Power 4+/5 in right arm and leg, subtle  hip girdle weakness. Reflexes are symmetric, no pathologic reflexes present. Intact finger to nose bilaterally Sensory: Intact to light touch and temperature Gait: Normal, independent   Labs: I have reviewed the data as listed    Component Value Date/Time   NA 140 05/23/2018 1225   K 4.4 05/23/2018 1225   CL 105 05/23/2018 1225   CO2 29 05/23/2018 1225   GLUCOSE 128 (H) 05/23/2018 1225   BUN 14 05/23/2018 1225   CREATININE 0.77 05/23/2018 1225   CALCIUM 9.1 05/23/2018 1225   PROT 6.8 05/23/2018 1225   ALBUMIN 3.9 05/23/2018 1225   AST 40 05/23/2018 1225   ALT 37 05/23/2018 1225   ALKPHOS 103 05/23/2018 1225   BILITOT 0.6 05/23/2018 1225   GFRNONAA >60 05/23/2018 1225   GFRAA >60 05/23/2018 1225   Lab Results  Component Value Date   WBC 8.3 06/30/2018   NEUTROABS 5.4 06/30/2018   HGB 14.9 06/30/2018   HCT 44.8 06/30/2018   MCV 98.9 06/30/2018   PLT 123 (L) 06/30/2018    Imaging:  [place] Clinician Interpretation: I have personally reviewed the CNS images as listed.  My interpretation, in the context of the patient's clinical presentation, is stable disease  Mr Jeri Cos Wo Contrast  Result Date: 06/27/2018 CLINICAL DATA:  Primary CNS lymphoma. EXAM: MRI HEAD WITHOUT AND WITH CONTRAST  TECHNIQUE: Multiplanar, multiecho pulse sequences of the brain and surrounding structures were obtained without and with intravenous contrast. CONTRAST:  80m MULTIHANCE GADOBENATE DIMEGLUMINE 529 MG/ML IV SOLN COMPARISON:  MRI brain 04/15/2018. FINDINGS: Brain: Left frontal biopsy sites are again noted. FLAIR signal changes are stable. There is no restricted diffusion. No enhancement is present. There is no evidence for residual recurrent CNS lymphoma. Other atrophy and periventricular white matter changes are stable. Ventricles proportionate to the degree of atrophy. T2 signal changes along the left cortical spinal tracts are stable. The internal auditory canals are within normal limits. The brainstem and cerebellum are within normal limits. Postcontrast images demonstrate no pathologic enhancement. No significant extraaxial fluid collection is present. Vascular: Flow is present in the major intracranial arteries. Skull and upper cervical spine: The craniocervical junction is normal. Upper cervical spine is within normal limits. Marrow signal is unremarkable. Sinuses/Orbits: Mild mucosal thickening is present in the inferior left maxillary sinus. There is some mucosal thickening in the inferior right maxillary sinus. Minimal fluid is present in the mastoid air cells bilaterally. The globes and orbits are within normal limits. IMPRESSION: 1. Stable post biopsy and post treatment changes. 2. No evidence for residual or recurrent disease to the brain. 3. Mild maxillary sinus disease, left greater than right. Electronically Signed   By: CSan MorelleM.D.   On: 06/27/2018 14:06     Assessment/Plan 1. Primary CNS lymphoma ([place]  2. Malignant neoplasm of overlapping sites of brain ([facility]  Mr. SDunivanis clinically and radiographically stable today.  His labs are WNL.    We recommended continuing treatment with cycle #5 of Temozolomide 2038mm2, on for five days and off for twenty three days in twenty  eight day cycles. The patient will have a complete blood count performed on days 21 and 28 of each cycle, and a comprehensive metabolic panel performed on day 28 of each cycle. Labs may need to be performed more often. Zofran will prescribed for home use for nausea/vomiting.  Side effects were reviewed including fatigue, constipation, cytopenias.  Chemotherapy should be held for the following:  ANC less than 1,000  Platelets less  than 100,000  LFT or creatinine greater than 2x ULN  If clinical concerns/contraindications develop    May continue Keppra 234m BID.  We appreciate the opportunity to participate in the care of CKHALIN ROYCE   Dosing for cycle #5 will be delayed because of planned surgery for his spouse. Should return to clinic in ~5 weeks with labs for evaluation or 28 days after starting next cycle.  The total time spent in the encounter was 25 minutes and more than 50% was on counseling and review of test results   ZVentura Sellers MD Medical Director of Neuro-Oncology C[facility] Centerat WNeponset02/20/20 12:08 PM

## 2018-07-01 ENCOUNTER — Telehealth: Payer: Self-pay | Admitting: Internal Medicine

## 2018-07-01 NOTE — Telephone Encounter (Signed)
Scheduled appt per 2/21 sch message - left message and sent reminder letter in the mail.

## 2018-07-01 NOTE — Telephone Encounter (Signed)
No 2/20 los

## 2018-07-26 ENCOUNTER — Telehealth: Payer: Self-pay | Admitting: *Deleted

## 2018-07-26 ENCOUNTER — Telehealth: Payer: Self-pay | Admitting: Internal Medicine

## 2018-07-26 NOTE — Telephone Encounter (Signed)
R/s appt per 3/17 sch message - pt aware of appt date and time   

## 2018-07-26 NOTE — Telephone Encounter (Signed)
[name] states he has been holding his Temodar for 2 weeks due to wife's surgery.Wants to know if Dr Mickeal Skinner wants him to continue to hold for 2 more weeks as she is still recovering- last scan was good.  OK to hold per Dr Mickeal Skinner.   Specialty pharmacy notified that Temodar is on hold

## 2018-08-05 ENCOUNTER — Other Ambulatory Visit: Payer: HMO

## 2018-08-05 ENCOUNTER — Ambulatory Visit: Payer: HMO | Admitting: Internal Medicine

## 2018-08-15 ENCOUNTER — Other Ambulatory Visit: Payer: Self-pay | Admitting: Internal Medicine

## 2018-08-15 ENCOUNTER — Telehealth: Payer: Self-pay | Admitting: *Deleted

## 2018-08-15 DIAGNOSIS — C8589 Other specified types of non-Hodgkin lymphoma, extranodal and solid organ sites: Secondary | ICD-10-CM

## 2018-08-15 NOTE — Telephone Encounter (Signed)
Patient called to ask if he could safely push out the start of his Temodar date due to the current pandemic. He has high risk factors age, copd, and DM.  He doesn't want to further compromise his health with coming into visits and taking the medication.    He said he has the Temodar on hand but hasn't taken yet.  He also is still taking care of his wife who was just recently diagnosed with kidney cancer.  Can he wait until after COVID is over?    Please advise.

## 2018-08-15 NOTE — Telephone Encounter (Signed)
Per Dr. Mickeal Skinner ok to postpone follow up and current dose of Temodar per patient request due to his increased risk of COPD/DM/Chemo Agent/Age.    Will plan for MRI in May and follow up after.  Hold Temodar at this point.

## 2018-08-19 ENCOUNTER — Other Ambulatory Visit: Payer: HMO

## 2018-08-19 ENCOUNTER — Ambulatory Visit: Payer: HMO | Admitting: Internal Medicine

## 2018-09-13 ENCOUNTER — Other Ambulatory Visit: Payer: Self-pay | Admitting: Internal Medicine

## 2018-09-13 ENCOUNTER — Other Ambulatory Visit: Payer: Self-pay | Admitting: Radiation Therapy

## 2018-09-16 ENCOUNTER — Other Ambulatory Visit: Payer: Self-pay

## 2018-09-16 ENCOUNTER — Ambulatory Visit
Admission: RE | Admit: 2018-09-16 | Discharge: 2018-09-16 | Disposition: A | Discharge status: Home / Self Care | Payer: HMO | Source: Ambulatory Visit | Attending: Internal Medicine | Admitting: Internal Medicine

## 2018-09-16 DIAGNOSIS — C8589 Other specified types of non-Hodgkin lymphoma, extranodal and solid organ sites: Secondary | ICD-10-CM

## 2018-09-16 MED ORDER — GADOBENATE DIMEGLUMINE 529 MG/ML IV SOLN
20.0000 mL | Freq: Once | INTRAVENOUS | Status: AC | PRN
  Administered 2018-09-16: 20 mL via INTRAVENOUS

## 2018-09-20 ENCOUNTER — Inpatient Hospital Stay: Payer: PPO | Attending: Internal Medicine

## 2018-09-20 ENCOUNTER — Ambulatory Visit: Payer: PPO | Admitting: Internal Medicine

## 2018-09-20 ENCOUNTER — Other Ambulatory Visit: Payer: Self-pay

## 2018-09-20 ENCOUNTER — Encounter: Payer: Self-pay | Admitting: Internal Medicine

## 2018-09-20 ENCOUNTER — Inpatient Hospital Stay (HOSPITAL_BASED_OUTPATIENT_CLINIC_OR_DEPARTMENT_OTHER): Payer: PPO | Admitting: Internal Medicine

## 2018-09-20 ENCOUNTER — Telehealth: Payer: Self-pay | Admitting: Internal Medicine

## 2018-09-20 ENCOUNTER — Other Ambulatory Visit: Payer: PPO

## 2018-09-20 ENCOUNTER — Inpatient Hospital Stay: Payer: PPO

## 2018-09-20 VITALS — BP 152/85 | HR 62 | Temp 97.5°F | Resp 17 | Ht 75.0 in | Wt 269.3 lb

## 2018-09-20 DIAGNOSIS — Z87891 Personal history of nicotine dependence: Secondary | ICD-10-CM | POA: Insufficient documentation

## 2018-09-20 DIAGNOSIS — C8589 Other specified types of non-Hodgkin lymphoma, extranodal and solid organ sites: Secondary | ICD-10-CM | POA: Diagnosis not present

## 2018-09-20 DIAGNOSIS — Z95828 Presence of other vascular implants and grafts: Secondary | ICD-10-CM

## 2018-09-20 LAB — CBC WITH DIFFERENTIAL (CANCER CENTER ONLY)
Abs Immature Granulocytes: 0.01 10*3/uL (ref 0.00–0.07)
Basophils Absolute: 0 10*3/uL (ref 0.0–0.1)
Basophils Relative: 1 %
Eosinophils Absolute: 0.2 10*3/uL (ref 0.0–0.5)
Eosinophils Relative: 3 %
HCT: 48.9 % (ref 39.0–52.0)
Hemoglobin: 16.2 g/dL (ref 13.0–17.0)
Immature Granulocytes: 0 %
Lymphocytes Relative: 22 %
Lymphs Abs: 1.4 10*3/uL (ref 0.7–4.0)
MCH: 32.7 pg (ref 26.0–34.0)
MCHC: 33.1 g/dL (ref 30.0–36.0)
MCV: 98.8 fL (ref 80.0–100.0)
Monocytes Absolute: 0.8 10*3/uL (ref 0.1–1.0)
Monocytes Relative: 12 %
Neutro Abs: 3.9 10*3/uL (ref 1.7–7.7)
Neutrophils Relative %: 62 %
Platelet Count: 151 10*3/uL (ref 150–400)
RBC: 4.95 MIL/uL (ref 4.22–5.81)
RDW: 12 % (ref 11.5–15.5)
WBC Count: 6.3 10*3/uL (ref 4.0–10.5)
nRBC: 0 % (ref 0.0–0.2)

## 2018-09-20 LAB — CMP (CANCER CENTER ONLY)
ALT: 85 U/L — ABNORMAL HIGH (ref 0–44)
AST: 74 U/L — ABNORMAL HIGH (ref 15–41)
Albumin: 3.8 g/dL (ref 3.5–5.0)
Alkaline Phosphatase: 120 U/L (ref 38–126)
Anion gap: 13 (ref 5–15)
BUN: 13 mg/dL (ref 8–23)
CO2: 27 mmol/L (ref 22–32)
Calcium: 9.1 mg/dL (ref 8.9–10.3)
Chloride: 102 mmol/L (ref 98–111)
Creatinine: 0.89 mg/dL (ref 0.61–1.24)
GFR, Est AFR Am: >60 mL/min (ref >60)
GFR, Estimated: >60 mL/min (ref >60)
Glucose, Bld: 130 mg/dL — ABNORMAL HIGH (ref 70–99)
Potassium: 4.5 mmol/L (ref 3.5–5.1)
Sodium: 142 mmol/L (ref 135–145)
Total Bilirubin: 0.5 mg/dL (ref 0.3–1.2)
Total Protein: 7.2 g/dL (ref 6.5–8.1)

## 2018-09-20 MED ORDER — SODIUM CHLORIDE 0.9% FLUSH
10.0000 mL | Freq: Once | INTRAVENOUS | Status: AC
  Administered 2018-09-20: 10 mL
  Filled 2018-09-20: qty 10

## 2018-09-20 MED ORDER — HEPARIN SOD (PORK) LOCK FLUSH 100 UNIT/ML IV SOLN
500.0000 [IU] | Freq: Once | INTRAVENOUS | Status: AC
  Administered 2018-09-20: 500 [IU]
  Filled 2018-09-20: qty 5

## 2018-09-20 NOTE — Telephone Encounter (Signed)
Scheduled appt per 5/12 los. ° °A calendar will be mailed out. °

## 2018-09-20 NOTE — Progress Notes (Signed)
[facility] at [address] [place], [place] [address] [phone]   Interval Evaluation  Date of Service: 09/20/18 Patient Name: Tim [name] Patient MRN: [id number] Patient DOB: 03-13-1956 Provider: Ventura Sellers, MD  Identifying Statement:  Tim [name] is a 63 y.o. 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 No new or progressive neurologic deficits.  Port still in place.    Medications: Current Outpatient Medications on File Prior to Visit  Medication Sig Dispense Refill   allopurinol (ZYLOPRIM) 300 MG tablet Take 300 mg by mouth daily.     atenolol (TENORMIN) 100 MG tablet Take 50 mg by mouth daily.     b complex vitamins tablet Take 1 tablet by mouth daily.     blood glucose meter kit and supplies KIT Dispense based on patient and insurance preference. Use up to four times daily as directed. (FOR ICD-9 250.00, 250.01). 1 each 0   budesonide-formoterol (SYMBICORT) 160-4.5 MCG/ACT inhaler Take 2 puffs first thing in am and then another 2 puffs about 12 hours later. (Patient taking differently: Inhale 2 puffs into the lungs 2 (two) times daily. ) 1 Inhaler 12   Insulin Isophane & Regular Human (NOVOLIN 70/30 FLEXPEN) (70-30) 100 UNIT/ML PEN Inject 15 Units into the skin 2 (two) times daily. 15 mL 11   Insulin Pen Needle (PEN NEEDLES 3/16") 31G X 5 MM MISC 1 Container by Does not apply route 2 (two) times daily. 100 each 0   levETIRAcetam (KEPPRA) 500 MG tablet Take 1 tablet by mouth twice daily 60 tablet 0   Multiple Vitamin (MULTIVITAMIN) tablet Take 1 tablet by mouth daily.     ondansetron (ZOFRAN) 8 MG tablet Take 1 tablet (8 mg total) by mouth 2 (two) times daily as needed. Start on the third day after chemotherapy. 30 tablet 1   temozolomide (TEMODAR) 180 MG capsule Take 1 capsules (136m) by mouth for 5 days on, 23 days off,  repeated every 28 days. May take on empty stomach at bedtime to reduce N/V 5 capsule 0   temozolomide (TEMODAR) 250 MG capsule Take 1 capsules (2510m by mouth for 5 days on, 23 days off, repeated every 28 days. May take on empty stomach at bedtime to reduce N/V 5 capsule 0   albuterol (PROVENTIL HFA;VENTOLIN HFA) 108 (90 Base) MCG/ACT inhaler Inhale 2 puffs into the lungs every 4 (four) hours as needed for wheezing or shortness of breath. (Patient not taking: Reported on 05/23/2018) 1 Inhaler 0   Current Facility-Administered Medications on File Prior to Visit  Medication Dose Route Frequency Provider Last Rate Last Dose   heparin lock flush 100 unit/mL  500 Units Intracatheter Once VaVentura SellersMD       sodium chloride flush (NS) 0.9 % injection 10 mL  10 mL Intracatheter Once VaVentura SellersMD        Allergies: No Known Allergies   Past Medical History:  Past Medical History:  Diagnosis Date   Arthritis    Brain tumor ([facility]   COPD (chronic obstructive pulmonary disease) ([facility]   Diabetes mellitus without complication ([facility]   patient states he was taken off meds at last appointment with PCP and is diet controlled   Dyspnea    Gout    Hypertension 08/02/2017   Pneumonia    Past Surgical History: none  Social History:  Social History   Socioeconomic History   Marital status: Significant Other    Spouse name: [name]   Number of children: Not on file   Years of education: 16   Highest education level: Bachelor's degree (e.g., BA, AB, BS)  Occupational History   Occupation: retired    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  Scientist, product/process development strain: Not hard at all   Food insecurity:    Worry: Never true    Inability: Never true   Transportation needs:    Medical: No    Non-medical: No  Tobacco Use   Smoking status: Former Smoker    Packs/day: 2.00    Years: 40.00    Pack years: 80.00    Types: Cigarettes    Last attempt  to quit: 08/10/2011    Years since quitting: 7.1   Smokeless tobacco: Never Used  Substance and Sexual Activity   Alcohol use: Yes    Alcohol/week: 14.0 standard drinks    Types: 14 Cans of beer per week    Frequency: Never   Drug use: Never   Sexual activity: Not Currently  Lifestyle   Physical activity:    Days per week: 0 days    Minutes per session: 0 min   Stress: Only a little  Relationships   Social connections:    Talks on phone: Once a week    Gets together: Once a week    Attends religious service: Never    Active member of club or organization: No    Attends meetings of clubs or organizations: Never    Relationship status: Living with partner   Intimate partner violence:    Fear of current or ex partner: No    Emotionally abused: No    Physically abused: No    Forced sexual activity: No  Other Topics Concern   Not on file  Social History Narrative   Not on file   Family History:  Family History  Problem Relation Age of Onset   Cancer Brother     Review of Systems: Constitutional: Denies fevers, chills or abnormal weight loss Eyes: Denies blurriness of vision Ears, nose, mouth, throat, and face: Denies mucositis or sore throat Respiratory: denies sob Cardiovascular: Denies palpitation, chest discomfort or lower extremity swelling Gastrointestinal:  Denies nausea, constipation, diarrhea GU: Denies dysuria or incontinence Skin: Denies abnormal skin rashes Neurological: Per HPI Musculoskeletal: Denies joint pain, back or neck discomfort. No decrease in ROM Behavioral/Psych: Denies anxiety, disturbance in thought content, and mood instability  Physical Exam: Vitals:   09/20/18 1157  BP: (!) 152/85  Pulse: 62  Resp: 17  Temp: (!) 97.5 F (36.4 C)  SpO2: 93%   KPS: 80. General: Alert, cooperative, pleasant, in no acute distress Head: Biopsy scar noted, dry and intact. EENT: No conjunctival injection or scleral icterus. Oral mucosa  moist Lungs: Resp effort normal Cardiac: Regular rate and rhythm Abdomen: Soft, non-distended abdomen Skin: No rashes cyanosis or petechiae. Extremities: No clubbing or edema  Neurologic Exam: Mental Status: Awake, alert, attentive to examiner. Oriented to self and environment. Language fluent with intact comprehension, repetition, reading. Cranial Nerves: Visual acuity is grossly normal. Visual fields are full. Extra-ocular movements intact. No ptosis. Face is symmetric, tongue midline. Motor: Tone and bulk are normal. Power 4+/5 in right arm and leg, subtle hip girdle weakness. Reflexes are symmetric, no pathologic reflexes present. Intact finger to nose bilaterally Sensory: Intact to light touch and temperature  Gait: Normal, independent   Labs: I have reviewed the data as listed    Component Value Date/Time   NA 140 05/23/2018 1225   K 4.4 05/23/2018 1225   CL 105 05/23/2018 1225   CO2 29 05/23/2018 1225   GLUCOSE 128 (H) 05/23/2018 1225   BUN 14 05/23/2018 1225   CREATININE 0.77 05/23/2018 1225   CALCIUM 9.1 05/23/2018 1225   PROT 6.8 05/23/2018 1225   ALBUMIN 3.9 05/23/2018 1225   AST 40 05/23/2018 1225   ALT 37 05/23/2018 1225   ALKPHOS 103 05/23/2018 1225   BILITOT 0.6 05/23/2018 1225   GFRNONAA >60 05/23/2018 1225   GFRAA >60 05/23/2018 1225   Lab Results  Component Value Date   WBC 6.3 09/20/2018   NEUTROABS 3.9 09/20/2018   HGB 16.2 09/20/2018   HCT 48.9 09/20/2018   MCV 98.8 09/20/2018   PLT 151 09/20/2018    Imaging:  [place] Clinician Interpretation: I have personally reviewed the CNS images as listed.  My interpretation, in the context of the patient's clinical presentation, is stable disease  Mr Tim [name] Wo Contrast  Result Date: 09/16/2018 CLINICAL DATA:  Followup CNS lymphoma. Previous biopsies 1 year ago. Asymptomatic presently. Being treated with Temodar. Creatinine was obtained on site at [facility] at [address] Results: Creatinine  0.8 mg/dL. EXAM: MRI HEAD WITHOUT AND WITH CONTRAST TECHNIQUE: Multiplanar, multiecho pulse sequences of the brain and surrounding structures were obtained without and with intravenous contrast. CONTRAST:  43m MULTIHANCE GADOBENATE DIMEGLUMINE 529 MG/ML IV SOLN COMPARISON:  06/27/2018.  04/15/2018. FINDINGS: Brain: Stable examination. The brainstem and cerebellum are unremarkable. Right cerebral hemispheres normal for age. On the left, there are 2 small post biopsy defects in the left pattern is stable. There is no mass effect or contrast enhancement. No new brain lesion. No hydrocephalus or extra-axial collection. Frontal white matter with regional gliosis. Vascular: Major vessels at the base of the brain show flow. Skull and upper cervical spine: Negative Sinuses/Orbits: Mild mucosal thickening of the maxillary sinus floors. No advanced sinusitis. Orbits negative. Other: None IMPRESSION: Stable examination. No evidence of residual or recurrent disease. Post biopsy defects and gliosis in 2 locations of the left frontal lobe. Electronically Signed   By: MNelson ChimesM.D.   On: 09/16/2018 13:50     Assessment/Plan 1. Primary CNS lymphoma ([facility]  Mr. SGriffingis clinically and radiographically stable today. He completed 5 cycles of consolidation Temodar.  We recommended he remain on observation, returning in 3 months with MRI brain for review.    May continue Keppra 2560mBID.  We appreciate the opportunity to participate in the care of Tim [name]  The total time spent in the encounter was 25 minutes and more than 50% was on counseling and review of test results   ZaVentura SellersMD Medical Director of Neuro-Oncology CoKadlec Medical Centert WeTower Lakes5/12/20 12:17 PM

## 2018-09-22 ENCOUNTER — Other Ambulatory Visit: Payer: Self-pay | Admitting: *Deleted

## 2018-09-22 DIAGNOSIS — C8589 Other specified types of non-Hodgkin lymphoma, extranodal and solid organ sites: Secondary | ICD-10-CM

## 2018-10-27 IMAGING — MR MR HEAD WO/W CM
13 series · 48 of 48 positions shown · IV contrast (multihance)
Comparison: MRI head 11/09/2017, 08/16/2017

CLINICAL DATA: Primary CNS lymphoma. Postop methotrexate treatment.
Initial left frontal biopsy nondiagnostic. Repeat biopsy 11/10/2017
left frontal lobe diagnostic for lymphoma.

EXAM:
MRI HEAD WITHOUT AND WITH CONTRAST
TECHNIQUE: Multiplanar, multiecho pulse sequences of the brain and surrounding
structures were obtained without and with intravenous contrast.
CONTRAST:  20mL MULTIHANCE GADOBENATE DIMEGLUMINE 529 MG/ML IV SOLN

[Series 5: T1 · sagittal · 4.0mm · 0.75mm/px · 1 of 31 slices shown (1 of 3)]
[im 1/31]
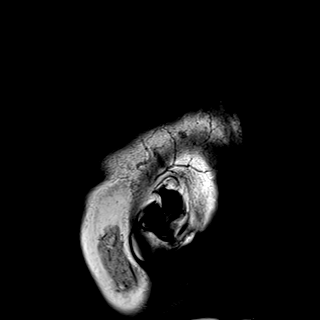

[Series 6: DWI · axial · 3.0mm · 1.44mm/px · z∈[-48,+88]mm · 4 of 88 slices shown (1 of 4)]
[im 1/88]
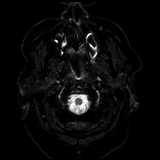
[im 30/88]
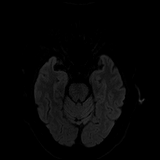
[im 59/88]
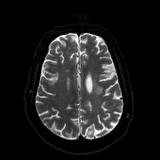
[im 88/88]
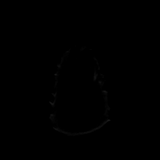

[Series 7: DWI · axial · 3.0mm · 1.44mm/px · z∈[-48,+88]mm · 3 of 44 slices shown (2 of 4)]
[im 1/44]
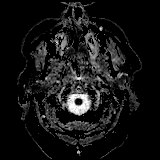
[im 22/44]
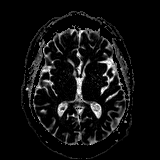
[im 44/44]
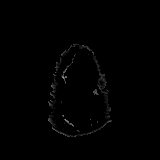

[Series 8: DWI · coronal · 5.0mm · 1.44mm/px · 4 of 60 slices shown (3 of 4)]
[im 1/60]
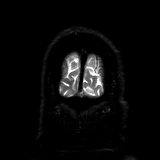
[im 20/60]
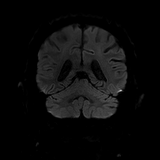
[im 40/60]
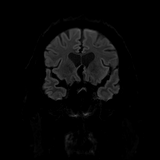
[im 60/60]
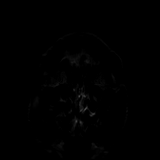

[Series 9: DWI · coronal · 5.0mm · 1.44mm/px · 2 of 30 slices shown (4 of 4)]
[im 1/30]
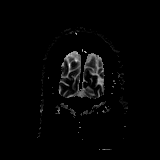
[im 30/30]
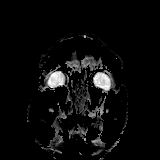

[Series 10: T2 · axial · 4.0mm · 0.36mm/px · z∈[-50,+86]mm · 2 of 28 slices shown]
[im 1/28]
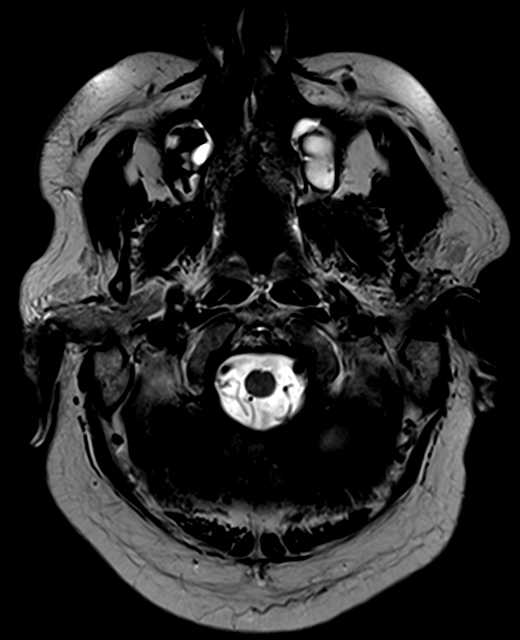
[im 28/28]
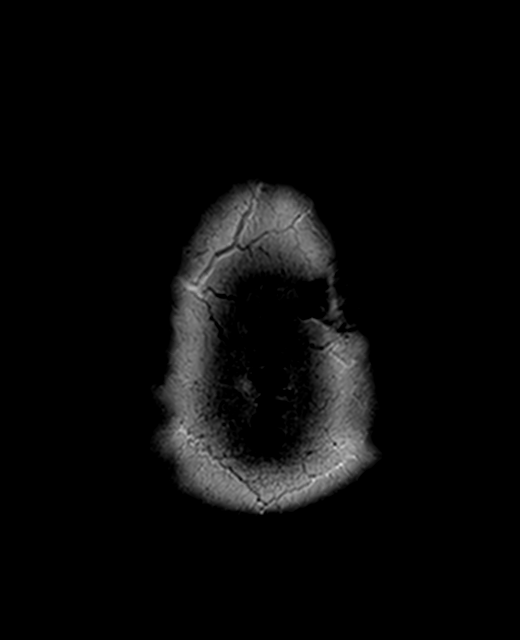

[Series 11: FLAIR · axial · 3.0mm · 0.72mm/px · z∈[-54,+91]mm · 2 of 26 slices shown]
[im 1/26]
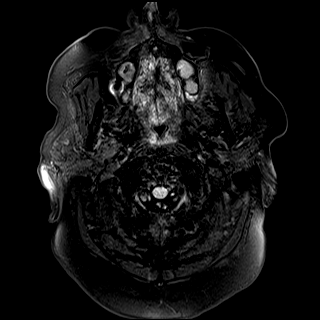
[im 26/26]
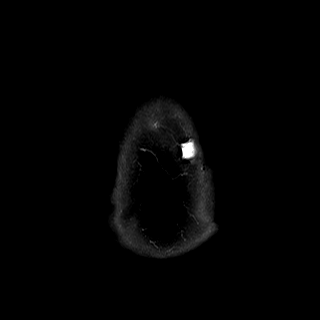

[Series 13: swi_images · axial · 1.5mm · 0.90mm/px · z∈[-51,+87]mm · 6 of 96 slices shown]
[im 1/96]
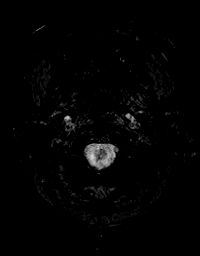
[im 20/96]
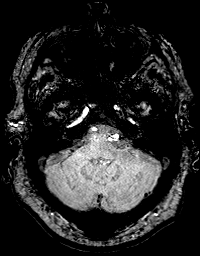
[im 39/96]
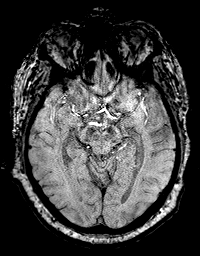
[im 58/96]
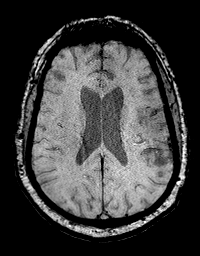
[im 77/96]
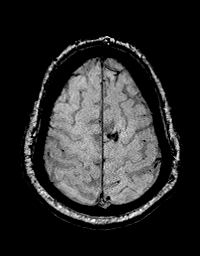
[im 96/96]
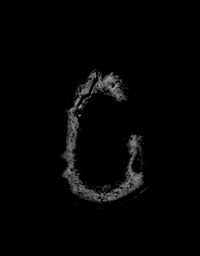

[Series 14: T1 · axial · 1.0mm · 0.90mm/px · z∈[-53,+84]mm · 9 of 144 slices shown (2 of 3)]
[im 1/144]
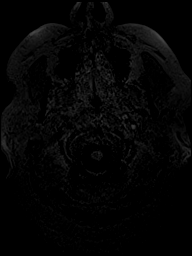
[im 18/144]
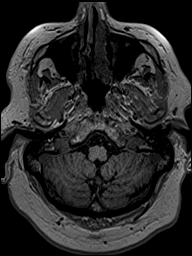
[im 36/144]
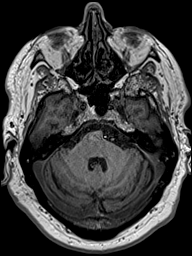
[im 54/144]
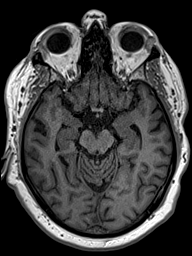
[im 72/144]
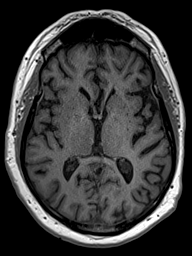
[im 90/144]
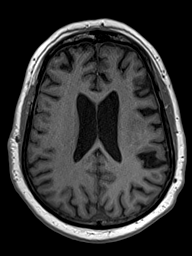
[im 108/144]
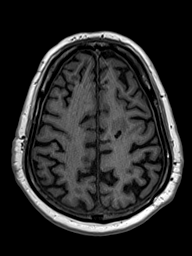
[im 126/144]
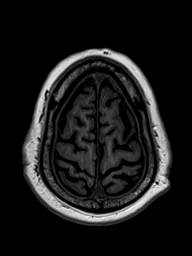
[im 144/144]
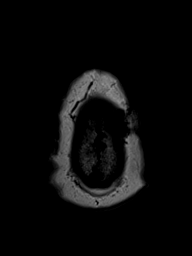

[Series 15: T2 post-contrast · coronal · 4.5mm · 0.36mm/px · 2 of 35 slices shown]
[im 1/35]
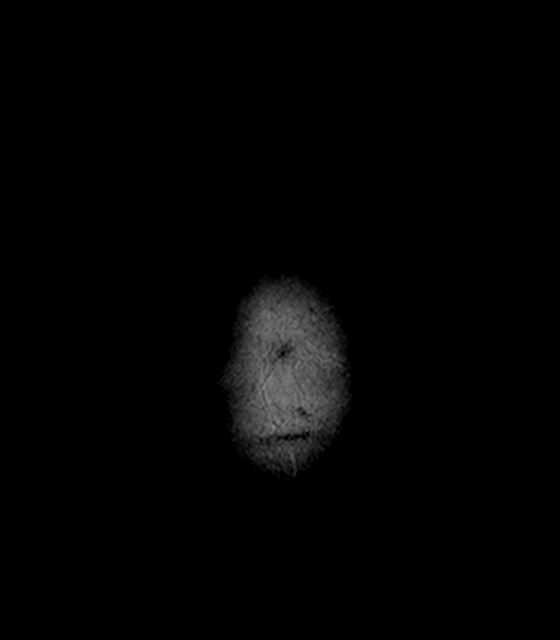
[im 35/35]
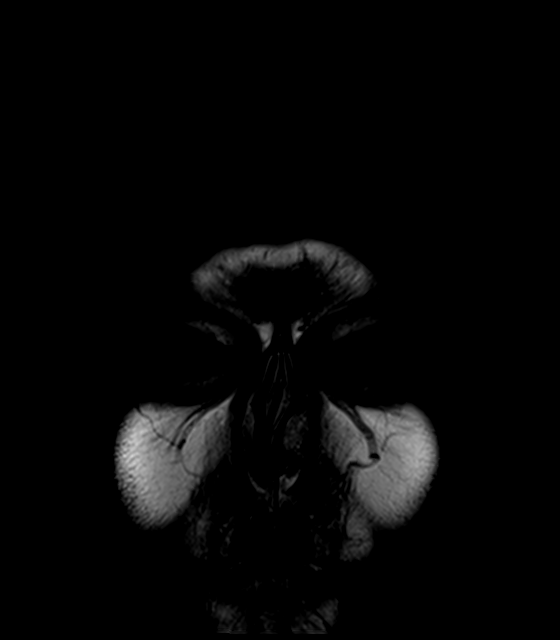

[Series 16: T1 · axial · 1.0mm · 0.90mm/px · z∈[-53,+84]mm · 9 of 144 slices shown (3 of 3)]
[im 1/144]
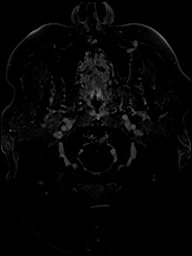
[im 18/144]
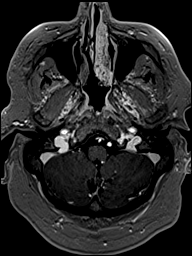
[im 36/144]
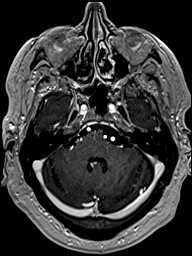
[im 54/144]
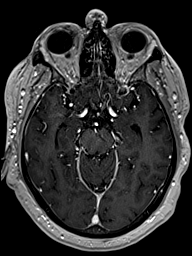
[im 72/144]
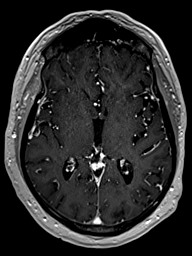
[im 90/144]
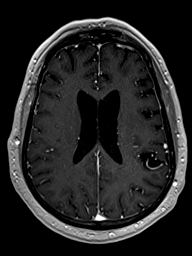
[im 108/144]
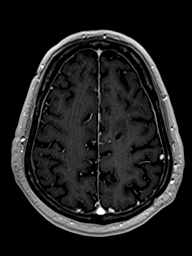
[im 126/144]
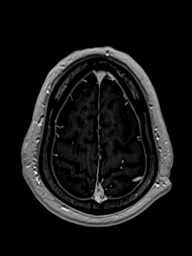
[im 144/144]
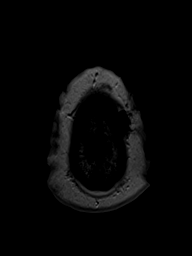

[Series 17: T1 post-contrast · coronal · 4.5mm · 0.72mm/px · 2 of 35 slices shown (1 of 2)]
[im 1/35]
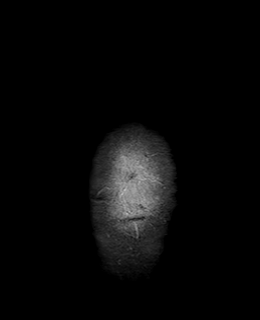
[im 35/35]
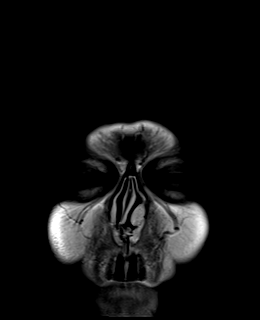

[Series 18: T1 post-contrast · sagittal · 4.0mm · 0.75mm/px · 2 of 31 slices shown (2 of 2)]
[im 1/31]
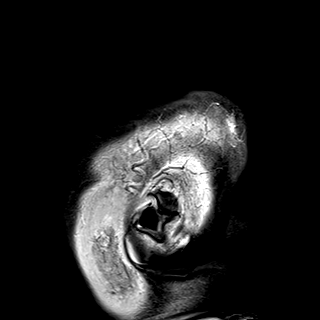
[im 31/31]
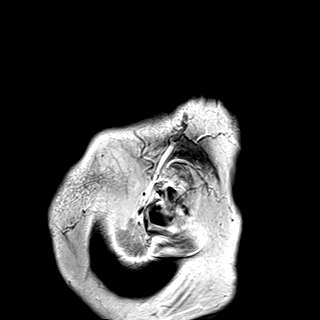

[48 of 48 positions shown; findings below may reference images not displayed]

FINDINGS: Brain: Marked interval improvement since the prior MRI of
11/09/2017. Multiple enhancing mass lesions have resolved.
Improvement in edema around multiple enhancing lesions compared with
the prior study. Small amount of stellate enhancement in the left
medial frontal lobe at the site of prior biopsy. Previously there
was masslike enhancement and surrounding edema in this area. This
could be related to treatment related enhancement versus residual
lymphoma. Continued follow-up suggested. No other abnormal
enhancement in the brain.

Ventricle size is normal. Biopsy sites in the left anterior frontal
lobe and left medial posterior frontal lobe are noted. Negative for
acute infarct.

Vascular: Normal arterial flow voids

Skull and upper cervical spine: No acute skeletal abnormality. Left
frontal convexity craniotomy for biopsy.

Sinuses/Orbits: Mild mucosal edema paranasal sinuses. Mild mastoid
effusion bilaterally. Normal orbit.

Other: None
IMPRESSION: Marked improvement compared with the prior study of 11/09/2017. Most
of the enhancing lesions have completely resolved. There is a small
amount of stellate enhancement in the left medial posterior frontal
lobe lesion at the site of prior biopsy. This may be related to
treatment related enhancement versus residual lymphoma. Continued
close follow-up warranted. No new lesions.

These results were called by telephone at the time of interpretation
on 02/11/2018 at [DATE] to Dr. M ALBERTO SRUR , who verbally
acknowledged these results.

## 2018-11-08 ENCOUNTER — Telehealth: Payer: Self-pay | Admitting: *Deleted

## 2018-11-08 MED ORDER — LEVETIRACETAM 500 MG PO TABS: 500.0000 mg | ORAL_TABLET | Freq: Two times a day (BID) | ORAL | 5 refills | Status: DC

## 2018-11-08 NOTE — Telephone Encounter (Signed)
Mr Kimbrell called for a refill of Keppra 500 mg. Is requesting it be sent to [facility]

## 2018-12-12 ENCOUNTER — Other Ambulatory Visit: Payer: Self-pay | Admitting: Radiation Therapy

## 2018-12-18 ENCOUNTER — Other Ambulatory Visit: Payer: Self-pay

## 2018-12-18 ENCOUNTER — Ambulatory Visit
Admission: RE | Admit: 2018-12-18 | Discharge: 2018-12-18 | Disposition: A | Discharge status: Home / Self Care | Payer: PPO | Source: Ambulatory Visit | Attending: Internal Medicine | Admitting: Internal Medicine

## 2018-12-18 DIAGNOSIS — C8589 Other specified types of non-Hodgkin lymphoma, extranodal and solid organ sites: Secondary | ICD-10-CM

## 2018-12-18 DIAGNOSIS — C8580 Other specified types of non-Hodgkin lymphoma, unspecified site: Secondary | ICD-10-CM | POA: Diagnosis not present

## 2018-12-18 MED ORDER — GADOBENATE DIMEGLUMINE 529 MG/ML IV SOLN
20.0000 mL | Freq: Once | INTRAVENOUS | Status: AC | PRN
  Administered 2018-12-18: 10:00:00 20 mL via INTRAVENOUS

## 2018-12-19 ENCOUNTER — Inpatient Hospital Stay: Payer: PPO | Attending: Internal Medicine

## 2018-12-19 DIAGNOSIS — C8589 Other specified types of non-Hodgkin lymphoma, extranodal and solid organ sites: Secondary | ICD-10-CM | POA: Insufficient documentation

## 2018-12-20 ENCOUNTER — Inpatient Hospital Stay: Payer: PPO

## 2018-12-20 ENCOUNTER — Inpatient Hospital Stay (HOSPITAL_BASED_OUTPATIENT_CLINIC_OR_DEPARTMENT_OTHER): Payer: PPO | Admitting: Internal Medicine

## 2018-12-20 ENCOUNTER — Other Ambulatory Visit: Payer: Self-pay

## 2018-12-20 VITALS — BP 148/81 | HR 65 | Temp 98.3°F | Resp 17 | Ht 75.0 in | Wt 280.6 lb

## 2018-12-20 DIAGNOSIS — C8589 Other specified types of non-Hodgkin lymphoma, extranodal and solid organ sites: Secondary | ICD-10-CM | POA: Diagnosis not present

## 2018-12-20 DIAGNOSIS — Z95828 Presence of other vascular implants and grafts: Secondary | ICD-10-CM

## 2018-12-20 LAB — CMP (CANCER CENTER ONLY)
ALT: 59 U/L — ABNORMAL HIGH (ref 0–44)
AST: 50 U/L — ABNORMAL HIGH (ref 15–41)
Albumin: 4.1 g/dL (ref 3.5–5.0)
Alkaline Phosphatase: 94 U/L (ref 38–126)
Anion gap: 8 (ref 5–15)
BUN: 13 mg/dL (ref 8–23)
CO2: 31 mmol/L (ref 22–32)
Calcium: 8.8 mg/dL — ABNORMAL LOW (ref 8.9–10.3)
Chloride: 101 mmol/L (ref 98–111)
Creatinine: 0.97 mg/dL (ref 0.61–1.24)
GFR, Est AFR Am: >60 mL/min (ref >60)
GFR, Estimated: >60 mL/min (ref >60)
Glucose, Bld: 191 mg/dL — ABNORMAL HIGH (ref 70–99)
Potassium: 4.2 mmol/L (ref 3.5–5.1)
Sodium: 140 mmol/L (ref 135–145)
Total Bilirubin: 0.5 mg/dL (ref 0.3–1.2)
Total Protein: 7.1 g/dL (ref 6.5–8.1)

## 2018-12-20 LAB — CBC WITH DIFFERENTIAL (CANCER CENTER ONLY)
Abs Immature Granulocytes: 0.02 10*3/uL (ref 0.00–0.07)
Basophils Absolute: 0 10*3/uL (ref 0.0–0.1)
Basophils Relative: 1 %
Eosinophils Absolute: 0.2 10*3/uL (ref 0.0–0.5)
Eosinophils Relative: 4 %
HCT: 47.3 % (ref 39.0–52.0)
Hemoglobin: 15.6 g/dL (ref 13.0–17.0)
Immature Granulocytes: 0 %
Lymphocytes Relative: 24 %
Lymphs Abs: 1.6 10*3/uL (ref 0.7–4.0)
MCH: 32.2 pg (ref 26.0–34.0)
MCHC: 33 g/dL (ref 30.0–36.0)
MCV: 97.7 fL (ref 80.0–100.0)
Monocytes Absolute: 0.6 10*3/uL (ref 0.1–1.0)
Monocytes Relative: 9 %
Neutro Abs: 3.9 10*3/uL (ref 1.7–7.7)
Neutrophils Relative %: 62 %
Platelet Count: 137 10*3/uL — ABNORMAL LOW (ref 150–400)
RBC: 4.84 MIL/uL (ref 4.22–5.81)
RDW: 12.3 % (ref 11.5–15.5)
WBC Count: 6.4 10*3/uL (ref 4.0–10.5)
nRBC: 0 % (ref 0.0–0.2)

## 2018-12-20 MED ORDER — SODIUM CHLORIDE 0.9% FLUSH
10.0000 mL | Freq: Once | INTRAVENOUS | Status: AC
  Administered 2018-12-20: 10 mL
  Filled 2018-12-20: qty 10

## 2018-12-20 MED ORDER — HEPARIN SOD (PORK) LOCK FLUSH 100 UNIT/ML IV SOLN
500.0000 [IU] | Freq: Once | INTRAVENOUS | Status: AC
  Administered 2018-12-20: 500 [IU]
  Filled 2018-12-20: qty 5

## 2018-12-20 NOTE — Progress Notes (Signed)
[facility] at [place] [place], [place] [address] [phone]   Interval Evaluation  Date of Service: 12/20/18 Patient Name: Tim [name] Patient MRN: [id number] Patient DOB: 13-Jan-1956 Provider: Ventura Sellers, MD  Identifying Statement:  Tim [name] is a 63 y.o. 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 No new or progressive neurologic deficits.  Occassional word finding difficulty, struggles a little with very fine motor skills. Port still in place.    Medications: Current Outpatient Medications on File Prior to Visit  Medication Sig Dispense Refill  . albuterol (PROVENTIL HFA;VENTOLIN HFA) 108 (90 Base) MCG/ACT inhaler Inhale 2 puffs into the lungs every 4 (four) hours as needed for wheezing or shortness of breath. 1 Inhaler 0  . allopurinol (ZYLOPRIM) 300 MG tablet Take 300 mg by mouth daily.    Marland Kitchen atenolol (TENORMIN) 100 MG tablet Take 50 mg by mouth daily.    Marland Kitchen b complex vitamins tablet Take 1 tablet by mouth daily.    . blood glucose meter kit and supplies KIT Dispense based on patient and insurance preference. Use up to four times daily as directed. (FOR ICD-9 250.00, 250.01). 1 each 0  . budesonide-formoterol (SYMBICORT) 160-4.5 MCG/ACT inhaler Take 2 puffs first thing in am and then another 2 puffs about 12 hours later. (Patient taking differently: Inhale 2 puffs into the lungs 2 (two) times daily. ) 1 Inhaler 12  . Insulin Isophane & Regular Human (NOVOLIN 70/30 FLEXPEN) (70-30) 100 UNIT/ML PEN Inject 15 Units into the skin 2 (two) times daily. 15 mL 11  . Insulin Pen Needle (PEN NEEDLES 3/16") 31G X 5 MM MISC 1 Container by Does not apply route 2 (two) times daily. 100 each 0  . levETIRAcetam (KEPPRA) 500 MG tablet Take 1 tablet (500 mg total) by mouth 2 (two) times daily. 60 tablet 5  . Multiple Vitamin (MULTIVITAMIN) tablet  Take 1 tablet by mouth daily.    . ondansetron (ZOFRAN) 8 MG tablet Take 1 tablet (8 mg total) by mouth 2 (two) times daily as needed. Start on the third day after chemotherapy. 30 tablet 1  . temozolomide (TEMODAR) 180 MG capsule Take 1 capsules (17m) by mouth for 5 days on, 23 days off, repeated every 28 days. May take on empty stomach at bedtime to reduce N/V 5 capsule 0  . temozolomide (TEMODAR) 250 MG capsule Take 1 capsules (2552m by mouth for 5 days on, 23 days off, repeated every 28 days. May take on empty stomach at bedtime to reduce N/V 5 capsule 0  . valsartan (DIOVAN) 160 MG tablet Take 160 mg by mouth daily.     Current Facility-Administered Medications on File Prior to Visit  Medication Dose Route Frequency Provider Last Rate Last Dose  . heparin lock flush 100 unit/mL  500 Units Intracatheter Once VaVentura SellersMD      . sodium chloride flush (NS) 0.9 % injection 10 mL  10 mL Intracatheter Once Vaslow, ZaAcey LavMD        Allergies: No Known Allergies   Past Medical History:  Past Medical History:  Diagnosis Date  . Arthritis   . [place] ([facility]  . COPD [facility]chronic obstructive pulmonary disease) ([facility]  . [facility]   patient states he was taken off meds at last appointment with PCP and is  diet controlled  . Dyspnea   . Gout   . Hypertension 08/02/2017  . Pneumonia    Past Surgical History: none    Social History:  Social History   Socioeconomic History  . Marital status: Significant Other    Spouse name: [name]  . Number of children: Not on file  . Years of education: 69  . Highest education level: Bachelor's degree (e.g., BA, AB, BS)  Occupational History  . 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 Financial resource strain: Not hard at all  . Food insecurity    Worry: Never true    Inability: Never true  . Transportation needs    Medical: No    Non-medical: No  Tobacco Use   . Smoking status: Former Smoker    Packs/day: 2.00    Years: 40.00    Pack years: 80.00    Types: Cigarettes    Quit date: 08/10/2011    Years since quitting: 7.3  . Smokeless tobacco: Never Used  Substance and Sexual Activity  . Alcohol use: Yes    Alcohol/week: 14.0 standard drinks    Types: 14 Cans of beer per week    Frequency: Never  . Drug use: Never  . Sexual activity: Not Currently  Lifestyle  . Physical activity    Days per week: 0 days    Minutes per session: 0 min  . Stress: Only a little  Relationships  . Social Herbalist on phone: Once a week    Gets together: Once a week    Attends religious service: Never    Active member of club or organization: No    Attends meetings of clubs or organizations: Never    Relationship status: Living with partner  . Intimate partner violence    Fear of current or ex partner: No    Emotionally abused: No    Physically abused: No    Forced sexual activity: No  Other Topics Concern  . Not on file  Social History Narrative  . Not on file   Family History:  Family History  Problem Relation Age of Onset  . Cancer Brother     Review of Systems: Constitutional: Denies fevers, chills or abnormal weight loss Eyes: Denies blurriness of vision Ears, nose, mouth, throat, and face: Denies mucositis or sore throat Respiratory: denies sob Cardiovascular: Denies palpitation, chest discomfort or lower extremity swelling Gastrointestinal:  Denies nausea, constipation, diarrhea GU: Denies dysuria or incontinence Skin: Denies abnormal skin rashes Neurological: Per HPI Musculoskeletal: Denies joint pain, back or neck discomfort. No decrease in ROM Behavioral/Psych: Denies anxiety, disturbance in thought content, and mood instability  Physical Exam: Vitals:   12/20/18 0958  BP: (!) 148/81  Pulse: 65  Resp: 17  Temp: 98.3 F (36.8 C)  SpO2: 95%   KPS: 80. General: Alert, cooperative, pleasant, in no acute distress  Head: Biopsy scar noted, dry and intact. EENT: No conjunctival injection or scleral icterus. Oral mucosa moist Lungs: Resp effort normal Cardiac: Regular rate and rhythm Abdomen: Soft, non-distended abdomen Skin: No rashes cyanosis or petechiae. Extremities: No clubbing or edema  Neurologic Exam: Mental Status: Awake, alert, attentive to examiner. Oriented to self and environment. Language fluent with intact comprehension, repetition, reading. Cranial Nerves: Visual acuity is grossly normal. Visual fields are full. Extra-ocular movements intact. No ptosis. Face is symmetric, tongue midline. Motor: Tone and bulk are normal. Full power in arms and legs.  Reflexes are symmetric, no pathologic reflexes present. Intact finger to nose bilaterally Sensory: Intact to light touch and temperature Gait: Normal, independent   Labs: I have reviewed the data as listed    Component Value Date/Time   NA 142 09/20/2018 1129   K 4.5 09/20/2018 1129   CL 102 09/20/2018 1129   CO2 27 09/20/2018 1129   GLUCOSE 130 (H) 09/20/2018 1129   BUN 13 09/20/2018 1129   CREATININE 0.89 09/20/2018 1129   CALCIUM 9.1 09/20/2018 1129   PROT 7.2 09/20/2018 1129   ALBUMIN 3.8 09/20/2018 1129   AST 74 (H) 09/20/2018 1129   ALT 85 (H) 09/20/2018 1129   ALKPHOS 120 09/20/2018 1129   BILITOT 0.5 09/20/2018 1129   GFRNONAA >60 09/20/2018 1129   GFRAA >60 09/20/2018 1129   Lab Results  Component Value Date   WBC 6.4 12/20/2018   NEUTROABS 3.9 12/20/2018   HGB 15.6 12/20/2018   HCT 47.3 12/20/2018   MCV 97.7 12/20/2018   PLT 137 (L) 12/20/2018    Imaging:  [facility] Clinician Interpretation: I have personally reviewed the CNS images as listed.  My interpretation, in the context of the patient's clinical presentation, is stable disease  Mr Jeri Cos Wo Contrast  Result Date: 12/18/2018 CLINICAL DATA:  63 year old male with primary CNS lymphoma. Restaging. EXAM: MRI HEAD WITHOUT AND WITH CONTRAST TECHNIQUE:  Multiplanar, multiecho pulse sequences of the brain and surrounding structures were obtained without and with intravenous contrast. CONTRAST:  19m MULTIHANCE GADOBENATE DIMEGLUMINE 529 MG/ML IV SOLN COMPARISON:  09/16/2018 and earlier. FINDINGS: Brain: Previous left superior convexity approach brain biopsy. No abnormal enhancement identified. Stable left superior frontal gyrus through left corona radiata T2 and FLAIR hyperintensity. No associated gyral enlargement. No suspicious diffusion changes. Stable hemosiderin. No new signal abnormality. No dural thickening. No restricted diffusion to suggest acute infarction. No midline shift, mass effect, evidence of mass lesion, ventriculomegaly, extra-axial collection or acute intracranial hemorrhage. Cervicomedullary junction and pituitary are within normal limits. Vascular: Major intracranial vascular flow voids are stable, with chronic intracranial artery tortuosity. The major dural venous sinuses are enhancing and appear to be patent. Skull and upper cervical spine: Negative visible cervical spine and spinal cord. Two left superior convexity burr holes. Visualized bone marrow signal is within normal limits. Sinuses/Orbits: Stable, mild to moderate maxillary alveolar recess mucosal thickening. Other: Visible internal auditory structures appear normal. Mastoids remain clear. No acute scalp or face soft tissue findings. IMPRESSION: Continued stable and satisfactory post treatment appearance of the brain. No new intracranial abnormality. Electronically Signed   By: HGenevie AnnM.D.   On: 12/18/2018 23:32     Assessment/Plan 1. Primary CNS lymphoma ([facility]  Mr. SPharois clinically and radiographically stable today.   We recommended he remain on observation, returning in 3 months with MRI brain for review.    Continue Keppra 2543mBID.  We appreciate the opportunity to participate in the care of Tim [name]  The total time spent in the encounter was 25  minutes and more than 50% was on counseling and review of test results   ZaVentura SellersMD Medical Director of Neuro-Oncology [facility] [place]8/11/20 10:09 AM

## 2018-12-21 ENCOUNTER — Telehealth: Payer: Self-pay | Admitting: Internal Medicine

## 2018-12-21 NOTE — Telephone Encounter (Signed)
Scheduled appt per 8/11 los  Spoke with patient and he is aware of his appt date and time

## 2019-01-17 DIAGNOSIS — E119 Type 2 diabetes mellitus without complications: Secondary | ICD-10-CM | POA: Diagnosis not present

## 2019-01-17 DIAGNOSIS — Z794 Long term (current) use of insulin: Secondary | ICD-10-CM | POA: Diagnosis not present

## 2019-02-13 ENCOUNTER — Other Ambulatory Visit: Payer: Self-pay | Admitting: Radiation Therapy

## 2019-03-24 ENCOUNTER — Other Ambulatory Visit: Payer: Self-pay

## 2019-03-24 ENCOUNTER — Ambulatory Visit
Admission: RE | Admit: 2019-03-24 | Discharge: 2019-03-24 | Disposition: A | Discharge status: Home / Self Care | Payer: PPO | Source: Ambulatory Visit | Attending: Internal Medicine | Admitting: Internal Medicine

## 2019-03-24 DIAGNOSIS — D496 Neoplasm of unspecified behavior of brain: Secondary | ICD-10-CM | POA: Diagnosis not present

## 2019-03-24 DIAGNOSIS — C8589 Other specified types of non-Hodgkin lymphoma, extranodal and solid organ sites: Secondary | ICD-10-CM

## 2019-03-24 MED ORDER — GADOBENATE DIMEGLUMINE 529 MG/ML IV SOLN
20.0000 mL | Freq: Once | INTRAVENOUS | Status: AC | PRN
  Administered 2019-03-24: 11:00:00 20 mL via INTRAVENOUS

## 2019-03-27 ENCOUNTER — Inpatient Hospital Stay: Payer: PPO | Attending: Internal Medicine

## 2019-03-27 DIAGNOSIS — C8589 Other specified types of non-Hodgkin lymphoma, extranodal and solid organ sites: Secondary | ICD-10-CM | POA: Insufficient documentation

## 2019-03-27 DIAGNOSIS — Z87891 Personal history of nicotine dependence: Secondary | ICD-10-CM | POA: Insufficient documentation

## 2019-03-28 ENCOUNTER — Inpatient Hospital Stay (HOSPITAL_BASED_OUTPATIENT_CLINIC_OR_DEPARTMENT_OTHER): Payer: PPO | Admitting: Internal Medicine

## 2019-03-28 ENCOUNTER — Other Ambulatory Visit: Payer: Self-pay

## 2019-03-28 ENCOUNTER — Inpatient Hospital Stay: Payer: PPO

## 2019-03-28 VITALS — BP 156/71 | HR 64 | Temp 98.2°F | Resp 17 | Ht 75.0 in | Wt 283.7 lb

## 2019-03-28 DIAGNOSIS — Z95828 Presence of other vascular implants and grafts: Secondary | ICD-10-CM

## 2019-03-28 DIAGNOSIS — Z87891 Personal history of nicotine dependence: Secondary | ICD-10-CM | POA: Diagnosis not present

## 2019-03-28 DIAGNOSIS — C8589 Other specified types of non-Hodgkin lymphoma, extranodal and solid organ sites: Secondary | ICD-10-CM

## 2019-03-28 MED ORDER — SODIUM CHLORIDE 0.9% FLUSH
10.0000 mL | Freq: Once | INTRAVENOUS | Status: AC
  Administered 2019-03-28: 10 mL
  Filled 2019-03-28: qty 10

## 2019-03-28 MED ORDER — HEPARIN SOD (PORK) LOCK FLUSH 100 UNIT/ML IV SOLN
500.0000 [IU] | Freq: Once | INTRAVENOUS | Status: AC
  Administered 2019-03-28: 11:00:00 500 [IU]
  Filled 2019-03-28: qty 5

## 2019-03-28 NOTE — Progress Notes (Signed)
[facility] at [address] [place], [place] [address] [phone]   Interval Evaluation  Date of Service: 03/28/19 Patient Name: Tim [name] Patient MRN: [id number] Patient DOB: June 02, 1955 Provider: Ventura Sellers, MD  Identifying Statement:  Tim [name] is a 63 y.o. 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 No new or progressive neurologic deficits.  Occassional word finding difficulty, struggles a little with very fine motor skills. Port still in place.    Medications: Current Outpatient Medications on File Prior to Visit  Medication Sig Dispense Refill  . albuterol (PROVENTIL HFA;VENTOLIN HFA) 108 (90 Base) MCG/ACT inhaler Inhale 2 puffs into the lungs every 4 (four) hours as needed for wheezing or shortness of breath. 1 Inhaler 0  . allopurinol (ZYLOPRIM) 300 MG tablet Take 300 mg by mouth daily.    Marland Kitchen atenolol (TENORMIN) 100 MG tablet Take 50 mg by mouth daily.    Marland Kitchen b complex vitamins tablet Take 1 tablet by mouth daily.    . blood glucose meter kit and supplies KIT Dispense based on patient and insurance preference. Use up to four times daily as directed. (FOR ICD-9 250.00, 250.01). 1 each 0  . budesonide-formoterol (SYMBICORT) 160-4.5 MCG/ACT inhaler Take 2 puffs first thing in am and then another 2 puffs about 12 hours later. (Patient taking differently: Inhale 2 puffs into the lungs 2 (two) times daily. ) 1 Inhaler 12  . Insulin Isophane & Regular Human (NOVOLIN 70/30 FLEXPEN) (70-30) 100 UNIT/ML PEN Inject 15 Units into the skin 2 (two) times daily. 15 mL 11  . Insulin Pen Needle (PEN NEEDLES 3/16") 31G X 5 MM MISC 1 Container by Does not apply route 2 (two) times daily. 100 each 0  . levETIRAcetam (KEPPRA) 500 MG tablet Take 1 tablet (500 mg total) by mouth 2 (two) times daily. 60 tablet 5  . Multiple Vitamin (MULTIVITAMIN) tablet  Take 1 tablet by mouth daily.    . ondansetron (ZOFRAN) 8 MG tablet Take 1 tablet (8 mg total) by mouth 2 (two) times daily as needed. Start on the third day after chemotherapy. 30 tablet 1  . temozolomide (TEMODAR) 180 MG capsule Take 1 capsules ('180mg'$ ) by mouth for 5 days on, 23 days off, repeated every 28 days. May take on empty stomach at bedtime to reduce N/V 5 capsule 0  . temozolomide (TEMODAR) 250 MG capsule Take 1 capsules ('250mg'$ ) by mouth for 5 days on, 23 days off, repeated every 28 days. May take on empty stomach at bedtime to reduce N/V 5 capsule 0  . valsartan (DIOVAN) 160 MG tablet Take 160 mg by mouth daily.     Current Facility-Administered Medications on File Prior to Visit  Medication Dose Route Frequency Provider Last Rate Last Dose  . heparin lock flush 100 unit/mL  500 Units Intracatheter Once Ventura Sellers, MD      . sodium chloride flush (NS) 0.9 % injection 10 mL  10 mL Intracatheter Once Vaslow, [name], MD        Allergies: No Known Allergies   Past Medical History:  Past Medical History:  Diagnosis Date  . Arthritis   . Brain tumor ([place])   . COPD (chronic obstructive pulmonary disease) ([place])   . Diabetes mellitus without complication [facility])    patient states he was taken off meds at last appointment with PCP and is  diet controlled  . Dyspnea   . Gout   . Hypertension 08/02/2017  . Pneumonia    Past Surgical History: none    Social History:  Social History   Socioeconomic History  . Marital status: Significant Other    Spouse name: [name]  . Number of children: Not on file  . Years of education: 22  . Highest education level: Bachelor's degree (e.g., BA, AB, BS)  Occupational History  . 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 Financial resource strain: Not hard at all  . Food insecurity    Worry: Never true    Inability: Never true  . Transportation needs    Medical: No    Non-medical: No  Tobacco Use   . Smoking status: Former Smoker    Packs/day: 2.00    Years: 40.00    Pack years: 80.00    Types: Cigarettes    Quit date: 08/10/2011    Years since quitting: 7.6  . Smokeless tobacco: Never Used  Substance and Sexual Activity  . Alcohol use: Yes    Alcohol/week: 14.0 standard drinks    Types: 14 Cans of beer per week    Frequency: Never  . Drug use: Never  . Sexual activity: Not Currently  Lifestyle  . Physical activity    Days per week: 0 days    Minutes per session: 0 min  . Stress: Only a little  Relationships  . Social Herbalist on phone: Once a week    Gets together: Once a week    Attends religious service: Never    Active member of club or organization: No    Attends meetings of clubs or organizations: Never    Relationship status: Living with partner  . Intimate partner violence    Fear of current or ex partner: No    Emotionally abused: No    Physically abused: No    Forced sexual activity: No  Other Topics Concern  . Not on file  Social History Narrative  . Not on file   Family History:  Family History  Problem Relation Age of Onset  . Cancer Brother     Review of Systems: Constitutional: Denies fevers, chills or abnormal weight loss Eyes: Denies blurriness of vision Ears, nose, mouth, throat, and face: Denies mucositis or sore throat Respiratory: denies sob Cardiovascular: Denies palpitation, chest discomfort or lower extremity swelling Gastrointestinal:  Denies nausea, constipation, diarrhea GU: Denies dysuria or incontinence Skin: Denies abnormal skin rashes Neurological: Per HPI Musculoskeletal: Denies joint pain, back or neck discomfort. No decrease in ROM Behavioral/Psych: Denies anxiety, disturbance in thought content, and mood instability  Physical Exam: Vitals:   03/28/19 1011  BP: (!) 156/71  Pulse: 64  Resp: 17  Temp: 98.2 F (36.8 C)  SpO2: 94%   KPS: 80. General: Alert, cooperative, pleasant, in no acute distress  Head: Biopsy scar noted, dry and intact. EENT: No conjunctival injection or scleral icterus. Oral mucosa moist Lungs: Resp effort normal Cardiac: Regular rate and rhythm Abdomen: Soft, non-distended abdomen Skin: No rashes cyanosis or petechiae. Extremities: No clubbing or edema  Neurologic Exam: Mental Status: Awake, alert, attentive to examiner. Oriented to self and environment. Language fluent with intact comprehension, repetition, reading. Cranial Nerves: Visual acuity is grossly normal. Visual fields are full. Extra-ocular movements intact. No ptosis. Face is symmetric, tongue midline. Motor: Tone and bulk are normal. Full power in arms and legs.  Reflexes are symmetric, no pathologic reflexes present. Intact finger to nose bilaterally Sensory: Intact to light touch and temperature Gait: Normal, independent   Labs: I have reviewed the data as listed    Component Value Date/Time   NA 140 12/20/2018 0929   K 4.2 12/20/2018 0929   CL 101 12/20/2018 0929   CO2 31 12/20/2018 0929   GLUCOSE 191 (H) 12/20/2018 0929   BUN 13 12/20/2018 0929   CREATININE 0.97 12/20/2018 0929   CALCIUM 8.8 (L) 12/20/2018 0929   PROT 7.1 12/20/2018 0929   ALBUMIN 4.1 12/20/2018 0929   AST 50 (H) 12/20/2018 0929   ALT 59 (H) 12/20/2018 0929   ALKPHOS 94 12/20/2018 0929   BILITOT 0.5 12/20/2018 0929   GFRNONAA >60 12/20/2018 0929   GFRAA >60 12/20/2018 0929   Lab Results  Component Value Date   WBC 6.4 12/20/2018   NEUTROABS 3.9 12/20/2018   HGB 15.6 12/20/2018   HCT 47.3 12/20/2018   MCV 97.7 12/20/2018   PLT 137 (L) 12/20/2018    Imaging:  [facility] Clinician Interpretation: I have personally reviewed the CNS images as listed.  My interpretation, in the context of the patient's clinical presentation, is stable disease  Mr Jeri Cos Wo Contrast  Result Date: 03/24/2019 CLINICAL DATA:  Neoplasm: Head, CNS, Rx monitor or follow-up. Additional history provided: Diagnosed with primary CNS  lymphoma, restaging, history of biopsy x2. EXAM: MRI HEAD WITHOUT AND WITH CONTRAST TECHNIQUE: Multiplanar, multiecho pulse sequences of the brain and surrounding structures were obtained without and with intravenous contrast. CONTRAST:  71m MULTIHANCE GADOBENATE DIMEGLUMINE 529 MG/ML IV SOLN COMPARISON:  Brain MRI 12/18/2018 FINDINGS: Brain: Unchanged sequela previous left superior convexity approach brain biopsies. Stable appearance of T2/FLAIR hyperintensity within the left superior frontal gyrus and left corona radiata. No associated abnormal enhancement or suspicious restricted diffusion. No dural thickening. Unchanged chronic hemosiderin deposition at the biopsy sites. No new focal of parenchymal signal abnormality. No evidence of acute infarct. No midline shift or extra-axial fluid collection. Vascular: Flow voids maintained within the proximal large arterial vessels. Expected enhancement within the dural venous sinuses. Skull and upper cervical spine: No focal marrow lesion. Two left superior convexity burr holes. Sinuses/Orbits: Visualized orbits demonstrate no acute abnormality. Mild scattered paranasal sinus mucosal thickening. No significant mastoid effusion. IMPRESSION: Stable and satisfactory post treatment appearance of the brain. No new intracranial abnormality. Electronically Signed   By: KKellie SimmeringDO   On: 03/24/2019 12:40     Assessment/Plan 1. Primary CNS lymphoma ([facility]  Mr. SZainois clinically and radiographically stable today.   We recommended he remain on observation, returning in 3 months with MRI brain for review.    Continue Keppra 2528mBID.  Port flush today.  We appreciate the opportunity to participate in the care of ChIDRISS QUACKENBUSH  The total time spent in the encounter was 25 minutes and more than 50% was on counseling and review of test results   ZaVentura SellersMD Medical Director of Neuro-Oncology [facility]1/17/20  3:56 PM

## 2019-03-29 ENCOUNTER — Telehealth: Payer: Self-pay | Admitting: Internal Medicine

## 2019-03-29 NOTE — Telephone Encounter (Signed)
Scheduled appt per 11/17 los.  Left a VM of the appt date and time.

## 2019-04-03 ENCOUNTER — Other Ambulatory Visit: Payer: Self-pay | Admitting: Internal Medicine

## 2019-04-03 MED ORDER — METHYLPHENIDATE HCL 5 MG PO TABS: 5.0000 mg | ORAL_TABLET | Freq: Two times a day (BID) | ORAL | 0 refills | Status: DC

## 2019-04-04 ENCOUNTER — Telehealth: Payer: Self-pay | Admitting: Internal Medicine

## 2019-04-04 ENCOUNTER — Telehealth: Payer: Self-pay | Admitting: *Deleted

## 2019-04-04 NOTE — Telephone Encounter (Signed)
Prior authorization needed for Ritalin dose.  Authorization form received via fax and placed in Prior Auth folder to be completed.

## 2019-04-04 NOTE — Telephone Encounter (Signed)
Scheduled appt per 11/24 sch message  - per pt would rather wait until Feb appt with [facility] . appt scheduled and message to RN to let her know we did not schedule per sch message.

## 2019-04-07 ENCOUNTER — Telehealth: Payer: Self-pay | Admitting: *Deleted

## 2019-04-07 NOTE — Telephone Encounter (Signed)
Methylphenidate 5 mg tablet order denied for "Non-medically accepted indication."

## 2019-04-10 ENCOUNTER — Other Ambulatory Visit: Payer: Self-pay | Admitting: Internal Medicine

## 2019-04-10 DIAGNOSIS — R530 Neoplastic (malignant) related fatigue: Secondary | ICD-10-CM

## 2019-04-10 DIAGNOSIS — R5383 Other fatigue: Secondary | ICD-10-CM

## 2019-04-10 DIAGNOSIS — T451X5A Adverse effect of antineoplastic and immunosuppressive drugs, initial encounter: Secondary | ICD-10-CM

## 2019-04-10 MED ORDER — METHYLPHENIDATE HCL 5 MG PO TABS: 5.0000 mg | ORAL_TABLET | Freq: Two times a day (BID) | ORAL | 0 refills | Status: DC

## 2019-05-12 DIAGNOSIS — R9430 Abnormal result of cardiovascular function study, unspecified: Secondary | ICD-10-CM

## 2019-05-12 DIAGNOSIS — IMO0002 Reserved for concepts with insufficient information to code with codable children: Secondary | ICD-10-CM

## 2019-05-12 HISTORY — DX: Abnormal result of cardiovascular function study, unspecified: R94.30

## 2019-05-12 HISTORY — DX: Reserved for concepts with insufficient information to code with codable children: IMO0002

## 2019-05-18 ENCOUNTER — Other Ambulatory Visit: Payer: Self-pay

## 2019-05-18 ENCOUNTER — Inpatient Hospital Stay: Payer: PPO | Attending: Internal Medicine

## 2019-05-18 DIAGNOSIS — Z452 Encounter for adjustment and management of vascular access device: Secondary | ICD-10-CM | POA: Diagnosis not present

## 2019-05-18 DIAGNOSIS — C8589 Other specified types of non-Hodgkin lymphoma, extranodal and solid organ sites: Secondary | ICD-10-CM | POA: Diagnosis not present

## 2019-05-18 DIAGNOSIS — Z95828 Presence of other vascular implants and grafts: Secondary | ICD-10-CM

## 2019-05-18 MED ORDER — SODIUM CHLORIDE 0.9% FLUSH
10.0000 mL | Freq: Once | INTRAVENOUS | Status: AC
  Administered 2019-05-18: 14:00:00 10 mL
  Filled 2019-05-18: qty 10

## 2019-05-18 MED ORDER — HEPARIN SOD (PORK) LOCK FLUSH 100 UNIT/ML IV SOLN
500.0000 [IU] | Freq: Once | INTRAVENOUS | Status: AC
  Administered 2019-05-18: 500 [IU]
  Filled 2019-05-18: qty 5

## 2019-06-07 ENCOUNTER — Other Ambulatory Visit: Payer: Self-pay | Admitting: Radiation Therapy

## 2019-06-30 ENCOUNTER — Other Ambulatory Visit: Payer: PPO

## 2019-07-04 ENCOUNTER — Inpatient Hospital Stay: Payer: PPO | Attending: Internal Medicine

## 2019-07-04 ENCOUNTER — Ambulatory Visit: Payer: PPO | Admitting: Internal Medicine

## 2019-07-04 ENCOUNTER — Other Ambulatory Visit: Payer: Self-pay

## 2019-07-04 DIAGNOSIS — Z452 Encounter for adjustment and management of vascular access device: Secondary | ICD-10-CM | POA: Diagnosis not present

## 2019-07-04 DIAGNOSIS — Z95828 Presence of other vascular implants and grafts: Secondary | ICD-10-CM

## 2019-07-04 DIAGNOSIS — C8589 Other specified types of non-Hodgkin lymphoma, extranodal and solid organ sites: Secondary | ICD-10-CM | POA: Diagnosis not present

## 2019-07-04 MED ORDER — HEPARIN SOD (PORK) LOCK FLUSH 100 UNIT/ML IV SOLN
500.0000 [IU] | Freq: Once | INTRAVENOUS | Status: AC
  Administered 2019-07-04: 10:00:00 500 [IU]
  Filled 2019-07-04: qty 5

## 2019-07-04 MED ORDER — SODIUM CHLORIDE 0.9% FLUSH
10.0000 mL | Freq: Once | INTRAVENOUS | Status: AC
  Administered 2019-07-04: 10:00:00 10 mL
  Filled 2019-07-04: qty 10

## 2019-07-04 NOTE — Patient Instructions (Signed)

## 2019-07-17 DIAGNOSIS — Z794 Long term (current) use of insulin: Secondary | ICD-10-CM | POA: Diagnosis not present

## 2019-07-17 DIAGNOSIS — E119 Type 2 diabetes mellitus without complications: Secondary | ICD-10-CM | POA: Diagnosis not present

## 2019-07-17 DIAGNOSIS — Z87891 Personal history of nicotine dependence: Secondary | ICD-10-CM | POA: Diagnosis not present

## 2019-07-17 DIAGNOSIS — I10 Essential (primary) hypertension: Secondary | ICD-10-CM | POA: Diagnosis not present

## 2019-07-17 DIAGNOSIS — E1142 Type 2 diabetes mellitus with diabetic polyneuropathy: Secondary | ICD-10-CM | POA: Diagnosis not present

## 2019-07-24 ENCOUNTER — Other Ambulatory Visit: Payer: Self-pay

## 2019-07-24 ENCOUNTER — Ambulatory Visit
Admission: RE | Admit: 2019-07-24 | Discharge: 2019-07-24 | Disposition: A | Discharge status: Home / Self Care | Payer: PPO | Source: Ambulatory Visit | Attending: Internal Medicine | Admitting: Internal Medicine

## 2019-07-24 DIAGNOSIS — C8589 Other specified types of non-Hodgkin lymphoma, extranodal and solid organ sites: Secondary | ICD-10-CM | POA: Diagnosis not present

## 2019-07-24 MED ORDER — GADOBENATE DIMEGLUMINE 529 MG/ML IV SOLN
20.0000 mL | Freq: Once | INTRAVENOUS | Status: AC | PRN
  Administered 2019-07-24: 20 mL via INTRAVENOUS

## 2019-07-25 ENCOUNTER — Telehealth: Payer: Self-pay | Admitting: Internal Medicine

## 2019-07-25 ENCOUNTER — Other Ambulatory Visit: Payer: Self-pay

## 2019-07-25 ENCOUNTER — Inpatient Hospital Stay: Payer: PPO | Attending: Internal Medicine | Admitting: Internal Medicine

## 2019-07-25 VITALS — BP 152/68 | HR 60 | Temp 99.1°F | Resp 18 | Ht 75.0 in | Wt 289.1 lb

## 2019-07-25 DIAGNOSIS — C8589 Other specified types of non-Hodgkin lymphoma, extranodal and solid organ sites: Secondary | ICD-10-CM | POA: Diagnosis not present

## 2019-07-25 DIAGNOSIS — Z95828 Presence of other vascular implants and grafts: Secondary | ICD-10-CM | POA: Diagnosis not present

## 2019-07-25 DIAGNOSIS — Z87891 Personal history of nicotine dependence: Secondary | ICD-10-CM | POA: Diagnosis not present

## 2019-07-25 NOTE — Telephone Encounter (Signed)
Scheduled appt per 3/16 los.  Sent a message to HIM pool to get a calendar mailed out,

## 2019-07-25 NOTE — Progress Notes (Signed)
[facility] at [place],  [address] [phone]   Interval Evaluation  Date of Service: 07/25/19 Patient Name: Tim [name] Patient MRN: [id number] Patient DOB: [birth year] Provider: Ventura Sellers, MD  Identifying Statement:  Tim [name] is a 64 y.o. 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 No new or progressive neurologic deficits.  Occassional word finding difficulty, struggles a little with very fine motor skills. Port still in place.    Medications: Current Outpatient Medications on File Prior to Visit  Medication Sig Dispense Refill  . albuterol (PROVENTIL HFA;VENTOLIN HFA) 108 (90 Base) MCG/ACT inhaler Inhale 2 puffs into the lungs every 4 (four) hours as needed for wheezing or shortness of breath. 1 Inhaler 0  . allopurinol (ZYLOPRIM) 300 MG tablet Take 300 mg by mouth daily.    Marland Kitchen atenolol (TENORMIN) 100 MG tablet Take 50 mg by mouth daily.    Marland Kitchen b complex vitamins tablet Take 1 tablet by mouth daily.    . blood glucose meter kit and supplies KIT Dispense based on patient and insurance preference. Use up to four times daily as directed. (FOR ICD-9 250.00, 250.01). 1 each 0  . budesonide-formoterol (SYMBICORT) 160-4.5 MCG/ACT inhaler Take 2 puffs first thing in am and then another 2 puffs about 12 hours later. (Patient taking differently: Inhale 2 puffs into the lungs 2 (two) times daily. ) 1 Inhaler 12  . Insulin Isophane & Regular Human (NOVOLIN 70/30 FLEXPEN) (70-30) 100 UNIT/ML PEN Inject 15 Units into the skin 2 (two) times daily. 15 mL 11  . Insulin Pen Needle (PEN NEEDLES 3/16") 31G X 5 MM MISC 1 Container by Does not apply route 2 (two) times daily. 100 each 0  . levETIRAcetam (KEPPRA) 500 MG tablet Take 1 tablet (500 mg total) by mouth 2 (two) times daily. 60 tablet 5  . methylphenidate (RITALIN) 5 MG tablet  Take 1 tablet (5 mg total) by mouth 2 (two) times daily. 60 tablet 0  . Multiple Vitamin (MULTIVITAMIN) tablet Take 1 tablet by mouth daily.    . ondansetron (ZOFRAN) 8 MG tablet Take 1 tablet (8 mg total) by mouth 2 (two) times daily as needed. Start on the third day after chemotherapy. 30 tablet 1  . temozolomide (TEMODAR) 180 MG capsule Take 1 capsules (184m) by mouth for 5 days on, 23 days off, repeated every 28 days. May take on empty stomach at bedtime to reduce N/V 5 capsule 0  . temozolomide (TEMODAR) 250 MG capsule Take 1 capsules (2542m by mouth for 5 days on, 23 days off, repeated every 28 days. May take on empty stomach at bedtime to reduce N/V 5 capsule 0  . valsartan (DIOVAN) 160 MG tablet Take 160 mg by mouth daily.     Current Facility-Administered Medications on File Prior to Visit  Medication Dose Route Frequency Provider Last Rate Last Admin  . heparin lock flush 100 unit/mL  500 Units Intracatheter Once VaVentura SellersMD      . sodium chloride flush (NS) 0.9 % injection 10 mL  10 mL Intracatheter Once Eniyah Eastmond, ZaAcey LavMD        Allergies: No Known Allergies   Past Medical History:  Past Medical History:  Diagnosis Date  . Arthritis   . [place]  . [place] disease[place]  .  Diabetes mellitus without complication [facility])    patient states he was taken off meds at last appointment with PCP and is diet controlled  . Dyspnea   . Gout   . Hypertension 08/02/2017  . Pneumonia    Past Surgical History: none    Social History:  Social History   Socioeconomic History  . Marital status: Significant Other    Spouse name: Tim [name]  . Number of children: Not on file  . Years of education: 3  . Highest education level: Bachelor's degree (e.g., BA, AB, BS)  Occupational History  . 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 Smoking status: Former Smoker    Packs/day: 2.00    Years: 40.00     Pack years: 80.00    Types: Cigarettes    Quit date: 08/10/2011    Years since quitting: 7.9  . Smokeless tobacco: Never Used  Substance and Sexual Activity  . Alcohol use: Yes    Alcohol/week: 14.0 standard drinks    Types: 14 Cans of beer per week  . Drug use: Never  . Sexual activity: Not Currently  Other Topics Concern  . Not on file  Social History Narrative  . Not on file   Social Determinants of Health   Financial Resource Strain:   . Difficulty of Paying Living Expenses:   Food Insecurity:   . Worried About Charity fundraiser in the Last Year:   . Arboriculturist in the Last Year:   Transportation Needs:   . Film/video editor (Medical):   Marland Kitchen Lack of Transportation (Non-Medical):   Physical Activity:   . Days of Exercise per Week:   . Minutes of Exercise per Session:   Stress:   . Feeling of Stress :   Social Connections:   . Frequency of Communication with Friends and Family:   . Frequency of Social Gatherings with Friends and Family:   . Attends Religious Services:   . Active Member of Clubs or Organizations:   . Attends Archivist Meetings:   Marland Kitchen Marital Status:   Intimate Partner Violence:   . Fear of Current or Ex-Partner:   . Emotionally Abused:   Marland Kitchen Physically Abused:   . Sexually Abused:    Family History:  Family History  Problem Relation Age of Onset  . Cancer Brother     Review of Systems: Constitutional: Denies fevers, chills or abnormal weight loss Eyes: Denies blurriness of vision Ears, nose, mouth, throat, and face: Denies mucositis or sore throat Respiratory: denies sob Cardiovascular: Denies palpitation, chest discomfort or lower extremity swelling Gastrointestinal:  Denies nausea, constipation, diarrhea GU: Denies dysuria or incontinence Skin: Denies abnormal skin rashes Neurological: Per HPI Musculoskeletal: Denies joint pain, back or neck discomfort. No decrease in ROM Behavioral/Psych: Denies anxiety, disturbance in  thought content, and mood instability  Physical Exam: Vitals:   07/25/19 1055  BP: (!) 152/68  Pulse: 60  Resp: 18  Temp: 99.1 F (37.3 C)  SpO2: 92%   KPS: 80. General: Alert, cooperative, pleasant, in no acute distress Head: Biopsy scar noted, dry and intact. EENT: No conjunctival injection or scleral icterus. Oral mucosa moist Lungs: Resp effort normal Cardiac: Regular rate and rhythm Abdomen: Soft, non-distended abdomen Skin: No rashes cyanosis or petechiae. Extremities: No clubbing or edema  Neurologic Exam: Mental Status: Awake, alert, attentive to examiner. Oriented to self and environment. Language fluent with intact comprehension, repetition, reading. Cranial  Nerves: Visual acuity is grossly normal. Visual fields are full. Extra-ocular movements intact. No ptosis. Face is symmetric, tongue midline. Motor: Tone and bulk are normal. Full power in arms and legs. Reflexes are symmetric, no pathologic reflexes present. Intact finger to nose bilaterally Sensory: Intact to light touch and temperature Gait: Normal, independent   Labs: I have reviewed the data as listed    Component Value Date/Time   NA 140 12/20/2018 0929   K 4.2 12/20/2018 0929   CL 101 12/20/2018 0929   CO2 31 12/20/2018 0929   GLUCOSE 191 (H) 12/20/2018 0929   BUN 13 12/20/2018 0929   CREATININE 0.97 12/20/2018 0929   CALCIUM 8.8 (L) 12/20/2018 0929   PROT 7.1 12/20/2018 0929   ALBUMIN 4.1 12/20/2018 0929   AST 50 (H) 12/20/2018 0929   ALT 59 (H) 12/20/2018 0929   ALKPHOS 94 12/20/2018 0929   BILITOT 0.5 12/20/2018 0929   GFRNONAA >60 12/20/2018 0929   GFRAA >60 12/20/2018 0929   Lab Results  Component Value Date   WBC 6.4 12/20/2018   NEUTROABS 3.9 12/20/2018   HGB 15.6 12/20/2018   HCT 47.3 12/20/2018   MCV 97.7 12/20/2018   PLT 137 (L) 12/20/2018    Imaging:  [facility] Clinician Interpretation: I have personally reviewed the CNS images as listed.  My interpretation, in the context of  the patient's clinical presentation, is stable disease  MR Brain W Wo Contrast  Result Date: 07/24/2019 CLINICAL DATA:  Follow-up CNS lymphoma. EXAM: MRI HEAD WITHOUT AND WITH CONTRAST TECHNIQUE: Multiplanar, multiecho pulse sequences of the brain and surrounding structures were obtained without and with intravenous contrast. CONTRAST:  65m MULTIHANCE GADOBENATE DIMEGLUMINE 529 MG/ML IV SOLN COMPARISON:  03/24/2019 FINDINGS: Brain: Diffusion imaging does not show any acute or subacute infarction or other cause of restricted diffusion. The brainstem and cerebellum are normal. Cerebral hemispheres show chronic encephalomalacia and gliosis of the left posterior frontal vertex related to previous biopsy and treated tumor. No evidence of residual or recurrent mass lesion. Similar small focus at the more anterior left frontal vertex is stable. Other foci of disease seen in 2019 in the deep brain and in the right posterior parietal lobe are resolved without residual MR findings. No abnormal enhancing tissue or mass-effect at either location or elsewhere. No abnormal leptomeningeal enhancement. Vascular: Major vessels at the base of the brain show flow. Skull and upper cervical spine: Otherwise negative Sinuses/Orbits: Clear/normal Other: None IMPRESSION: No evidence residual active disease or recurrent disease. Areas of previous post biopsy defect with regional gliosis at the left frontal vertex and left posterior frontal vertex are stable. No abnormal contrast enhancement. No new lesion elsewhere. Electronically Signed   By: MNelson ChimesM.D.   On: 07/24/2019 15:35     Assessment/Plan 1. Primary CNS lymphoma ([facility]  Mr. SRacicotis clinically and radiographically stable today.   We recommended he remain on observation, returning in 3 months with MRI brain for review.    Continue Keppra 2572mBID.  Port flush in 2-3 weeks.  We appreciate the opportunity to participate in the care of ChDELAND [name]   The total time spent in the encounter was 30 minutes and more than 50% was on counseling and review of test results   ZaVentura SellersMD Medical Director of Neuro-Oncology CoUniversity [facility] WeWatertown3/16/21 10:45 AM

## 2019-07-31 ENCOUNTER — Inpatient Hospital Stay: Payer: PPO

## 2019-07-31 DIAGNOSIS — H02834 Dermatochalasis of left upper eyelid: Secondary | ICD-10-CM | POA: Diagnosis not present

## 2019-07-31 DIAGNOSIS — E119 Type 2 diabetes mellitus without complications: Secondary | ICD-10-CM | POA: Diagnosis not present

## 2019-07-31 DIAGNOSIS — H02831 Dermatochalasis of right upper eyelid: Secondary | ICD-10-CM | POA: Diagnosis not present

## 2019-07-31 DIAGNOSIS — H5213 Myopia, bilateral: Secondary | ICD-10-CM | POA: Diagnosis not present

## 2019-08-08 DIAGNOSIS — J449 Chronic obstructive pulmonary disease, unspecified: Secondary | ICD-10-CM | POA: Diagnosis not present

## 2019-08-08 DIAGNOSIS — R0600 Dyspnea, unspecified: Secondary | ICD-10-CM | POA: Diagnosis not present

## 2019-08-25 DIAGNOSIS — Z87891 Personal history of nicotine dependence: Secondary | ICD-10-CM | POA: Diagnosis not present

## 2019-08-25 DIAGNOSIS — Z9981 Dependence on supplemental oxygen: Secondary | ICD-10-CM | POA: Diagnosis not present

## 2019-08-25 DIAGNOSIS — J9611 Chronic respiratory failure with hypoxia: Secondary | ICD-10-CM | POA: Diagnosis not present

## 2019-08-25 DIAGNOSIS — J449 Chronic obstructive pulmonary disease, unspecified: Secondary | ICD-10-CM | POA: Diagnosis not present

## 2019-08-30 DIAGNOSIS — J449 Chronic obstructive pulmonary disease, unspecified: Secondary | ICD-10-CM | POA: Diagnosis not present

## 2019-09-03 NOTE — Progress Notes (Signed)
Cardiology Office Note:    Date:  09/04/2019   ID:  Tim [name], DOB 08-02-1955, MRN [id number]  PCP:  Bartholome Bill, MD  Cardiologist:  No primary care provider on file.  Electrophysiologist:  None   Referring MD: Bartholome Bill, MD   Chief Complaint  Patient presents with  . Shortness of Breath    History of Present Illness:    Tim [name] is a 64 y.o. male with a hx of COPD, diabetes, hypertension, CNS lymphoma who is referred by Dr. Luciana Axe for evaluation of dyspnea on exertion.  He reports that he gets short of breath with minimal exertion.  States that walking to the bathroom and back causes him to become short of breath.  He has to rest when walking at the grocery store.  Denies any chest pain.  Reports that he has tried statins on 2 different occasions but was unable to tolerate, though he does not remember the side effects that he had.  He does not exercise.  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  He smoked at least 2 packs/day x 40 years, but quit 8 years ago.  No family history of heart disease in his immediate family.  TTE 12/2017 showed normal systolic function, mild left atrial dilatation, mild MR.  Past Medical History:  Diagnosis Date  . Arthritis   . Brain tumor ([place])   . COPD (chronic obstructive pulmonary disease) ([place])   . Diabetes mellitus without complication [facility])    patient states he was taken off meds at last appointment with PCP and is diet controlled  . Dyspnea   . Gout   . Hypertension 08/02/2017  . Pneumonia     Past Surgical History:  Procedure Laterality Date  . APPLICATION OF CRANIAL NAVIGATION Left 08/30/2017   Procedure: APPLICATION OF CRANIAL NAVIGATION;  Surgeon: Erline Levine, MD;  Location: [place];  Service: Neurosurgery;  Laterality: Left;  . IR IMAGING GUIDED PORT INSERTION  12/27/2017  . STERIOTACTIC STIMULATOR INSERTION Left 08/30/2017     Procedure: LEFT Frontal Sterotactic Brain Biopsy with BrainLab;  Surgeon: Erline Levine, MD;  Location: [place];  Service: Neurosurgery;  Laterality: Left;    Current Medications: Current Meds  Medication Sig  . albuterol (PROVENTIL HFA;VENTOLIN HFA) 108 (90 Base) MCG/ACT inhaler Inhale 2 puffs into the lungs every 4 (four) hours as needed for wheezing or shortness of breath.  . allopurinol (ZYLOPRIM) 300 MG tablet Take 300 mg by mouth daily.  Marland Kitchen atenolol (TENORMIN) 100 MG tablet Take 50 mg by mouth daily.  Marland Kitchen b complex vitamins tablet Take 1 tablet by mouth daily.  . blood glucose meter kit and supplies KIT Dispense based on patient and insurance preference. Use up to four times daily as directed. (FOR ICD-9 250.00, 250.01).  . budesonide-formoterol (SYMBICORT) 160-4.5 MCG/ACT inhaler Take 2 puffs first thing in am and then another 2 puffs about 12 hours later. (Patient taking differently: Inhale 2 puffs into the lungs 2 (two) times daily. )  . Insulin Isophane & Regular Human (NOVOLIN 70/30 FLEXPEN) (70-30) 100 UNIT/ML PEN Inject 15 Units into the skin 2 (two) times daily.  . Insulin Pen Needle (PEN NEEDLES 3/16") 31G X 5 MM MISC 1 Container by Does not apply route 2 (two) times daily.  Marland Kitchen levETIRAcetam (KEPPRA) 500 MG tablet Take 1 tablet (500 mg total) by mouth 2 (two) times daily.  . methylphenidate (RITALIN) 5  MG tablet Take 1 tablet (5 mg total) by mouth 2 (two) times daily.  . Multiple Vitamin (MULTIVITAMIN) tablet Take 1 tablet by mouth daily.  . ondansetron (ZOFRAN) 8 MG tablet Take 1 tablet (8 mg total) by mouth 2 (two) times daily as needed. Start on the third day after chemotherapy.  . temozolomide (TEMODAR) 180 MG capsule Take 1 capsules (159m) by mouth for 5 days on, 23 days off, repeated every 28 days. May take on empty stomach at bedtime to reduce N/V  . temozolomide (TEMODAR) 250 MG capsule Take 1 capsules (2568m by mouth for 5 days on, 23 days off, repeated every 28 days. May  take on empty stomach at bedtime to reduce N/V  . valsartan (DIOVAN) 320 MG tablet Take 1 tablet (320 mg total) by mouth daily.  . [DISCONTINUED] valsartan (DIOVAN) 160 MG tablet Take 160 mg by mouth daily.     Allergies:   Patient has no known allergies.   Social History   Socioeconomic History  . Marital status: Significant Other    Spouse name: Tim [name]  . Number of children: Not on file  . Years of education: 1645. Highest education level: Bachelor's degree (e.g., BA, AB, BS)  Occupational History  . Occupation: retired    EmFish farm manager[employer]    Comment: disability  Tobacco Use  . Smoking status: Former Smoker    Packs/day: 2.00    Years: 40.00    Pack years: 80.00    Types: Cigarettes    Quit date: 08/10/2011    Years since quitting: 8.0  . Smokeless tobacco: Never Used  Substance and Sexual Activity  . Alcohol use: Yes    Alcohol/week: 14.0 standard drinks    Types: 14 Cans of beer per week  . Drug use: Never  . Sexual activity: Not Currently  Other Topics Concern  . Not on file  Social History Narrative  . Not on file   Social Determinants of Health   Financial Resource Strain:   . Difficulty of Paying Living Expenses:   Food Insecurity:   . Worried About RuCharity fundraisern the Last Year:   . RaArboriculturistn the Last Year:   Transportation Needs:   . LaFilm/video editorMedical):   . Marland Kitchenack of Transportation (Non-Medical):   Physical Activity:   . Days of Exercise per Week:   . Minutes of Exercise per Session:   Stress:   . Feeling of Stress :   Social Connections:   . Frequency of Communication with Friends and Family:   . Frequency of Social Gatherings with Friends and Family:   . Attends Religious Services:   . Active Member of Clubs or Organizations:   . Attends ClArchivisteetings:   . Marland Kitchenarital Status:      Family History: The patient's family history includes Cancer in his brother.  ROS:   Please see the history of present  illness.     All other systems reviewed and are negative.  EKGs/Labs/Other Studies Reviewed:    The following studies were reviewed today:   EKG:  EKG is  ordered today.  The ekg ordered demonstrates 09/04/19 sinus bradycardia, rate 56, right bundle branch block, left anterior fascicular block  Recent Labs: 12/20/2018: ALT 59; BUN 13; Creatinine 0.97; Hemoglobin 15.6; Platelet Count 137; Potassium 4.2; Sodium 140  Recent Lipid Panel No results found for: CHOL, TRIG, HDL, CHOLHDL, VLDL, LDLCALC, LDLDIRECT  Physical Exam:    VS:  BP (!) 162/78   Pulse (!) 56   Ht '6\' 4"'  (1.93 m)   Wt 290 lb 3.2 oz (131.6 kg)   BMI 35.32 kg/m     Wt Readings from Last 3 Encounters:  09/04/19 290 lb 3.2 oz (131.6 kg)  07/25/19 289 lb 1.6 oz (131.1 kg)  03/28/19 283 lb 11.2 oz (128.7 kg)     GEN:  in no acute distress HEENT: Normal NECK: No JVD LYMPHATICS: No lymphadenopathy CARDIAC: RRR, no murmurs, rubs, gallops RESPIRATORY:  Clear to auscultation without rales, wheezing or rhonchi  ABDOMEN: Soft, non-tender, non-distended MUSCULOSKELETAL:  No edema; No deformity  SKIN: Warm and dry NEUROLOGIC:  Alert and oriented x 3 PSYCHIATRIC:  Normal affect   ASSESSMENT:    1. DOE (dyspnea on exertion)   2. Essential hypertension   3. Lipid screening   4. Hyperlipidemia, unspecified hyperlipidemia type    PLAN:     Dyspnea on exertion: Suspect due to COPD.  Will check TTE to rule out structural heart disease  Hypertension: On atenolol 50 mg daily and valsartan 160 mg daily.  BP elevated, will increase valsartan to 320 mg daily.  Check BMP in 1 week  Hyperlipidemia: Recommend statin given diabetes history.  Reports unable to tolerate statins in the past.  Will check lipid panel.  Will check calcium score for further risk stratification.  Type 2 diabetes: On insulin  RTC in 1 month  Medication Adjustments/Labs and Tests Ordered: Current medicines are reviewed at length with the patient  today.  Concerns regarding medicines are outlined above.  Orders Placed This Encounter  Procedures  . CT CARDIAC SCORING  . Basic metabolic panel  . Lipid panel  . EKG 12-Lead  . ECHOCARDIOGRAM COMPLETE   Meds ordered this encounter  Medications  . valsartan (DIOVAN) 320 MG tablet    Sig: Take 1 tablet (320 mg total) by mouth daily.    Dispense:  30 tablet    Refill:  3    Dose increase    Patient Instructions  Medication Instructions:  INCREASE valsartan to 320 mg daily  *If you need a refill on your cardiac medications before your next appointment, please call your pharmacy*   Lab Work: Please return for FASTING labs in 1 WEEK (BMET, Lipid)  Our in office lab hours are Monday-Friday 8:00-4:00, closed for lunch 12:45-1:45 pm.  No appointment needed.  If you have labs (blood work) drawn today and your tests are completely normal, you will receive your results only by: Marland Kitchen MyChart Message (if you have MyChart) OR . A paper copy in the mail If you have any lab test that is abnormal or we need to change your treatment, we will call you to review the results.   Testing/Procedures: Your physician has requested that you have an echocardiogram. Echocardiography is a painless test that uses sound waves to create images of your heart. It provides your doctor with information about the size and shape of your heart and how well your heart's chambers and valves are working. This procedure takes approximately one hour. There are no restrictions for this procedure. This will be done at our [facility] location:  [address]  CT coronary calcium score. This test is done at [address]. This is $150 out of pocket.   Coronary CalciumScan A coronary calcium scan is an imaging test used to look for deposits of calcium and other fatty materials (plaques) in the inner  lining of the blood vessels of the heart (coronary arteries). These deposits of calcium  and plaques can partly clog and narrow the coronary arteries without producing any symptoms or warning signs. This puts a person at risk for a heart attack. This test can detect these deposits before symptoms develop. Tell a health care provider about:  Any allergies you have.  All medicines you are taking, including vitamins, herbs, eye drops, creams, and over-the-counter medicines.  Any problems you or family members have had with anesthetic medicines.  Any blood disorders you have.  Any surgeries you have had.  Any medical conditions you have.  Whether you are pregnant or may be pregnant. What are the risks? Generally, this is a safe procedure. However, problems may occur, including:  Harm to a pregnant woman and her unborn baby. This test involves the use of radiation. Radiation exposure can be dangerous to a pregnant woman and her unborn baby. If you are pregnant, you generally should not have this procedure done.  Slight increase in the risk of cancer. This is because of the radiation involved in the test. What happens before the procedure? No preparation is needed for this procedure. What happens during the procedure?  You will undress and remove any jewelry around your neck or chest.  You will put on a hospital gown.  Sticky electrodes will be placed on your chest. The electrodes will be connected to an electrocardiogram (ECG) machine to record a tracing of the electrical activity of your heart.  A CT scanner will take pictures of your heart. During this time, you will be asked to lie still and hold your breath for 2-3 seconds while a picture of your heart is being taken. The procedure may vary among health care providers and hospitals. What happens after the procedure?  You can get dressed.  You can return to your normal activities.  It is up to you to get the results of your test. Ask your health care provider, or the department that is doing the test, when your  results will be ready. Summary  A coronary calcium scan is an imaging test used to look for deposits of calcium and other fatty materials (plaques) in the inner lining of the blood vessels of the heart (coronary arteries).  Generally, this is a safe procedure. Tell your health care provider if you are pregnant or may be pregnant.  No preparation is needed for this procedure.  A CT scanner will take pictures of your heart.  You can return to your normal activities after the scan is done. This information is not intended to replace advice given to you by your health care provider. Make sure you discuss any questions you have with your health care provider. Document Released: 10/24/2007 Document Revised: 03/16/2016 Document Reviewed: 03/16/2016 Elsevier Interactive Patient Education  2017 [place]: At [facility], you and your health needs are our priority.  As part of our continuing mission to provide you with exceptional heart care, we have created designated Provider Care Teams.  These Care Teams include your primary Cardiologist (physician) and Advanced Practice Providers (APPs -  Physician Assistants and Nurse Practitioners) who all work together to provide you with the care you need, when you need it.  We recommend signing up for the patient portal called "MyChart".  Sign up information is provided on this After Visit Summary.  MyChart is used to connect with patients for Virtual Visits (Telemedicine).  Patients are able to view  lab/test results, encounter notes, upcoming appointments, etc.  Non-urgent messages can be sent to your provider as well.   To learn more about what you can do with MyChart, go to [email or web address].    Your next appointment:   1 month(s)  The format for your next appointment:   In Person  Provider:   Oswaldo Milian, MD   Other Instructions Please monitor your blood pressure daily at home, write it down.  Call the office in  2 weeks to report the readings for Dr. Gardiner Rhyme to review.     Signed, Donato Heinz, MD  09/04/2019 6:35 PM    [place]

## 2019-09-04 ENCOUNTER — Encounter: Payer: Self-pay | Admitting: Cardiology

## 2019-09-04 ENCOUNTER — Other Ambulatory Visit: Payer: Self-pay

## 2019-09-04 ENCOUNTER — Ambulatory Visit: Payer: PPO | Admitting: Cardiology

## 2019-09-04 VITALS — BP 162/78 | HR 56 | Ht 76.0 in | Wt 290.2 lb

## 2019-09-04 DIAGNOSIS — I10 Essential (primary) hypertension: Secondary | ICD-10-CM | POA: Diagnosis not present

## 2019-09-04 DIAGNOSIS — Z13220 Encounter for screening for lipoid disorders: Secondary | ICD-10-CM | POA: Diagnosis not present

## 2019-09-04 DIAGNOSIS — R0600 Dyspnea, unspecified: Secondary | ICD-10-CM | POA: Diagnosis not present

## 2019-09-04 DIAGNOSIS — R0609 Other forms of dyspnea: Secondary | ICD-10-CM

## 2019-09-04 DIAGNOSIS — E785 Hyperlipidemia, unspecified: Secondary | ICD-10-CM

## 2019-09-04 MED ORDER — VALSARTAN 320 MG PO TABS: 320.0000 mg | ORAL_TABLET | Freq: Every day | ORAL | 3 refills | Status: DC

## 2019-09-04 NOTE — Patient Instructions (Signed)
Medication Instructions:  INCREASE valsartan to 320 mg daily  *If you need a refill on your cardiac medications before your next appointment, please call your pharmacy*   Lab Work: Please return for FASTING labs in 1 WEEK (BMET, Lipid)  Our in office lab hours are Monday-Friday 8:00-4:00, closed for lunch 12:45-1:45 pm.  No appointment needed.  If you have labs (blood work) drawn today and your tests are completely normal, you will receive your results only by: Marland Kitchen MyChart Message (if you have MyChart) OR . A paper copy in the mail If you have any lab test that is abnormal or we need to change your treatment, we will call you to review the results.   Testing/Procedures: Your physician has requested that you have an echocardiogram. Echocardiography is a painless test that uses sound waves to create images of your heart. It provides your doctor with information about the size and shape of your heart and how well your heart's chambers and valves are working. This procedure takes approximately one hour. There are no restrictions for this procedure. This will be done at our [facility] location:  [address]  CT coronary calcium score. This test is done at [address]. This is $150 out of pocket.   Coronary CalciumScan A coronary calcium scan is an imaging test used to look for deposits of calcium and other fatty materials (plaques) in the inner lining of the blood vessels of the heart (coronary arteries). These deposits of calcium and plaques can partly clog and narrow the coronary arteries without producing any symptoms or warning signs. This puts a person at risk for a heart attack. This test can detect these deposits before symptoms develop. Tell a health care provider about:  Any allergies you have.  All medicines you are taking, including vitamins, herbs, eye drops, creams, and over-the-counter medicines.  Any problems you or family members have  had with anesthetic medicines.  Any blood disorders you have.  Any surgeries you have had.  Any medical conditions you have.  Whether you are pregnant or may be pregnant. What are the risks? Generally, this is a safe procedure. However, problems may occur, including:  Harm to a pregnant woman and her unborn baby. This test involves the use of radiation. Radiation exposure can be dangerous to a pregnant woman and her unborn baby. If you are pregnant, you generally should not have this procedure done.  Slight increase in the risk of cancer. This is because of the radiation involved in the test. What happens before the procedure? No preparation is needed for this procedure. What happens during the procedure?  You will undress and remove any jewelry around your neck or chest.  You will put on a hospital gown.  Sticky electrodes will be placed on your chest. The electrodes will be connected to an electrocardiogram (ECG) machine to record a tracing of the electrical activity of your heart.  A CT scanner will take pictures of your heart. During this time, you will be asked to lie still and hold your breath for 2-3 seconds while a picture of your heart is being taken. The procedure may vary among health care providers and hospitals. What happens after the procedure?  You can get dressed.  You can return to your normal activities.  It is up to you to get the results of your test. Ask your health care provider, or the department that is doing the test, when your  results will be ready. Summary  A coronary calcium scan is an imaging test used to look for deposits of calcium and other fatty materials (plaques) in the inner lining of the blood vessels of the heart (coronary arteries).  Generally, this is a safe procedure. Tell your health care provider if you are pregnant or may be pregnant.  No preparation is needed for this procedure.  A CT scanner will take pictures of your  heart.  You can return to your normal activities after the scan is done. This information is not intended to replace advice given to you by your health care provider. Make sure you discuss any questions you have with your health care provider. Document Released: 10/24/2007 Document Revised: 03/16/2016 Document Reviewed: 03/16/2016 Elsevier Interactive Patient Education  2017 [place]: At [facility], you and your health needs are our priority.  As part of our continuing mission to provide you with exceptional heart care, we have created designated Provider Care Teams.  These Care Teams include your primary Cardiologist (physician) and Advanced Practice Providers (APPs -  Physician Assistants and Nurse Practitioners) who all work together to provide you with the care you need, when you need it.  We recommend signing up for the patient portal called "MyChart".  Sign up information is provided on this After Visit Summary.  MyChart is used to connect with patients for Virtual Visits (Telemedicine).  Patients are able to view lab/test results, encounter notes, upcoming appointments, etc.  Non-urgent messages can be sent to your provider as well.   To learn more about what you can do with MyChart, go to [email or web address].    Your next appointment:   1 month(s)  The format for your next appointment:   In Person  Provider:   Oswaldo Milian, MD   Other Instructions Please monitor your blood pressure daily at home, write it down.  Call the office in 2 weeks to report the readings for Dr. Gardiner Rhyme to review.

## 2019-09-06 DIAGNOSIS — J9811 Atelectasis: Secondary | ICD-10-CM | POA: Diagnosis not present

## 2019-09-06 DIAGNOSIS — R911 Solitary pulmonary nodule: Secondary | ICD-10-CM | POA: Diagnosis not present

## 2019-09-08 DIAGNOSIS — J449 Chronic obstructive pulmonary disease, unspecified: Secondary | ICD-10-CM | POA: Diagnosis not present

## 2019-09-08 DIAGNOSIS — J9611 Chronic respiratory failure with hypoxia: Secondary | ICD-10-CM | POA: Diagnosis not present

## 2019-09-08 DIAGNOSIS — J432 Centrilobular emphysema: Secondary | ICD-10-CM | POA: Diagnosis not present

## 2019-09-12 ENCOUNTER — Other Ambulatory Visit: Payer: Self-pay | Admitting: Radiation Therapy

## 2019-09-22 ENCOUNTER — Ambulatory Visit (HOSPITAL_COMMUNITY): Payer: PPO | Attending: Cardiovascular Disease

## 2019-09-22 ENCOUNTER — Ambulatory Visit (INDEPENDENT_AMBULATORY_CARE_PROVIDER_SITE_OTHER)
Admission: RE | Admit: 2019-09-22 | Discharge: 2019-09-22 | Disposition: A | Discharge status: Home / Self Care | Payer: Self-pay | Source: Ambulatory Visit | Attending: Cardiology | Admitting: Cardiology

## 2019-09-22 ENCOUNTER — Other Ambulatory Visit: Payer: Self-pay

## 2019-09-22 DIAGNOSIS — R0609 Other forms of dyspnea: Secondary | ICD-10-CM

## 2019-09-22 DIAGNOSIS — R0600 Dyspnea, unspecified: Secondary | ICD-10-CM

## 2019-09-22 MED ORDER — PERFLUTREN LIPID MICROSPHERE
1.0000 mL | INTRAVENOUS | Status: AC | PRN
  Administered 2019-09-22: 3 mL via INTRAVENOUS

## 2019-09-27 ENCOUNTER — Encounter: Payer: Self-pay | Admitting: Cardiovascular Disease

## 2019-09-27 ENCOUNTER — Telehealth: Payer: Self-pay | Admitting: Cardiology

## 2019-09-27 NOTE — Telephone Encounter (Signed)
error 

## 2019-09-27 NOTE — Telephone Encounter (Signed)
° °  Went to chart to check who called pt. Hayley called to let him know appt scheduled with Dr. Gardiner Rhyme on 10/05/19 at 1:20 pm to discuss echo results. Pt understood

## 2019-09-29 DIAGNOSIS — J9611 Chronic respiratory failure with hypoxia: Secondary | ICD-10-CM | POA: Diagnosis not present

## 2019-09-29 DIAGNOSIS — J449 Chronic obstructive pulmonary disease, unspecified: Secondary | ICD-10-CM | POA: Diagnosis not present

## 2019-10-02 DIAGNOSIS — Z13220 Encounter for screening for lipoid disorders: Secondary | ICD-10-CM | POA: Diagnosis not present

## 2019-10-02 DIAGNOSIS — I10 Essential (primary) hypertension: Secondary | ICD-10-CM | POA: Diagnosis not present

## 2019-10-02 LAB — BASIC METABOLIC PANEL
BUN/Creatinine Ratio: 14 (ref 10–24)
BUN: 12 mg/dL (ref 8–27)
CO2: 26 mmol/L (ref 20–29)
Calcium: 9.2 mg/dL (ref 8.6–10.2)
Chloride: 97 mmol/L (ref 96–106)
Creatinine, Ser: 0.83 mg/dL (ref 0.76–1.27)
GFR calc Af Amer: 108 mL/min/{1.73_m2} (ref >59)
GFR calc non Af Amer: 94 mL/min/{1.73_m2} (ref >59)
Glucose: 123 mg/dL — ABNORMAL HIGH (ref 65–99)
Potassium: 4.7 mmol/L (ref 3.5–5.2)
Sodium: 141 mmol/L (ref 134–144)

## 2019-10-02 LAB — LIPID PANEL
Chol/HDL Ratio: 5.1 ratio — ABNORMAL HIGH (ref 0.0–5.0)
Cholesterol, Total: 198 mg/dL (ref 100–199)
HDL: 39 mg/dL — ABNORMAL LOW (ref >39)
LDL Chol Calc (NIH): 98 mg/dL (ref 0–99)
Triglycerides: 364 mg/dL — ABNORMAL HIGH (ref 0–149)
VLDL Cholesterol Cal: 61 mg/dL — ABNORMAL HIGH (ref 5–40)

## 2019-10-03 DIAGNOSIS — J449 Chronic obstructive pulmonary disease, unspecified: Secondary | ICD-10-CM | POA: Diagnosis not present

## 2019-10-03 DIAGNOSIS — J9611 Chronic respiratory failure with hypoxia: Secondary | ICD-10-CM | POA: Diagnosis not present

## 2019-10-05 ENCOUNTER — Ambulatory Visit (INDEPENDENT_AMBULATORY_CARE_PROVIDER_SITE_OTHER): Payer: PPO | Admitting: Cardiology

## 2019-10-05 ENCOUNTER — Other Ambulatory Visit: Payer: Self-pay

## 2019-10-05 VITALS — BP 122/70 | HR 55 | Ht 76.0 in | Wt 287.6 lb

## 2019-10-05 DIAGNOSIS — R0600 Dyspnea, unspecified: Secondary | ICD-10-CM | POA: Diagnosis not present

## 2019-10-05 DIAGNOSIS — E785 Hyperlipidemia, unspecified: Secondary | ICD-10-CM | POA: Diagnosis not present

## 2019-10-05 DIAGNOSIS — R0609 Other forms of dyspnea: Secondary | ICD-10-CM

## 2019-10-05 DIAGNOSIS — I5040 Unspecified combined systolic (congestive) and diastolic (congestive) heart failure: Secondary | ICD-10-CM | POA: Diagnosis not present

## 2019-10-05 DIAGNOSIS — I10 Essential (primary) hypertension: Secondary | ICD-10-CM | POA: Diagnosis not present

## 2019-10-05 MED ORDER — METOPROLOL SUCCINATE ER 50 MG PO TB24
50.0000 mg | ORAL_TABLET | Freq: Every day | ORAL | 3 refills | Status: DC
Start: 2019-10-05 — End: 2020-08-19

## 2019-10-05 NOTE — Patient Instructions (Signed)
Medication Instructions:  STOP atenolol START metoprolol succinate (Toprol XL) 50 mg daily  *If you need a refill on your cardiac medications before your next appointment, please call your pharmacy*  Testing/Procedures: Your physician has requested that you have cardiac CT. Cardiac computed tomography (CT) is a painless test that uses an x-ray machine to take clear, detailed pictures of your heart. For further information please visit [email or web address]. Please follow instruction sheet as given.  Follow-Up: At [facility], you and your health needs are our priority.  As part of our continuing mission to provide you with exceptional heart care, we have created designated Provider Care Teams.  These Care Teams include your primary Cardiologist (physician) and Advanced Practice Providers (APPs -  Physician Assistants and Nurse Practitioners) who all work together to provide you with the care you need, when you need it.  We recommend signing up for the patient portal called "MyChart".  Sign up information is provided on this After Visit Summary.  MyChart is used to connect with patients for Virtual Visits (Telemedicine).  Patients are able to view lab/test results, encounter notes, upcoming appointments, etc.  Non-urgent messages can be sent to your provider as well.   To learn more about what you can do with MyChart, go to [email or web address].    Your next appointment:    pharmacist in 2 weeks Dr. Gardiner Rhyme in 2 months

## 2019-10-06 NOTE — Progress Notes (Addendum)
Cardiology Office Note:    Date:  10/06/2019   ID:  Tim [name], DOB 1956-04-30, MRN [id number]  PCP:  Bartholome Bill, MD  Cardiologist:  No primary care provider on file.  Electrophysiologist:  None   Referring MD: Bartholome Bill, MD   Chief Complaint  Patient presents with  . Shortness of Breath    History of Present Illness:    LUV [name] is a 64 y.o. 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  He was referred by Dr. Luciana Axe for evaluation of dyspnea on exertion, initially seen on 09/04/2019.  He reports that he gets short of breath with minimal exertion.  States that walking to the bathroom and back causes him to become short of breath.  He has to rest when walking at the grocery store.  Denies any chest pain.  Reports that he has tried statins on 2 different occasions but was unable to tolerate, though he does not remember the side effects that he had.  He does not exercise.  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  He smoked at least 2 packs/day x 40 years, but quit 8 years ago.  No family history of heart disease in his immediate family.  TTE 12/2017 showed normal systolic function, mild left atrial dilatation, mild MR. TTE on 09/22/2019 showed LVEF 40 to 45%, global hypokinesis, grade 1 diastolic dysfunction, normal RV function, no significant valvular disease.  Calcium score on 09/22/2019 was 8 (31st percentile).  Since last clinic visit, he reports that he has continued to have shortness of breath.  Denies any lightheadedness, syncope, palpitations, or chest pain.  Reports BP has been 120s to 130s over 70s to 80s.  Past Medical History:  Diagnosis Date  . Arthritis   . Brain tumor ([place])   . COPD (chronic obstructive pulmonary disease) ([place])   . Diabetes mellitus  without complication [facility])    patient states he was taken off meds at last appointment with PCP and is diet controlled  . Dyspnea   . Gout   . Hypertension 08/02/2017  . Pneumonia     Past Surgical History:  Procedure Laterality Date  . APPLICATION OF CRANIAL NAVIGATION Left 08/30/2017   Procedure: APPLICATION OF CRANIAL NAVIGATION;  Surgeon: Erline Levine, MD;  Location: [place];  Service: Neurosurgery;  Laterality: Left;  . IR IMAGING GUIDED PORT INSERTION  12/27/2017  . STERIOTACTIC STIMULATOR INSERTION Left 08/30/2017   Procedure: LEFT Frontal Sterotactic Brain Biopsy with BrainLab;  Surgeon: Erline Levine, MD;  Location: [place];  Service: Neurosurgery;  Laterality: Left;    Current Medications: Current Meds  Medication Sig  . Tiotropium Bromide-Olodaterol (STIOLTO RESPIMAT) 2.5-2.5 MCG/ACT AERS Inhale into the lungs.  . [DISCONTINUED] valsartan (DIOVAN) 320 MG tablet Take by mouth.     Allergies:   Patient has no known allergies.   Social History   Socioeconomic History  . Marital status: Significant Other    Spouse name: Tim [name]  . Number of children: Not on file  . Years of education: 74  . Highest education level: Bachelor's degree (e.g., BA, AB, BS)  Occupational History  . 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 Smoking status: Former Smoker    Packs/day: 2.00    Years: 40.00    Pack  years: 80.00    Types: Cigarettes    Quit date: 08/10/2011    Years since quitting: 8.1  . Smokeless tobacco: Never Used  Substance and Sexual Activity  . Alcohol use: Yes    Alcohol/week: 14.0 standard drinks    Types: 14 Cans of beer per week  . Drug use: Never  . Sexual activity: Not Currently  Other Topics Concern  . Not on file  Social History Narrative  . Not on file   Social Determinants of Health   Financial Resource Strain:   . Difficulty of Paying Living Expenses:   Food Insecurity:   . Worried About Charity fundraiser in the  Last Year:   . Arboriculturist in the Last Year:   Transportation Needs:   . Film/video editor (Medical):   Marland Kitchen Lack of Transportation (Non-Medical):   Physical Activity:   . Days of Exercise per Week:   . Minutes of Exercise per Session:   Stress:   . Feeling of Stress :   Social Connections:   . Frequency of Communication with Friends and Family:   . Frequency of Social Gatherings with Friends and Family:   . Attends Religious Services:   . Active Member of Clubs or Organizations:   . Attends Archivist Meetings:   Marland Kitchen Marital Status:      Family History: The patient's family history includes Cancer in his brother.  ROS:   Please see the history of present illness.     All other systems reviewed and are negative.  EKGs/Labs/Other Studies Reviewed:    The following studies were reviewed today:   EKG:  EKG is  ordered today.  The ekg ordered demonstrates 10/06/2019 sinus bradycardia, rate 55, right bundle branch block, left anterior fascicular block  Recent Labs: 12/20/2018: ALT 59; Hemoglobin 15.6; Platelet Count 137 10/02/2019: BUN 12; Creatinine, Ser 0.83; Potassium 4.7; Sodium 141  Recent Lipid Panel    Component Value Date/Time   CHOL 198 10/02/2019 1147   TRIG 364 (H) 10/02/2019 1147   HDL 39 (L) 10/02/2019 1147   CHOLHDL 5.1 (H) 10/02/2019 1147   LDLCALC 98 10/02/2019 1147    Physical Exam:    VS:  BP 122/70   Pulse (!) 55   Ht 6\' 4"  (1.93 m)   Wt 287 lb 9.6 oz (130.5 kg)   BMI 35.01 kg/m     Wt Readings from Last 3 Encounters:  10/05/19 287 lb 9.6 oz (130.5 kg)  09/04/19 290 lb 3.2 oz (131.6 kg)  07/25/19 289 lb 1.6 oz (131.1 kg)     GEN:  in no acute distress HEENT: Normal NECK: No JVD LYMPHATICS: No lymphadenopathy CARDIAC: RRR, no murmurs, rubs, gallops RESPIRATORY:  Clear to auscultation without rales, wheezing or rhonchi  ABDOMEN: Soft, non-tender, non-distended MUSCULOSKELETAL:  trace edema; No deformity  SKIN: Warm and  dry NEUROLOGIC:  Alert and oriented x 3 PSYCHIATRIC:  Normal affect   ASSESSMENT:    1. Combined systolic and diastolic congestive heart failure, unspecified HF chronicity ([place])   2. DOE (dyspnea on exertion)   3. Essential hypertension   4. Hyperlipidemia, unspecified hyperlipidemia type    PLAN:    Combined systolic and diastolic heart failure: New diagnosis.  EF 40 to 45% on TTE.  Does not appear volume overloaded on exam -Continue valsartan 320 mg daily -Will switch atenolol to Toprol-XL 50 mg daily -Coronary CTA to evaluate CAD as etiology of cardiomyopathy for CAD.  If  coronary CTA unremarkable, will plan cardiac MRI to evaluate further   Dyspnea on exertion: likely multifactorial with CAD and heart failure contributing  Hypertension: On atenolol 50 mg daily and valsartan 320 mg daily.  Will switch atenolol to toprol XL 50 mg daily as above  Hyperlipidemia: Recommend statin given diabetes history.  Reports unable to tolerate statins in the past.  LDL 98 on 10/02/19.  Will f/u coronary CTA as above for further risk stratification  Type 2 diabetes: On insulin  RTC in 2 months.  Will schedule pharmacy visit for 2 weeks for heart failure medication titration  Medication Adjustments/Labs and Tests Ordered: Current medicines are reviewed at length with the patient today.  Concerns regarding medicines are outlined above.  Orders Placed This Encounter  Procedures  . CT CORONARY MORPH W/CTA COR W/SCORE W/CA W/CM &/OR WO/CM  . CT CORONARY FRACTIONAL FLOW RESERVE DATA PREP  . CT CORONARY FRACTIONAL FLOW RESERVE FLUID ANALYSIS   Meds ordered this encounter  Medications  . metoprolol succinate (TOPROL XL) 50 MG 24 hr tablet    Sig: Take 1 tablet (50 mg total) by mouth daily.    Dispense:  90 tablet    Refill:  3    Patient Instructions  Medication Instructions:  STOP atenolol START metoprolol succinate (Toprol XL) 50 mg daily  *If you need a refill on your cardiac medications  before your next appointment, please call your pharmacy*  Testing/Procedures: Your physician has requested that you have cardiac CT. Cardiac computed tomography (CT) is a painless test that uses an x-ray machine to take clear, detailed pictures of your heart. For further information please visit [email or web address]. Please follow instruction sheet as given.  Follow-Up: At [facility], you and your health needs are our priority.  As part of our continuing mission to provide you with exceptional heart care, we have created designated Provider Care Teams.  These Care Teams include your primary Cardiologist (physician) and Advanced Practice Providers (APPs -  Physician Assistants and Nurse Practitioners) who all work together to provide you with the care you need, when you need it.  We recommend signing up for the patient portal called "MyChart".  Sign up information is provided on this After Visit Summary.  MyChart is used to connect with patients for Virtual Visits (Telemedicine).  Patients are able to view lab/test results, encounter notes, upcoming appointments, etc.  Non-urgent messages can be sent to your provider as well.   To learn more about what you can do with MyChart, go to [email or web address].    Your next appointment:    pharmacist in 2 weeks Dr. Gardiner Rhyme in 2 months       Signed, Donato Heinz, MD  10/06/2019 1:03 AM    [place]

## 2019-10-16 NOTE — Addendum Note (Signed)
Addended by: Hinton Dyer on: 10/16/2019 07:58 AM   Modules accepted: Orders

## 2019-10-18 NOTE — Progress Notes (Signed)
Patient ID: RONON FERGER                 DOB: 12-Sep-1955                      MRN: [id number]     HPI: RACHARD ISIDRO is a 64 y.o. male referred by Dr. Gardiner Rhyme to pharmacy clinic for HF medication management. 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 TTE 12/2017 showed normal systolic function, mild left atrial dilatation, mild MR. TTE on 09/22/2019 showed LVEF 40 to 45%, global hypokinesis, grade 1 diastolic dysfunction, normal RV function, no significant valvular disease. Calcium score on 09/22/2019 was 8 (31st percentile).  He was referred for evaluation of dyspnea on exertion, initially seen on 09/04/2019. He reported that he got short of breath with minimal exertion. Stated that walking to the bathroom and back caused him to become short of breath. He had to rest when walking at the grocery store. Denied chest pain. Reported that he has tried statins on 2 different occasions but was unable to tolerate, though he does not remember the side effects that he had. 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   At last visit with Dr. Gardiner Rhyme, he reported that he continued to have shortness of breath. Denied any lightheadedness, syncope, palpitations, or chest pain. Reported BP had been 120-130/70-80s.  Today he returns to pharmacy clinic for further medication titration. At last visit with MD, atenolol was switched to metoprolol succinate 67m daily. Symptomatically, he is feeling about the same as before. Denies dizziness, lightheadedness, and fatigue. No chest pain or palpitations. Feels SOB with minimal exertion (notes slight improvement over the past 2 weeks). Has to rest even at the grocery store when shopping. Able to complete all ADLs. Activity level minimal. He does not check his weight at home but his weight in clinic has  been stable. He notes some mild LEE recently at times but no LEE seen in clinic today. No PND, or orthopnea. Sleeps on 1 pillow. Uses oxygen at night (started ~ 10 years ago). Appetite has been stable. He adheres to a low-salt diet.  Current CHF meds Metoprolol succinate 50 mg daily Valsartan 320 mg daily  Family History: No family history of heart disease in his immediate family.  Social History: He smoked at least 2 packs/day x 40 years, but quit 8 years ago  Home BP readings: 120-130/70-80s; HR 60-70s (higher with metoprolol compared to atenolol)  Wt Readings from Last 3 Encounters:  10/05/19 287 lb 9.6 oz (130.5 kg)  09/04/19 290 lb 3.2 oz (131.6 kg)  07/25/19 289 lb 1.6 oz (131.1 kg)   BP Readings from Last 3 Encounters:  10/05/19 122/70  09/04/19 (!) 162/78  07/25/19 (!) 152/68   Pulse Readings from Last 3 Encounters:  10/05/19 (!) 55  09/04/19 (!) 56  07/25/19 60    Renal function: Estimated Creatinine Clearance: 134.4 mL/min (by C-G formula based on SCr of 0.83 mg/dL).  Past Medical History:  Diagnosis Date  . Arthritis   . [place]   . COPD (ch[place] obstructive pulmonary disease) ([place]   . Diabetes mellitus without complication ([facility]    patient states he was taken off meds at last appointment with PCP and is diet controlled  . Dyspnea   . Gout   . Hypertension 08/02/2017  .  Pneumonia     Current Outpatient Medications on File Prior to Visit  Medication Sig Dispense Refill  . albuterol (PROVENTIL HFA;VENTOLIN HFA) 108 (90 Base) MCG/ACT inhaler Inhale 2 puffs into the lungs every 4 (four) hours as needed for wheezing or shortness of breath. 1 Inhaler 0  . allopurinol (ZYLOPRIM) 300 MG tablet Take 300 mg by mouth daily.    Marland Kitchen b complex vitamins tablet Take 1 tablet by mouth daily.    . blood glucose meter kit and supplies KIT Dispense based on patient and insurance preference. Use up to four times daily as directed. (FOR ICD-9 250.00, 250.01). 1 each 0    . Insulin Isophane & Regular Human (NOVOLIN 70/30 FLEXPEN) (70-30) 100 UNIT/ML PEN Inject 15 Units into the skin 2 (two) times daily. 15 mL 11  . Insulin Pen Needle (PEN NEEDLES 3/16") 31G X 5 MM MISC 1 Container by Does not apply route 2 (two) times daily. 100 each 0  . levETIRAcetam (KEPPRA) 500 MG tablet Take 1 tablet (500 mg total) by mouth 2 (two) times daily. 60 tablet 5  . metoprolol succinate (TOPROL XL) 50 MG 24 hr tablet Take 1 tablet (50 mg total) by mouth daily. 90 tablet 3  . Multiple Vitamin (MULTIVITAMIN) tablet Take 1 tablet by mouth daily.    . Tiotropium Bromide-Olodaterol (STIOLTO RESPIMAT) 2.5-2.5 MCG/ACT AERS Inhale into the lungs.    . valsartan (DIOVAN) 320 MG tablet Take 1 tablet (320 mg total) by mouth daily. 30 tablet 3   Current Facility-Administered Medications on File Prior to Visit  Medication Dose Route Frequency Provider Last Rate Last Admin  . heparin lock flush 100 unit/mL  500 Units Intracatheter Once Ventura Sellers, MD      . sodium chloride flush (NS) 0.9 % injection 10 mL  10 mL Intracatheter Once Ventura Sellers, MD        No Known Allergies  Assessment/Plan:  1. CHF - Combined systolic and diastolic heart failure: New diagnosis. - LVEF 40 to 45% on TTE - NYHA Class III - Euvolemic on physical exam - Continue metoprolol succinate 50 mg daily - Continue valsartan 320 mg daily - Initiate spironolactone 12.5 mg daily. Check BMET in 10 days. Discussed low potassium diet - Discussed possibility of switching from valsartan to Entresto at next visit - Continue to titrate beta blocker as able  Kennon Holter, PharmD PGY1 Ambulatory Care Pharmacy Resident

## 2019-10-19 ENCOUNTER — Ambulatory Visit (INDEPENDENT_AMBULATORY_CARE_PROVIDER_SITE_OTHER): Payer: PPO | Admitting: Pharmacist

## 2019-10-19 ENCOUNTER — Other Ambulatory Visit: Payer: Self-pay

## 2019-10-19 VITALS — BP 128/76 | HR 64 | Wt 286.0 lb

## 2019-10-19 DIAGNOSIS — I5042 Chronic combined systolic (congestive) and diastolic (congestive) heart failure: Secondary | ICD-10-CM | POA: Insufficient documentation

## 2019-10-19 DIAGNOSIS — I5040 Unspecified combined systolic (congestive) and diastolic (congestive) heart failure: Secondary | ICD-10-CM | POA: Diagnosis not present

## 2019-10-19 MED ORDER — SPIRONOLACTONE 25 MG PO TABS
12.5000 mg | ORAL_TABLET | Freq: Every day | ORAL | 3 refills | Status: DC
Start: 2019-10-19 — End: 2020-08-19

## 2019-10-19 NOTE — Patient Instructions (Addendum)
It was a pleasure seeing you today!  Your ejection fraction is 40-45%. We want to get your ejection fraction >60% ideally.   MEDICATIONS: -We are changing your medications today -Start taking spironolactone 12.5 mg daily -Call if you have questions about your medications. -In the future, we may switch your valsartan to another medication called Entresto (valsartan/sacuabtril).   NEXT APPOINTMENT: Return to clinic in 10 days for lab visit to check your kidney and potassium level.  In general, to take care of your heart failure: -Limit your fluid intake to 2 Liters (half-gallon) per day.   -Limit your salt intake to ideally 2-3 grams (2000-3000 mg) per day. -Weigh yourself daily and record, and bring that "weight diary" to your next appointment.  (Weight gain of 2-3 pounds in 1 day typically means fluid weight.) -The medications for your heart are to help your heart and help you live longer.   -Please contact us before stopping any of your heart medications.  Call the clinic at [phone] with questions or to reschedule future appointments.

## 2019-10-23 ENCOUNTER — Telehealth (HOSPITAL_COMMUNITY): Payer: Self-pay | Admitting: *Deleted

## 2019-10-23 NOTE — Telephone Encounter (Signed)

## 2019-10-24 ENCOUNTER — Other Ambulatory Visit: Payer: Self-pay

## 2019-10-24 ENCOUNTER — Ambulatory Visit (HOSPITAL_COMMUNITY)
Admission: RE | Admit: 2019-10-24 | Discharge: 2019-10-24 | Disposition: A | Discharge status: Home / Self Care | Payer: PPO | Source: Ambulatory Visit | Attending: Cardiology | Admitting: Cardiology

## 2019-10-24 DIAGNOSIS — I5040 Unspecified combined systolic (congestive) and diastolic (congestive) heart failure: Secondary | ICD-10-CM | POA: Diagnosis not present

## 2019-10-24 MED ORDER — NITROGLYCERIN 0.4 MG SL SUBL
SUBLINGUAL_TABLET | SUBLINGUAL | Status: AC
  Filled 2019-10-24: qty 2

## 2019-10-24 MED ORDER — NITROGLYCERIN 0.4 MG SL SUBL
0.8000 mg | SUBLINGUAL_TABLET | Freq: Once | SUBLINGUAL | Status: AC
  Administered 2019-10-24: 0.8 mg via SUBLINGUAL

## 2019-10-24 MED ORDER — IOHEXOL 350 MG/ML SOLN
80.0000 mL | Freq: Once | INTRAVENOUS | Status: AC | PRN
  Administered 2019-10-24: 80 mL via INTRAVENOUS

## 2019-10-25 ENCOUNTER — Other Ambulatory Visit: Payer: Self-pay | Admitting: Internal Medicine

## 2019-10-27 ENCOUNTER — Ambulatory Visit
Admission: RE | Admit: 2019-10-27 | Discharge: 2019-10-27 | Disposition: A | Discharge status: Home / Self Care | Payer: PPO | Source: Ambulatory Visit | Attending: Internal Medicine | Admitting: Internal Medicine

## 2019-10-27 ENCOUNTER — Other Ambulatory Visit: Payer: Self-pay

## 2019-10-27 DIAGNOSIS — C8589 Other specified types of non-Hodgkin lymphoma, extranodal and solid organ sites: Secondary | ICD-10-CM | POA: Diagnosis not present

## 2019-10-27 MED ORDER — GADOBENATE DIMEGLUMINE 529 MG/ML IV SOLN
20.0000 mL | Freq: Once | INTRAVENOUS | Status: AC | PRN
  Administered 2019-10-27: 20 mL via INTRAVENOUS

## 2019-10-30 ENCOUNTER — Inpatient Hospital Stay: Payer: PPO | Attending: Internal Medicine

## 2019-10-30 ENCOUNTER — Other Ambulatory Visit: Payer: PPO | Admitting: *Deleted

## 2019-10-30 ENCOUNTER — Other Ambulatory Visit: Payer: Self-pay

## 2019-10-30 DIAGNOSIS — I5040 Unspecified combined systolic (congestive) and diastolic (congestive) heart failure: Secondary | ICD-10-CM

## 2019-10-30 DIAGNOSIS — J9611 Chronic respiratory failure with hypoxia: Secondary | ICD-10-CM | POA: Diagnosis not present

## 2019-10-30 DIAGNOSIS — C8589 Other specified types of non-Hodgkin lymphoma, extranodal and solid organ sites: Secondary | ICD-10-CM | POA: Insufficient documentation

## 2019-10-30 DIAGNOSIS — J449 Chronic obstructive pulmonary disease, unspecified: Secondary | ICD-10-CM | POA: Diagnosis not present

## 2019-10-30 LAB — BASIC METABOLIC PANEL
BUN/Creatinine Ratio: 17 (ref 10–24)
BUN: 15 mg/dL (ref 8–27)
CO2: 23 mmol/L (ref 20–29)
Calcium: 9.2 mg/dL (ref 8.6–10.2)
Chloride: 99 mmol/L (ref 96–106)
Creatinine, Ser: 0.9 mg/dL (ref 0.76–1.27)
GFR calc Af Amer: 105 mL/min/{1.73_m2} (ref >59)
GFR calc non Af Amer: 91 mL/min/{1.73_m2} (ref >59)
Glucose: 141 mg/dL — ABNORMAL HIGH (ref 65–99)
Potassium: 5.1 mmol/L (ref 3.5–5.2)
Sodium: 138 mmol/L (ref 134–144)

## 2019-10-31 ENCOUNTER — Telehealth: Payer: Self-pay | Admitting: Pharmacist

## 2019-10-31 ENCOUNTER — Other Ambulatory Visit: Payer: Self-pay

## 2019-10-31 ENCOUNTER — Inpatient Hospital Stay: Payer: PPO

## 2019-10-31 ENCOUNTER — Inpatient Hospital Stay (HOSPITAL_BASED_OUTPATIENT_CLINIC_OR_DEPARTMENT_OTHER): Payer: PPO | Admitting: Internal Medicine

## 2019-10-31 VITALS — BP 124/72 | HR 59 | Temp 97.3°F | Resp 18 | Ht 76.0 in | Wt 282.5 lb

## 2019-10-31 DIAGNOSIS — C8589 Other specified types of non-Hodgkin lymphoma, extranodal and solid organ sites: Secondary | ICD-10-CM

## 2019-10-31 DIAGNOSIS — Z95828 Presence of other vascular implants and grafts: Secondary | ICD-10-CM

## 2019-10-31 MED ORDER — SODIUM CHLORIDE 0.9% FLUSH
10.0000 mL | Freq: Once | INTRAVENOUS | Status: AC
  Administered 2019-10-31: 10 mL
  Filled 2019-10-31: qty 10

## 2019-10-31 MED ORDER — HEPARIN SOD (PORK) LOCK FLUSH 100 UNIT/ML IV SOLN
500.0000 [IU] | Freq: Once | INTRAVENOUS | Status: AC
  Administered 2019-10-31: 500 [IU]
  Filled 2019-10-31: qty 5

## 2019-10-31 NOTE — Telephone Encounter (Signed)
Reviewed BMP results with patient. K 5.1, scr stable. Continue spironolactone 12.5mg  daily and continue lower K diet. Follow up scheduled for 7/8 @ 3:30.

## 2019-10-31 NOTE — Patient Instructions (Signed)

## 2019-10-31 NOTE — Progress Notes (Signed)
[facility] [place], [place] [address] [phone]   Interval Evaluation  Date of Service: 10/31/19 Patient Name: Tim [name] Patient MRN: [id number] Patient DOB: Sep 22, 1955 Provider: Ventura Sellers, MD  Identifying Statement:  Tim [name] is a 64 y.o. 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 No new or progressive neurologic deficits.  Occassional word finding difficulty, struggles a little with very fine motor skills. Port still in place.    Medications: Current Outpatient Medications on File Prior to Visit  Medication Sig Dispense Refill  . albuterol (PROVENTIL HFA;VENTOLIN HFA) 108 (90 Base) MCG/ACT inhaler Inhale 2 puffs into the lungs every 4 (four) hours as needed for wheezing or shortness of breath. 1 Inhaler 0  . allopurinol (ZYLOPRIM) 300 MG tablet Take 300 mg by mouth daily.    Marland Kitchen b complex vitamins tablet Take 1 tablet by mouth daily.    . blood glucose meter kit and supplies KIT Dispense based on patient and insurance preference. Use up to four times daily as directed. (FOR ICD-9 250.00, 250.01). 1 each 0  . Insulin Isophane & Regular Human (NOVOLIN 70/30 FLEXPEN) (70-30) 100 UNIT/ML PEN Inject 15 Units into the skin 2 (two) times daily. 15 mL 11  . Insulin Pen Needle (PEN NEEDLES 3/16") 31G X 5 MM MISC 1 Container by Does not apply route 2 (two) times daily. 100 each 0  . levETIRAcetam (KEPPRA) 500 MG tablet TAKE 1 TABLET BY MOUTH TWICE A DAY 60 tablet 5  . metoprolol succinate (TOPROL XL) 50 MG 24 hr tablet Take 1 tablet (50 mg total) by mouth daily. 90 tablet 3  . Multiple Vitamin (MULTIVITAMIN) tablet Take 1 tablet by mouth daily.    Marland Kitchen spironolactone (ALDACTONE) 25 MG tablet Take 0.5 tablets (12.5 mg total) by mouth daily. 45 tablet 3  . Tiotropium Bromide-Olodaterol (STIOLTO RESPIMAT) 2.5-2.5 MCG/ACT AERS  Inhale into the lungs.    . valsartan (DIOVAN) 320 MG tablet Take 1 tablet (320 mg total) by mouth daily. 30 tablet 3   Current Facility-Administered Medications on File Prior to Visit  Medication Dose Route Frequency Provider Last Rate Last Admin  . heparin lock flush 100 unit/mL  500 Units Intracatheter Once Ventura Sellers, MD      . sodium chloride flush (NS) 0.9 % injection 10 mL  10 mL Intracatheter Once Tim [name], Tim Lav, MD        Allergies: No Known Allergies   Past Medical History:  Past Medical History:  Diagnosis Date  . Arthritis   . Brain tumor ([place])   . COPD (chronic obstructive pulmonary disease) ([place])   . Diabetes mellitus without complication [facility])    patient states he was taken off meds at last appointment with PCP and is diet controlled  . Dyspnea   . Gout   . Hypertension 08/02/2017  . Pneumonia    Past Surgical History: none    Social History:  Social History   Socioeconomic History  . Marital status: Significant Other    Spouse name: [name]  . Number of children: Not on file  . Years of education: 70  . Highest education level: Bachelor's degree (e.g., BA, AB, BS)  Occupational History  . 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 Smoking status: Former Smoker    Packs/day:  2.00    Years: 40.00    Pack years: 80.00    Types: Cigarettes    Quit date: 08/10/2011    Years since quitting: 8.2  . Smokeless tobacco: Never Used  Vaping Use  . Vaping Use: Former  . Quit date: 05/12/2011  Substance and Sexual Activity  . Alcohol use: Yes    Alcohol/week: 14.0 standard drinks    Types: 14 Cans of beer per week  . Drug use: Never  . Sexual activity: Not Currently  Other Topics Concern  . Not on file  Social History Narrative  . Not on file   Social Determinants of Health   Financial Resource Strain:   . Difficulty of Paying Living Expenses:   Food Insecurity:   . Worried About Charity fundraiser in the Last  Year:   . Arboriculturist in the Last Year:   Transportation Needs:   . Film/video editor (Medical):   Marland Kitchen Lack of Transportation (Non-Medical):   Physical Activity:   . Days of Exercise per Week:   . Minutes of Exercise per Session:   Stress:   . Feeling of Stress :   Social Connections:   . Frequency of Communication with Friends and Family:   . Frequency of Social Gatherings with Friends and Family:   . Attends Religious Services:   . Active Member of Clubs or Organizations:   . Attends Archivist Meetings:   Marland Kitchen Marital Status:   Intimate Partner Violence:   . Fear of Current or Ex-Partner:   . Emotionally Abused:   Marland Kitchen Physically Abused:   . Sexually Abused:    Family History:  Family History  Problem Relation Age of Onset  . Cancer Brother     Review of Systems: Constitutional: Denies fevers, chills or abnormal weight loss Eyes: Denies blurriness of vision Ears, nose, mouth, throat, and face: Denies mucositis or sore throat Respiratory: denies sob Cardiovascular: Denies palpitation, chest discomfort or lower extremity swelling Gastrointestinal:  Denies nausea, constipation, diarrhea GU: Denies dysuria or incontinence Skin: Denies abnormal skin rashes Neurological: Per HPI Musculoskeletal: Denies joint pain, back or neck discomfort. No decrease in ROM Behavioral/Psych: Denies anxiety, disturbance in thought content, and mood instability  Physical Exam: Vitals:   10/31/19 1031  BP: 124/72  Pulse: (!) 59  Resp: 18  Temp: (!) 97.3 F (36.3 C)  SpO2: 94%   KPS: 80. General: Alert, cooperative, pleasant, in no acute distress Head: Biopsy scar noted, dry and intact. EENT: No conjunctival injection or scleral icterus. Oral mucosa moist Lungs: Resp effort normal Cardiac: Regular rate and rhythm Abdomen: Soft, non-distended abdomen Skin: No rashes cyanosis or petechiae. Extremities: No clubbing or edema  Neurologic Exam: Mental Status: Awake, alert,  attentive to examiner. Oriented to self and environment. Language fluent with intact comprehension, repetition, reading. Cranial Nerves: Visual acuity is grossly normal. Visual fields are full. Extra-ocular movements intact. No ptosis. Face is symmetric, tongue midline. Motor: Tone and bulk are normal. Full power in arms and legs. Reflexes are symmetric, no pathologic reflexes present. Intact finger to nose bilaterally Sensory: Intact to light touch and temperature Gait: Normal, independent   Labs: I have reviewed the data as listed    Component Value Date/Time   NA 138 10/30/2019 1045   K 5.1 10/30/2019 1045   CL 99 10/30/2019 1045   CO2 23 10/30/2019 1045   GLUCOSE 141 (H) 10/30/2019 1045   GLUCOSE 191 (H) 12/20/2018 0929   BUN  15 10/30/2019 1045   CREATININE 0.90 10/30/2019 1045   CREATININE 0.97 12/20/2018 0929   CALCIUM 9.2 10/30/2019 1045   PROT 7.1 12/20/2018 0929   ALBUMIN 4.1 12/20/2018 0929   AST 50 (H) 12/20/2018 0929   ALT 59 (H) 12/20/2018 0929   ALKPHOS 94 12/20/2018 0929   BILITOT 0.5 12/20/2018 0929   GFRNONAA 91 10/30/2019 1045   GFRNONAA >60 12/20/2018 0929   GFRAA 105 10/30/2019 1045   GFRAA >60 12/20/2018 0929   Lab Results  Component Value Date   WBC 6.4 12/20/2018   NEUTROABS 3.9 12/20/2018   HGB 15.6 12/20/2018   HCT 47.3 12/20/2018   MCV 97.7 12/20/2018   PLT 137 (L) 12/20/2018    Imaging:  [place] Clinician Interpretation: I have personally reviewed the CNS images as listed.  My interpretation, in the context of the patient's clinical presentation, is stable disease  MR BRAIN W WO CONTRAST  Result Date: 10/27/2019 CLINICAL DATA:  Primary CNS lymphoma. EXAM: MRI HEAD WITHOUT AND WITH CONTRAST TECHNIQUE: Multiplanar, multiecho pulse sequences of the brain and surrounding structures were obtained without and with intravenous contrast. CONTRAST:  31m MULTIHANCE GADOBENATE DIMEGLUMINE 529 MG/ML IV SOLN COMPARISON:  MRI head 07/24/2019 FINDINGS: Brain:  Stable MRI without recurrent tumor. There are 2 biopsy sites in the left frontal lobe with hemosiderin and cystic change. No abnormal enhancement. No new enhancing lesions identified Generalized atrophy.  Negative for acute infarct. Vascular: Normal arterial flow voids. Skull and upper cervical spine: Left frontal craniotomy for biopsy. Otherwise normal bone marrow. Sinuses/Orbits: Mucosal edema paranasal sinuses.  Negative orbit Other: None IMPRESSION: Stable MRI.  No recurrent tumor. Electronically Signed   By: CFranchot GalloM.D.   On: 10/27/2019 15:50   CT CORONARY MORPH W/CTA COR W/SCORE W/CA W/CM &/OR WO/CM  Addendum Date: 10/24/2019   ADDENDUM REPORT: 10/24/2019 21:38 CLINICAL DATA:  642Hwith systolic heart failure EXAM: Cardiac/Coronary CTA TECHNIQUE: The patient was scanned on a [employer] FINDINGS: A 100 kV prospective scan was triggered in the descending thoracic aorta at 111 HU's. Axial non-contrast 3 mm slices were carried out through the heart. The data set was analyzed on a dedicated work station and scored using the [place] G[place] rotation speed was 250 msecs and collimation was .6 mm. 0.8 mg of sl NTG was given. The 3D data set was reconstructed in 5% intervals of the 35-75 % of the R-R cycle. Phases were analyzed on a dedicated work station using MPR, MIP and VRT modes. The patient received 80 cc of contrast. Coronary Arteries:  Normal coronary origin.  Right dominance. RCA is a large dominant artery that gives rise to PDA and PLA. There is calcified plaque in the mid RCA causing 0-24% stenosis. Left main is a large artery that gives rise to LAD, ramus, and LCX arteries. There is calcified plaque in the proximal left main LAD is a large vessel that has no plaque. LCX is a non-dominant artery.  There is no plaque. Large ramus with no plaque Other findings: Left Ventricle: Normal size Left Atrium: Normal size Pulmonary Veins: Normal configuration Right Ventricle: Mildly  dilated Right Atrium: Mild enlargement Thoracic aorta: Normal size Pulmonary Arteries: Normal size Systemic Veins: Normal drainage Pericardium: Normal thickness IMPRESSION: 1. Coronary calcium score of 9. This was 32nd percentile for age and sex matched control. 2. Normal coronary origin with right dominance. 3. 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  Minimal non-obstructive CAD (0-24%). Consider non-atherosclerotic causes of chest pain. Consider preventive therapy and risk factor modification. Electronically Signed   By: Oswaldo Milian MD   On: 10/24/2019 21:38   Result Date: 10/24/2019 EXAM: OVER-READ INTERPRETATION  CT CHEST The following report is an over-read performed by radiologist Dr. Vinnie Langton of [facility], [place] on 10/24/2019. This over-read does not include interpretation of cardiac or coronary anatomy or pathology. The coronary calcium score/coronary CTA interpretation by the cardiologist is attached. COMPARISON:  None. FINDINGS: Several areas of linear scarring are noted in the lung bases bilaterally, most severe in the right lower lobe. Within the visualized portions of the thorax there are no suspicious appearing pulmonary nodules or masses, there is no acute consolidative airspace disease, no pleural effusions, no pneumothorax and no lymphadenopathy. Visualized portions of the upper abdomen demonstrates mild diffuse low attenuation throughout the visualized hepatic parenchyma, indicative of hepatic steatosis. There are no aggressive appearing lytic or blastic lesions noted in the visualized portions of the skeleton. Multiple old healed left-sided posterior rib fractures. IMPRESSION: 1. Mild hepatic steatosis. Electronically Signed: By: Vinnie Langton M.D. On: 10/24/2019 14:02     Assessment/Plan 1. Primary CNS [facility]  Mr. Daniello is clinically and radiographically stable today.   We  recommended he remain on observation, returning in 4 months with MRI brain for review.  Will recommend referral back to IR for port removal.    Continue Keppra 246m BID.    We appreciate the opportunity to participate in the care of CMICKEL SCHREUR   The total time spent in the encounter was 30 minutes and more than 50% was on counseling and review of test results   ZVentura Sellers MD Medical Director of Neuro-Oncology [facility] [place] [place]06/22/21 10:41 AM

## 2019-11-01 ENCOUNTER — Telehealth: Payer: Self-pay | Admitting: Internal Medicine

## 2019-11-01 NOTE — Telephone Encounter (Signed)
Scheduled per 6/22 los. Unable to reach pt. Left voicemail with appt time and date.

## 2019-11-02 ENCOUNTER — Telehealth: Payer: Self-pay | Admitting: Cardiology

## 2019-11-02 DIAGNOSIS — I5042 Chronic combined systolic (congestive) and diastolic (congestive) heart failure: Secondary | ICD-10-CM

## 2019-11-02 NOTE — Telephone Encounter (Signed)
Follow Up:     Pt is returning  Your call from today, concerning his results.

## 2019-11-02 NOTE — Telephone Encounter (Signed)
Patient aware of coronary CTA results and verbalized understanding.   Order placed for cardiac MRI.  Aware this must be approved by insurance prior to scheduling.

## 2019-11-12 ENCOUNTER — Other Ambulatory Visit: Payer: Self-pay | Admitting: Radiology

## 2019-11-14 ENCOUNTER — Other Ambulatory Visit: Payer: Self-pay

## 2019-11-14 ENCOUNTER — Ambulatory Visit (HOSPITAL_COMMUNITY)
Admission: RE | Admit: 2019-11-14 | Discharge: 2019-11-14 | Disposition: A | Discharge status: Home / Self Care | Payer: PPO | Source: Ambulatory Visit | Attending: Internal Medicine | Admitting: Internal Medicine

## 2019-11-14 ENCOUNTER — Encounter (HOSPITAL_COMMUNITY): Payer: Self-pay

## 2019-11-14 DIAGNOSIS — J449 Chronic obstructive pulmonary disease, unspecified: Secondary | ICD-10-CM | POA: Diagnosis not present

## 2019-11-14 DIAGNOSIS — Z87891 Personal history of nicotine dependence: Secondary | ICD-10-CM | POA: Diagnosis not present

## 2019-11-14 DIAGNOSIS — Z8572 Personal history of non-Hodgkin lymphomas: Secondary | ICD-10-CM | POA: Diagnosis not present

## 2019-11-14 DIAGNOSIS — M109 Gout, unspecified: Secondary | ICD-10-CM | POA: Insufficient documentation

## 2019-11-14 DIAGNOSIS — Z794 Long term (current) use of insulin: Secondary | ICD-10-CM | POA: Diagnosis not present

## 2019-11-14 DIAGNOSIS — I10 Essential (primary) hypertension: Secondary | ICD-10-CM | POA: Diagnosis not present

## 2019-11-14 DIAGNOSIS — Z452 Encounter for adjustment and management of vascular access device: Secondary | ICD-10-CM | POA: Insufficient documentation

## 2019-11-14 DIAGNOSIS — C8589 Other specified types of non-Hodgkin lymphoma, extranodal and solid organ sites: Secondary | ICD-10-CM

## 2019-11-14 DIAGNOSIS — Z79899 Other long term (current) drug therapy: Secondary | ICD-10-CM | POA: Insufficient documentation

## 2019-11-14 DIAGNOSIS — E119 Type 2 diabetes mellitus without complications: Secondary | ICD-10-CM | POA: Diagnosis not present

## 2019-11-14 HISTORY — PX: IR REMOVAL TUN ACCESS W/ PORT W/O FL MOD SED: IMG2290

## 2019-11-14 LAB — CBC WITH DIFFERENTIAL/PLATELET
Abs Immature Granulocytes: 0.01 10*3/uL (ref 0.00–0.07)
Basophils Absolute: 0.1 10*3/uL (ref 0.0–0.1)
Basophils Relative: 1 %
Eosinophils Absolute: 0.2 10*3/uL (ref 0.0–0.5)
Eosinophils Relative: 3 %
HCT: 51.5 % (ref 39.0–52.0)
Hemoglobin: 16.9 g/dL (ref 13.0–17.0)
Immature Granulocytes: 0 %
Lymphocytes Relative: 28 %
Lymphs Abs: 1.7 10*3/uL (ref 0.7–4.0)
MCH: 32.3 pg (ref 26.0–34.0)
MCHC: 32.8 g/dL (ref 30.0–36.0)
MCV: 98.3 fL (ref 80.0–100.0)
Monocytes Absolute: 0.7 10*3/uL (ref 0.1–1.0)
Monocytes Relative: 11 %
Neutro Abs: 3.6 10*3/uL (ref 1.7–7.7)
Neutrophils Relative %: 57 %
Platelets: 139 10*3/uL — ABNORMAL LOW (ref 150–400)
RBC: 5.24 MIL/uL (ref 4.22–5.81)
RDW: 12.6 % (ref 11.5–15.5)
WBC: 6.2 10*3/uL (ref 4.0–10.5)
nRBC: 0 % (ref 0.0–0.2)

## 2019-11-14 LAB — PROTIME-INR
INR: 1 (ref 0.8–1.2)
Prothrombin Time: 12.4 seconds (ref 11.4–15.2)

## 2019-11-14 LAB — GLUCOSE, CAPILLARY
Glucose-Capillary: 192 mg/dL — ABNORMAL HIGH (ref 70–99)
Glucose-Capillary: 171 mg/dL — ABNORMAL HIGH (ref 70–99)

## 2019-11-14 MED ORDER — FENTANYL CITRATE (PF) 100 MCG/2ML IJ SOLN
INTRAMUSCULAR | Status: AC | PRN
  Administered 2019-11-14 (×2): 50 ug via INTRAVENOUS

## 2019-11-14 MED ORDER — CEFAZOLIN SODIUM-DEXTROSE 2-4 GM/100ML-% IV SOLN: 2.0000 g | INTRAVENOUS | Status: AC

## 2019-11-14 MED ORDER — FENTANYL CITRATE (PF) 100 MCG/2ML IJ SOLN
INTRAMUSCULAR | Status: AC
  Filled 2019-11-14: qty 2

## 2019-11-14 MED ORDER — SODIUM CHLORIDE 0.9 % IV SOLN: INTRAVENOUS | Status: DC

## 2019-11-14 MED ORDER — LIDOCAINE-EPINEPHRINE 1 %-1:100000 IJ SOLN
INTRAMUSCULAR | Status: AC
  Filled 2019-11-14: qty 1

## 2019-11-14 MED ORDER — MIDAZOLAM HCL 2 MG/2ML IJ SOLN
INTRAMUSCULAR | Status: AC | PRN
  Administered 2019-11-14: 2 mg via INTRAVENOUS

## 2019-11-14 MED ORDER — CEFAZOLIN SODIUM-DEXTROSE 2-4 GM/100ML-% IV SOLN
INTRAVENOUS | Status: AC
  Administered 2019-11-14: 2 g via INTRAVENOUS
  Filled 2019-11-14: qty 100

## 2019-11-14 MED ORDER — LIDOCAINE-EPINEPHRINE 1 %-1:100000 IJ SOLN
INTRAMUSCULAR | Status: AC | PRN
  Administered 2019-11-14: 10 mL via INTRADERMAL

## 2019-11-14 MED ORDER — MIDAZOLAM HCL 2 MG/2ML IJ SOLN
INTRAMUSCULAR | Status: AC
  Filled 2019-11-14: qty 4

## 2019-11-14 NOTE — Discharge Instructions (Addendum)
Urgent needs - IR on call MD [phone]  Wound - May remove dressing and shower in 24 to 48 hours.  Keep site clean and dry.  Replace with bandaid. Do not submerge in tub or water until site healing well.   Implanted Port Removal, Care After This sheet gives you information about how to care for yourself after your procedure. Your health care provider may also give you more specific instructions. If you have problems or questions, contact your health care provider. What can I expect after the procedure? After the procedure, it is common to have:  Soreness or pain near your incision.  Some swelling or bruising near your incision. Follow these instructions at home: Medicines  Take over-the-counter and prescription medicines only as told by your health care provider.  If you were prescribed an antibiotic medicine, take it as told by your health care provider. Do not stop taking the antibiotic even if you start to feel better. Bathing  Do not take baths, swim, or use a hot tub until your health care provider approves. Ask your health care provider if you can take showers. You may only be allowed to take sponge baths. Incision care  Follow instructions from your health care provider about how to take care of your incision. Make sure you: ? Wash your hands with soap and water before you change your bandage (dressing). If soap and water are not available, use hand sanitizer. ? Change your dressing as told by your health care provider. ? Keep your dressing dry. ? Leave stitches (sutures), skin glue, or adhesive strips in place. These skin closures may need to stay in place for 2 weeks or longer. If adhesive strip edges start to loosen and curl up, you may trim the loose edges. Do not remove adhesive strips completely unless your health care provider tells you to do that.  Check your incision area every day for signs of infection. Check for: ? More redness, swelling, or pain. ? More fluid or  blood. ? Warmth. ? Pus or a bad smell. Driving   Do not drive for 24 hours if you were given a medicine to help you relax (sedative) during your procedure.  If you did not receive a sedative, ask your health care provider when it is safe to drive. Activity  Return to your normal activities as told by your health care provider. Ask your health care provider what activities are safe for you.  Do not lift anything that is heavier than 10 lb (4.5 kg), or the limit that you are told, until your health care provider says that it is safe.  Do not do activities that involve lifting your arms over your head. General instructions  Do not use any products that contain nicotine or tobacco, such as cigarettes and e-cigarettes. These can delay healing. If you need help quitting, ask your health care provider.  Keep all follow-up visits as told by your health care provider. This is important. Contact a health care provider if:  You have more redness, swelling, or pain around your incision.  You have more fluid or blood coming from your incision.  Your incision feels warm to the touch.  You have pus or a bad smell coming from your incision.  You have pain that is not relieved by your pain medicine. Get help right away if you have:  A fever or chills.  Chest pain.  Difficulty breathing. Summary  After the procedure, it is common to have pain, soreness,   swelling, or bruising near your incision.  If you were prescribed an antibiotic medicine, take it as told by your health care provider. Do not stop taking the antibiotic even if you start to feel better.  Do not drive for 24 hours if you were given a sedative during your procedure.  Return to your normal activities as told by your health care provider. Ask your health care provider what activities are safe for you. This information is not intended to replace advice given to you by your health care provider. Make sure you discuss any  questions you have with your health care provider. Document Revised: 06/10/2017 Document Reviewed: 06/10/2017 Elsevier Patient Education  2020 Elsevier Inc.   Moderate Conscious Sedation, Adult, Care After These instructions provide you with information about caring for yourself after your procedure. Your health care provider may also give you more specific instructions. Your treatment has been planned according to current medical practices, but problems sometimes occur. Call your health care provider if you have any problems or questions after your procedure. What can I expect after the procedure? After your procedure, it is common:  To feel sleepy for several hours.  To feel clumsy and have poor balance for several hours.  To have poor judgment for several hours.  To vomit if you eat too soon. Follow these instructions at home: For at least 24 hours after the procedure:  Do not: ? Participate in activities where you could fall or become injured. ? Drive. ? Use heavy machinery. ? Drink alcohol. ? Take sleeping pills or medicines that cause drowsiness. ? Make important decisions or sign legal documents. ? Take care of children on your own.  Rest. Eating and drinking  Follow the diet recommended by your health care provider.  If you vomit: ? Drink water, juice, or soup when you can drink without vomiting. ? Make sure you have little or no nausea before eating solid foods. General instructions  Have a responsible adult stay with you until you are awake and alert.  Take over-the-counter and prescription medicines only as told by your health care provider.  If you smoke, do not smoke without supervision.  Keep all follow-up visits as told by your health care provider. This is important. Contact a health care provider if:  You keep feeling nauseous or you keep vomiting.  You feel light-headed.  You develop a rash.  You have a fever. Get help right away if:  You  have trouble breathing. This information is not intended to replace advice given to you by your health care provider. Make sure you discuss any questions you have with your health care provider. Document Revised: 04/09/2017 Document Reviewed: 08/17/2015 Elsevier Patient Education  2020 Elsevier Inc.   

## 2019-11-14 NOTE — H&P (Signed)
Chief Complaint: Patient was seen in consultation today for tunneled central venous catheter with port removal.  Referring Physician(s): Vaslow,Zachary K  Supervising Physician: Jacqulynn Cadet  Patient Status: [facility] - Out-pt  History of Present Illness: Tim [name] is a 64 y.o. 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 Tim [name] was original diagnosed with left frontal CNS lymphoma in 2019 and underwent port placement in IR on 12/27/2017 with Dr. Kathlene Cote. He has since completed systemic and was recently found to be clinically and radiographically stable per oncology, they have asked that we remove his port today.  Mr. Otting states he was recently diagnosed with COPD and has chronic shortness of breath with his oxygen being in the high 80s at times. He does not wear oxygen at home. He was also told he had "mild heart failure" but that his ejection fraction was ok. He does not have any complaints today and is ready to proceed as planned.  Past Medical History:  Diagnosis Date  . Arthritis   . Brain tumor ([place])   . COPD (chronic obstructive pulmonary disease) ([place])   . Diabetes mellitus without complication [facility])    patient states he was taken off meds at last appointment with PCP and is diet controlled  . Dyspnea   . Gout   . Hypertension 08/02/2017  . Pneumonia     Past Surgical History:  Procedure Laterality Date  . APPLICATION OF CRANIAL NAVIGATION Left 08/30/2017   Procedure: APPLICATION OF CRANIAL NAVIGATION;  Surgeon: Erline Levine, MD;  Location: [place];  Service: Neurosurgery;  Laterality: Left;  . IR IMAGING GUIDED PORT INSERTION  12/27/2017  . STERIOTACTIC STIMULATOR INSERTION Left 08/30/2017   Procedure: LEFT Frontal Sterotactic Brain Biopsy with BrainLab;  Surgeon: Erline Levine, MD;  Location: [place];  Service: Neurosurgery;   Laterality: Left;    Allergies: Patient has no known allergies.  Medications: Prior to Admission medications   Medication Sig Start Date End Date Taking? Authorizing Provider  albuterol (PROVENTIL HFA;VENTOLIN HFA) 108 (90 Base) MCG/ACT inhaler Inhale 2 puffs into the lungs every 4 (four) hours as needed for wheezing or shortness of breath. 12/13/17   Raiford Noble Latif, DO  allopurinol (ZYLOPRIM) 300 MG tablet Take 300 mg by mouth daily.    [provider]  b complex vitamins tablet Take 1 tablet by mouth daily.    [provider]  blood glucose meter kit and supplies KIT Dispense based on patient and insurance preference. Use up to four times daily as directed. (FOR ICD-9 250.00, 250.01). 12/13/17   Raiford Noble Latif, DO  Insulin Isophane & Regular Human (NOVOLIN 70/30 FLEXPEN) (70-30) 100 UNIT/ML PEN Inject 15 Units into the skin 2 (two) times daily. 01/07/18   Raiford Noble Latif, DO  Insulin Pen Needle (PEN NEEDLES 3/16") 31G X 5 MM MISC 1 Container by Does not apply route 2 (two) times daily. 01/07/18   Sheikh, [name], DO  levETIRAcetam (KEPPRA) 500 MG tablet TAKE 1 TABLET BY MOUTH TWICE A DAY 10/26/19   Vaslow, [name], MD  metoprolol succinate (TOPROL XL) 50 MG 24 hr tablet Take 1 tablet (50 mg total) by mouth daily. 10/05/19   Donato Heinz, MD  Multiple Vitamin (MULTIVITAMIN) tablet Take 1 tablet by mouth daily.    [provider]  spironolactone (ALDACTONE) 25 MG tablet Take 0.5 tablets (12.5  mg total) by mouth daily. 10/19/19   Donato Heinz, MD  Tiotropium Bromide-Olodaterol (STIOLTO RESPIMAT) 2.5-2.5 MCG/ACT AERS Inhale into the lungs. 09/08/19   [provider]  valsartan (DIOVAN) 320 MG tablet Take 1 tablet (320 mg total) by mouth daily. 09/04/19   Donato Heinz, MD     Family History  Problem Relation Age of Onset  . Cancer Brother     Social History   Socioeconomic History  . Marital status: Significant  Other    Spouse name: [name]  . Number of children: Not on file  . Years of education: 43  . Highest education level: Bachelor's degree (e.g., BA, AB, BS)  Occupational History  . 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 Smoking status: Former Smoker    Packs/day: 2.00    Years: 40.00    Pack years: 80.00    Types: Cigarettes    Quit date: 08/10/2011    Years since quitting: 8.2  . Smokeless tobacco: Never Used  Vaping Use  . Vaping Use: Former  . Quit date: 05/12/2011  Substance and Sexual Activity  . Alcohol use: Yes    Alcohol/week: 14.0 standard drinks    Types: 14 Cans of beer per week  . Drug use: Never  . Sexual activity: Not Currently  Other Topics Concern  . Not on file  Social History Narrative  . Not on file   Social Determinants of Health   Financial Resource Strain:   . Difficulty of Paying Living Expenses:   Food Insecurity:   . Worried About Charity fundraiser in the Last Year:   . Arboriculturist in the Last Year:   Transportation Needs:   . Film/video editor (Medical):   Marland Kitchen Lack of Transportation (Non-Medical):   Physical Activity:   . Days of Exercise per Week:   . Minutes of Exercise per Session:   Stress:   . Feeling of Stress :   Social Connections:   . Frequency of Communication with Friends and Family:   . Frequency of Social Gatherings with Friends and Family:   . Attends Religious Services:   . Active Member of Clubs or Organizations:   . Attends Archivist Meetings:   Marland Kitchen Marital Status:      Review of Systems: A 12 point ROS discussed and pertinent positives are indicated in the HPI above.  All other systems are negative.  Review of Systems  Constitutional: Negative for appetite change, chills and fever.  Respiratory: Positive for shortness of breath (chronic). Negative for cough.   Cardiovascular: Negative for chest pain.  Gastrointestinal: Negative for abdominal pain, blood in stool,  diarrhea, nausea and vomiting.  Genitourinary: Negative for hematuria.  Musculoskeletal: Negative for back pain.  Neurological: Negative for dizziness and headaches.    Vital Signs: BP (!) 174/91   Pulse 60   Temp 97.9 F (36.6 C) (Oral)   Resp 18   SpO2 92%   Physical Exam Vitals reviewed.  Constitutional:      General: He is not in acute distress.    Appearance: He is obese.  HENT:     Head: Normocephalic.     Mouth/Throat:     Mouth: Mucous membranes are moist.     Pharynx: Oropharynx is clear. No oropharyngeal exudate or posterior oropharyngeal erythema.  Cardiovascular:     Rate and Rhythm: Normal rate and regular rhythm.  Pulmonary:  Effort: Pulmonary effort is normal.     Breath sounds: Normal breath sounds.  Abdominal:     General: There is no distension.     Palpations: Abdomen is soft.     Tenderness: There is no abdominal tenderness.  Skin:    General: Skin is warm and dry.  Neurological:     Mental Status: He is alert and oriented to person, place, and time.  Psychiatric:        Mood and Affect: Mood normal.        Behavior: Behavior normal.        Thought Content: Thought content normal.        Judgment: Judgment normal.      MD Evaluation Airway: WNL Heart: WNL Abdomen: WNL Chest/ Lungs: WNL ASA  Classification: 3 Mallampati/Airway Score: Two   Imaging: MR BRAIN W WO CONTRAST  Result Date: 10/27/2019 CLINICAL DATA:  Primary CNS lymphoma. EXAM: MRI HEAD WITHOUT AND WITH CONTRAST TECHNIQUE: Multiplanar, multiecho pulse sequences of the brain and surrounding structures were obtained without and with intravenous contrast. CONTRAST:  16m MULTIHANCE GADOBENATE DIMEGLUMINE 529 MG/ML IV SOLN COMPARISON:  MRI head 07/24/2019 FINDINGS: Brain: Stable MRI without recurrent tumor. There are 2 biopsy sites in the left frontal lobe with hemosiderin and cystic change. No abnormal enhancement. No new enhancing lesions identified Generalized atrophy.   Negative for acute infarct. Vascular: Normal arterial flow voids. Skull and upper cervical spine: Left frontal craniotomy for biopsy. Otherwise normal bone marrow. Sinuses/Orbits: Mucosal edema paranasal sinuses.  Negative orbit Other: None IMPRESSION: Stable MRI.  No recurrent tumor. Electronically Signed   By: CFranchot GalloM.D.   On: 10/27/2019 15:50   CT CORONARY MORPH W/CTA COR W/SCORE W/CA W/CM &/OR WO/CM  Addendum Date: 10/24/2019   ADDENDUM REPORT: 10/24/2019 21:38 CLINICAL DATA:  626Jwith systolic heart failure EXAM: Cardiac/Coronary CTA TECHNIQUE: The patient was scanned on a [employer] FINDINGS: A 100 kV prospective scan was triggered in the descending thoracic aorta at 111 HU's. Axial non-contrast 3 mm slices were carried out through the heart. The data set was analyzed on a dedicated work station and scored using the AWaverly Gantry rotation speed was 250 msecs and collimation was .6 mm. 0.8 mg of sl NTG was given. The 3D data set was reconstructed in 5% intervals of the 35-75 % of the R-R cycle. Phases were analyzed on a dedicated work station using MPR, MIP and VRT modes. The patient received 80 cc of contrast. Coronary Arteries:  Normal coronary origin.  Right dominance. RCA is a large dominant artery that gives rise to PDA and PLA. There is calcified plaque in the mid RCA causing 0-24% stenosis. Left main is a large artery that gives rise to LAD, ramus, and LCX arteries. There is calcified plaque in the proximal left main LAD is a large vessel that has no plaque. LCX is a non-dominant artery.  There is no plaque. Large ramus with no plaque Other findings: Left Ventricle: Normal size Left Atrium: Normal size Pulmonary Veins: Normal configuration Right Ventricle: Mildly dilated Right Atrium: Mild enlargement Thoracic aorta: Normal size Pulmonary Arteries: Normal size Systemic Veins: Normal drainage Pericardium: Normal thickness IMPRESSION: 1. Coronary calcium score of 9. This  was 32nd percentile for age and sex matched control. 2. Normal coronary origin with right dominance. 3. 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 Minimal non-obstructive CAD (0-24%). Consider non-atherosclerotic causes of chest pain.  Consider preventive therapy and risk factor modification. Electronically Signed   By: Oswaldo Milian MD   On: 10/24/2019 21:38   Result Date: 10/24/2019 EXAM: OVER-READ INTERPRETATION  CT CHEST The following report is an over-read performed by radiologist Dr. Vinnie Langton of [facility], [place] on 10/24/2019. This over-read does not include interpretation of cardiac or coronary anatomy or pathology. The coronary calcium score/coronary CTA interpretation by the cardiologist is attached. COMPARISON:  None. FINDINGS: Several areas of linear scarring are noted in the lung bases bilaterally, most severe in the right lower lobe. Within the visualized portions of the thorax there are no suspicious appearing pulmonary nodules or masses, there is no acute consolidative airspace disease, no pleural effusions, no pneumothorax and no lymphadenopathy. Visualized portions of the upper abdomen demonstrates mild diffuse low attenuation throughout the visualized hepatic parenchyma, indicative of hepatic steatosis. There are no aggressive appearing lytic or blastic lesions noted in the visualized portions of the skeleton. Multiple old healed left-sided posterior rib fractures. IMPRESSION: 1. Mild hepatic steatosis. Electronically Signed: By: Vinnie Langton M.D. On: 10/24/2019 14:02    Labs:  CBC: Recent Labs    12/20/18 0929  WBC 6.4  HGB 15.6  HCT 47.3  PLT 137*    COAGS: No results for input(s): INR, APTT in the last 8760 hours.  BMP: Recent Labs    12/20/18 0929 10/02/19 1147 10/30/19 1045  NA 140 141 138  K 4.2 4.7 5.1  CL 101 97 99  CO2 '31 26 23  ' GLUCOSE 191* 123* 141*  BUN '13 12 15    ' CALCIUM 8.8* 9.2 9.2  CREATININE 0.97 0.83 0.90  GFRNONAA >60 94 91  GFRAA >60 108 105    LIVER FUNCTION TESTS: Recent Labs    12/20/18 0929  BILITOT 0.5  AST 50*  ALT 59*  ALKPHOS 94  PROT 7.1  ALBUMIN 4.1    TUMOR MARKERS: No results for input(s): AFPTM, CEA, CA199, CHROMGRNA in the last 8760 hours.  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 Port was originally placed 12/27/17 in IR.  Patient has been NPO since midnight except for a few sips of water with his AM medications. Afebrile, CBC pending, INR 1.0.  Risks and benefits of image-guided Port-a-catheter removal were discussed with the patient including, but not limited to bleeding, infection, pneumothorax and need for additional procedures.  All of the patient's questions were answered, patient is agreeable to proceed.  Consent signed and in chart.  Thank you for this interesting consult.  I greatly enjoyed meeting NAZAR KUAN and look forward to participating in their care.  A copy of this report was sent to the requesting provider on this date.  Electronically Signed: Joaquim Nam, PA-C 11/14/2019, 10:34 AM   I spent a total of25 Minutes in face to face in clinical consultation, greater than 50% of which was counseling/coordinating care for port removal.

## 2019-11-14 NOTE — Procedures (Signed)
Interventional Radiology Procedure Note  Procedure: Removal of right chest PowerPort  Complications: None  Estimated Blood Loss: None  Recommendations: - DC home   Signed,  Criselda Peaches, MD

## 2019-11-16 ENCOUNTER — Ambulatory Visit (INDEPENDENT_AMBULATORY_CARE_PROVIDER_SITE_OTHER): Payer: PPO | Admitting: Pharmacist

## 2019-11-16 ENCOUNTER — Other Ambulatory Visit: Payer: Self-pay

## 2019-11-16 VITALS — HR 61

## 2019-11-16 DIAGNOSIS — I5042 Chronic combined systolic (congestive) and diastolic (congestive) heart failure: Secondary | ICD-10-CM

## 2019-11-16 NOTE — Progress Notes (Signed)
Patient ID: Tim [name]                 DOB: 02/01/1956                      MRN: [id number]     HPI: Tim [name] is a 64 y.o. male referred by Dr. Gardiner Rhyme to pharmacy clinic for HF medication management. 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 TTE 12/2017 showed normal systolic function, mild left atrial dilatation, mild MR. TTE on 09/22/2019 showed LVEF 40 to 45%, global hypokinesis, grade 1 diastolic dysfunction, normal RV function, no significant valvular disease. Calcium score on 09/22/2019 was 8 (31st percentile).  He was referred for evaluation of dyspnea on exertion, initially seen on 09/04/2019. He reported that he got short of breath with minimal exertion. Stated that walking to the bathroom and back caused him to become short of breath. He had to rest when walking at the grocery store. Denied chest pain. Reported that he has tried statins on 2 different occasions but was unable to tolerate, though he does not remember the side effects that he had. 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   At last visit with CVRR clinic, spironolactone 12.8m was started. Repeat BMP showed as scr of 0.90 and K of 5.1. Today he returns to pharmacy clinic for further medication titration.  Symptomatically, he is feeling about the same as before. Denies dizziness, lightheadedness, and fatigue. No chest pain or palpitations. Feels SOB with minimal exertion (not any worse- has COPD). Able to complete all ADLs. Activity level minimal. He check his weight at home and states its been stable. No swelling. No PND, or orthopnea. Sleeps on 1 pillow. Uses oxygen at night (started ~ 10 years ago). Appetite has been stable. He adheres to a low-salt diet. Had his chemo port taken out 2 days ago.   Current CHF meds Metoprolol succinate 50 mg  daily Valsartan 320 mg daily Spironolactone 12.58mdaily  Family History: No family history of heart disease in his immediate family.  Social History: He smoked at least 2 packs/day x 40 years, but quit 8 years ago  Home BP readings: 120-130/80-85 HR mid 60's  Wt Readings from Last 3 Encounters:  10/31/19 282 lb 8 oz (128.1 kg)  10/19/19 286 lb (129.7 kg)  10/05/19 287 lb 9.6 oz (130.5 kg)   BP Readings from Last 3 Encounters:  11/14/19 138/75  10/31/19 124/72  10/24/19 132/62   Pulse Readings from Last 3 Encounters:  11/14/19 61  10/31/19 (!) 59  10/24/19 63    Renal function: CrCl cannot be calculated (Unknown ideal weight.).  Past Medical History:  Diagnosis Date  . Arthritis   . [place]  . [place] disease[place]  . [facility]   patient states he was taken off meds at last appointment with PCP and is diet controlled  . Dyspnea   . Ejection fraction < 50% 2021  . Gout   . Hypertension 08/02/2017  . Pneumonia     Current Outpatient Medications on File Prior to Visit  Medication Sig Dispense Refill  . albuterol (PROVENTIL HFA;VENTOLIN HFA) 108 (90 Base) MCG/ACT inhaler Inhale 2 puffs into the lungs every 4 (four) hours as needed for wheezing or shortness of breath. 1 Inhaler  0  . allopurinol (ZYLOPRIM) 300 MG tablet Take 300 mg by mouth daily.    Marland Kitchen b complex vitamins tablet Take 1 tablet by mouth daily.    . blood glucose meter kit and supplies KIT Dispense based on patient and insurance preference. Use up to four times daily as directed. (FOR ICD-9 250.00, 250.01). 1 each 0  . Insulin Isophane & Regular Human (NOVOLIN 70/30 FLEXPEN) (70-30) 100 UNIT/ML PEN Inject 15 Units into the skin 2 (two) times daily. 15 mL 11  . Insulin Pen Needle (PEN NEEDLES 3/16") 31G X 5 MM MISC 1 Container by Does not apply route 2 (two) times daily. 100 each 0  . levETIRAcetam (KEPPRA) 500 MG tablet TAKE 1 TABLET  BY MOUTH TWICE A DAY 60 tablet 5  . metoprolol succinate (TOPROL XL) 50 MG 24 hr tablet Take 1 tablet (50 mg total) by mouth daily. 90 tablet 3  . Multiple Vitamin (MULTIVITAMIN) tablet Take 1 tablet by mouth daily.    Marland Kitchen spironolactone (ALDACTONE) 25 MG tablet Take 0.5 tablets (12.5 mg total) by mouth daily. 45 tablet 3  . Tiotropium Bromide-Olodaterol (STIOLTO RESPIMAT) 2.5-2.5 MCG/ACT AERS Inhale into the lungs.    . valsartan (DIOVAN) 320 MG tablet Take 1 tablet (320 mg total) by mouth daily. 30 tablet 3   Current Facility-Administered Medications on File Prior to Visit  Medication Dose Route Frequency Provider Last Rate Last Admin  . heparin lock flush 100 unit/mL  500 Units Intracatheter Once Ventura Sellers, MD      . sodium chloride flush (NS) 0.9 % injection 10 mL  10 mL Intracatheter Once Ventura Sellers, MD        No Known Allergies  Assessment/Plan:  1. CHF-Blood pressure is above goal of <130/80. Discussed Entresto. He usually hits the coverage gap in Nov. Cost is a concern. We have filled out the [facility] patient assistance forms. Just waiting for patient to get gross income amount. Will submit once he calls with this information. If patient is approved for patient assistance than we will switch him from Valsartan to [facility]. Will plan to start with 49/20m BID and titrate up. This should help with his BP. I will check another BMP today since his K was borderline last time. HR is 60 today, which is about what it runs at home. A little hesitant to increase metoprolol. Will leave for now. Will schedule follow up once we know if [facility] be [facility]    MRamond Dial Pharm.D, BCPS, CPP [place] [id number] [place] [address] Phone: [phone] Fax: [phone]

## 2019-11-16 NOTE — Patient Instructions (Addendum)
It was nice to see you again!  Please find your gross annual income and give me a call.  I will then submit your application for patient assistance for Entresto. Once approved we will stop Valsartan and start Entresto.  Continue Metoprolol succinate 50 mg daily, Valsartan 320 mg daily and Spironolactone 12.5mg  daily for now.  Call us at [phone] with any questions or concerns.

## 2019-11-17 ENCOUNTER — Telehealth: Payer: Self-pay | Admitting: Pharmacist

## 2019-11-17 LAB — BASIC METABOLIC PANEL
BUN/Creatinine Ratio: 19 (ref 10–24)
BUN: 17 mg/dL (ref 8–27)
CO2: 28 mmol/L (ref 20–29)
Calcium: 9.4 mg/dL (ref 8.6–10.2)
Chloride: 100 mmol/L (ref 96–106)
Creatinine, Ser: 0.9 mg/dL (ref 0.76–1.27)
GFR calc Af Amer: 105 mL/min/{1.73_m2} (ref >59)
GFR calc non Af Amer: 91 mL/min/{1.73_m2} (ref >59)
Glucose: 148 mg/dL — ABNORMAL HIGH (ref 65–99)
Potassium: 5.2 mmol/L (ref 3.5–5.2)
Sodium: 141 mmol/L (ref 134–144)

## 2019-11-17 NOTE — Telephone Encounter (Signed)
Spoke with patient advised labs are stable. K at the high end of normal. Continue with low K diet. I will call him once I hear from [employer] patient assistance.

## 2019-11-21 ENCOUNTER — Encounter: Payer: Self-pay | Admitting: *Deleted

## 2019-11-23 ENCOUNTER — Encounter: Payer: Self-pay | Admitting: Cardiology

## 2019-11-23 ENCOUNTER — Telehealth: Payer: Self-pay | Admitting: Cardiology

## 2019-11-23 NOTE — Telephone Encounter (Signed)
Spoke with patient regarding appointment for Cardiac MRI scheduled Tuesday 12/26/19 at 12:00pm at [facility]---arrival time is 11:30 am 1st floor admissions office---we also rescheduled his follow up with Dr. Gardiner Rhyme to  Friday 12/29/19 at 2:00 pm.  Will mail information to patient and he voiced his understanding.

## 2019-11-27 ENCOUNTER — Telehealth: Payer: Self-pay | Admitting: Pharmacist

## 2019-11-27 NOTE — Telephone Encounter (Signed)
Received a phone call from [employer] patient assistance that patient needs a prior authorization approval or denial letter faxed over for [facility]. Prior authorization submitted to covermymeds. Awaiting determination. Will fax letter to [employer] at [phone] once received. Per pt assistance-if denied will need to start appeals.

## 2019-11-29 DIAGNOSIS — J9611 Chronic respiratory failure with hypoxia: Secondary | ICD-10-CM | POA: Diagnosis not present

## 2019-11-29 DIAGNOSIS — J449 Chronic obstructive pulmonary disease, unspecified: Secondary | ICD-10-CM | POA: Diagnosis not present

## 2019-11-30 NOTE — Addendum Note (Signed)
Addended by: Rollen Sox on: 11/30/2019 04:30 PM   Modules accepted: Orders

## 2019-11-30 NOTE — Telephone Encounter (Signed)
Prior authorization and patient assistance approved for patients Entresto. Called patient and LVM for his to call back

## 2019-11-30 NOTE — Telephone Encounter (Signed)
Patient called back.  Let him know patient assistance was approved and they would be sending him his meds.

## 2019-12-01 NOTE — Telephone Encounter (Signed)
Spoke with patient. Gave him the phone number for [employer] patient assistance foundation incase they do not call him in the next few days to set up shipment. Advised patient once he gets entresto to stop valsartan and start entresto. Start Entresto when he would be due for next valsartan dose.  Asked patient to call once he gets shipment so we can schedule follow up.

## 2019-12-05 ENCOUNTER — Ambulatory Visit: Payer: PPO | Admitting: Cardiology

## 2019-12-05 ENCOUNTER — Telehealth: Payer: Self-pay | Admitting: Internal Medicine

## 2019-12-05 NOTE — Telephone Encounter (Signed)
Pt is aware of appt rescheduled from 10/26 to 11/5 per 7/26 staff message.

## 2019-12-08 DIAGNOSIS — J9611 Chronic respiratory failure with hypoxia: Secondary | ICD-10-CM | POA: Diagnosis not present

## 2019-12-08 DIAGNOSIS — J432 Centrilobular emphysema: Secondary | ICD-10-CM | POA: Diagnosis not present

## 2019-12-08 DIAGNOSIS — J449 Chronic obstructive pulmonary disease, unspecified: Secondary | ICD-10-CM | POA: Diagnosis not present

## 2019-12-19 DIAGNOSIS — Z794 Long term (current) use of insulin: Secondary | ICD-10-CM | POA: Diagnosis not present

## 2019-12-19 DIAGNOSIS — E1142 Type 2 diabetes mellitus with diabetic polyneuropathy: Secondary | ICD-10-CM | POA: Diagnosis not present

## 2019-12-19 DIAGNOSIS — I10 Essential (primary) hypertension: Secondary | ICD-10-CM | POA: Diagnosis not present

## 2019-12-22 ENCOUNTER — Telehealth (HOSPITAL_COMMUNITY): Payer: Self-pay | Admitting: Emergency Medicine

## 2019-12-22 NOTE — Telephone Encounter (Signed)
Reaching out to patient to offer assistance regarding upcoming cardiac imaging study; pt verbalizes understanding of appt date/time, parking situation and where to check in,, and verified current allergies; name and call back number provided for further questions should they arise Marchia Bond RN Navigator Cardiac Imaging [facility] [phone] office [phone] cell   Denies implants, denies claustro Port-a-cath removed 1 month ago

## 2019-12-23 NOTE — Progress Notes (Signed)
Cardiology Office Note:    Date:  12/30/2019   ID:  Tim [name], DOB 06/18/1955, MRN [id number]  PCP:  Bartholome Bill, MD  Cardiologist:  No primary care provider on file.  Electrophysiologist:  None   Referring MD: Bartholome Bill, MD   Chief Complaint  Patient presents with  . Congestive Heart Failure    History of Present Illness:    Tim [name] is a 64 y.o. 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  He was referred by Dr. Luciana Axe for evaluation of dyspnea on exertion, initially seen on 09/04/2019.  He reports that he gets short of breath with minimal exertion.  States that walking to the bathroom and back causes him to become short of breath.  He has to rest when walking at the grocery store.  Denies any chest pain.  Reports that he has tried statins on 2 different occasions but was unable to tolerate, though he does not remember the side effects that he had.  He does not exercise.  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  He smoked at least 2 packs/day x 40 years, but quit 8 years ago.  No family history of heart disease in his immediate family.  TTE 12/2017 showed normal systolic function, mild left atrial dilatation, mild MR. TTE on 09/22/2019 showed LVEF 40 to 45%, global hypokinesis, grade 1 diastolic dysfunction, normal RV function, no significant valvular disease.  Calcium score on 09/22/2019 was 8 (31st percentile).  Coronary CTA on 10/27/2019 showed nonobstructive CAD with calcified plaque in left main and mid RCA causing minimal (0 to 24%) stenosis.  CMR 12/26/19 shows LVEF improved to nomral (56%), mild RV dysfunction (EF 45%), asymmetric LVH measuring up to 31m, basal inferolateral midwall LGE (could represent prior myocarditis, though Fabry's disease on differential given  LGE location with LVH and relatively low native T1 values).  Since last clinic visit, he reports he has been doing well.  Started Entresto about 2 weeks ago.  Reports has been checking BP, has been averaging 120/70s.  Denies any chest pain.  Does report dyspnea with minimal exertion.  Intermittent lightheadedness when he bends over, he denies any syncope.  No palpitations.     Past Medical History:  Diagnosis Date  . Arthritis   . [place] [place]   . COPD (chronic obstructive pulmonary disease) ([facility]   . Diabetes mellitus without complication ([facility]    patient states he was taken off meds at last appointment with PCP and is diet controlled  . Dyspnea   . Ejection fraction < 50% 2021  . Gout   . Hypertension 08/02/2017  . Pneumonia     Past Surgical History:  Procedure Laterality Date  . APPLICATION OF CRANIAL NAVIGATION Left 08/30/2017   Procedure: APPLICATION OF CRANIAL NAVIGATION;  Surgeon: SErline Levine MD;  [place]  Service: Neurosurgery;  Laterality: Left;  . IR IMAGING GUIDED PORT INSERTION  12/27/2017  . IR REMOVAL TUN ACCESS W/ PORT W/O FL MOD SED  11/14/2019  . STERIOTACTIC STIMULATOR INSERTION Left 08/30/2017   Procedure: LEFT Frontal Sterotactic Brain Biopsy with BrainLab;  Surgeon: SErline Levine MD;  [place]  Service: Neurosurgery;  Laterality: Left;    Current Medications: Current Meds  Medication Sig  . albuterol (PROVENTIL HFA;VENTOLIN HFA) 108 (90 Base) MCG/ACT inhaler Inhale  2 puffs into the lungs every 4 (four) hours as needed for wheezing or shortness of breath.  . allopurinol (ZYLOPRIM) 300 MG tablet Take 300 mg by mouth daily.  Marland Kitchen b complex vitamins tablet Take 1 tablet by mouth daily.  . blood glucose meter kit and supplies KIT Dispense based on patient and insurance preference. Use up to four times daily as directed. (FOR ICD-9 250.00, 250.01).  . Insulin Isophane & Regular Human (NOVOLIN 70/30 FLEXPEN) (70-30) 100 UNIT/ML PEN Inject 15 Units  into the skin 2 (two) times daily.  . Insulin Pen Needle (PEN NEEDLES 3/16") 31G X 5 MM MISC 1 Container by Does not apply route 2 (two) times daily.  Marland Kitchen levETIRAcetam (KEPPRA) 500 MG tablet TAKE 1 TABLET BY MOUTH TWICE A DAY  . metoprolol succinate (TOPROL XL) 50 MG 24 hr tablet Take 1 tablet (50 mg total) by mouth daily.  . Multiple Vitamin (MULTIVITAMIN) tablet Take 1 tablet by mouth daily.  Glory Rosebush VERIO test strip 1 each daily.  . sacubitril-valsartan (ENTRESTO) 49-51 MG Take 1 tablet by mouth 2 (two) times daily.  Marland Kitchen spironolactone (ALDACTONE) 25 MG tablet Take 0.5 tablets (12.5 mg total) by mouth daily.  . Tiotropium Bromide-Olodaterol (STIOLTO RESPIMAT) 2.5-2.5 MCG/ACT AERS Inhale into the lungs.     Allergies:   Patient has no known allergies.   Social History   Socioeconomic History  . Marital status: Significant Other    Spouse name: [name]  . Number of children: Not on file  . Years of education: 17  . Highest education level: Bachelor's degree (e.g., BA, AB, BS)  Occupational History  . 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 Smoking status: Former Smoker    Packs/day: 2.00    Years: 40.00    Pack years: 80.00    Types: Cigarettes    Quit date: 08/10/2011    Years since quitting: 8.3  . Smokeless tobacco: Never Used  Vaping Use  . Vaping Use: Former  . Quit date: 05/12/2011  Substance and Sexual Activity  . Alcohol use: Yes    Alcohol/week: 14.0 standard drinks    Types: 14 Cans of beer per week  . Drug use: Never  . Sexual activity: Not Currently  Other Topics Concern  . Not on file  Social History Narrative  . Not on file   Social Determinants of Health   Financial Resource Strain:   . Difficulty of Paying Living Expenses: Not on file  Food Insecurity:   . Worried About Charity fundraiser in the Last Year: Not on file  . Ran Out of Food in the Last Year: Not on file  Transportation Needs:   . Lack of  Transportation (Medical): Not on file  . Lack of Transportation (Non-Medical): Not on file  Physical Activity:   . Days of Exercise per Week: Not on file  . Minutes of Exercise per Session: Not on file  Stress:   . Feeling of Stress : Not on file  Social Connections:   . Frequency of Communication with Friends and Family: Not on file  . Frequency of Social Gatherings with Friends and Family: Not on file  . Attends Religious Services: Not on file  . Active Member of Clubs or Organizations: Not on file  . Attends Archivist Meetings: Not on file  . Marital Status: Not on file     Family History: The patient's family history includes  Cancer in his brother.  ROS:   Please see the history of present illness.     All other systems reviewed and are negative.  EKGs/Labs/Other Studies Reviewed:    The following studies were reviewed today:   EKG:  EKG is  ordered today.  The ekg ordered demonstrates sinus bradycardia, rate 58, right bundle branch block Recent Labs: 11/14/2019: Hemoglobin 16.9; Platelets 139 12/29/2019: ALT 49; BUN 20; Creatinine, Ser 0.85; Potassium 4.8; Sodium 142  Recent Lipid Panel    Component Value Date/Time   CHOL 198 10/02/2019 1147   TRIG 364 (H) 10/02/2019 1147   HDL 39 (L) 10/02/2019 1147   CHOLHDL 5.1 (H) 10/02/2019 1147   LDLCALC 98 10/02/2019 1147    Physical Exam:    VS:  BP 114/64   Pulse (!) 58   Ht _0  (1.93 m)   Wt 287 lb (130.2 kg)   SpO2 91%   BMI 34.93 kg/m     Wt Readings from Last 3 Encounters:  12/29/19 287 lb (130.2 kg)  10/31/19 282 lb 8 oz (128.1 kg)  10/19/19 286 lb (129.7 kg)     GEN:  in no acute distress HEENT: Normal NECK: No JVD LYMPHATICS: No lymphadenopathy CARDIAC: RRR, no murmurs, rubs, gallops RESPIRATORY:  Clear to auscultation without rales, wheezing or rhonchi  ABDOMEN: Soft, non-tender, non-distended MUSCULOSKELETAL:  trace edema; No deformity  SKIN: Warm and dry NEUROLOGIC:  Alert and  oriented x 3 PSYCHIATRIC:  Normal affect   ASSESSMENT:    1. Chronic combined systolic and diastolic heart failure ([facility])   2. LVH (left ventricular hypertrophy)   3. Essential hypertension   4. Hyperlipidemia, unspecified hyperlipidemia type    PLAN:    Chronic combined systolic and diastolic heart failure: EF 40 to 45% on TTE 09/22/19.  Does not appear volume overloaded on exam.  Nonischemic etiology, as coronary CTA on 10/27/2019 showed nonobstructive CAD with calcified plaque in left main and mid RCA causing minimal (0 to 24%) stenosis.  CMR 12/26/19 shows LVEF improved to nomral (56%), mild RV dysfunction (EF 45%), asymmetric LVH measuring up to 58m, basal inferolateral midwall LGE (could represent prior myocarditis, though Fabry's disease on differential given LGE location with LVH and relatively low native T1 values). -Continue Entresto 49-51 mg twice daily.  Will check CMET -Continue Toprol-XL 50 mg daily -Continue spironolactone 12.5 mg daily -Will check alpha galactosidase to evaluate for Fabry's disease.  If unremarkable, then suspect myocarditis as cause of patient's systolic dysfunction, which has now recovered  Hypertension: On Entresto, Toprol-XL, and spironolactone as above.  Appears controlled.  Hyperlipidemia: Recommend statin given diabetes history.  Reports unable to tolerate statins in the past, but does not remember what his reaction was.  LDL 98 on 10/02/19.  Recommend statin for goal LDL less than 70, will trial rosuvastatin 5 mg daily.  Type 2 diabetes: On insulin  RTC in 3 months  Medication Adjustments/Labs and Tests Ordered: Current medicines are reviewed at length with the patient today.  Concerns regarding medicines are outlined above.  Orders Placed This Encounter  Procedures  . Comprehensive metabolic panel  . Alpha galactosidase  . EKG 12-Lead   Meds ordered this encounter  Medications  . rosuvastatin (CRESTOR) 5 MG tablet    Sig: Take 1 tablet (5 mg  total) by mouth daily.    Dispense:  90 tablet    Refill:  3    Patient Instructions  Medication Instructions:  START rosuvastatin 5 mg daily  *  If you need a refill on your cardiac medications before your next appointment, please call your pharmacy*   Lab Work: CMET, Alpha galactosidase  If you have labs (blood work) drawn today and your tests are completely normal, you will receive your results only by: Marland Kitchen MyChart Message (if you have MyChart) OR . A paper copy in the mail If you have any lab test that is abnormal or we need to change your treatment, we will call you to review the results.  Follow-Up: At [facility], you and your health needs are our priority.  As part of our continuing mission to provide you with exceptional heart care, we have created designated Provider Care Teams.  These Care Teams include your primary Cardiologist (physician) and Advanced Practice Providers (APPs -  Physician Assistants and Nurse Practitioners) who all work together to provide you with the care you need, when you need it.  We recommend signing up for the patient portal called "MyChart".  Sign up information is provided on this After Visit Summary.  MyChart is used to connect with patients for Virtual Visits (Telemedicine).  Patients are able to view lab/test results, encounter notes, upcoming appointments, etc.  Non-urgent messages can be sent to your provider as well.   To learn more about what you can do with MyChart, go to [email or web address].    Your next appointment:   3 month(s)  The format for your next appointment:   In Person  Provider:   Oswaldo Milian, MD       Signed, Donato Heinz, MD  12/30/2019 3:49 PM    [place]

## 2019-12-26 ENCOUNTER — Ambulatory Visit (HOSPITAL_COMMUNITY)
Admission: RE | Admit: 2019-12-26 | Discharge: 2019-12-26 | Disposition: A | Discharge status: Home / Self Care | Payer: PPO | Source: Ambulatory Visit | Attending: Cardiology | Admitting: Cardiology

## 2019-12-26 DIAGNOSIS — R931 Abnormal findings on diagnostic imaging of heart and coronary circulation: Secondary | ICD-10-CM | POA: Diagnosis not present

## 2019-12-26 DIAGNOSIS — I5042 Chronic combined systolic (congestive) and diastolic (congestive) heart failure: Secondary | ICD-10-CM | POA: Insufficient documentation

## 2019-12-26 MED ORDER — GADOBUTROL 1 MMOL/ML IV SOLN
10.0000 mL | Freq: Once | INTRAVENOUS | Status: AC | PRN
  Administered 2019-12-26: 10 mL via INTRAVENOUS

## 2019-12-29 ENCOUNTER — Encounter: Payer: Self-pay | Admitting: Cardiology

## 2019-12-29 ENCOUNTER — Other Ambulatory Visit: Payer: Self-pay

## 2019-12-29 ENCOUNTER — Ambulatory Visit: Payer: PPO | Admitting: Cardiology

## 2019-12-29 VITALS — BP 114/64 | HR 58 | Ht 76.0 in | Wt 287.0 lb

## 2019-12-29 DIAGNOSIS — I517 Cardiomegaly: Secondary | ICD-10-CM | POA: Diagnosis not present

## 2019-12-29 DIAGNOSIS — I10 Essential (primary) hypertension: Secondary | ICD-10-CM | POA: Diagnosis not present

## 2019-12-29 DIAGNOSIS — E785 Hyperlipidemia, unspecified: Secondary | ICD-10-CM | POA: Diagnosis not present

## 2019-12-29 DIAGNOSIS — I5042 Chronic combined systolic (congestive) and diastolic (congestive) heart failure: Secondary | ICD-10-CM | POA: Diagnosis not present

## 2019-12-29 MED ORDER — ROSUVASTATIN CALCIUM 5 MG PO TABS
5.0000 mg | ORAL_TABLET | Freq: Every day | ORAL | 3 refills | Status: DC
Start: 2019-12-29 — End: 2020-09-26

## 2019-12-29 NOTE — Patient Instructions (Signed)
Medication Instructions:  START rosuvastatin 5 mg daily  *If you need a refill on your cardiac medications before your next appointment, please call your pharmacy*   Lab Work: CMET, Alpha galactosidase  If you have labs (blood work) drawn today and your tests are completely normal, you will receive your results only by: Marland Kitchen MyChart Message (if you have MyChart) OR . A paper copy in the mail If you have any lab test that is abnormal or we need to change your treatment, we will call you to review the results.  Follow-Up: At [facility], you and your health needs are our priority.  As part of our continuing mission to provide you with exceptional heart care, we have created designated Provider Care Teams.  These Care Teams include your primary Cardiologist (physician) and Advanced Practice Providers (APPs -  Physician Assistants and Nurse Practitioners) who all work together to provide you with the care you need, when you need it.  We recommend signing up for the patient portal called "MyChart".  Sign up information is provided on this After Visit Summary.  MyChart is used to connect with patients for Virtual Visits (Telemedicine).  Patients are able to view lab/test results, encounter notes, upcoming appointments, etc.  Non-urgent messages can be sent to your provider as well.   To learn more about what you can do with MyChart, go to [email or web address].    Your next appointment:   3 month(s)  The format for your next appointment:   In Person  Provider:   Oswaldo Milian, MD

## 2019-12-30 DIAGNOSIS — J449 Chronic obstructive pulmonary disease, unspecified: Secondary | ICD-10-CM | POA: Diagnosis not present

## 2019-12-30 DIAGNOSIS — J9611 Chronic respiratory failure with hypoxia: Secondary | ICD-10-CM | POA: Diagnosis not present

## 2019-12-30 LAB — COMPREHENSIVE METABOLIC PANEL
ALT: 49 IU/L — ABNORMAL HIGH (ref 0–44)
AST: 44 IU/L — ABNORMAL HIGH (ref 0–40)
Albumin/Globulin Ratio: 1.7 (ref 1.2–2.2)
Albumin: 4.3 g/dL (ref 3.8–4.8)
Alkaline Phosphatase: 97 IU/L (ref 48–121)
BUN/Creatinine Ratio: 24 (ref 10–24)
BUN: 20 mg/dL (ref 8–27)
Bilirubin Total: 0.7 mg/dL (ref 0.0–1.2)
CO2: 30 mmol/L — ABNORMAL HIGH (ref 20–29)
Calcium: 9.5 mg/dL (ref 8.6–10.2)
Chloride: 101 mmol/L (ref 96–106)
Creatinine, Ser: 0.85 mg/dL (ref 0.76–1.27)
GFR calc Af Amer: 107 mL/min/{1.73_m2} (ref >59)
GFR calc non Af Amer: 93 mL/min/{1.73_m2} (ref >59)
Globulin, Total: 2.6 g/dL (ref 1.5–4.5)
Glucose: 123 mg/dL — ABNORMAL HIGH (ref 65–99)
Potassium: 4.8 mmol/L (ref 3.5–5.2)
Sodium: 142 mmol/L (ref 134–144)
Total Protein: 6.9 g/dL (ref 6.0–8.5)

## 2020-01-02 ENCOUNTER — Telehealth: Payer: Self-pay | Admitting: Pharmacist

## 2020-01-02 NOTE — Telephone Encounter (Signed)
Spoke with patient- BP is doing well. 110's-120's. EF has recovered. Advised patient no need to see me in the clinic. Follow up as needed. He is to see Dr. Gardiner Rhyme in 3 months.

## 2020-01-29 ENCOUNTER — Other Ambulatory Visit: Payer: Self-pay | Admitting: Radiation Therapy

## 2020-01-30 DIAGNOSIS — J9611 Chronic respiratory failure with hypoxia: Secondary | ICD-10-CM | POA: Diagnosis not present

## 2020-01-30 DIAGNOSIS — J449 Chronic obstructive pulmonary disease, unspecified: Secondary | ICD-10-CM | POA: Diagnosis not present

## 2020-02-29 DIAGNOSIS — J449 Chronic obstructive pulmonary disease, unspecified: Secondary | ICD-10-CM | POA: Diagnosis not present

## 2020-02-29 DIAGNOSIS — J9611 Chronic respiratory failure with hypoxia: Secondary | ICD-10-CM | POA: Diagnosis not present

## 2020-03-05 ENCOUNTER — Ambulatory Visit: Payer: PPO | Admitting: Internal Medicine

## 2020-03-12 ENCOUNTER — Ambulatory Visit: Payer: PPO | Admitting: Internal Medicine

## 2020-03-13 ENCOUNTER — Ambulatory Visit
Admission: RE | Admit: 2020-03-13 | Discharge: 2020-03-13 | Disposition: A | Discharge status: Home / Self Care | Payer: PPO | Source: Ambulatory Visit | Attending: Internal Medicine | Admitting: Internal Medicine

## 2020-03-13 ENCOUNTER — Other Ambulatory Visit: Payer: Self-pay

## 2020-03-13 DIAGNOSIS — C8589 Other specified types of non-Hodgkin lymphoma, extranodal and solid organ sites: Secondary | ICD-10-CM

## 2020-03-13 MED ORDER — GADOBENATE DIMEGLUMINE 529 MG/ML IV SOLN
20.0000 mL | Freq: Once | INTRAVENOUS | Status: AC | PRN
  Administered 2020-03-13: 20 mL via INTRAVENOUS

## 2020-03-15 ENCOUNTER — Inpatient Hospital Stay: Payer: PPO | Attending: Internal Medicine | Admitting: Internal Medicine

## 2020-03-15 ENCOUNTER — Other Ambulatory Visit: Payer: Self-pay

## 2020-03-15 VITALS — BP 144/74 | HR 62 | Temp 97.9°F | Resp 18 | Ht 76.0 in | Wt 286.4 lb

## 2020-03-15 DIAGNOSIS — C8589 Other specified types of non-Hodgkin lymphoma, extranodal and solid organ sites: Secondary | ICD-10-CM

## 2020-03-15 NOTE — Progress Notes (Signed)
[facility] at [place], [place] [address] [phone]   Interval Evaluation  Date of Service: 03/15/20 Patient Name: Tim [name] Patient MRN: [id number] Patient DOB: 16-Oct-1955 Provider: Ventura Sellers, MD  Identifying Statement:  Tim [name] is a 64 y.o. 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 Vertell Limber 01/31/18: Completes 3rd cycle of HD-MTX and Rituximab  Interval History:  Tim [name] presents today for follow up after recent MRI brain. Denies new or progressive neurologic deficits.  Port has been removed.  Recently returned from cross country RV trip.  Medications: Current Outpatient Medications on File Prior to Visit  Medication Sig Dispense Refill  . albuterol (PROVENTIL HFA;VENTOLIN HFA) 108 (90 Base) MCG/ACT inhaler Inhale 2 puffs into the lungs every 4 (four) hours as needed for wheezing or shortness of breath. 1 Inhaler 0  . allopurinol (ZYLOPRIM) 300 MG tablet Take 300 mg by mouth daily.    Marland Kitchen b complex vitamins tablet Take 1 tablet by mouth daily.    . blood glucose meter kit and supplies KIT Dispense based on patient and insurance preference. Use up to four times daily as directed. (FOR ICD-9 250.00, 250.01). 1 each 0  . Insulin Isophane & Regular Human (NOVOLIN 70/30 FLEXPEN) (70-30) 100 UNIT/ML PEN Inject 15 Units into the skin 2 (two) times daily. 15 mL 11  . Insulin Pen Needle (PEN NEEDLES 3/16") 31G X 5 MM MISC 1 Container by Does not apply route 2 (two) times daily. 100 each 0  . levETIRAcetam (KEPPRA) 500 MG tablet TAKE 1 TABLET BY MOUTH TWICE A DAY 60 tablet 5  . metoprolol succinate (TOPROL XL) 50 MG 24 hr tablet Take 1 tablet (50 mg total) by mouth daily. 90 tablet 3  . Multiple Vitamin (MULTIVITAMIN) tablet Take 1 tablet by mouth daily.    Glory Rosebush VERIO test strip 1 each daily.    . rosuvastatin (CRESTOR) 5 MG tablet  Take 1 tablet (5 mg total) by mouth daily. 90 tablet 3  . sacubitril-valsartan (ENTRESTO) 49-51 MG Take 1 tablet by mouth 2 (two) times daily.    Marland Kitchen spironolactone (ALDACTONE) 25 MG tablet Take 0.5 tablets (12.5 mg total) by mouth daily. 45 tablet 3  . Tiotropium Bromide-Olodaterol (STIOLTO RESPIMAT) 2.5-2.5 MCG/ACT AERS Inhale into the lungs.     Current Facility-Administered Medications on File Prior to Visit  Medication Dose Route Frequency Provider Last Rate Last Admin  . heparin lock flush 100 unit/mL  500 Units Intracatheter Once Ventura Sellers, MD      . sodium chloride flush (NS) 0.9 % injection 10 mL  10 mL Intracatheter Once [name], [name], MD        Allergies: No Known Allergies   Past Medical History:  Past Medical History:  Diagnosis Date  . Arthritis   . Brain tumor ([place])   . COPD (chronic obstructive pulmonary disease) ([place])   . Diabetes mellitus without complication [facility])    patient states he was taken off meds at last appointment with PCP and is diet controlled  . Dyspnea   . Ejection fraction < 50% 2021  . Gout   . Hypertension 08/02/2017  . Pneumonia    Past Surgical History: none    Social History:  Social History   Socioeconomic History  . Marital status: Significant Other    Spouse name: [name]  . Number of children: Not on  file  . Years of education: 3  . Highest education level: Bachelor's degree (e.g., BA, AB, BS)  Occupational History  . 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 Smoking status: Former Smoker    Packs/day: 2.00    Years: 40.00    Pack years: 80.00    Types: Cigarettes    Quit date: 08/10/2011    Years since quitting: 8.6  . Smokeless tobacco: Never Used  Vaping Use  . Vaping Use: Former  . Quit date: 05/12/2011  Substance and Sexual Activity  . Alcohol use: Yes    Alcohol/week: 14.0 standard drinks    Types: 14 Cans of beer per week  . Drug use: Never  . Sexual activity: Not  Currently  Other Topics Concern  . Not on file  Social History Narrative  . Not on file   Social Determinants of Health   Financial Resource Strain:   . Difficulty of Paying Living Expenses: Not on file  Food Insecurity:   . Worried About Charity fundraiser in the Last Year: Not on file  . Ran Out of Food in the Last Year: Not on file  Transportation Needs:   . Lack of Transportation (Medical): Not on file  . Lack of Transportation (Non-Medical): Not on file  Physical Activity:   . Days of Exercise per Week: Not on file  . Minutes of Exercise per Session: Not on file  Stress:   . Feeling of Stress : Not on file  Social Connections:   . Frequency of Communication with Friends and Family: Not on file  . Frequency of Social Gatherings with Friends and Family: Not on file  . Attends Religious Services: Not on file  . Active Member of Clubs or Organizations: Not on file  . Attends Archivist Meetings: Not on file  . Marital Status: Not on file  Intimate Partner Violence:   . Fear of Current or Ex-Partner: Not on file  . Emotionally Abused: Not on file  . Physically Abused: Not on file  . Sexually Abused: Not on file   Family History:  Family History  Problem Relation Age of Onset  . Cancer Brother     Review of Systems: Constitutional: Denies fevers, chills or abnormal weight loss Eyes: Denies blurriness of vision Ears, nose, mouth, throat, and face: Denies mucositis or sore throat Respiratory: denies sob Cardiovascular: Denies palpitation, chest discomfort or lower extremity swelling Gastrointestinal:  Denies nausea, constipation, diarrhea GU: Denies dysuria or incontinence Skin: Denies abnormal skin rashes Neurological: Per HPI Musculoskeletal: Denies joint pain, back or neck discomfort. No decrease in ROM Behavioral/Psych: Denies anxiety, disturbance in thought content, and mood instability  Physical Exam: Vitals:   03/15/20 0929  BP: (!) 144/74   Pulse: 62  Resp: 18  Temp: 97.9 F (36.6 C)  SpO2: 91%   KPS: 80. General: Alert, cooperative, pleasant, in no acute distress Head: Biopsy scar noted, dry and intact. EENT: No conjunctival injection or scleral icterus. Oral mucosa moist Lungs: Resp effort normal Cardiac: Regular rate and rhythm Abdomen: Soft, non-distended abdomen Skin: No rashes cyanosis or petechiae. Extremities: No clubbing or edema  Neurologic Exam: Mental Status: Awake, alert, attentive to examiner. Oriented to self and environment. Language fluent with intact comprehension, repetition, reading. Cranial Nerves: Visual acuity is grossly normal. Visual fields are full. Extra-ocular movements intact. No ptosis. Face is symmetric, tongue midline. Motor: Tone and bulk are normal. Full power  in arms and legs. Reflexes are symmetric, no pathologic reflexes present. Intact finger to nose bilaterally Sensory: Intact to light touch and temperature Gait: Normal, independent   Labs: I have reviewed the data as listed    Component Value Date/Time   NA 142 12/29/2019 1619   K 4.8 12/29/2019 1619   CL 101 12/29/2019 1619   CO2 30 (H) 12/29/2019 1619   GLUCOSE 123 (H) 12/29/2019 1619   GLUCOSE 191 (H) 12/20/2018 0929   BUN 20 12/29/2019 1619   CREATININE 0.85 12/29/2019 1619   CREATININE 0.97 12/20/2018 0929   CALCIUM 9.5 12/29/2019 1619   PROT 6.9 12/29/2019 1619   ALBUMIN 4.3 12/29/2019 1619   AST 44 (H) 12/29/2019 1619   AST 50 (H) 12/20/2018 0929   ALT 49 (H) 12/29/2019 1619   ALT 59 (H) 12/20/2018 0929   ALKPHOS 97 12/29/2019 1619   BILITOT 0.7 12/29/2019 1619   BILITOT 0.5 12/20/2018 0929   GFRNONAA 93 12/29/2019 1619   GFRNONAA >60 12/20/2018 0929   GFRAA 107 12/29/2019 1619   GFRAA >60 12/20/2018 0929   Lab Results  Component Value Date   WBC 6.2 11/14/2019   NEUTROABS 3.6 11/14/2019   HGB 16.9 11/14/2019   HCT 51.5 11/14/2019   MCV 98.3 11/14/2019   PLT 139 (L) 11/14/2019     Imaging:  [facility] Clinician Interpretation: I have personally reviewed the CNS images as listed.  My interpretation, in the context of the patient's clinical presentation, is stable disease  MR BRAIN W WO CONTRAST  Result Date: 03/13/2020 CLINICAL DATA:  Primary CNS lymphoma.  Follow-up. EXAM: MRI HEAD WITHOUT AND WITH CONTRAST TECHNIQUE: Multiplanar, multiecho pulse sequences of the brain and surrounding structures were obtained without and with intravenous contrast. CONTRAST:  61m MULTIHANCE GADOBENATE DIMEGLUMINE 529 MG/ML IV SOLN COMPARISON:  10/27/2019 FINDINGS: Brain: Diffusion imaging does not show any acute or subacute infarction or other cause of restricted diffusion. No focal abnormality affects the brainstem or cerebellum. There has been previous left frontoparietal vertex burr hole for biopsy. Post biopsy and treatment changes at the left frontoparietal vertex are stable, with gliosis and some volume loss but no evidence of residual or recurrent tumor, abnormal enhancement or restricted diffusion. 6 mm cyst in the medial left frontal lobe anterior to that is also unchanged. Minimal T2 and FLAIR signal in the right splenium of the corpus callosum at the site of previous treated tumor. No evidence vascular disease. No hydrocephalus or extra-axial collection. No new or changing enhancement. Vascular: Major vessels at the base of the brain show flow. Skull and upper cervical spine: Negative Sinuses/Orbits: Clear/normal Other: None IMPRESSION: 1. Stable post biopsy and treatment changes at the left frontoparietal vertex without evidence of residual or recurrent tumor. 2. Stable 6 mm cyst in the medial left frontal lobe anterior to that. 3. Stable minimal T2 and FLAIR signal in the right splenium of the corpus callosum at the site of previous treated tumor. 4. No evidence of residual or recurrent disease. Electronically Signed   By: MNelson ChimesM.D.   On: 03/13/2020 12:39     Assessment/Plan 1.  Primary CNS lymphoma ([facility]  Mr. SWiddowsonis clinically and radiographically stable today.   We ask that Tim EPPLEreturn to clinic in 6 months following next brain MRI, or sooner as needed.  Continue Keppra [id number].    We appreciate the opportunity to participate in the care of Tim [name]  I have spent a total  of 30 minutes of face-to-face and non-face-to-face time, excluding clinical staff time, preparing to see patient, ordering tests and/or medications, counseling the patient, and independently interpreting results and communicating results to the patient/family/caregiver    Ventura Sellers, MD Medical Director of Neuro-Oncology [facility] at [place] 03/15/20 9:25 AM

## 2020-03-18 ENCOUNTER — Other Ambulatory Visit: Payer: Self-pay | Admitting: *Deleted

## 2020-03-18 DIAGNOSIS — C8589 Other specified types of non-Hodgkin lymphoma, extranodal and solid organ sites: Secondary | ICD-10-CM

## 2020-03-18 NOTE — Progress Notes (Signed)
Changed MRI order

## 2020-03-19 ENCOUNTER — Telehealth: Payer: Self-pay | Admitting: Internal Medicine

## 2020-03-19 NOTE — Telephone Encounter (Signed)
Scheduled per 11/5 los. Pt is aware of appt time and date.

## 2020-03-31 DIAGNOSIS — J449 Chronic obstructive pulmonary disease, unspecified: Secondary | ICD-10-CM | POA: Diagnosis not present

## 2020-03-31 DIAGNOSIS — J9611 Chronic respiratory failure with hypoxia: Secondary | ICD-10-CM | POA: Diagnosis not present

## 2020-04-07 NOTE — Progress Notes (Deleted)
Cardiology Office Note:    Date:  04/07/2020   ID:  [name], DOB 19-Jun-1955, MRN [id number]  PCP:  Bartholome Bill, MD  Cardiologist:  No primary care provider on file.  Electrophysiologist:  None   Referring MD: Bartholome Bill, MD   No chief complaint on file.   History of Present Illness:    Tim [name] is a 64 y.o. 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  He was referred by Dr. Luciana Axe for evaluation of dyspnea on exertion, initially seen on 09/04/2019.  He reports that he gets short of breath with minimal exertion.  States that walking to the bathroom and back causes him to become short of breath.  He has to rest when walking at the grocery store.  Denies any chest pain.  Reports that he has tried statins on 2 different occasions but was unable to tolerate, though he does not remember the side effects that he had.  He does not exercise.  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  He smoked at least 2 packs/day x 40 years, but quit 8 years ago.  No family history of heart disease in his immediate family.  TTE 12/2017 showed normal systolic function, mild left atrial dilatation, mild MR. TTE on 09/22/2019 showed LVEF 40 to 45%, global hypokinesis, grade 1 diastolic dysfunction, normal RV function, no significant valvular disease.  Calcium score on 09/22/2019 was 8 (31st percentile).  Coronary CTA on 10/27/2019 showed nonobstructive CAD with calcified plaque in left main and mid RCA causing minimal (0 to 24%) stenosis.  CMR 12/26/19 shows LVEF improved to nomral (56%), mild RV dysfunction (EF 45%), asymmetric LVH measuring up to 65mm, basal inferolateral midwall LGE (could represent prior myocarditis, though Fabry's disease on differential given LGE location with LVH and relatively low  native T1 values).  Since last clinic visit,  he reports he has been doing well.  Started Entresto about 2 weeks ago.  Reports has been checking BP, has been averaging 120/70s.  Denies any chest pain.  Does report dyspnea with minimal exertion.  Intermittent lightheadedness when he bends over, he denies any syncope.  No palpitations.     Past Medical History:  Diagnosis Date  . Arthritis   . Brain tumor ([place])   . COPD (chronic obstructive pulmonary disease) ([place])   . Diabetes mellitus without complication [facility])    patient states he was taken off meds at last appointment with PCP and is diet controlled  . Dyspnea   . Ejection fraction < 50% 2021  . Gout   . Hypertension 08/02/2017  . Pneumonia     Past Surgical History:  Procedure Laterality Date  . APPLICATION OF CRANIAL NAVIGATION Left 08/30/2017   Procedure: APPLICATION OF CRANIAL NAVIGATION;  Surgeon: Erline Levine, MD;  Location: [facility];  Service: Neurosurgery;  Laterality: Left;  . IR IMAGING GUIDED PORT INSERTION  12/27/2017  . IR REMOVAL TUN ACCESS W/ PORT W/O FL MOD SED  11/14/2019  . STERIOTACTIC STIMULATOR INSERTION Left 08/30/2017   Procedure: LEFT Frontal Sterotactic Brain Biopsy with BrainLab;  Surgeon: Erline Levine, MD;  Location: [facility];  Service: Neurosurgery;  Laterality: Left;    Current Medications: No outpatient medications have been marked as taking for the 04/09/20 encounter (Appointment) with Donato Heinz, MD.     Allergies:  Patient has no known allergies.   Social History   Socioeconomic History  . Marital status: Significant Other    Spouse name: [name]  . Number of children: Not on file  . Years of education: 10  . Highest education level: Bachelor's degree (e.g., BA, AB, BS)  Occupational History  . 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 Smoking status: Former Smoker    Packs/day: 2.00    Years: 40.00    Pack years: 80.00    Types: Cigarettes     Quit date: 08/10/2011    Years since quitting: 8.6  . Smokeless tobacco: Never Used  Vaping Use  . Vaping Use: Former  . Quit date: 05/12/2011  Substance and Sexual Activity  . Alcohol use: Yes    Alcohol/week: 14.0 standard drinks    Types: 14 Cans of beer per week  . Drug use: Never  . Sexual activity: Not Currently  Other Topics Concern  . Not on file  Social History Narrative  . Not on file   Social Determinants of Health   Financial Resource Strain:   . Difficulty of Paying Living Expenses: Not on file  Food Insecurity:   . Worried About Charity fundraiser in the Last Year: Not on file  . Ran Out of Food in the Last Year: Not on file  Transportation Needs:   . Lack of Transportation (Medical): Not on file  . Lack of Transportation (Non-Medical): Not on file  Physical Activity:   . Days of Exercise per Week: Not on file  . Minutes of Exercise per Session: Not on file  Stress:   . Feeling of Stress : Not on file  Social Connections:   . Frequency of Communication with Friends and Family: Not on file  . Frequency of Social Gatherings with Friends and Family: Not on file  . Attends Religious Services: Not on file  . Active Member of Clubs or Organizations: Not on file  . Attends Archivist Meetings: Not on file  . Marital Status: Not on file     Family History: The patient's family history includes Cancer in his brother.  ROS:   Please see the history of present illness.     All other systems reviewed and are negative.  EKGs/Labs/Other Studies Reviewed:    The following studies were reviewed today:   EKG:  EKG is  ordered today.  The ekg ordered demonstrates sinus bradycardia, rate 58, right bundle branch block Recent Labs: 11/14/2019: Hemoglobin 16.9; Platelets 139 12/29/2019: ALT 49; BUN 20; Creatinine, Ser 0.85; Potassium 4.8; Sodium 142  Recent Lipid Panel    Component Value Date/Time   CHOL 198 10/02/2019 1147   TRIG 364 (H) 10/02/2019 1147    HDL 39 (L) 10/02/2019 1147   CHOLHDL 5.1 (H) 10/02/2019 1147   LDLCALC 98 10/02/2019 1147    Physical Exam:    VS:  There were no vitals taken for this visit.    Wt Readings from Last 3 Encounters:  03/15/20 286 lb 6.4 oz (129.9 kg)  12/29/19 287 lb (130.2 kg)  10/31/19 282 lb 8 oz (128.1 kg)     GEN:  in no acute distress HEENT: Normal NECK: No JVD LYMPHATICS: No lymphadenopathy CARDIAC: RRR, no murmurs, rubs, gallops RESPIRATORY:  Clear to auscultation without rales, wheezing or rhonchi  ABDOMEN: Soft, non-tender, non-distended MUSCULOSKELETAL:  trace edema; No deformity  SKIN: Warm and dry NEUROLOGIC:  Alert  and oriented x 3 PSYCHIATRIC:  Normal affect   ASSESSMENT:    No diagnosis found. PLAN:    Chronic combined systolic and diastolic heart failure: EF 40 to 45% on TTE 09/22/19.  Does not appear volume overloaded on exam.  Nonischemic etiology, as coronary CTA on 10/27/2019 showed nonobstructive CAD with calcified plaque in left main and mid RCA causing minimal (0 to 24%) stenosis.  CMR 12/26/19 shows LVEF improved to nomral (56%), mild RV dysfunction (EF 45%), asymmetric LVH measuring up to 1mm, basal inferolateral midwall LGE (could represent prior myocarditis, though Fabry's disease on differential given LGE location with LVH and relatively low native T1 values). -Continue Entresto 49-51 mg twice daily.  Will check CMET -Continue Toprol-XL 50 mg daily -Continue spironolactone 12.5 mg daily -Will check alpha galactosidase to evaluate for Fabry's disease.  If unremarkable, then suspect myocarditis as cause of patient's systolic dysfunction, which has now recovered  Hypertension: On Entresto, Toprol-XL, and spironolactone as above.  Appears controlled.  Hyperlipidemia: Recommend statin given diabetes history.  Reports unable to tolerate statins in the past, but does not remember what his reaction was.  LDL 98 on 10/02/19.  Recommend statin for goal LDL less than 70, will  trial rosuvastatin 5 mg daily.  Type 2 diabetes: On insulin  RTC in 3 months  Medication Adjustments/Labs and Tests Ordered: Current medicines are reviewed at length with the patient today.  Concerns regarding medicines are outlined above.  No orders of the defined types were placed in this encounter.  No orders of the defined types were placed in this encounter.   There are no Patient Instructions on file for this visit.   Signed, Donato Heinz, MD  04/07/2020 2:12 PM     [facility]

## 2020-04-09 ENCOUNTER — Ambulatory Visit: Payer: PPO | Admitting: Cardiology

## 2020-04-16 ENCOUNTER — Telehealth: Payer: Self-pay | Admitting: Cardiology

## 2020-04-16 NOTE — Telephone Encounter (Signed)
Spoke with pt, he will complete his part of the application and bring it by the office for [place] to complete and send to the company.

## 2020-04-16 NOTE — Telephone Encounter (Signed)
Pt c/o medication issue:  1. Name of Medication: sacubitril-valsartan (ENTRESTO) 49-51 MG  2. How are you currently taking this medication (dosage and times per day)? 1 tablet twice a day  3. Are you having a reaction (difficulty breathing--STAT)? no  4. What is your medication issue? Patient states he received a letter that he will need to have the medication approved for the new year through [employer]. Please advise.

## 2020-04-19 NOTE — Telephone Encounter (Addendum)
NPAF application faxed to [employer] for [employer]

## 2020-04-30 DIAGNOSIS — J449 Chronic obstructive pulmonary disease, unspecified: Secondary | ICD-10-CM | POA: Diagnosis not present

## 2020-04-30 DIAGNOSIS — J9611 Chronic respiratory failure with hypoxia: Secondary | ICD-10-CM | POA: Diagnosis not present

## 2020-05-31 DIAGNOSIS — J449 Chronic obstructive pulmonary disease, unspecified: Secondary | ICD-10-CM | POA: Diagnosis not present

## 2020-05-31 DIAGNOSIS — J9611 Chronic respiratory failure with hypoxia: Secondary | ICD-10-CM | POA: Diagnosis not present

## 2020-07-01 DIAGNOSIS — J449 Chronic obstructive pulmonary disease, unspecified: Secondary | ICD-10-CM | POA: Diagnosis not present

## 2020-07-01 DIAGNOSIS — J9611 Chronic respiratory failure with hypoxia: Secondary | ICD-10-CM | POA: Diagnosis not present

## 2020-07-09 ENCOUNTER — Telehealth: Payer: Self-pay

## 2020-07-09 NOTE — Telephone Encounter (Signed)
Pt called wanting to know when his follow-up appt is with Dr. Mickeal Skinner. I advised the pt his appt is scheduled for 09/30/20 with a 9:15am arrival time. Pt expressed understanding.

## 2020-07-22 ENCOUNTER — Other Ambulatory Visit: Payer: Self-pay

## 2020-07-22 MED ORDER — SACUBITRIL-VALSARTAN 49-51 MG PO TABS: 1.0000 | ORAL_TABLET | Freq: Two times a day (BID) | ORAL | 3 refills | Status: DC

## 2020-07-22 NOTE — Telephone Encounter (Signed)
Left detailed VM per DPR that pt was approved for 2022 for pt assitance through [employer] for [employer]

## 2020-07-26 ENCOUNTER — Telehealth: Payer: Self-pay

## 2020-07-26 NOTE — Telephone Encounter (Signed)
Pt left a message requesting an order be submitted for his brain MRI. Pt stated when he called to schedule the scan, he was advised there was no order and he wants to make sure to have it done before his 07/31/20 appt with Dr. Mickeal Skinner.  I reviewed pts chart and I do see the MRI order with the SRS protocol. This order has a November 2022 expiration and an expected date of 09/06/20.  I have called the pt back and left a message advising his appt with Dr. Mickeal Skinner is 09/30/20 (not 3/23) and the order is in the system. Maybe they were referring to no PA having been done yet? Regardless, I advised the pt to call back if he had further questions/concerns.

## 2020-07-29 DIAGNOSIS — J449 Chronic obstructive pulmonary disease, unspecified: Secondary | ICD-10-CM | POA: Diagnosis not present

## 2020-07-29 DIAGNOSIS — J9611 Chronic respiratory failure with hypoxia: Secondary | ICD-10-CM | POA: Diagnosis not present

## 2020-08-05 DIAGNOSIS — J432 Centrilobular emphysema: Secondary | ICD-10-CM | POA: Diagnosis not present

## 2020-08-05 DIAGNOSIS — J9611 Chronic respiratory failure with hypoxia: Secondary | ICD-10-CM | POA: Diagnosis not present

## 2020-08-05 DIAGNOSIS — Z87891 Personal history of nicotine dependence: Secondary | ICD-10-CM | POA: Diagnosis not present

## 2020-08-05 DIAGNOSIS — Z9981 Dependence on supplemental oxygen: Secondary | ICD-10-CM | POA: Diagnosis not present

## 2020-08-19 ENCOUNTER — Other Ambulatory Visit: Payer: Self-pay | Admitting: Cardiology

## 2020-08-19 NOTE — Telephone Encounter (Signed)
This is Dr. Schumann's pt 

## 2020-08-27 DIAGNOSIS — H43812 Vitreous degeneration, left eye: Secondary | ICD-10-CM | POA: Diagnosis not present

## 2020-08-27 DIAGNOSIS — H4312 Vitreous hemorrhage, left eye: Secondary | ICD-10-CM | POA: Diagnosis not present

## 2020-08-29 DIAGNOSIS — J9611 Chronic respiratory failure with hypoxia: Secondary | ICD-10-CM | POA: Diagnosis not present

## 2020-08-29 DIAGNOSIS — J449 Chronic obstructive pulmonary disease, unspecified: Secondary | ICD-10-CM | POA: Diagnosis not present

## 2020-09-02 DIAGNOSIS — H2513 Age-related nuclear cataract, bilateral: Secondary | ICD-10-CM | POA: Diagnosis not present

## 2020-09-02 DIAGNOSIS — H3090 Unspecified chorioretinal inflammation, unspecified eye: Secondary | ICD-10-CM | POA: Diagnosis not present

## 2020-09-02 DIAGNOSIS — H43392 Other vitreous opacities, left eye: Secondary | ICD-10-CM | POA: Diagnosis not present

## 2020-09-02 DIAGNOSIS — H43813 Vitreous degeneration, bilateral: Secondary | ICD-10-CM | POA: Diagnosis not present

## 2020-09-02 DIAGNOSIS — Z7689 Persons encountering health services in other specified circumstances: Secondary | ICD-10-CM | POA: Diagnosis not present

## 2020-09-02 DIAGNOSIS — E119 Type 2 diabetes mellitus without complications: Secondary | ICD-10-CM | POA: Diagnosis not present

## 2020-09-06 ENCOUNTER — Other Ambulatory Visit: Payer: Self-pay | Admitting: Radiation Therapy

## 2020-09-12 ENCOUNTER — Ambulatory Visit: Payer: PPO | Admitting: Internal Medicine

## 2020-09-23 ENCOUNTER — Telehealth: Payer: Self-pay | Admitting: *Deleted

## 2020-09-23 NOTE — Telephone Encounter (Signed)
Patient called to report that he is experiencing blurred vision worse in the left eye.  He reports it is similar to looking through a Public librarian.  He went to eye doctor and was told that it wasn't diabetes related or cataracts.  He has MRI on Wednesday and eye doctor wanted him to have MRI done to rule out any association with the Lymphoma.  We moved up his follow up appt to review the MRI results so that he could expedite the follow up with the eye doctor which he is due to go back on Wednesday May 25th.

## 2020-09-24 ENCOUNTER — Other Ambulatory Visit: Payer: Self-pay | Admitting: Internal Medicine

## 2020-09-25 ENCOUNTER — Ambulatory Visit
Admission: RE | Admit: 2020-09-25 | Discharge: 2020-09-25 | Disposition: A | Discharge status: Home / Self Care | Payer: PPO | Source: Ambulatory Visit | Attending: Internal Medicine | Admitting: Internal Medicine

## 2020-09-25 DIAGNOSIS — C8589 Other specified types of non-Hodgkin lymphoma, extranodal and solid organ sites: Secondary | ICD-10-CM | POA: Diagnosis not present

## 2020-09-25 DIAGNOSIS — Q046 Congenital cerebral cysts: Secondary | ICD-10-CM | POA: Diagnosis not present

## 2020-09-25 DIAGNOSIS — G9389 Other specified disorders of brain: Secondary | ICD-10-CM | POA: Diagnosis not present

## 2020-09-25 DIAGNOSIS — J3489 Other specified disorders of nose and nasal sinuses: Secondary | ICD-10-CM | POA: Diagnosis not present

## 2020-09-25 MED ORDER — GADOBENATE DIMEGLUMINE 529 MG/ML IV SOLN
20.0000 mL | Freq: Once | INTRAVENOUS | Status: AC | PRN
  Administered 2020-09-25: 20 mL via INTRAVENOUS

## 2020-09-26 ENCOUNTER — Other Ambulatory Visit: Payer: Self-pay

## 2020-09-26 ENCOUNTER — Inpatient Hospital Stay: Payer: PPO | Attending: Internal Medicine | Admitting: Internal Medicine

## 2020-09-26 VITALS — BP 149/66 | HR 63 | Temp 97.8°F | Resp 20 | Ht 76.0 in | Wt 287.3 lb

## 2020-09-26 DIAGNOSIS — Z87891 Personal history of nicotine dependence: Secondary | ICD-10-CM | POA: Insufficient documentation

## 2020-09-26 DIAGNOSIS — Z79899 Other long term (current) drug therapy: Secondary | ICD-10-CM | POA: Insufficient documentation

## 2020-09-26 DIAGNOSIS — C8589 Other specified types of non-Hodgkin lymphoma, extranodal and solid organ sites: Secondary | ICD-10-CM | POA: Diagnosis not present

## 2020-09-26 NOTE — Progress Notes (Signed)
[facility] [place], [place] [address] [phone]   Interval Evaluation  Date of Service: 09/26/20 Patient Name: Tim [name] Patient MRN: [id number] Patient DOB: 04/14/1956 Provider: Ventura Sellers, MD  Identifying Statement:  Tim [name] is a 65 y.o. 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 Vertell Limber 01/31/18: Completes 3rd cycle of HD-MTX and Rituximab 07/26/18: Completes 5th cycle of 5-day Temodar consolidation  Interval History:  Tim [name] presents today for follow up after recent MRI brain. Today he describes blurry vision and floaters in his left eye.  Onset was 1 month ago, worsened for a couple of weeks, and has since stabilized.  Is seeing a vitreous/retina specialist (Dr. Iona Hansen) who will be performing slit lamp exam, and further workup including biopsy if necessary.  Otherwise denies progressive neurologic complaints, no language issues.  Continues on keppra 250 twice per day.  Medications: Current Outpatient Medications on File Prior to Visit  Medication Sig Dispense Refill  . metoprolol succinate (TOPROL-XL) 50 MG 24 hr tablet TAKE 1 TABLET BY MOUTH DAILY 90 tablet 2  . spironolactone (ALDACTONE) 25 MG tablet TAKE 1/2 TABLET BY MOUTH DAILY 45 tablet 5  . albuterol (PROVENTIL HFA;VENTOLIN HFA) 108 (90 Base) MCG/ACT inhaler Inhale 2 puffs into the lungs every 4 (four) hours as needed for wheezing or shortness of breath. 1 Inhaler 0  . allopurinol (ZYLOPRIM) 300 MG tablet Take 300 mg by mouth daily.    Marland Kitchen b complex vitamins tablet Take 1 tablet by mouth daily.    . blood glucose meter kit and supplies KIT Dispense based on patient and insurance preference. Use up to four times daily as directed. (FOR ICD-9 250.00, 250.01). 1 each 0  . Insulin Isophane & Regular Human (NOVOLIN 70/30 FLEXPEN) (70-30) 100 UNIT/ML PEN Inject 15 Units  into the skin 2 (two) times daily. 15 mL 11  . Insulin Pen Needle (PEN NEEDLES 3/16") 31G X 5 MM MISC 1 Container by Does not apply route 2 (two) times daily. 100 each 0  . levETIRAcetam (KEPPRA) 500 MG tablet TAKE 1 TABLET BY MOUTH TWICE A DAY 60 tablet 5  . Multiple Vitamin (MULTIVITAMIN) tablet Take 1 tablet by mouth daily.    Glory Rosebush VERIO test strip 1 each daily.    . rosuvastatin (CRESTOR) 5 MG tablet Take 1 tablet (5 mg total) by mouth daily. 90 tablet 3  . sacubitril-valsartan (ENTRESTO) 49-51 MG Take 1 tablet by mouth 2 (two) times daily. 180 tablet 3  . Tiotropium Bromide-Olodaterol (STIOLTO RESPIMAT) 2.5-2.5 MCG/ACT AERS Inhale into the lungs.     Current Facility-Administered Medications on File Prior to Visit  Medication Dose Route Frequency Provider Last Rate Last Admin  . heparin lock flush 100 unit/mL  500 Units Intracatheter Once Ventura Sellers, MD      . sodium chloride flush (NS) 0.9 % injection 10 mL  10 mL Intracatheter Once Lachandra Dettmann, [name], MD        Allergies: No Known Allergies   Past Medical History:  Past Medical History:  Diagnosis Date  . Arthritis   . Brain tumor ([place])   . COPD (chronic obstructive pulmonary disease) ([place])   . Diabetes mellitus without complication [facility])    patient states he was taken off meds at last appointment with PCP and is diet controlled  . Dyspnea   . Ejection fraction < 50% 2021  .  Gout   . Hypertension 08/02/2017  . Pneumonia    Past Surgical History: none    Social History:  Social History   Socioeconomic History  . Marital status: Significant Other    Spouse name: Tim [name]  . Number of children: Not on file  . Years of education: 67  . Highest education level: Bachelor's degree (e.g., BA, AB, BS)  Occupational History  . 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 Smoking status: Former Smoker    Packs/day: 2.00    Years: 40.00    Pack years: 80.00    Types: Cigarettes     Quit date: 08/10/2011    Years since quitting: 9.1  . Smokeless tobacco: Never Used  Vaping Use  . Vaping Use: Former  . Quit date: 05/12/2011  Substance and Sexual Activity  . Alcohol use: Yes    Alcohol/week: 14.0 standard drinks    Types: 14 Cans of beer per week  . Drug use: Never  . Sexual activity: Not Currently  Other Topics Concern  . Not on file  Social History Narrative  . Not on file   Social Determinants of Health   Financial Resource Strain: Not on file  Food Insecurity: Not on file  Transportation Needs: Not on file  Physical Activity: Not on file  Stress: Not on file  Social Connections: Not on file  Intimate Partner Violence: Not on file   Family History:  Family History  Problem Relation Age of Onset  . Cancer Brother     Review of Systems: Constitutional: Denies fevers, chills or abnormal weight loss Eyes: Denies blurriness of vision Ears, nose, mouth, throat, and face: Denies mucositis or sore throat Respiratory: denies sob Cardiovascular: Denies palpitation, chest discomfort or lower extremity swelling Gastrointestinal:  Denies nausea, constipation, diarrhea GU: Denies dysuria or incontinence Skin: Denies abnormal skin rashes Neurological: Per HPI Musculoskeletal: Denies joint pain, back or neck discomfort. No decrease in ROM Behavioral/Psych: Denies anxiety, disturbance in thought content, and mood instability  Physical Exam: Vitals:   09/26/20 0941  BP: (!) 149/66  Pulse: 63  Resp: 20  Temp: 97.8 F (36.6 C)  SpO2: 94%   KPS: 80. General: Alert, cooperative, pleasant, in no acute distress Head: Biopsy scar noted, dry and intact. EENT: No conjunctival injection or scleral icterus. Oral mucosa moist Lungs: Resp effort normal Cardiac: Regular rate and rhythm Abdomen: Soft, non-distended abdomen Skin: No rashes cyanosis or petechiae. Extremities: No clubbing or edema  Neurologic Exam: Mental Status: Awake, alert, attentive to examiner.  Oriented to self and environment. Language fluent with intact comprehension, repetition, reading. Cranial Nerves: Visual acuity is grossly normal. Visual fields are full. Extra-ocular movements intact. No ptosis. Face is symmetric, tongue midline. Motor: Tone and bulk are normal. Full power in arms and legs. Reflexes are symmetric, no pathologic reflexes present. Intact finger to nose bilaterally Sensory: Intact to light touch and temperature Gait: Normal, independent   Labs: I have reviewed the data as listed    Component Value Date/Time   NA 142 12/29/2019 1619   K 4.8 12/29/2019 1619   CL 101 12/29/2019 1619   CO2 30 (H) 12/29/2019 1619   GLUCOSE 123 (H) 12/29/2019 1619   GLUCOSE 191 (H) 12/20/2018 0929   BUN 20 12/29/2019 1619   CREATININE 0.85 12/29/2019 1619   CREATININE 0.97 12/20/2018 0929   CALCIUM 9.5 12/29/2019 1619   PROT 6.9 12/29/2019 1619   ALBUMIN 4.3 12/29/2019  1619   AST 44 (H) 12/29/2019 1619   AST 50 (H) 12/20/2018 0929   ALT 49 (H) 12/29/2019 1619   ALT 59 (H) 12/20/2018 0929   ALKPHOS 97 12/29/2019 1619   BILITOT 0.7 12/29/2019 1619   BILITOT 0.5 12/20/2018 0929   GFRNONAA 93 12/29/2019 1619   GFRNONAA >60 12/20/2018 0929   GFRAA 107 12/29/2019 1619   GFRAA >60 12/20/2018 0929   Lab Results  Component Value Date   WBC 6.2 11/14/2019   NEUTROABS 3.6 11/14/2019   HGB 16.9 11/14/2019   HCT 51.5 11/14/2019   MCV 98.3 11/14/2019   PLT 139 (L) 11/14/2019    Imaging:  [place] Clinician Interpretation: I have personally reviewed the MRI images from 09/25/20.  My interpretation, in the context of the patient's clinical presentation, is stable disease  Pending official radiology interpretation No results found.   Assessment/Plan 1. Primary CNS lymphoma [facility])  Mr. Wedemeyer is clinically stable today from focal neurologic standpoint.  His visual symptoms and objective findings do give concern to possibility of lymphoma recurrence within the vitreous.  Other  etiologies include infectious, inflammatories syndromes.  This workup is ongoing; next appointment with Dr. Iona Hansen is next week.  His MRI today demonstrates stable disease, with previously visualized regions of T2 signal abnormality overall unchanged.  We do not appreciate any enhancing disease.     Pending results from ophthalmologic workup, we ask that [name] return to clinic in 6 months following next brain MRI, or sooner as needed.  Continue Keppra 226m BID.    We appreciate the opportunity to participate in the care of CRALSTON VENUS   I have spent a total of 40 minutes of face-to-face and non-face-to-face time, excluding clinical staff time, preparing to see patient, ordering tests and/or medications, counseling the patient, and independently interpreting results and communicating results to the patient/family/caregiver    ZVentura Sellers MD Medical Director of Neuro-Oncology [facility] [place]05/19/22 9:32 AM

## 2020-09-28 DIAGNOSIS — J9611 Chronic respiratory failure with hypoxia: Secondary | ICD-10-CM | POA: Diagnosis not present

## 2020-09-28 DIAGNOSIS — J449 Chronic obstructive pulmonary disease, unspecified: Secondary | ICD-10-CM | POA: Diagnosis not present

## 2020-09-30 ENCOUNTER — Ambulatory Visit: Payer: PPO | Admitting: Internal Medicine

## 2020-09-30 ENCOUNTER — Inpatient Hospital Stay: Payer: PPO

## 2020-10-04 DIAGNOSIS — H43813 Vitreous degeneration, bilateral: Secondary | ICD-10-CM | POA: Diagnosis not present

## 2020-10-04 DIAGNOSIS — H43392 Other vitreous opacities, left eye: Secondary | ICD-10-CM | POA: Diagnosis not present

## 2020-10-04 DIAGNOSIS — E119 Type 2 diabetes mellitus without complications: Secondary | ICD-10-CM | POA: Diagnosis not present

## 2020-10-04 DIAGNOSIS — H2513 Age-related nuclear cataract, bilateral: Secondary | ICD-10-CM | POA: Diagnosis not present

## 2020-10-29 DIAGNOSIS — J9611 Chronic respiratory failure with hypoxia: Secondary | ICD-10-CM | POA: Diagnosis not present

## 2020-10-29 DIAGNOSIS — J449 Chronic obstructive pulmonary disease, unspecified: Secondary | ICD-10-CM | POA: Diagnosis not present

## 2020-11-01 DIAGNOSIS — H4389 Other disorders of vitreous body: Secondary | ICD-10-CM | POA: Diagnosis not present

## 2020-11-05 ENCOUNTER — Telehealth: Payer: Self-pay | Admitting: *Deleted

## 2020-11-05 NOTE — Telephone Encounter (Signed)
Patient called to report that he saw Dr Carmelina Dane at [facility].  He states that he wants to follow him in 10 weeks and see If the blurred vision has improved.   He also asked that the patient get a chest xray.  Patient states he had a copy of the order but the order is not physically signed therefore he can not take the order to [facility] to get it completed.  Unsure of why Dr Tomi Likens needed CXR so I reached out to Dr Georgie Chard staff assistant Karl Luke at [phone] to request copy of office notes and request for cxr.   Routed to Dr Mickeal Skinner to see if he would be agreeable to getting a chest xray ordered under his name.  Pending fax and response.

## 2020-11-28 DIAGNOSIS — J9611 Chronic respiratory failure with hypoxia: Secondary | ICD-10-CM | POA: Diagnosis not present

## 2020-11-28 DIAGNOSIS — J449 Chronic obstructive pulmonary disease, unspecified: Secondary | ICD-10-CM | POA: Diagnosis not present

## 2020-12-29 DIAGNOSIS — J9611 Chronic respiratory failure with hypoxia: Secondary | ICD-10-CM | POA: Diagnosis not present

## 2020-12-29 DIAGNOSIS — J449 Chronic obstructive pulmonary disease, unspecified: Secondary | ICD-10-CM | POA: Diagnosis not present

## 2021-01-10 DIAGNOSIS — H4389 Other disorders of vitreous body: Secondary | ICD-10-CM | POA: Diagnosis not present

## 2021-01-29 DIAGNOSIS — J449 Chronic obstructive pulmonary disease, unspecified: Secondary | ICD-10-CM | POA: Diagnosis not present

## 2021-01-29 DIAGNOSIS — J9611 Chronic respiratory failure with hypoxia: Secondary | ICD-10-CM | POA: Diagnosis not present

## 2021-02-13 ENCOUNTER — Ambulatory Visit
Admission: RE | Admit: 2021-02-13 | Discharge: 2021-02-13 | Disposition: A | Discharge status: Home / Self Care | Payer: PPO | Source: Ambulatory Visit | Attending: Ophthalmology | Admitting: Ophthalmology

## 2021-02-13 ENCOUNTER — Other Ambulatory Visit: Payer: Self-pay | Admitting: Ophthalmology

## 2021-02-13 DIAGNOSIS — H4389 Other disorders of vitreous body: Secondary | ICD-10-CM

## 2021-02-13 DIAGNOSIS — H538 Other visual disturbances: Secondary | ICD-10-CM | POA: Diagnosis not present

## 2021-02-13 DIAGNOSIS — I700 Atherosclerosis of aorta: Secondary | ICD-10-CM | POA: Diagnosis not present

## 2021-02-28 DIAGNOSIS — J9611 Chronic respiratory failure with hypoxia: Secondary | ICD-10-CM | POA: Diagnosis not present

## 2021-02-28 DIAGNOSIS — J449 Chronic obstructive pulmonary disease, unspecified: Secondary | ICD-10-CM | POA: Diagnosis not present

## 2021-03-06 ENCOUNTER — Other Ambulatory Visit: Payer: Self-pay | Admitting: Radiation Therapy

## 2021-03-06 DIAGNOSIS — M1A00X0 Idiopathic chronic gout, unspecified site, without tophus (tophi): Secondary | ICD-10-CM | POA: Diagnosis not present

## 2021-03-06 DIAGNOSIS — E1142 Type 2 diabetes mellitus with diabetic polyneuropathy: Secondary | ICD-10-CM | POA: Diagnosis not present

## 2021-03-06 DIAGNOSIS — Z794 Long term (current) use of insulin: Secondary | ICD-10-CM | POA: Diagnosis not present

## 2021-03-06 DIAGNOSIS — H60391 Other infective otitis externa, right ear: Secondary | ICD-10-CM | POA: Diagnosis not present

## 2021-03-24 ENCOUNTER — Ambulatory Visit: Payer: PPO | Admitting: Internal Medicine

## 2021-03-27 ENCOUNTER — Ambulatory Visit
Admission: RE | Admit: 2021-03-27 | Discharge: 2021-03-27 | Disposition: A | Discharge status: Home / Self Care | Payer: PPO | Source: Ambulatory Visit | Attending: Internal Medicine | Admitting: Internal Medicine

## 2021-03-27 DIAGNOSIS — C8589 Other specified types of non-Hodgkin lymphoma, extranodal and solid organ sites: Secondary | ICD-10-CM

## 2021-03-27 DIAGNOSIS — C719 Malignant neoplasm of brain, unspecified: Secondary | ICD-10-CM | POA: Diagnosis not present

## 2021-03-27 MED ORDER — GADOBENATE DIMEGLUMINE 529 MG/ML IV SOLN
20.0000 mL | Freq: Once | INTRAVENOUS | Status: AC | PRN
  Administered 2021-03-27: 15:00:00 20 mL via INTRAVENOUS

## 2021-03-31 DIAGNOSIS — J449 Chronic obstructive pulmonary disease, unspecified: Secondary | ICD-10-CM | POA: Diagnosis not present

## 2021-03-31 DIAGNOSIS — J9611 Chronic respiratory failure with hypoxia: Secondary | ICD-10-CM | POA: Diagnosis not present

## 2021-04-01 ENCOUNTER — Inpatient Hospital Stay: Payer: PPO | Attending: Internal Medicine | Admitting: Internal Medicine

## 2021-04-01 ENCOUNTER — Other Ambulatory Visit: Payer: Self-pay

## 2021-04-01 VITALS — BP 151/73 | HR 69 | Temp 97.7°F | Resp 17 | Ht 76.0 in | Wt 284.9 lb

## 2021-04-01 DIAGNOSIS — C8589 Other specified types of non-Hodgkin lymphoma, extranodal and solid organ sites: Secondary | ICD-10-CM | POA: Insufficient documentation

## 2021-04-01 NOTE — Progress Notes (Signed)
[facility] [facility] [place], [place] [address] [phone]   Interval Evaluation  Date of Service: 04/01/21 Patient Name: Tim [name] Patient MRN: [id number] Patient DOB: [birth year] Provider: Ventura Sellers, MD  Identifying Statement:  Tim [name] is a 64 y.o. male with left frontal  CNS lymphoma  Treatment Summary: 08/30/17: L frontal stereotactic biopsy with Dr. Vertell Limber 01/31/18: Completes 3rd cycle of HD-MTX and Rituximab 07/26/18: Completes 5th cycle of 5-day Temodar consolidation  Interval History:  Tim [name] presents today for follow up after recent MRI brain. No changed in blurry vision, left eye floaters.  He is now following with vitreal specialist at [facility], no intervention recommended at this time. Otherwise denies progressive neurologic complaints, no language issues.  Continues on keppra 250 twice per day.  Medications: Current Outpatient Medications on File Prior to Visit  Medication Sig Dispense Refill   allopurinol (ZYLOPRIM) 300 MG tablet Take 300 mg by mouth daily.     b complex vitamins tablet Take 1 tablet by mouth daily.     blood glucose meter kit and supplies KIT Dispense based on patient and insurance preference. Use up to four times daily as directed. (FOR ICD-9 250.00, 250.01). 1 each 0   Insulin Isophane & Regular Human (NOVOLIN 70/30 FLEXPEN) (70-30) 100 UNIT/ML PEN Inject 15 Units into the skin 2 (two) times daily. 15 mL 11   Insulin Pen Needle (PEN NEEDLES 3/16") 31G X 5 MM MISC 1 Container by Does not apply route 2 (two) times daily. 100 each 0   levETIRAcetam (KEPPRA) 500 MG tablet TAKE 1 TABLET BY MOUTH TWICE A DAY 60 tablet 5   metoprolol succinate (TOPROL-XL) 50 MG 24 hr tablet TAKE 1 TABLET BY MOUTH DAILY 90 tablet 2   Multiple Vitamin (MULTIVITAMIN) tablet Take 1 tablet by mouth daily.     ONETOUCH VERIO test strip 1 each daily.     sacubitril-valsartan (ENTRESTO) 49-51 MG  Take 1 tablet by mouth 2 (two) times daily. 180 tablet 3   spironolactone (ALDACTONE) 25 MG tablet TAKE 1/2 TABLET BY MOUTH DAILY 45 tablet 5   Tiotropium Bromide-Olodaterol (STIOLTO RESPIMAT) 2.5-2.5 MCG/ACT AERS Inhale into the lungs.     Current Facility-Administered Medications on File Prior to Visit  Medication Dose Route Frequency Provider Last Rate Last Admin   heparin lock flush 100 unit/mL  500 Units Intracatheter Once , [name], MD       sodium chloride flush (NS) 0.9 % injection 10 mL  10 mL Intracatheter Once Ventura Sellers, MD        Allergies: No Known Allergies   Past Medical History:  Past Medical History:  Diagnosis Date   Arthritis    Brain tumor ([place])    COPD (chronic obstructive pulmonary disease) ([place])    Diabetes mellitus without complication ([place])    patient states he was taken off meds at last appointment with PCP and is diet controlled   Dyspnea    Ejection fraction < 50% 2021   Gout    Hypertension 08/02/2017   Pneumonia    Past Surgical History: none    Social History:  Social History   Socioeconomic History   Marital status: Significant Other    Spouse name: [name]   Number of children: Not on file   Years of education: 16   Highest education level: Bachelor's degree (e.g., BA, AB, BS)  Occupational History   Occupation: retired  Employer: [employer]    Comment: disability  Tobacco Use   Smoking status: Former    Packs/day: 2.00    Years: 40.00    Pack years: 80.00    Types: Cigarettes    Quit date: 08/10/2011    Years since quitting: 9.6   Smokeless tobacco: Never  Vaping Use   Vaping Use: Former   Quit date: 05/12/2011  Substance and Sexual Activity   Alcohol use: Yes    Alcohol/week: 14.0 standard drinks    Types: 14 Cans of beer per week   Drug use: Never   Sexual activity: Not Currently  Other Topics Concern   Not on file  Social History Narrative   Not on file   Social Determinants of Health   Financial Resource  Strain: Not on file  Food Insecurity: Not on file  Transportation Needs: Not on file  Physical Activity: Not on file  Stress: Not on file  Social Connections: Not on file  Intimate Partner Violence: Not on file   Family History:  Family History  Problem Relation Age of Onset   Cancer Brother     Review of Systems: Constitutional: Denies fevers, chills or abnormal weight loss Eyes: Denies blurriness of vision Ears, nose, mouth, throat, and face: Denies mucositis or sore throat Respiratory: denies sob Cardiovascular: Denies palpitation, chest discomfort or lower extremity swelling Gastrointestinal:  Denies nausea, constipation, diarrhea GU: Denies dysuria or incontinence Skin: Denies abnormal skin rashes Neurological: Per HPI Musculoskeletal: Denies joint pain, back or neck discomfort. No decrease in ROM Behavioral/Psych: Denies anxiety, disturbance in thought content, and mood instability  Physical Exam: Vitals:   04/01/21 1244  BP: (!) 151/73  Pulse: 69  Resp: 17  Temp: 97.7 F (36.5 C)  SpO2: 93%   KPS: 80. General: Alert, cooperative, pleasant, in no acute distress Head: Biopsy scar noted, dry and intact. EENT: No conjunctival injection or scleral icterus. Oral mucosa moist Lungs: Resp effort normal Cardiac: Regular rate and rhythm Abdomen: Soft, non-distended abdomen Skin: No rashes cyanosis or petechiae. Extremities: No clubbing or edema  Neurologic Exam: Mental Status: Awake, alert, attentive to examiner. Oriented to self and environment. Language fluent with intact comprehension, repetition, reading. Cranial Nerves: Visual acuity is grossly normal. Visual fields are full. Extra-ocular movements intact. No ptosis. Face is symmetric, tongue midline. Motor: Tone and bulk are normal. Full power in arms and legs. Reflexes are symmetric, no pathologic reflexes present. Intact finger to nose bilaterally Sensory: Intact to light touch and temperature Gait: Normal,  independent   Labs: I have reviewed the data as listed    Component Value Date/Time   NA 142 12/29/2019 1619   K 4.8 12/29/2019 1619   CL 101 12/29/2019 1619   CO2 30 (H) 12/29/2019 1619   GLUCOSE 123 (H) 12/29/2019 1619   GLUCOSE 191 (H) 12/20/2018 0929   BUN 20 12/29/2019 1619   CREATININE 0.85 12/29/2019 1619   CREATININE 0.97 12/20/2018 0929   CALCIUM 9.5 12/29/2019 1619   PROT 6.9 12/29/2019 1619   ALBUMIN 4.3 12/29/2019 1619   AST 44 (H) 12/29/2019 1619   AST 50 (H) 12/20/2018 0929   ALT 49 (H) 12/29/2019 1619   ALT 59 (H) 12/20/2018 0929   ALKPHOS 97 12/29/2019 1619   BILITOT 0.7 12/29/2019 1619   BILITOT 0.5 12/20/2018 0929   GFRNONAA 93 12/29/2019 1619   GFRNONAA >60 12/20/2018 0929   GFRAA 107 12/29/2019 1619   GFRAA >60 12/20/2018 0929   Lab Results  Component  Value Date   WBC 6.2 11/14/2019   NEUTROABS 3.6 11/14/2019   HGB 16.9 11/14/2019   HCT 51.5 11/14/2019   MCV 98.3 11/14/2019   PLT 139 (L) 11/14/2019    Imaging:  [facility] Clinician Interpretation: I have personally reviewed the MRI images from 09/25/20.  My interpretation, in the context of the patient's clinical presentation, is stable disease  Pending official radiology interpretation MR BRAIN W WO CONTRAST  Result Date: 03/28/2021 CLINICAL DATA:  Brain/CNS neoplasm, assess treatment response; primary CNS lymphoma EXAM: MRI HEAD WITHOUT AND WITH CONTRAST TECHNIQUE: Multiplanar, multiecho pulse sequences of the brain and surrounding structures were obtained without and with intravenous contrast. CONTRAST:  60m MULTIHANCE GADOBENATE DIMEGLUMINE 529 MG/ML IV SOLN COMPARISON:  May 2022 FINDINGS: Brain: Post biopsy changes are again identified in the high left frontal lobe extending to the body of the lateral ventricle with mild ex vacuo dilatation. Additional biopsy changes in the more anterior left superior frontal gyrus. No new mass or abnormal enhancement. Ventricles are stable in size. Vascular: Major  vessel flow voids at the skull base are preserved. Skull and upper cervical spine: Normal marrow signal is preserved. Sinuses/Orbits: Paranasal sinuses are aerated. Orbits are unremarkable. Other: Sella is unremarkable.  Mastoid air cells are clear. IMPRESSION: Stable appearance without evidence of recurrent disease. Electronically Signed   By: PMacy MisM.D.   On: 03/28/2021 09:33     Assessment/Plan 1. Primary CNS lymphoma ([facility]  Mr. SBlausteinis clinically and radiographically stable today.  [facility]  CRoyal Hawthornshould return to clinic in 6 months following next brain MRI, or sooner as needed.  He may trial discontinuation of Keppra due to prolonged seizure freedom, fatigue and irritability complaints.  We appreciate the opportunity to participate in the care of Tim [name]   I have spent a total of 30 minutes of face-to-face and non-face-to-face time, excluding clinical staff time, preparing to see patient, ordering tests and/or medications, counseling the patient, and independently interpreting results and communicating results to the patient/family/caregiver    ZVentura Sellers MD Medical Director of Neuro-Oncology [facility]11/22/22 1:09 PM

## 2021-04-15 NOTE — Progress Notes (Signed)
Cardiology Clinic Note   Patient Name: PERCY WINTERROWD Date of Encounter: 04/16/2021  Primary Care Provider:  Bartholome Bill, MD Primary Cardiologist:  Donato Heinz, MD  Patient Profile    ZIAN DELAIR 65 year old male presents to the clinic today for follow-up evaluation of his essential hypertension and chronic combined systolic and diastolic CHF.  Past Medical History    Past Medical History:  Diagnosis Date   Arthritis    Brain tumor [facility] [place])    COPD (chronic obstructive pulmonary disease) ([place])    Diabetes mellitus without complication ([place])    patient states he was taken off meds at last appointment with PCP and is diet controlled   Dyspnea    Ejection fraction < 50% 2021   Gout    Hypertension 08/02/2017   Pneumonia    Past Surgical History:  Procedure Laterality Date   APPLICATION OF CRANIAL NAVIGATION Left 08/30/2017   Procedure: APPLICATION OF CRANIAL NAVIGATION;  Surgeon: Erline Levine, MD;  Location: [place];  Service: Neurosurgery;  Laterality: Left;   IR IMAGING GUIDED PORT INSERTION  12/27/2017   IR REMOVAL TUN ACCESS W/ PORT W/O FL MOD SED  11/14/2019   STERIOTACTIC STIMULATOR INSERTION Left 08/30/2017   Procedure: LEFT Frontal Sterotactic Brain Biopsy with BrainLab;  Surgeon: Erline Levine, MD;  Location: [facility];  Service: Neurosurgery;  Laterality: Left;    Allergies  No Known Allergies  History of Present Illness    HASHIR DELEEUW has a PMH of essential hypertension, chronic combined systolic and diastolic CHF, COPD, diabetes, dyspnea on exertion, and bilateral lower extremity swelling.  His PMH also includes CNS lymphoma.  He was referred by Dr. Luciana Axe for evaluation of his dyspnea on exertion.  He was initially seen on 09/04/2019.  He reported that he would get short of breath with minimal exertion.  1 example was shortness of breath with walking to the bathroom.  He reported having to rest while walking in the grocery store.  He  denied chest pain.  He reported statin intolerance.  He did not exercise.  His lymphoma was treated with chemotherapy and he reported no recurrence of disease.  He smoked at least 2 packs/day for 40 years and quit 9 years ago.  There is no family history of heart disease.  His echocardiogram 8/19 showed normal systolic function, mild left atrial dilation, mild mitral regurgitation.  His echocardiogram 09/22/2019 showed an EF of 40-45%, global hypokinesis, G1 DD, normal RV function and no significant valvular disease.  His coronary calcium score 5/21 showed a score of 8 which placed him in the 31st percentile.  His coronary CTA 10/27/2019 showed nonobstructive CAD with calcified plaque in his left main and mid RCA 0-24% stenosis.  He had cardiac MRI 12/26/2019 which showed LV function improved to normal 56%, mild RV dysfunction EF 45%, asymmetric LVH measuring 14 mm.  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  He was last seen by Dr. Gardiner Rhyme 12/29/2019.  During that time he reported he was doing well.  He had started Entresto 2 weeks prior.  He was monitoring his blood pressure which was averaging in the 120s over 70s.  He denied chest pain.  He reported dyspnea with minimal exertion.  He did note intermittent lightheadedness with bending over but denied syncope.  He denied palpitations.  He presents the clinic today for follow-up evaluation and states  he feels well.  He continues to have some shortness of breath which he attributes to his COPD.  He follows with pulmonology.  We reviewed his prior cardiac MRI and he expressed understanding.  Him and his wife have purchased new bikes which are IT trainer.  They have enjoyed riding around.  He eats a well-balanced diet and reports that he is eating a calorie reduced diet.  He feels that his activity tolerance is the same as it was a year ago and his breathing has  remained stable as well.    He is now on a inhaler which was prescribed by pulmonology.  He continues to use 3 L of oxygen at night with sleep. He reports that he recently had a brain MRI which showed no cancer.  Him and his wife are planning to travel back to [place] January through March.  I will give him the salty 6 diet sheet, have him increase his physical activity as tolerated, order a BMP, and plan follow-up for 1 year.  Today he denies chest pain, increased shortness of breath, lower extremity edema, fatigue, palpitations, melena, hematuria, hemoptysis, diaphoresis, weakness, presyncope, syncope, orthopnea, and PND.    Home Medications    Prior to Admission medications   Medication Sig Start Date End Date Taking? Authorizing Provider  allopurinol (ZYLOPRIM) 300 MG tablet Take 300 mg by mouth daily.    [provider]  b complex vitamins tablet Take 1 tablet by mouth daily.    [provider]  blood glucose meter kit and supplies KIT Dispense based on patient and insurance preference. Use up to four times daily as directed. (FOR ICD-9 250.00, 250.01). 12/13/17   Raiford Noble Latif, DO  Insulin Isophane & Regular Human (NOVOLIN 70/30 FLEXPEN) (70-30) 100 UNIT/ML PEN Inject 15 Units into the skin 2 (two) times daily. 01/07/18   Raiford Noble Latif, DO  Insulin Pen Needle (PEN NEEDLES 3/16") 31G X 5 MM MISC 1 Container by Does not apply route 2 (two) times daily. 01/07/18   Sheikh, [name], DO  levETIRAcetam (KEPPRA) 500 MG tablet TAKE 1 TABLET BY MOUTH TWICE A DAY 09/24/20   Vaslow, [name], MD  metoprolol succinate (TOPROL-XL) 50 MG 24 hr tablet TAKE 1 TABLET BY MOUTH DAILY 08/19/20   Donato Heinz, MD  Multiple Vitamin (MULTIVITAMIN) tablet Take 1 tablet by mouth daily.    [provider]  [facility] VERIO test strip 1 each daily. 12/25/19   [provider]  sacubitril-valsartan (ENTRESTO) 49-51 MG Take 1 tablet by mouth 2 (two) times daily.  07/22/20   Jettie Booze, MD  spironolactone (ALDACTONE) 25 MG tablet TAKE 1/2 TABLET BY MOUTH DAILY 08/19/20   Donato Heinz, MD  Tiotropium Bromide-Olodaterol (STIOLTO RESPIMAT) 2.5-2.5 MCG/ACT AERS Inhale into the lungs. 09/08/19   [provider]    Family History    Family History  Problem Relation Age of Onset   Cancer Brother    He indicated that his mother is deceased. He indicated that his father is deceased. He indicated that his sister is alive. He indicated that his brother is deceased.  Social History    Social History   Socioeconomic History   Marital status: Significant Other    Spouse name: [name]   Number of children: Not on file   Years of education: 16   Highest education level: Bachelor's degree (e.g., BA, AB, BS)  Occupational History   Occupation: retired    Fish farm manager: [employer]  Comment: disability  Tobacco Use   Smoking status: Former    Packs/day: 2.00    Years: 40.00    Pack years: 80.00    Types: Cigarettes    Quit date: 08/10/2011    Years since quitting: 9.6   Smokeless tobacco: Never  Vaping Use   Vaping Use: Former   Quit date: 05/12/2011  Substance and Sexual Activity   Alcohol use: Yes    Alcohol/week: 14.0 standard drinks    Types: 14 Cans of beer per week   Drug use: Never   Sexual activity: Not Currently  Other Topics Concern   Not on file  Social History Narrative   Not on file   Social Determinants of Health   Financial Resource Strain: Not on file  Food Insecurity: Not on file  Transportation Needs: Not on file  Physical Activity: Not on file  Stress: Not on file  Social Connections: Not on file  Intimate Partner Violence: Not on file     Review of Systems    General:  No chills, fever, night sweats or weight changes.  Cardiovascular:  No chest pain, dyspnea on exertion, edema, orthopnea, palpitations, paroxysmal nocturnal dyspnea. Dermatological: No rash, lesions/masses Respiratory: No cough,  dyspnea Urologic: No hematuria, dysuria Abdominal:   No nausea, vomiting, diarrhea, bright red blood per rectum, melena, or hematemesis Neurologic:  No visual changes, wkns, changes in mental status. All other systems reviewed and are otherwise negative except as noted above.  Physical Exam    VS:  BP 126/64   Pulse 65   Ht '6\' 4"'  (1.93 m)   Wt 288 lb 9.6 oz (130.9 kg)   SpO2 94%   BMI 35.13 kg/m  , BMI Body mass index is 35.13 kg/m. GEN: Well nourished, well developed, in no acute distress. HEENT: normal. Neck: Supple, no JVD, carotid bruits, or masses. Cardiac: RRR, no murmurs, rubs, or gallops. No clubbing, cyanosis, edema.  Radials/DP/PT 2+ and equal bilaterally.  Respiratory:  Respirations regular and unlabored, clear to auscultation bilaterally. GI: Soft, nontender, nondistended, BS + x 4. MS: no deformity or atrophy. Skin: warm and dry, no rash. Neuro:  Strength and sensation are intact. Psych: Normal affect.  Accessory Clinical Findings    Recent Labs: No results found for requested labs within last 8760 hours.   Recent Lipid Panel    Component Value Date/Time   CHOL 198 10/02/2019 1147   TRIG 364 (H) 10/02/2019 1147   HDL 39 (L) 10/02/2019 1147   CHOLHDL 5.1 (H) 10/02/2019 1147   LDLCALC 98 10/02/2019 1147    ECG personally reviewed by me today-normal sinus rhythm left axis deviation right bundle branch block 65 bpm- No acute changes  Echocardiogram 09/22/2019 IMPRESSIONS     1. Left ventricular ejection fraction, by estimation, is 40 to 45%. The  left ventricle has mild to moderately decreased function. The left  ventricle demonstrates global hypokinesis. The left ventricular internal  cavity size was moderately dilated. Left  ventricular diastolic parameters are consistent with Grade I diastolic  dysfunction (impaired relaxation).   2. Right ventricular systolic function is normal. The right ventricular  size is normal. Tricuspid regurgitation signal is  inadequate for assessing  PA pressure.   3. Left atrial size was mildly dilated.   4. Right atrial size was mildly dilated.   5. The mitral valve is normal in structure. Trivial mitral valve  regurgitation.   6. The aortic valve is grossly normal. Aortic valve regurgitation is not  visualized.  Comparison(s): Prior images reviewed side by side. Changes from prior  study are noted. The left ventricular function is worsened. The left  ventricle appears mildly dilated on the images from 2019 (parasternal  images could not be acquired and measurements   were not performed).  Assessment & Plan   1.  Combined systolic and diastolic CHF-stable dyspnea with exertion.  Euvolemic today.  Cardiac MRI 8/21 showed LVEF improved to 56%, mild RV dysfunction, asymmetric LVH with basal inferior lateral mid wall LGE.  He was felt to possibly have prior myocarditis versus Fabry's disease due to differential LA GE location with LVH and relative low native T1 values.  Previous plan for evaluation of alpha galactosidase to further evaluate for Fabrys. Continue Entresto, metoprolol, spironolactone Heart healthy low-sodium diet Increase physical activity as tolerated I will reach out to [place] for review of alpha galactosidae BMP  Essential hypertension-BP today 126/64.  Well-controlled at home. Continue metoprolol, spironolactone Heart healthy low-sodium diet-salty 6 given Increase physical activity as tolerated  Hyperlipidemia-LDL 98 on 10/02/2019.  Goal LDL less than 70. Continue rosuvastatin Heart healthy low-sodium diet-salty 6 given Increase physical activity as tolerated Follows with PCP  Type 2 diabetes-glucose 123 on 12/29/2019. Are healthy low-sodium carb modified diet Increase physical activity as tolerated Follows with PCP  Disposition: Follow-up with Dr.Schumann in 6-9 months.  Jossie Ng. Mykah Shin NP-C    04/16/2021, 3:50 PM [facility] [address] Office [phone] Fax [phone]  Notice: This dictation was prepared with Dragon dictation along with smaller phrase technology. Any transcriptional errors that result from this process are unintentional and may not be corrected upon review.  I spent 14 minutes examining this patient, reviewing medications, and using patient centered shared decision making involving her cardiac care.  Prior to her visit I spent greater than 20 minutes reviewing her past medical history,  medications, and prior cardiac tests.

## 2021-04-16 ENCOUNTER — Other Ambulatory Visit: Payer: Self-pay

## 2021-04-16 ENCOUNTER — Encounter: Payer: Self-pay | Admitting: General Practice

## 2021-04-16 ENCOUNTER — Ambulatory Visit: Payer: PPO | Admitting: General Practice

## 2021-04-16 VITALS — BP 126/64 | HR 65 | Ht 76.0 in | Wt 288.6 lb

## 2021-04-16 DIAGNOSIS — Z79899 Other long term (current) drug therapy: Secondary | ICD-10-CM

## 2021-04-16 DIAGNOSIS — E785 Hyperlipidemia, unspecified: Secondary | ICD-10-CM | POA: Diagnosis not present

## 2021-04-16 DIAGNOSIS — I5042 Chronic combined systolic (congestive) and diastolic (congestive) heart failure: Secondary | ICD-10-CM | POA: Diagnosis not present

## 2021-04-16 DIAGNOSIS — I10 Essential (primary) hypertension: Secondary | ICD-10-CM | POA: Diagnosis not present

## 2021-04-16 NOTE — Patient Instructions (Signed)
Medication Instructions:  The current medical regimen is effective;  continue present plan and medications as directed. Please refer to the Current Medication list given to you today.   *If you need a refill on your cardiac medications before your next appointment, please call your pharmacy*  Lab Work:   Testing/Procedures:  BMET TODAY   NONE  Special Instructions PLEASE READ AND FOLLOW SALTY 6-ATTACHED-1,800mg  daily  PLEASE INCREASE PHYSICAL ACTIVITY AS TOLERATED   Follow-Up: Your next appointment:  12 month(s) In Person with Donato Heinz, MD   Please call our office 2 months in advance to schedule this appointment   At [facility], you and your health needs are our priority.  As part of our continuing mission to provide you with exceptional heart care, we have created designated Provider Care Teams.  These Care Teams include your primary Cardiologist (physician) and Advanced Practice Providers (APPs -  Physician Assistants and Nurse Practitioners) who all work together to provide you with the care you need, when you need it.            6 SALTY THINGS TO AVOID     1,800MG  DAILY

## 2021-04-17 ENCOUNTER — Encounter: Payer: Self-pay | Admitting: Internal Medicine

## 2021-04-17 LAB — BASIC METABOLIC PANEL
BUN/Creatinine Ratio: 12 (ref 10–24)
BUN: 13 mg/dL (ref 8–27)
CO2: 27 mmol/L (ref 20–29)
Calcium: 9.1 mg/dL (ref 8.6–10.2)
Chloride: 101 mmol/L (ref 96–106)
Creatinine, Ser: 1.09 mg/dL (ref 0.76–1.27)
Glucose: 111 mg/dL — ABNORMAL HIGH (ref 70–99)
Potassium: 5.1 mmol/L (ref 3.5–5.2)
Sodium: 141 mmol/L (ref 134–144)
eGFR: 76 mL/min/{1.73_m2} (ref >59)

## 2021-04-21 ENCOUNTER — Telehealth: Payer: Self-pay

## 2021-04-21 DIAGNOSIS — Z131 Encounter for screening for diabetes mellitus: Secondary | ICD-10-CM | POA: Diagnosis not present

## 2021-04-21 DIAGNOSIS — I5042 Chronic combined systolic (congestive) and diastolic (congestive) heart failure: Secondary | ICD-10-CM

## 2021-04-21 DIAGNOSIS — I517 Cardiomegaly: Secondary | ICD-10-CM | POA: Diagnosis not present

## 2021-04-21 NOTE — Telephone Encounter (Signed)
-----   Message from Loel Dubonnet, NP sent at 04/21/2021  8:35 AM EST ----- Delight Stare,  I don't see that this gentleman ever had his alpha galactosidase drawn. Can we just follow up with him regarding that?  Thanks! Loel Dubonnet, NP  ----- Message ----- From: Donato Heinz, MD Sent: 04/20/2021   4:25 PM EST To: Deberah Pelton, NP  Did he ever get the alpha galactosidase lab drawn? Gerald Stabs  ----- Message ----- From: Deberah Pelton, NP Sent: 04/16/2021   3:59 PM EST To: Donato Heinz, MD   Previous plan for evaluation of alpha galactosidase to further evaluate for [facility].  He remains stable at this time.  He reports that he just had a MRI of his brain which remains clear from his prior brain CA.  Did you have plans for further testing?

## 2021-04-21 NOTE — Telephone Encounter (Signed)
Left detailed message to come in fasting for specialized testing requested by Dr Gardiner Rhyme more than a year ago

## 2021-04-21 NOTE — Telephone Encounter (Signed)
Pt here for lab draw-new order entered for alpha galactosidase.

## 2021-04-22 NOTE — Progress Notes (Signed)
Pt came in [place] had to send to [place]

## 2021-04-24 LAB — ALPHA GALACTOSIDASE: Alpha-Galactosidase activity: 97.3 nmol/hr/mg prt (ref >35.5)

## 2021-04-30 ENCOUNTER — Encounter: Payer: Self-pay | Admitting: *Deleted

## 2021-05-05 ENCOUNTER — Other Ambulatory Visit: Payer: Self-pay | Admitting: Cardiology

## 2021-05-27 ENCOUNTER — Other Ambulatory Visit: Payer: Self-pay

## 2021-05-27 MED ORDER — SACUBITRIL-VALSARTAN 49-51 MG PO TABS: 1.0000 | ORAL_TABLET | Freq: Two times a day (BID) | ORAL | 3 refills | Status: DC

## 2021-08-18 ENCOUNTER — Telehealth: Payer: Self-pay | Admitting: Internal Medicine

## 2021-08-18 NOTE — Telephone Encounter (Signed)
Per 4/10 spoke to pt.  Pt confirmed appointment.  ?

## 2021-08-27 ENCOUNTER — Other Ambulatory Visit: Payer: Self-pay | Admitting: Radiation Therapy

## 2021-10-09 ENCOUNTER — Ambulatory Visit
Admission: RE | Admit: 2021-10-09 | Discharge: 2021-10-09 | Disposition: A | Discharge status: Home / Self Care | Payer: PPO | Source: Ambulatory Visit | Attending: Internal Medicine | Admitting: Internal Medicine

## 2021-10-09 DIAGNOSIS — C8589 Other specified types of non-Hodgkin lymphoma, extranodal and solid organ sites: Secondary | ICD-10-CM

## 2021-10-09 MED ORDER — GADOBENATE DIMEGLUMINE 529 MG/ML IV SOLN
20.0000 mL | Freq: Once | INTRAVENOUS | Status: AC | PRN
  Administered 2021-10-09: 20 mL via INTRAVENOUS

## 2021-10-13 ENCOUNTER — Inpatient Hospital Stay: Payer: PPO | Attending: Internal Medicine | Admitting: Internal Medicine

## 2021-10-13 ENCOUNTER — Inpatient Hospital Stay: Payer: PPO

## 2021-10-13 ENCOUNTER — Other Ambulatory Visit: Payer: Self-pay

## 2021-10-13 VITALS — BP 130/64 | HR 74 | Temp 97.0°F | Resp 18 | Wt 299.6 lb

## 2021-10-13 DIAGNOSIS — C8589 Other specified types of non-Hodgkin lymphoma, extranodal and solid organ sites: Secondary | ICD-10-CM | POA: Diagnosis not present

## 2021-10-13 NOTE — Progress Notes (Signed)
Tim [name] at [name], [name] [id number] [phone]   Interval Evaluation  Date of Service: 10/13/21 Patient Name: Tim [name] Patient MRN: [id number] Patient DOB: 1955-11-18 Provider: Ventura Sellers, MD  Identifying Statement:  Tim [name] is a 66 y.o. male with left frontal  CNS lymphoma  Treatment Summary: 08/30/17: L frontal stereotactic biopsy with Dr. Vertell Limber 01/31/18: Completes 3rd cycle of HD-MTX and Rituximab 07/26/18: Completes 5th cycle of 5-day Temodar consolidation  Interval History:  Tim [name] presents today for follow up after recent MRI brain. No new or progressive changes.  He is now following with vitreal specialist at [facility], no intervention recommended at this time. Otherwise denies progressive neurologic complaints, no language issues.  No seizures.  Medications: Current Outpatient Medications on File Prior to Visit  Medication Sig Dispense Refill   allopurinol (ZYLOPRIM) 300 MG tablet Take 300 mg by mouth daily.     b complex vitamins tablet Take 1 tablet by mouth daily.     blood glucose meter kit and supplies KIT Dispense based on patient and insurance preference. Use up to four times daily as directed. (FOR ICD-9 250.00, 250.01). 1 each 0   Insulin Isophane & Regular Human (NOVOLIN 70/30 FLEXPEN) (70-30) 100 UNIT/ML PEN Inject 15 Units into the skin 2 (two) times daily. 15 mL 11   Insulin Pen Needle (PEN NEEDLES 3/16") 31G X 5 MM MISC 1 Container by Does not apply route 2 (two) times daily. 100 each 0   metoprolol succinate (TOPROL-XL) 50 MG 24 hr tablet TAKE 1 TABLET BY MOUTH DAILY 90 tablet 2   Multiple Vitamin (MULTIVITAMIN) tablet Take 1 tablet by mouth daily.     ONETOUCH VERIO test strip 1 each daily.     sacubitril-valsartan (ENTRESTO) 49-51 MG Take 1 tablet by mouth 2 (two) times daily. 180 tablet 3   spironolactone (ALDACTONE) 25 MG tablet TAKE 1/2 TABLET BY MOUTH DAILY  45 tablet 5   Tiotropium Bromide-Olodaterol (STIOLTO RESPIMAT) 2.5-2.5 MCG/ACT AERS Inhale into the lungs.     Current Facility-Administered Medications on File Prior to Visit  Medication Dose Route Frequency Provider Last Rate Last Admin   heparin lock flush 100 unit/mL  500 Units Intracatheter Once [place] Schmale, [name], MD       sodium chloride flush (NS) 0.9 % injection 10 mL  10 mL Intracatheter Once Ventura Sellers, MD        Allergies: No Known Allergies   Past Medical History:  Past Medical History:  Diagnosis Date   Arthritis    Brain tumor ([place])    COPD (chronic obstructive pulmonary disease) ([place])    Diabetes mellitus without complication ([place])    patient states he was taken off meds at last appointment with PCP and is diet controlled   Dyspnea    Ejection fraction < 50% 2021   Gout    Hypertension 08/02/2017   Pneumonia    Past Surgical History: none    Social History:  Social History   Socioeconomic History   Marital status: Significant Other    Spouse name: Tim [name]   Number of children: Not on file   Years of education: 16   Highest education level: Bachelor's degree (e.g., BA, AB, BS)  Occupational History   Occupation: retired    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  Tobacco Use   Smoking status: Former    Packs/day: 2.00    Years: 40.00  Pack years: 80.00    Types: Cigarettes    Quit date: 08/10/2011    Years since quitting: 10.1   Smokeless tobacco: Never  Vaping Use   Vaping Use: Former   Quit date: 05/12/2011  Substance and Sexual Activity   Alcohol use: Yes    Alcohol/week: 14.0 standard drinks    Types: 14 Cans of beer per week   Drug use: Never   Sexual activity: Not Currently  Other Topics Concern   Not on file  Social History Narrative   Not on file   Social Determinants of Health   Financial Resource Strain: Not on file  Food Insecurity: Not on file  Transportation Needs: Not on file  Physical Activity: Not on file  Stress: Not  on file  Social Connections: Not on file  Intimate Partner Violence: Not on file   Family History:  Family History  Problem Relation Age of Onset   Cancer Brother     Review of Systems: Constitutional: Denies fevers, chills or abnormal weight loss Eyes: Denies blurriness of vision Ears, nose, mouth, throat, and face: Denies mucositis or sore throat Respiratory: denies sob Cardiovascular: Denies palpitation, chest discomfort or lower extremity swelling Gastrointestinal:  Denies nausea, constipation, diarrhea GU: Denies dysuria or incontinence Skin: Denies abnormal skin rashes Neurological: Per HPI Musculoskeletal: Denies joint pain, back or neck discomfort. No decrease in ROM Behavioral/Psych: Denies anxiety, disturbance in thought content, and mood instability  Physical Exam: Vitals:   10/13/21 1220 10/13/21 1225  BP: 130/64   Pulse: 74   Resp: 18   Temp: (!) 97 F (36.1 C)   SpO2: (!) 89% 91%    KPS: 80. General: Alert, cooperative, pleasant, in no acute distress Head: Biopsy scar noted, dry and intact. EENT: No conjunctival injection or scleral icterus. Oral mucosa moist Lungs: Resp effort normal Cardiac: Regular rate and rhythm Abdomen: Soft, non-distended abdomen Skin: No rashes cyanosis or petechiae. Extremities: No clubbing or edema  Neurologic Exam: Mental Status: Awake, alert, attentive to examiner. Oriented to self and environment. Language fluent with intact comprehension, repetition, reading. Cranial Nerves: Visual acuity is grossly normal. Visual fields are full. Extra-ocular movements intact. No ptosis. Face is symmetric, tongue midline. Motor: Tone and bulk are normal. Full power in arms and legs. Reflexes are symmetric, no pathologic reflexes present. Intact finger to nose bilaterally Sensory: Intact to light touch and temperature Gait: Normal, independent   Labs: I have reviewed the data as listed    Component Value Date/Time   NA 141 04/16/2021  1604   K 5.1 04/16/2021 1604   CL 101 04/16/2021 1604   CO2 27 04/16/2021 1604   GLUCOSE 111 (H) 04/16/2021 1604   GLUCOSE 191 (H) 12/20/2018 0929   BUN 13 04/16/2021 1604   CREATININE 1.09 04/16/2021 1604   CREATININE 0.97 12/20/2018 0929   CALCIUM 9.1 04/16/2021 1604   PROT 6.9 12/29/2019 1619   ALBUMIN 4.3 12/29/2019 1619   AST 44 (H) 12/29/2019 1619   AST 50 (H) 12/20/2018 0929   ALT 49 (H) 12/29/2019 1619   ALT 59 (H) 12/20/2018 0929   ALKPHOS 97 12/29/2019 1619   BILITOT 0.7 12/29/2019 1619   BILITOT 0.5 12/20/2018 0929   GFRNONAA 93 12/29/2019 1619   GFRNONAA >60 12/20/2018 0929   GFRAA 107 12/29/2019 1619   GFRAA >60 12/20/2018 0929   Lab Results  Component Value Date   WBC 6.2 11/14/2019   NEUTROABS 3.6 11/14/2019   HGB 16.9 11/14/2019   HCT  51.5 11/14/2019   MCV 98.3 11/14/2019   PLT 139 (L) 11/14/2019    Imaging:  [place] Clinician Interpretation: I have personally reviewed the MRI images from 09/25/20.  My interpretation, in the context of the patient's clinical presentation, is stable disease  Pending official radiology interpretation MR BRAIN W WO CONTRAST  Result Date: 10/10/2021 CLINICAL DATA:  CNS neoplasm, assess treatment response. History of primary CNS lymphoma EXAM: MRI HEAD WITHOUT AND WITH CONTRAST TECHNIQUE: Multiplanar, multiecho pulse sequences of the brain and surrounding structures were obtained without and with intravenous contrast. CONTRAST:  28m MULTIHANCE GADOBENATE DIMEGLUMINE 529 MG/ML IV SOLN COMPARISON:  03/27/2021 FINDINGS: Brain: Stable patchy gliotic/encephalomalacia appearance in the left cerebral hemisphere without enhancement or swelling to suggest recurrent disease. No acute or interval infarct. No hemorrhage, hydrocephalus, or collection. Vascular: Major flow voids and vascular enhancements are preserved. Skull and upper cervical spine: Normal marrow signal. Biopsy burr hole on the left. Prominent degenerative facet spurring on the  left at C3-4. Sinuses/Orbits: Negative IMPRESSION: No evidence of recurrent disease. Electronically Signed   By: JJorje GuildM.D.   On: 10/10/2021 11:50     Assessment/Plan 1. Primary CNS lymphoma ([facility]  Mr. SBignellis clinically and radiographically stable today.  No changes recommended today.  He will con't [facility]  CRoyal Hawthornshould return to clinic in 9 months following next brain MRI, or sooner as needed.  We appreciate the opportunity to participate in the care of CATTILIO ZEITLER   I have spent a total of 30 minutes of face-to-face and non-face-to-face time, excluding clinical staff time, preparing to see patient, ordering tests and/or medications, counseling the patient, and independently interpreting results and communicating results to the patient/family/caregiver    ZVentura Sellers MD Medical Director of Neuro-Oncology [facility] WMarianna06/05/23 12:19 PM

## 2021-10-27 ENCOUNTER — Other Ambulatory Visit: Payer: Self-pay | Admitting: Cardiology

## 2021-12-01 ENCOUNTER — Other Ambulatory Visit: Payer: Self-pay | Admitting: Oncology

## 2021-12-01 ENCOUNTER — Other Ambulatory Visit: Payer: Self-pay

## 2022-01-26 ENCOUNTER — Other Ambulatory Visit: Payer: Self-pay | Admitting: Cardiology

## 2022-03-27 ENCOUNTER — Telehealth: Payer: Self-pay | Admitting: Pharmacist

## 2022-03-27 NOTE — Telephone Encounter (Signed)
Provider section of Entresto patient assistance application faxed to [employer]

## 2022-04-21 ENCOUNTER — Other Ambulatory Visit: Payer: Self-pay | Admitting: Cardiology

## 2022-04-26 NOTE — Progress Notes (Unsigned)
Cardiology Clinic Note   Patient Name: Tim [name] Date of Encounter: 04/27/2022  Primary Care Provider:  Bartholome Bill, MD Primary Cardiologist:  Donato Heinz, MD  Patient Profile    Tim [name] 66 year old male presents to the clinic today for follow-up evaluation of his essential hypertension and chronic combined systolic and diastolic CHF.  Past Medical History    Past Medical History:  Diagnosis Date   Arthritis    Brain tumor [facility])    COPD (chronic obstructive pulmonary disease) ([place])    Diabetes mellitus without complication ([place])    patient states he was taken off meds at last appointment with PCP and is diet controlled   Dyspnea    Ejection fraction < 50% 2021   Gout    Hypertension 08/02/2017   Pneumonia    Past Surgical History:  Procedure Laterality Date   APPLICATION OF CRANIAL NAVIGATION Left 08/30/2017   Procedure: APPLICATION OF CRANIAL NAVIGATION;  Surgeon: Erline Levine, MD;  Location: [facility];  Service: Neurosurgery;  Laterality: Left;   IR IMAGING GUIDED PORT INSERTION  12/27/2017   IR REMOVAL TUN ACCESS W/ PORT W/O FL MOD SED  11/14/2019   STERIOTACTIC STIMULATOR INSERTION Left 08/30/2017   Procedure: LEFT Frontal Sterotactic Brain Biopsy with BrainLab;  Surgeon: Erline Levine, MD;  Location: [place];  Service: Neurosurgery;  Laterality: Left;    Allergies  No Known Allergies  History of Present Illness    Tim [name] has a PMH of essential hypertension, chronic combined systolic and diastolic CHF, COPD, diabetes, dyspnea on exertion, and bilateral lower extremity swelling.  His PMH also includes CNS lymphoma.  He was referred by Dr. Luciana Axe for evaluation of his dyspnea on exertion.  He was initially seen on 09/04/2019.  He reported that he would get short of breath with minimal exertion.  1 example was shortness of breath with walking to the bathroom.  He reported having to rest while walking in the grocery store.  He  denied chest pain.  He reported statin intolerance.  He did not exercise.  His lymphoma was treated with chemotherapy and he reported no recurrence of disease.  He smoked at least 2 packs/day for 40 years and quit 9 years ago.  There is no family history of heart disease.  His echocardiogram 8/19 showed normal systolic function, mild left atrial dilation, mild mitral regurgitation.  His echocardiogram 09/22/2019 showed an EF of 40-45%, global hypokinesis, G1 DD, normal RV function and no significant valvular disease.  His coronary calcium score 5/21 showed a score of 8 which placed him in the 31st percentile.  His coronary CTA 10/27/2019 showed nonobstructive CAD with calcified plaque in his left main and mid RCA 0-24% stenosis.  He had cardiac MRI 12/26/2019 which showed LV function improved to normal 56%, mild RV dysfunction EF 45%, asymmetric LVH measuring 14 mm.  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  He was last seen by Dr. Gardiner Rhyme 12/29/2019.  During that time he reported he was doing well.  He had started Entresto 2 weeks prior.  He was monitoring his blood pressure which was averaging in the 120s over 70s.  He denied chest pain.  He reported dyspnea with minimal exertion.  He did note intermittent lightheadedness with bending over but denied syncope.  He denied palpitations.  He presented to the clinic 04/16/21 for follow-up evaluation and  stated he felt well.  He continued to have some shortness of breath which he attributed to his COPD.  He followed with pulmonology.  We reviewed his prior cardiac MRI and he expressed understanding.  Him and his wife hadpurchased new bikes which are electric.  They have enjoyed riding around.  He was on a inhaler which was prescribed by pulmonology.  He continued with 3 L of oxygen at night with sleep. He reported that he  had a brain MRI which showed no cancer.  Him  and his wife were planning to travel back to [place] January through March.    He presents to the clinic today for follow-up evaluation and states his breathing is somewhat worse.  He continues to follow with pulmonology.  We reviewed his medications and he expressed understanding.  He reports that he is fairly sedentary.  He and his wife plan to travel to [place] after the first the year.  I encouraged him to increase his physical fitness and use incentive spirometry.  He expressed understanding.  We reviewed his previous lab work.  I will order a BMP, continue his current medication regimen, and plan follow-up in 12 months.  Today he denies chest pain, increased shortness of breath, lower extremity edema, fatigue, palpitations, melena, hematuria, hemoptysis, diaphoresis, weakness, presyncope, syncope, orthopnea, and PND.    Home Medications    Prior to Admission medications   Medication Sig Start Date End Date Taking? Authorizing Provider  allopurinol (ZYLOPRIM) 300 MG tablet Take 300 mg by mouth daily.    [provider]  b complex vitamins tablet Take 1 tablet by mouth daily.    [provider]  blood glucose meter kit and supplies KIT Dispense based on patient and insurance preference. Use up to four times daily as directed. (FOR ICD-9 250.00, 250.01). 12/13/17   Raiford Noble Latif, DO  Insulin Isophane & Regular Human (NOVOLIN 70/30 FLEXPEN) (70-30) 100 UNIT/ML PEN Inject 15 Units into the skin 2 (two) times daily. 01/07/18   Raiford Noble Latif, DO  Insulin Pen Needle (PEN NEEDLES 3/16") 31G X 5 MM MISC 1 Container by Does not apply route 2 (two) times daily. 01/07/18   Sheikh, [name], DO  levETIRAcetam (KEPPRA) 500 MG tablet TAKE 1 TABLET BY MOUTH TWICE A DAY 09/24/20   Vaslow, [name], MD  metoprolol succinate (TOPROL-XL) 50 MG 24 hr tablet TAKE 1 TABLET BY MOUTH DAILY 08/19/20   Donato Heinz, MD  Multiple Vitamin (MULTIVITAMIN) tablet Take 1 tablet by mouth  daily.    [provider]  [facility] VERIO test strip 1 each daily. 12/25/19   [provider]  sacubitril-valsartan (ENTRESTO) 49-51 MG Take 1 tablet by mouth 2 (two) times daily. 07/22/20   Jettie Booze, MD  spironolactone (ALDACTONE) 25 MG tablet TAKE 1/2 TABLET BY MOUTH DAILY 08/19/20   Donato Heinz, MD  Tiotropium Bromide-Olodaterol (STIOLTO RESPIMAT) 2.5-2.5 MCG/ACT AERS Inhale into the lungs. 09/08/19   [provider]    Family History    Family History  Problem Relation Age of Onset   Cancer Brother    He indicated that his mother is deceased. He indicated that his father is deceased. He indicated that his sister is alive. He indicated that his brother is deceased.   Social History    Social History   Socioeconomic History   Marital status: Significant Other    Spouse name: [name]   Number of children: Not on file   Years  of education: 16   Highest education level: Bachelor's degree (e.g., BA, AB, BS)  Occupational History   Occupation: retired    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  Tobacco Use   Smoking status: Former    Packs/day: 2.00    Years: 40.00    Total pack years: 80.00    Types: Cigarettes    Quit date: 08/10/2011    Years since quitting: 10.7   Smokeless tobacco: Never  Vaping Use   Vaping Use: Former   Quit date: 05/12/2011  Substance and Sexual Activity   Alcohol use: Yes    Alcohol/week: 14.0 standard drinks of alcohol    Types: 14 Cans of beer per week   Drug use: Never   Sexual activity: Not Currently  Other Topics Concern   Not on file  Social History Narrative   Not on file   Social Determinants of Health   Financial Resource Strain: Low Risk  (08/30/2017)   Overall Financial Resource Strain (CARDIA)    Difficulty of Paying Living Expenses: Not hard at all  Food Insecurity: No Food Insecurity (08/30/2017)   Hunger Vital Sign    Worried About Running Out of Food in the Last Year: Never true     Ran Out of Food in the Last Year: Never true  Transportation Needs: No Transportation Needs (08/30/2017)   PRAPARE - Hydrologist (Medical): No    Lack of Transportation (Non-Medical): No  Physical Activity: Inactive (08/30/2017)   Exercise Vital Sign    Days of Exercise per Week: 0 days    Minutes of Exercise per Session: 0 min  Stress: No Stress Concern Present (08/30/2017)   [place]    Feeling of Stress : Only a little  Social Connections: Moderately Isolated (08/30/2017)   Social Connection and Isolation Panel [NHANES]    Frequency of Communication with Friends and Family: Once a week    Frequency of Social Gatherings with Friends and Family: Once a week    Attends Religious Services: Never    Marine scientist or Organizations: No    Attends Archivist Meetings: Never    Marital Status: Living with partner  Intimate Partner Violence: Not At Risk (08/30/2017)   Humiliation, Afraid, Rape, and Kick questionnaire    Fear of Current or Ex-Partner: No    Emotionally Abused: No    Physically Abused: No    Sexually Abused: No     Review of Systems    General:  No chills, fever, night sweats or weight changes.  Cardiovascular:  No chest pain, dyspnea on exertion, edema, orthopnea, palpitations, paroxysmal nocturnal dyspnea. Dermatological: No rash, lesions/masses Respiratory: No cough, dyspnea Urologic: No hematuria, dysuria Abdominal:   No nausea, vomiting, diarrhea, bright red blood per rectum, melena, or hematemesis Neurologic:  No visual changes, wkns, changes in mental status. All other systems reviewed and are otherwise negative except as noted above.  Physical Exam    VS:  BP 128/66 (BP Location: Left Arm, Patient Position: Sitting, Cuff Size: Large)   Pulse 60   Ht _0  (1.93 m)   Wt (!) 304 lb 9.6 oz (138.2 kg)   BMI 37.08 kg/m  , BMI Body mass index is 37.08  kg/m. GEN: Well nourished, well developed, in no acute distress. HEENT: normal. Neck: Supple, no JVD, carotid bruits, or masses. Cardiac: RRR, no murmurs, rubs, or gallops. No clubbing, cyanosis, edema.  Radials/DP/PT 2+ and equal bilaterally.  Respiratory:  Respirations regular and unlabored, clear to auscultation bilaterally. GI: Soft, nontender, nondistended, BS + x 4. MS: no deformity or atrophy. Skin: warm and dry, no rash. Neuro:  Strength and sensation are intact. Psych: Normal affect.  Accessory Clinical Findings    Recent Labs: No results found for requested labs within last 365 days.   Recent Lipid Panel    Component Value Date/Time   CHOL 198 10/02/2019 1147   TRIG 364 (H) 10/02/2019 1147   HDL 39 (L) 10/02/2019 1147   CHOLHDL 5.1 (H) 10/02/2019 1147   LDLCALC 98 10/02/2019 1147    ECG personally reviewed by me today-EKG today shows normal sinus rhythm right bundle branch block 60 bpm  EKG 04/16/2021 normal sinus rhythm left axis deviation right bundle branch block 65 bpm- No acute changes  Echocardiogram 09/22/2019 IMPRESSIONS     1. Left ventricular ejection fraction, by estimation, is 40 to 45%. The  left ventricle has mild to moderately decreased function. The left  ventricle demonstrates global hypokinesis. The left ventricular internal  cavity size was moderately dilated. Left  ventricular diastolic parameters are consistent with Grade I diastolic  dysfunction (impaired relaxation).   2. Right ventricular systolic function is normal. The right ventricular  size is normal. Tricuspid regurgitation signal is inadequate for assessing  PA pressure.   3. Left atrial size was mildly dilated.   4. Right atrial size was mildly dilated.   5. The mitral valve is normal in structure. Trivial mitral valve  regurgitation.   6. The aortic valve is grossly normal. Aortic valve regurgitation is not  visualized.   Comparison(s): Prior images reviewed side by side.  Changes from prior  study are noted. The left ventricular function is worsened. The left  ventricle appears mildly dilated on the images from 2019 (parasternal  images could not be acquired and measurements   were not performed).  Assessment & Plan   1.  Combined systolic and diastolic CHF-no increased DOE.  Weight stable.    Euvolemic. Cardiac MRI 8/21 showed LVEF improved to 56%, mild RV dysfunction, asymmetric LVH with basal inferior lateral mid wall LGE.  He was felt to possibly have prior myocarditis versus Fabry's disease due to differential LA GE location with LVH and relative low native T1 values.  Previous plan for evaluation of alpha galactosidase to further evaluate for Fabrys. Continue Entresto, metoprolol, spironolactone Heart healthy low-sodium diet-reviewed Increase physical activity as tolerated  Dr.Schumann to review of alpha galactosidae and advise on further testing BMP  Essential hypertension-BP today 128/66.  Continues to be well-controlled.   Continue metoprolol, spironolactone Heart healthy low-sodium diet  Hyperlipidemia-LDL 98 on 10/02/2019.  Goal LDL less than 70. Continue rosuvastatin Heart healthy low-sodium diet-salty 6 given Increase physical activity as tolerated Follows with PCP  Type 2 diabetes-glucose 111 on 04/16/21 Are healthy low-sodium carb modified diet Increase physical activity as tolerated Follows with PCP  COPD-encouraged increased physical activity and incentive spirometer Follows with pulmonology  Disposition: Follow-up with Dr.Schumann in 12 months.  Jossie Ng. Anusha Claus NP-C    04/27/2022, 11:28 AM [place] [address] Office [phone] Fax [phone]  Notice: This dictation was prepared with Dragon dictation along with smaller phrase technology. Any transcriptional errors that result from this process are unintentional and may not be corrected upon review.  I spent 14 minutes  examining this patient, reviewing medications, and using patient centered shared decision making involving her  cardiac care.  Prior to her visit I spent greater than 20 minutes reviewing her past medical history,  medications, and prior cardiac tests.

## 2022-04-27 ENCOUNTER — Encounter: Payer: Self-pay | Admitting: General Practice

## 2022-04-27 ENCOUNTER — Ambulatory Visit: Payer: PPO | Attending: General Practice | Admitting: General Practice

## 2022-04-27 VITALS — BP 128/66 | HR 60 | Ht 76.0 in | Wt 304.6 lb

## 2022-04-27 DIAGNOSIS — I10 Essential (primary) hypertension: Secondary | ICD-10-CM | POA: Diagnosis not present

## 2022-04-27 DIAGNOSIS — E785 Hyperlipidemia, unspecified: Secondary | ICD-10-CM | POA: Diagnosis not present

## 2022-04-27 DIAGNOSIS — I5042 Chronic combined systolic (congestive) and diastolic (congestive) heart failure: Secondary | ICD-10-CM

## 2022-04-27 DIAGNOSIS — I517 Cardiomegaly: Secondary | ICD-10-CM

## 2022-04-27 LAB — BASIC METABOLIC PANEL
BUN/Creatinine Ratio: 18 (ref 10–24)
BUN: 17 mg/dL (ref 8–27)
CO2: 27 mmol/L (ref 20–29)
Calcium: 9.1 mg/dL (ref 8.6–10.2)
Chloride: 98 mmol/L (ref 96–106)
Creatinine, Ser: 0.96 mg/dL (ref 0.76–1.27)
Glucose: 113 mg/dL — ABNORMAL HIGH (ref 70–99)
Potassium: 4.5 mmol/L (ref 3.5–5.2)
Sodium: 137 mmol/L (ref 134–144)
eGFR: 88 mL/min/{1.73_m2} (ref >59)

## 2022-04-27 MED ORDER — METOPROLOL SUCCINATE ER 50 MG PO TB24: 50.0000 mg | ORAL_TABLET | Freq: Every day | ORAL | 3 refills | Status: DC

## 2022-04-27 NOTE — Patient Instructions (Signed)
Medication Instructions:  The current medical regimen is effective;  continue present plan and medications as directed. Please refer to the Current Medication list given to you today.  *If you need a refill on your cardiac medications before your next appointment, please call your pharmacy*  Lab Work: BMET TODAY If you have labs (blood work) drawn today and your tests are completely normal, you will receive your results only by:  [place] (if you have MyChart) OR A paper copy in the mail.   If you have any lab test that is abnormal or we need to change your treatment, we will call you to review the results.  Testing/Procedures: NONE   Follow-Up: At [facility], you and your health needs are our priority.  As part of our continuing mission to provide you with exceptional heart care, we have created designated Provider Care Teams.  These Care Teams include your primary Cardiologist (physician) and Advanced Practice Providers (APPs -  Physician Assistants and Nurse Practitioners) who all work together to provide you with the care you need, when you need it.  Your next appointment:   12 month(s)  The format for your next appointment:   In Person  Provider:   Donato Heinz, MD     Other Instructions PURCHASE AND USE INCENTIVE SPIROMETER  INCREASE YOUR PHYSICAL ACTIVITY AS TOLERATED  Important Information About Sugar

## 2022-07-05 ENCOUNTER — Ambulatory Visit
Admission: RE | Admit: 2022-07-05 | Discharge: 2022-07-05 | Disposition: A | Discharge status: Home / Self Care | Payer: PPO | Source: Ambulatory Visit | Attending: Internal Medicine | Admitting: Internal Medicine

## 2022-07-05 DIAGNOSIS — C8589 Other specified types of non-Hodgkin lymphoma, extranodal and solid organ sites: Secondary | ICD-10-CM

## 2022-07-05 MED ORDER — GADOPICLENOL 0.5 MMOL/ML IV SOLN
10.0000 mL | Freq: Once | INTRAVENOUS | Status: AC | PRN
  Administered 2022-07-05: 10 mL via INTRAVENOUS

## 2022-07-06 ENCOUNTER — Other Ambulatory Visit: Payer: Self-pay | Admitting: Radiation Therapy

## 2022-07-13 ENCOUNTER — Inpatient Hospital Stay: Payer: PPO | Attending: Internal Medicine

## 2022-07-13 DIAGNOSIS — C8589 Other specified types of non-Hodgkin lymphoma, extranodal and solid organ sites: Secondary | ICD-10-CM | POA: Insufficient documentation

## 2022-07-14 ENCOUNTER — Other Ambulatory Visit: Payer: Self-pay

## 2022-07-14 ENCOUNTER — Inpatient Hospital Stay (HOSPITAL_BASED_OUTPATIENT_CLINIC_OR_DEPARTMENT_OTHER): Payer: PPO | Admitting: Internal Medicine

## 2022-07-14 VITALS — BP 144/72 | HR 60 | Temp 97.7°F | Resp 13 | Wt 302.2 lb

## 2022-07-14 DIAGNOSIS — C8589 Other specified types of non-Hodgkin lymphoma, extranodal and solid organ sites: Secondary | ICD-10-CM

## 2022-07-14 NOTE — Progress Notes (Signed)
[facility] [place], [place] [address] [phone]   Interval Evaluation  Date of Service: 07/14/22 Patient Name: Tim [name] Patient MRN: [id number] Patient DOB: 1956/05/09 Provider: Ventura Sellers, MD  Identifying Statement:  Tim [name] is a 67 y.o. male with left frontal  CNS lymphoma  Treatment Summary: 08/30/17: L frontal stereotactic biopsy with Dr. Vertell Limber 01/31/18: Completes 3rd cycle of HD-MTX and Rituximab 07/26/18: Completes 5th cycle of 5-day Temodar consolidation  Interval History:  SARP FEEHAN presents today for follow up after recent MRI brain.  No clinical changes reported today.  He continues to follow with vitreal specialist at [facility], no intervention recommended at this time. Otherwise denies progressive neurologic complaints, no language issues.  No seizures.  Medications: Current Outpatient Medications on File Prior to Visit  Medication Sig Dispense Refill   allopurinol (ZYLOPRIM) 300 MG tablet Take 300 mg by mouth daily.     b complex vitamins tablet Take 1 tablet by mouth daily.     blood glucose meter kit and supplies KIT Dispense based on patient and insurance preference. Use up to four times daily as directed. (FOR ICD-9 250.00, 250.01). 1 each 0   Insulin Isophane & Regular Human (NOVOLIN 70/30 FLEXPEN) (70-30) 100 UNIT/ML PEN Inject 15 Units into the skin 2 (two) times daily. 15 mL 11   Insulin Pen Needle (PEN NEEDLES 3/16") 31G X 5 MM MISC 1 Container by Does not apply route 2 (two) times daily. 100 each 0   metoprolol succinate (TOPROL-XL) 50 MG 24 hr tablet Take 1 tablet (50 mg total) by mouth daily. Take with or immediately following a meal. 90 tablet 3   Multiple Vitamin (MULTIVITAMIN) tablet Take 1 tablet by mouth daily.     ONETOUCH VERIO test strip 1 each daily.     sacubitril-valsartan (ENTRESTO) 49-51 MG Take 1 tablet by mouth 2 (two) times daily. 180 tablet 3    spironolactone (ALDACTONE) 25 MG tablet TAKE 1/2 TABLET BY MOUTH DAILY 45 tablet 5   Tiotropium Bromide-Olodaterol (STIOLTO RESPIMAT) 2.5-2.5 MCG/ACT AERS Inhale into the lungs.     Current Facility-Administered Medications on File Prior to Visit  Medication Dose Route Frequency Provider Last Rate Last Admin   heparin lock flush 100 unit/mL  500 Units Intracatheter Once Demarquis Osley, [name], MD       sodium chloride flush (NS) 0.9 % injection 10 mL  10 mL Intracatheter Once Ventura Sellers, MD        Allergies: No Known Allergies   Past Medical History:  Past Medical History:  Diagnosis Date   Arthritis    Brain tumor ([place])    COPD (chronic obstructive pulmonary disease) ([place])    Diabetes mellitus without complication ([place])    patient states he was taken off meds at last appointment with PCP and is diet controlled   Dyspnea    Ejection fraction < 50% 2021   Gout    Hypertension 08/02/2017   Pneumonia    Past Surgical History: none    Social History:  Social History   Socioeconomic History   Marital status: Significant Other    Spouse name: [name]   Number of children: Not on file   Years of education: 16   Highest education level: Bachelor's degree (e.g., BA, AB, BS)  Occupational History   Occupation: retired    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  Tobacco Use   Smoking status:  Former    Packs/day: 2.00    Years: 40.00    Total pack years: 80.00    Types: Cigarettes    Quit date: 08/10/2011    Years since quitting: 10.9   Smokeless tobacco: Never  Vaping Use   Vaping Use: Former   Quit date: 05/12/2011  Substance and Sexual Activity   Alcohol use: Yes    Alcohol/week: 14.0 standard drinks of alcohol    Types: 14 Cans of beer per week   Drug use: Never   Sexual activity: Not Currently  Other Topics Concern   Not on file  Social History Narrative   Not on file   Social Determinants of Health   Financial Resource Strain: Low Risk  (08/30/2017)   Overall  Financial Resource Strain (CARDIA)    Difficulty of Paying Living Expenses: Not hard at all  Food Insecurity: No Food Insecurity (08/30/2017)   Hunger Vital Sign    Worried About Running Out of Food in the Last Year: Never true    Ran Out of Food in the Last Year: Never true  Transportation Needs: No Transportation Needs (08/30/2017)   PRAPARE - Hydrologist (Medical): No    Lack of Transportation (Non-Medical): No  Physical Activity: Inactive (08/30/2017)   Exercise Vital Sign    Days of Exercise per Week: 0 days    Minutes of Exercise per Session: 0 min  Stress: No Stress Concern Present (08/30/2017)   [place]    Feeling of Stress : Only a little  Social Connections: Moderately Isolated (08/30/2017)   Social Connection and Isolation Panel [NHANES]    Frequency of Communication with Friends and Family: Once a week    Frequency of Social Gatherings with Friends and Family: Once a week    Attends Religious Services: Never    Marine scientist or Organizations: No    Attends Archivist Meetings: Never    Marital Status: Living with partner  Intimate Partner Violence: Not At Risk (08/30/2017)   Humiliation, Afraid, Rape, and Kick questionnaire    Fear of Current or Ex-Partner: No    Emotionally Abused: No    Physically Abused: No    Sexually Abused: No   Family History:  Family History  Problem Relation Age of Onset   Cancer Brother     Review of Systems: Constitutional: Denies fevers, chills or abnormal weight loss Eyes: Denies blurriness of vision Ears, nose, mouth, throat, and face: Denies mucositis or sore throat Respiratory: denies sob Cardiovascular: Denies palpitation, chest discomfort or lower extremity swelling Gastrointestinal:  Denies nausea, constipation, diarrhea GU: Denies dysuria or incontinence Skin: Denies abnormal skin rashes Neurological: Per  HPI Musculoskeletal: Denies joint pain, back or neck discomfort. No decrease in ROM Behavioral/Psych: Denies anxiety, disturbance in thought content, and mood instability  Physical Exam: Vitals:   07/14/22 1014  BP: (!) 144/72  Pulse: 60  Resp: 13  Temp: 97.7 F (36.5 C)  SpO2: 90%    KPS: 80. General: Alert, cooperative, pleasant, in no acute distress Head: Biopsy scar noted, dry and intact. EENT: No conjunctival injection or scleral icterus. Oral mucosa moist Lungs: Resp effort normal Cardiac: Regular rate and rhythm Abdomen: Soft, non-distended abdomen Skin: No rashes cyanosis or petechiae. Extremities: No clubbing or edema  Neurologic Exam: Mental Status: Awake, alert, attentive to examiner. Oriented to self and environment. Language fluent with intact comprehension, repetition, reading. Cranial Nerves: Visual acuity  is grossly normal. Visual fields are full. Extra-ocular movements intact. No ptosis. Face is symmetric, tongue midline. Motor: Tone and bulk are normal. Full power in arms and legs. Reflexes are symmetric, no pathologic reflexes present. Intact finger to nose bilaterally Sensory: Intact to light touch and temperature Gait: Normal, independent   Labs: I have reviewed the data as listed    Component Value Date/Time   NA 137 04/27/2022 1112   K 4.5 04/27/2022 1112   CL 98 04/27/2022 1112   CO2 27 04/27/2022 1112   GLUCOSE 113 (H) 04/27/2022 1112   GLUCOSE 191 (H) 12/20/2018 0929   BUN 17 04/27/2022 1112   CREATININE 0.96 04/27/2022 1112   CREATININE 0.97 12/20/2018 0929   CALCIUM 9.1 04/27/2022 1112   PROT 6.9 12/29/2019 1619   ALBUMIN 4.3 12/29/2019 1619   AST 44 (H) 12/29/2019 1619   AST 50 (H) 12/20/2018 0929   ALT 49 (H) 12/29/2019 1619   ALT 59 (H) 12/20/2018 0929   ALKPHOS 97 12/29/2019 1619   BILITOT 0.7 12/29/2019 1619   BILITOT 0.5 12/20/2018 0929   GFRNONAA 93 12/29/2019 1619   GFRNONAA >60 12/20/2018 0929   GFRAA 107 12/29/2019 1619    GFRAA >60 12/20/2018 0929   Lab Results  Component Value Date   WBC 6.2 11/14/2019   NEUTROABS 3.6 11/14/2019   HGB 16.9 11/14/2019   HCT 51.5 11/14/2019   MCV 98.3 11/14/2019   PLT 139 (L) 11/14/2019    Imaging:  [facility] Clinician Interpretation: I have personally reviewed the MRI images from 09/25/20.  My interpretation, in the context of the patient's clinical presentation, is stable disease  Pending official radiology interpretation MR BRAIN W WO CONTRAST  Result Date: 07/07/2022 CLINICAL DATA:  Follow-up brain metastases. EXAM: MRI HEAD WITHOUT AND WITH CONTRAST TECHNIQUE: Multiplanar, multiecho pulse sequences of the brain and surrounding structures were obtained without and with intravenous contrast. CONTRAST:  10 cc Vueway COMPARISON:  Brain MRI most recently 10/09/2021. FINDINGS: Brain: Postsurgical changes reflecting left parietal craniotomy are again seen. A small area of encephalomalacia, gliosis, and chronic blood products in the underlying frontal lobe is unchanged. There is no new enhancement at this location. The smaller focus of encephalomalacia and gliosis more anteriorly in the left superior frontal gyrus is unchanged, also without abnormal enhancement (4-21). There is no other abnormal enhancement. There is no acute intracranial hemorrhage, extra-axial fluid collection, or acute infarct. Parenchymal volume is normal. The ventricles are stable in size, with unchanged slight ex vacuo dilatation of the body of the left lateral ventricle. There is a small focus of FLAIR signal abnormality in the right frontal white matter which is new since the prior study but without hemorrhage, diffusion restriction, or enhancement. The pituitary and suprasellar region are normal. There is no mass effect or midline shift. Vascular: Normal flow voids. Skull and upper cervical spine: Normal marrow signal. Sinuses/Orbits: The paranasal sinuses are clear. The globes and orbits are unremarkable. Other:  None. IMPRESSION: Stable exam with no evidence of recurrent disease. Electronically Signed   By: Valetta Mole M.D.   On: 07/07/2022 14:46     Assessment/Plan 1. Primary CNS lymphoma [facility])  Mr. Malott is clinically and radiographically stable today.  No new or progressive changes.    He will con't to follow with vitreal specialist at [facility] every 6 months.  AVALON ZUBIA should return to clinic in 12 months following next brain MRI, or sooner as needed.  We appreciate the opportunity to participate  in the care of VIOLA MICHALCZYK.   I have spent a total of 30 minutes of face-to-face and non-face-to-face time, excluding clinical staff time, preparing to see patient, ordering tests and/or medications, counseling the patient, and independently interpreting results and communicating results to the patient/family/caregiver    Ventura Sellers, MD Medical Director of Neuro-Oncology [facility] [place] 07/14/22 10:21 AM

## 2022-07-15 ENCOUNTER — Other Ambulatory Visit: Payer: Self-pay | Admitting: Radiation Therapy

## 2022-10-30 ENCOUNTER — Ambulatory Visit
Admission: RE | Admit: 2022-10-30 | Discharge: 2022-10-30 | Disposition: A | Discharge status: Home / Self Care | Payer: PPO | Source: Ambulatory Visit | Attending: Family Medicine | Admitting: Family Medicine

## 2022-10-30 ENCOUNTER — Other Ambulatory Visit: Payer: Self-pay | Admitting: Family Medicine

## 2022-10-30 DIAGNOSIS — G8929 Other chronic pain: Secondary | ICD-10-CM

## 2023-01-13 ENCOUNTER — Other Ambulatory Visit: Payer: Self-pay | Admitting: Cardiology

## 2023-03-18 ENCOUNTER — Telehealth: Payer: Self-pay | Admitting: Cardiology

## 2023-03-18 NOTE — Telephone Encounter (Signed)
Paper Work Dropped Off: [employer] Patient assistance for for provider sign-off.  Date: 03/18/2023  Location of paper: Placed in Dr. Campbell Lerner [place]  Patient already submitted his portion to the company.

## 2023-03-22 ENCOUNTER — Other Ambulatory Visit: Payer: Self-pay

## 2023-03-22 MED ORDER — SACUBITRIL-VALSARTAN 49-51 MG PO TABS: 1.0000 | ORAL_TABLET | Freq: Two times a day (BID) | ORAL | 3 refills | Status: DC

## 2023-03-23 ENCOUNTER — Telehealth: Payer: Self-pay

## 2023-03-23 NOTE — Telephone Encounter (Signed)
[employer] patient assistance form completed and faxed to fax # [phone].

## 2023-04-19 ENCOUNTER — Other Ambulatory Visit: Payer: Self-pay | Admitting: Cardiology

## 2023-05-05 NOTE — Progress Notes (Unsigned)
Cardiology Office Note:    Date:  05/06/2023   ID:  Tim [name], DOB 1955/09/06, MRN [id number]  PCP:  Verlon Au, MD  Cardiologist:  Little Ishikawa, MD  Electrophysiologist:  None   Referring MD: Verlon Au, MD   Chief Complaint  Patient presents with   Follow-up   Shortness of Breath    History of Present Illness:    Tim [name] is a 67 y.o. 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  He was referred by Dr. Leavy Cella for evaluation of dyspnea on exertion, initially seen on 09/04/2019  TTE 12/2017 showed normal systolic function, mild left atrial dilatation, mild MR. TTE on 09/22/2019 showed LVEF 40 to 45%, global hypokinesis, grade 1 diastolic dysfunction, normal RV function, no significant valvular disease.  Calcium score on 09/22/2019 was 8 (31st percentile).  Coronary CTA on 10/27/2019 showed nonobstructive CAD with calcified plaque in left main and mid RCA causing minimal (0 to 24%) stenosis.  CMR 12/26/19 shows LVEF improved to nomral (56%), mild RV dysfunction (EF 45%), asymmetric LVH measuring up to 14mm, basal inferolateral midwall LGE (could represent prior myocarditis, though Fabry's disease on differential given LGE location with LVH and relatively low native T1 values).  Since last clinic visit, he reports he is doing okay.  Continues to have shortness of breath.  Denies any chest pain.  Reports some swelling in his feet.  Denies any palpitations.  Past Medical History:  Diagnosis Date   Arthritis    Brain tumor [facility])    COPD (chronic obstructive pulmonary disease) (HCC)    Diabetes mellitus without complication (HCC)    patient states he was taken off meds at last appointment with PCP and is diet controlled   Dyspnea    Ejection fraction < 50% 2021   Gout    Hypertension 08/02/2017   Pneumonia     Past Surgical History:   Procedure Laterality Date   APPLICATION OF CRANIAL NAVIGATION Left 08/30/2017   Procedure: APPLICATION OF CRANIAL NAVIGATION;  Surgeon: Maeola Harman, MD;  Location: [facility] OR;  Service: Neurosurgery;  Laterality: Left;   IR IMAGING GUIDED PORT INSERTION  12/27/2017   IR REMOVAL TUN ACCESS W/ PORT W/O FL MOD SED  11/14/2019   STERIOTACTIC STIMULATOR INSERTION Left 08/30/2017   Procedure: LEFT Frontal Sterotactic Brain Biopsy with BrainLab;  Surgeon: Maeola Harman, MD;  Location: [facility] OR;  Service: Neurosurgery;  Laterality: Left;    Current Medications: Current Meds  Medication Sig   allopurinol (ZYLOPRIM) 300 MG tablet Take 300 mg by mouth daily.   b complex vitamins tablet Take 1 tablet by mouth daily.   blood glucose meter kit and supplies KIT Dispense based on patient and insurance preference. Use up to four times daily as directed. (FOR ICD-9 250.00, 250.01).   Insulin Isophane & Regular Human (NOVOLIN 70/30 FLEXPEN) (70-30) 100 UNIT/ML PEN Inject 15 Units into the skin 2 (two) times daily.   Insulin Pen Needle (PEN NEEDLES 3/16") 31G X 5 MM MISC 1 Container by Does not apply route 2 (two) times daily.   metoprolol succinate (TOPROL-XL) 50 MG 24 hr tablet Take 1 tablet (50 mg total) by mouth daily. Take with or immediately following a meal.   Multiple Vitamin (MULTIVITAMIN) tablet Take 1 tablet by mouth daily.   ONETOUCH VERIO test strip 1 each daily.   rosuvastatin (CRESTOR) 5 MG tablet Take 1  tablet (5 mg total) by mouth daily.   sacubitril-valsartan (ENTRESTO) 49-51 MG Take 1 tablet by mouth 2 (two) times daily.   spironolactone (ALDACTONE) 25 MG tablet Take 0.5 tablets (12.5 mg total) by mouth daily. Please keep scheduled appointment for future refills. Thank you.   Tiotropium Bromide-Olodaterol (STIOLTO RESPIMAT) 2.5-2.5 MCG/ACT AERS Inhale into the lungs.     Allergies:   Patient has no known allergies.   Social History   Socioeconomic History   Marital status: Significant Other     Spouse name: [name]   Number of children: Not on file   Years of education: 16   Highest education level: Bachelor's degree (e.g., BA, AB, BS)  Occupational History   Occupation: retired    Associate Professor: [employer]    Comment: disability  Tobacco Use   Smoking status: Former    Current packs/day: 0.00    Average packs/day: 2.0 packs/day for 40.0 years (80.0 ttl pk-yrs)    Types: Cigarettes    Start date: 08/10/1971    Quit date: 08/10/2011    Years since quitting: 11.7   Smokeless tobacco: Never  Vaping Use   Vaping status: Former   Quit date: 05/12/2011  Substance and Sexual Activity   Alcohol use: Yes    Alcohol/week: 14.0 standard drinks of alcohol    Types: 14 Cans of beer per week   Drug use: Never   Sexual activity: Not Currently  Other Topics Concern   Not on file  Social History Narrative   Not on file   Social Drivers of Health   Financial Resource Strain: Low Risk  (08/30/2017)   Overall Financial Resource Strain (CARDIA)    Difficulty of Paying Living Expenses: Not hard at all  Food Insecurity: No Food Insecurity (08/30/2017)   Hunger Vital Sign    Worried About Running Out of Food in the Last Year: Never true    Ran Out of Food in the Last Year: Never true  Transportation Needs: No Transportation Needs (08/30/2017)   PRAPARE - Administrator, Civil Service (Medical): No    Lack of Transportation (Non-Medical): No  Physical Activity: Inactive (08/30/2017)   Exercise Vital Sign    Days of Exercise per Week: 0 days    Minutes of Exercise per Session: 0 min  Stress: No Stress Concern Present (08/30/2017)   [employer] of Occupational Health - Occupational Stress Questionnaire    Feeling of Stress : Only a little  Social Connections: Moderately Isolated (08/30/2017)   Social Connection and Isolation Panel [NHANES]    Frequency of Communication with Friends and Family: Once a week    Frequency of Social Gatherings with Friends and Family: Once a week    Attends  Religious Services: Never    Database administrator or Organizations: No    Attends Engineer, structural: Never    Marital Status: Living with partner     Family History: The patient's family history includes Cancer in his brother.  ROS:   Please see the history of present illness.     All other systems reviewed and are negative.  EKGs/Labs/Other Studies Reviewed:    The following studies were reviewed today:   EKG:   05/06/2023: Normal sinus rhythm, right bundle branch block, left anterior fascicular block, rate 63 Recent Labs: No results found for requested labs within last 365 days.  Recent Lipid Panel    Component Value Date/Time   CHOL 198 10/02/2019 1147   TRIG 364 (H)  10/02/2019 1147   HDL 39 (L) 10/02/2019 1147   CHOLHDL 5.1 (H) 10/02/2019 1147   LDLCALC 98 10/02/2019 1147    Physical Exam:    VS:  BP 124/62 (BP Location: Right Arm, Patient Position: Sitting, Cuff Size: Large)   Pulse 63   Ht 6\' 3"  (1.905 m)   Wt (!) 310 lb (140.6 kg)   BMI 38.75 kg/m     Wt Readings from Last 3 Encounters:  05/06/23 (!) 310 lb (140.6 kg)  07/14/22 (!) 302 lb 3.2 oz (137.1 kg)  04/27/22 (!) 304 lb 9.6 oz (138.2 kg)     GEN:  in no acute distress HEENT: Normal NECK: No JVD LYMPHATICS: No lymphadenopathy CARDIAC: RRR, no murmurs, rubs, gallops RESPIRATORY:  Clear to auscultation without rales, wheezing or rhonchi  ABDOMEN: Soft, non-tender, non-distended MUSCULOSKELETAL:  trace edema; No deformity  SKIN: Warm and dry NEUROLOGIC:  Alert and oriented x 3 PSYCHIATRIC:  Normal affect   ASSESSMENT:    1. Chronic combined systolic and diastolic heart failure (HCC)   2. Essential hypertension   3. Snoring   4. Daytime somnolence   5. Hyperlipidemia, unspecified hyperlipidemia type     PLAN:    Chronic combined systolic and diastolic heart failure: EF 40 to 45% on TTE 09/22/19.  Does not appear volume overloaded on exam.  Nonischemic etiology, as coronary  CTA on 10/27/2019 showed nonobstructive CAD with calcified plaque in left main and mid RCA causing minimal (0 to 24%) stenosis.  CMR 12/26/19 shows LVEF improved to normal (56%), mild RV dysfunction (EF 45%), asymmetric LVH measuring up to 14mm, basal inferolateral midwall LGE (could represent prior myocarditis, though Fabry's disease on differential given LGE location with LVH and relatively low native T1 values).  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  Will check CMET -Continue Toprol-XL 50 mg daily -Continue spironolactone 12.5 mg daily -Update echocardiogram.  Hypertension: On Entresto, Toprol-XL, and spironolactone as above.  Appears controlled.  Hyperlipidemia: Recommend statin given diabetes history.  Reports had an issue with statin in the past but does not remember the details, states was years ago.  Will trial on rosuvastatin 5 mg daily  Type 2 diabetes: On insulin.  A1c 6.2% 08/11/22  Snoring/daytime somnolence: Check [facility] sleep study.  STOP-BANG 6  RTC in 6 months  Medication Adjustments/Labs and Tests Ordered: Current medicines are reviewed at length with the patient today.  Concerns regarding medicines are outlined above.  Orders Placed This Encounter  Procedures   Comprehensive Metabolic Panel (CMET)   Magnesium   CBC with Differential/Platelet   Lipid panel   EKG 12-Lead   ECHOCARDIOGRAM COMPLETE   [facility] Sleep Study   Meds ordered this encounter  Medications   rosuvastatin (CRESTOR) 5 MG tablet    Sig: Take 1 tablet (5 mg total) by mouth daily.    Dispense:  90 tablet    Refill:  3    Patient Instructions  Medication Instructions:  Your physician has recommended you make the following change in your medication:  START: (Crestor) 5 mg once nightly.  *If you need a refill on your cardiac  medications before your next appointment, please call your pharmacy*   Lab Work: CMET, Mag, CBC, Lipids If you have labs (blood work) drawn today and your tests are completely normal, you will receive your results only by: [employer] (  if you have MyChart) OR A paper copy in the mail If you have any lab test that is abnormal or we need to change your treatment, we will call you to review the results.   Testing/Procedures: Your physician has requested that you have an echocardiogram. Echocardiography is a painless test that uses sound waves to create images of your heart. It provides your doctor with information about the size and shape of your heart and how well your heart's chambers and valves are working. This procedure takes approximately one hour. There are no restrictions for this procedure. Please do NOT wear cologne, perfume, aftershave, or lotions (deodorant is allowed). Please arrive 15 minutes prior to your appointment time.  Please note: We ask at that you not bring children with you during ultrasound (echo/ vascular) testing. Due to room size and safety concerns, children are not allowed in the ultrasound rooms during exams. Our front office staff cannot provide observation of children in our lobby area while testing is being conducted. An adult accompanying a patient to their appointment will only be allowed in the ultrasound room at the discretion of the ultrasound technician under special circumstances. We apologize for any inconvenience.  WatchPAT? is an FDA-cleared portable home sleep study test that uses a watch and 3 points of contact to monitor 7 different channels, including your heart rate, oxygen saturation, body position, snoring, and chest motion.  The study is easy to use from the comfort of your own home and accurately detect sleep apnea.  Before bed, you attach the chest sensor, attached the sleep apnea bracelet to your nondominant hand, and attach the finger probe.   After the study, the raw data is downloaded from the watch and scored for apnea events.   For more information: [email or web address]  Patient Testing Instructions:  Once our office has received insurance approval for you to complete this test, we will contact you with a PIN to activate the device.  This typically takes 2-3 weeks.  Please do not open the box until approved.   Do not put battery into the device until bedtime when you are ready to begin the test. Please call the support number if you need assistance after following the instructions below: 24 hour support line- [phone] or [facility] support at [phone] (option 2)  Download the [facility] One" app through the [employer] or [employer]. Be sure to turn on or enable access to Bluetooth in settings on your smartphone. Make sure no other Bluetooth devices are on and within the vicinity of your smartphone and WatchPAT watch during testing.  Make sure to leave your smart phone plugged in and charging all night.  When ready for bed:  Follow the instructions step by step in the WatchPAT One app to activate the testing device. For additional instructions, including video instruction, visit the WatchPAT One video on Youtube. You can search for WatchPAT One within Youtube (video is 4 minutes and 18 seconds) or enter: https://youtube/watch?[email or web address]_vbiwxE Please note: You will be prompted to enter a PIN to connect via Bluetooth when starting the test. The PIN will be assigned to you after insurance has approved the test.  The device is disposable, but it recommended that you retain the device until you receive a call letting you know the study has been received and the results have been interpreted.  We will let you know if the study did not transmit to [place] properly after the test is completed. You do not need to  call us to confirm the receipt of the test.  Please complete the test within 48 hours of receiving  PIN.   Frequently Asked Questions:  What is Watch PAT One?  A single use, fully disposable home sleep apnea testing device and will not need to be returned after completion.  What are the requirements to use WatchPAT One?  A successful WatchPAT One sleep study requires a WatchPAT One device, your smart phone, WatchPAT One app, your PIN number, and internet access. What type of phone do I need?  You should have a smart phone that uses Android 5.1 and above or any iPhone with IOS 10 and above. How can I download the WatchPAT one app?  Based on your device type search for WatchPAT One app either in [employer] for [employer] or [employer] for [employer]. Where will I get my PIN for the study?  Your PIN will be provided by your physician's office after insurance has approved the test. This process typically takes 2-3 weeks. It is used for authentication and if you lose/forget your PIN, please reach out to your provider's office.  I do not have internet at home. Can I still complete a WatchPAT One study?  WatchPAT One needs internet connection throughout the night to be able to transmit the sleep data. You can use your home/local internet or your cellular data package. However, it is always recommended to use home/local internet. It is estimated that between 20MB-30MB of data will be used with each study, but the application will be looking for space in the phone to start the study.  What happens if I lose internet or Bluetooth connection?  During the internet disconnection, your phone will not be able to transmit the sleep data.  All the data, will be stored in your phone.  As soon as the internet connection is back on, the phone will resume sending the sleep data. During the Bluetooth disconnection, WatchPAT One will not be able to to send the sleep data to your phone.  Data will be kept in the WatchPAT One until both devices have Bluetooth connection back on.  As soon as the  connection is back on, WatchPAT one will send the sleep data to the phone.  How long do I need to wear the WatchPAT one?  After you start the study, you should wear the device at least 6 hours.  How far should I keep my phone from the device?  During the night, your phone should remain within 15 feet of where you sleep.  What happens if I leave the room for restroom or other reasons?  Leaving the room for any reason will not cause any problem. As soon as your get back to the room, both devices will reconnect and will continue to send the sleep data. Can I use my phone during the sleep study?  Yes, you can use your phone as usual during the study. But it is recommended to put your WatchPAT One on when you are ready to go to bed.  How will I get my study results?  A soon as you completed your study, your sleep data will be sent to the provider. They will then share the results with you when they are ready.     Follow-Up: At [facility], you and your health needs are our priority.  As part of our continuing mission to provide you with exceptional heart care, we have created designated Provider Care Teams.  These Care Teams include your primary Cardiologist (physician) and Advanced Practice Providers (APPs -  Physician Assistants and Nurse Practitioners) who all work together to provide you with the care you need, when you need it.  Your next appointment:   6 month(s)  Provider:   Little Ishikawa, MD     Signed, Little Ishikawa, MD  05/06/2023 3:03 PM    [facility]

## 2023-05-06 ENCOUNTER — Encounter: Payer: Self-pay | Admitting: Cardiology

## 2023-05-06 ENCOUNTER — Ambulatory Visit: Payer: PPO | Attending: Cardiology | Admitting: Cardiology

## 2023-05-06 VITALS — BP 124/62 | HR 63 | Ht 75.0 in | Wt 310.0 lb

## 2023-05-06 DIAGNOSIS — I5042 Chronic combined systolic (congestive) and diastolic (congestive) heart failure: Secondary | ICD-10-CM

## 2023-05-06 DIAGNOSIS — I10 Essential (primary) hypertension: Secondary | ICD-10-CM | POA: Diagnosis not present

## 2023-05-06 DIAGNOSIS — R400 Somnolence: Secondary | ICD-10-CM

## 2023-05-06 DIAGNOSIS — R0683 Snoring: Secondary | ICD-10-CM

## 2023-05-06 DIAGNOSIS — E785 Hyperlipidemia, unspecified: Secondary | ICD-10-CM

## 2023-05-06 MED ORDER — ROSUVASTATIN CALCIUM 5 MG PO TABS: 5.0000 mg | ORAL_TABLET | Freq: Every day | ORAL | 3 refills | Status: DC

## 2023-05-06 NOTE — Patient Instructions (Signed)
Medication Instructions:  Your physician has recommended you make the following change in your medication:  START: (Crestor) 5 mg once nightly.  *If you need a refill on your cardiac medications before your next appointment, please call your pharmacy*   Lab Work: CMET, Mag, CBC, Lipids If you have labs (blood work) drawn today and your tests are completely normal, you will receive your results only by: MyChart Message (if you have MyChart) OR A paper copy in the mail If you have any lab test that is abnormal or we need to change your treatment, we will call you to review the results.   Testing/Procedures: Your physician has requested that you have an echocardiogram. Echocardiography is a painless test that uses sound waves to create images of your heart. It provides your doctor with information about the size and shape of your heart and how well your heart's chambers and valves are working. This procedure takes approximately one hour. There are no restrictions for this procedure. Please do NOT wear cologne, perfume, aftershave, or lotions (deodorant is allowed). Please arrive 15 minutes prior to your appointment time.  Please note: We ask at that you not bring children with you during ultrasound (echo/ vascular) testing. Due to room size and safety concerns, children are not allowed in the ultrasound rooms during exams. Our front office staff cannot provide observation of children in our lobby area while testing is being conducted. An adult accompanying a patient to their appointment will only be allowed in the ultrasound room at the discretion of the ultrasound technician under special circumstances. We apologize for any inconvenience.  WatchPAT? is an FDA-cleared portable home sleep study test that uses a watch and 3 points of contact to monitor 7 different channels, including your heart rate, oxygen saturation, body position, snoring, and chest motion.  The study is easy to use from the  comfort of your own home and accurately detect sleep apnea.  Before bed, you attach the chest sensor, attached the sleep apnea bracelet to your nondominant hand, and attach the finger probe.  After the study, the raw data is downloaded from the watch and scored for apnea events.   For more information: [email or web address]  Patient Testing Instructions:  Once our office has received insurance approval for you to complete this test, we will contact you with a PIN to activate the device.  This typically takes 2-3 weeks.  Please do not open the box until approved.   Do not put battery into the device until bedtime when you are ready to begin the test. Please call the support number if you need assistance after following the instructions below: 24 hour support line- [phone] or [facility] support at [phone] (option 2)  Download the [facility]" app through the [employer] or [employer]. Be sure to turn on or enable access to Bluetooth in settings on your smartphone. Make sure no other Bluetooth devices are on and within the vicinity of your smartphone and WatchPAT watch during testing.  Make sure to leave your smart phone plugged in and charging all night.  When ready for bed:  Follow the instructions step by step in the WatchPAT One app to activate the testing device. For additional instructions, including video instruction, visit the WatchPAT One video on Youtube. You can search for WatchPAT One within Youtube (video is 4 minutes and 18 seconds) or enter: https://youtube/watch?[email or web address]_vbiwxE Please note: You will be prompted to enter a PIN to connect via Bluetooth when starting the  test. The PIN will be assigned to you after insurance has approved the test.  The device is disposable, but it recommended that you retain the device until you receive a call letting you know the study has been received and the results have been interpreted.  We will let you know if  the study did not transmit to [place] properly after the test is completed. You do not need to call us to confirm the receipt of the test.  Please complete the test within 48 hours of receiving PIN.   Frequently Asked Questions:  What is Watch PAT One?  A single use, fully disposable home sleep apnea testing device and will not need to be returned after completion.  What are the requirements to use WatchPAT One?  A successful WatchPAT One sleep study requires a WatchPAT One device, your smart phone, WatchPAT One app, your PIN number, and internet access. What type of phone do I need?  You should have a smart phone that uses Android 5.1 and above or any iPhone with IOS 10 and above. How can I download the WatchPAT one app?  Based on your device type search for WatchPAT One app either in [employer] for [employer] or [employer] for [employer]. Where will I get my PIN for the study?  Your PIN will be provided by your physician's office after insurance has approved the test. This process typically takes 2-3 weeks. It is used for authentication and if you lose/forget your PIN, please reach out to your provider's office.  I do not have internet at home. Can I still complete a WatchPAT One study?  WatchPAT One needs internet connection throughout the night to be able to transmit the sleep data. You can use your home/local internet or your cellular data package. However, it is always recommended to use home/local internet. It is estimated that between 20MB-30MB of data will be used with each study, but the application will be looking for space in the phone to start the study.  What happens if I lose internet or Bluetooth connection?  During the internet disconnection, your phone will not be able to transmit the sleep data.  All the data, will be stored in your phone.  As soon as the internet connection is back on, the phone will resume sending the sleep data. During the Bluetooth  disconnection, WatchPAT One will not be able to to send the sleep data to your phone.  Data will be kept in the WatchPAT One until both devices have Bluetooth connection back on.  As soon as the connection is back on, WatchPAT one will send the sleep data to the phone.  How long do I need to wear the WatchPAT one?  After you start the study, you should wear the device at least 6 hours.  How far should I keep my phone from the device?  During the night, your phone should remain within 15 feet of where you sleep.  What happens if I leave the room for restroom or other reasons?  Leaving the room for any reason will not cause any problem. As soon as your get back to the room, both devices will reconnect and will continue to send the sleep data. Can I use my phone during the sleep study?  Yes, you can use your phone as usual during the study. But it is recommended to put your WatchPAT One on when you are ready to go to bed.  How will I  get my study results?  A soon as you completed your study, your sleep data will be sent to the provider. They will then share the results with you when they are ready.     Follow-Up: At [facility], you and your health needs are our priority.  As part of our continuing mission to provide you with exceptional heart care, we have created designated Provider Care Teams.  These Care Teams include your primary Cardiologist (physician) and Advanced Practice Providers (APPs -  Physician Assistants and Nurse Practitioners) who all work together to provide you with the care you need, when you need it.  Your next appointment:   6 month(s)  Provider:   Little Ishikawa, MD

## 2023-05-07 ENCOUNTER — Telehealth: Payer: Self-pay | Admitting: *Deleted

## 2023-05-07 LAB — CBC WITH DIFFERENTIAL/PLATELET
Basophils Absolute: 0.1 10*3/uL (ref 0.0–0.2)
Basos: 1 %
EOS (ABSOLUTE): 0.2 10*3/uL (ref 0.0–0.4)
Eos: 2 %
Hematocrit: 45.7 % (ref 37.5–51.0)
Hemoglobin: 15 g/dL (ref 13.0–17.7)
Immature Grans (Abs): 0 10*3/uL (ref 0.0–0.1)
Immature Granulocytes: 0 %
Lymphocytes Absolute: 1.6 10*3/uL (ref 0.7–3.1)
Lymphs: 22 %
MCH: 31.7 pg (ref 26.6–33.0)
MCHC: 32.8 g/dL (ref 31.5–35.7)
MCV: 97 fL (ref 79–97)
Monocytes Absolute: 0.8 10*3/uL (ref 0.1–0.9)
Monocytes: 11 %
Neutrophils Absolute: 4.5 10*3/uL (ref 1.4–7.0)
Neutrophils: 64 %
Platelets: 167 10*3/uL (ref 150–450)
RBC: 4.73 x10E6/uL (ref 4.14–5.80)
RDW: 12.7 % (ref 11.6–15.4)
WBC: 7.1 10*3/uL (ref 3.4–10.8)

## 2023-05-07 LAB — COMPREHENSIVE METABOLIC PANEL
ALT: 28 [IU]/L (ref 0–44)
AST: 33 [IU]/L (ref 0–40)
Albumin: 3.9 g/dL (ref 3.9–4.9)
Alkaline Phosphatase: 93 [IU]/L (ref 44–121)
BUN/Creatinine Ratio: 15 (ref 10–24)
BUN: 12 mg/dL (ref 8–27)
Bilirubin Total: 0.4 mg/dL (ref 0.0–1.2)
CO2: 28 mmol/L (ref 20–29)
Calcium: 9.1 mg/dL (ref 8.6–10.2)
Chloride: 93 mmol/L — ABNORMAL LOW (ref 96–106)
Creatinine, Ser: 0.81 mg/dL (ref 0.76–1.27)
Globulin, Total: 2.7 g/dL (ref 1.5–4.5)
Glucose: 103 mg/dL — ABNORMAL HIGH (ref 70–99)
Potassium: 5 mmol/L (ref 3.5–5.2)
Sodium: 135 mmol/L (ref 134–144)
Total Protein: 6.6 g/dL (ref 6.0–8.5)
eGFR: 97 mL/min/{1.73_m2} (ref >59)

## 2023-05-07 LAB — LIPID PANEL
Chol/HDL Ratio: 3.8 {ratio} (ref 0.0–5.0)
Cholesterol, Total: 188 mg/dL (ref 100–199)
HDL: 50 mg/dL (ref >39)
LDL Chol Calc (NIH): 83 mg/dL (ref 0–99)
Triglycerides: 336 mg/dL — ABNORMAL HIGH (ref 0–149)
VLDL Cholesterol Cal: 55 mg/dL — ABNORMAL HIGH (ref 5–40)

## 2023-05-07 LAB — MAGNESIUM: Magnesium: 1.7 mg/dL (ref 1.6–2.3)

## 2023-05-07 NOTE — Telephone Encounter (Signed)
Called and spoke to patient and made patient aware that per Dr. Bjorn Pippin recommendations potassium upper limits of normal, with heart pumping back to normal suggest stopping spironolactone. Spironolactone stopped and patient made aware and verbalized understanding.

## 2023-05-28 ENCOUNTER — Telehealth: Payer: Self-pay | Admitting: Cardiology

## 2023-05-28 NOTE — Telephone Encounter (Signed)
Patient is calling in to get sleep apena code. Please advise

## 2023-06-04 ENCOUNTER — Ambulatory Visit (HOSPITAL_COMMUNITY): Payer: PPO | Attending: Cardiology

## 2023-06-04 DIAGNOSIS — I5042 Chronic combined systolic (congestive) and diastolic (congestive) heart failure: Secondary | ICD-10-CM | POA: Diagnosis present

## 2023-06-04 LAB — ECHOCARDIOGRAM COMPLETE
Area-P 1/2: 2.29 cm2
Calc EF: 51.8 %
Est EF: 45
S' Lateral: 3.11 cm
Single Plane A2C EF: 50.1 %
Single Plane A4C EF: 54 %

## 2023-06-10 NOTE — Telephone Encounter (Signed)
Prior Authorization for Home Sleep Study has been faxed to [employer] for review. Will contact out to patient once we receive approval or denial.

## 2023-06-15 ENCOUNTER — Telehealth (HOSPITAL_BASED_OUTPATIENT_CLINIC_OR_DEPARTMENT_OTHER): Payer: Self-pay | Admitting: *Deleted

## 2023-06-15 ENCOUNTER — Ambulatory Visit (HOSPITAL_BASED_OUTPATIENT_CLINIC_OR_DEPARTMENT_OTHER): Payer: PPO | Admitting: Cardiology

## 2023-06-15 ENCOUNTER — Encounter (HOSPITAL_BASED_OUTPATIENT_CLINIC_OR_DEPARTMENT_OTHER): Payer: Self-pay | Admitting: Cardiology

## 2023-06-15 VITALS — BP 146/70 | HR 67 | Ht 76.0 in | Wt 309.3 lb

## 2023-06-15 DIAGNOSIS — I10 Essential (primary) hypertension: Secondary | ICD-10-CM

## 2023-06-15 DIAGNOSIS — R0683 Snoring: Secondary | ICD-10-CM

## 2023-06-15 DIAGNOSIS — E785 Hyperlipidemia, unspecified: Secondary | ICD-10-CM

## 2023-06-15 DIAGNOSIS — I5042 Chronic combined systolic (congestive) and diastolic (congestive) heart failure: Secondary | ICD-10-CM

## 2023-06-15 DIAGNOSIS — Z5181 Encounter for therapeutic drug level monitoring: Secondary | ICD-10-CM | POA: Diagnosis not present

## 2023-06-15 MED ORDER — EMPAGLIFLOZIN 10 MG PO TABS: 10.0000 mg | ORAL_TABLET | Freq: Every day | ORAL | 3 refills | Status: DC

## 2023-06-15 MED ORDER — SPIRONOLACTONE 25 MG PO TABS
12.5000 mg | ORAL_TABLET | Freq: Every day | ORAL | 2 refills | Status: DC
  Filled 2023-10-21 – 2023-11-29 (×2): qty 45, 90d supply, fill #0
  Filled 2024-02-23: qty 45, 90d supply, fill #1

## 2023-06-15 NOTE — Patient Instructions (Signed)
 Medication Instructions:  START SPIRONOLACTONE  25 MG 1/2 TABLET DAILY   START JARDIANCE  10 MG DAILY  *If you need a refill on your cardiac medications before your next appointment, please call your pharmacy*  Lab Work: BMET/MAGNESIUM 1 WEEK  If you have labs (blood work) drawn today and your tests are completely normal, you will receive your results only by: MyChart Message (if you have MyChart) OR A paper copy in the mail If you have any lab test that is abnormal or we need to change your treatment, we will call you to review the results.  Testing/Procedures: WILL FOLLOW UP WITH SLEEP TEAM ABOUT YOUR HOME SLEEP STUDY   Follow-Up: At [facility], you and your health needs are our priority.  As part of our continuing mission to provide you with exceptional heart care, we have created designated Provider Care Teams.  These Care Teams include your primary Cardiologist (physician) and Advanced Practice Providers (APPs -  Physician Assistants and Nurse Practitioners) who all work together to provide you with the care you need, when you need it.  We recommend signing up for the patient portal called MyChart.  Sign up information is provided on this After Visit Summary.  MyChart is used to connect with patients for Virtual Visits (Telemedicine).  Patients are able to view lab/test results, encounter notes, upcoming appointments, etc.  Non-urgent messages can be sent to your provider as well.   To learn more about what you can do with MyChart, go to [email or web address].    Your next appointment:   3 month(s)  Provider:   Lonni Nanas, MD    Other Instructions

## 2023-06-15 NOTE — Progress Notes (Signed)
 Cardiology Office Note:    Date:  06/15/2023   ID:  Tim [name], DOB January 01, 1956, MRN [id number]  PCP:  Jolee Madelin Patch, MD  Cardiologist:  Tim LITTIE Nanas, MD  Electrophysiologist:  None   Referring MD: Jolee Madelin Patch, MD   Chief Complaint  Patient presents with   Congestive Heart Failure    History of Present Illness:    Tim [name] is a 68 y.o. 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  He was referred by Dr. Jolee for evaluation of dyspnea on exertion, initially seen on 09/04/2019  TTE 12/2017 showed normal systolic function, mild left atrial dilatation, mild MR. TTE on 09/22/2019 showed LVEF 40 to 45%, global hypokinesis, grade 1 diastolic dysfunction, normal RV function, no significant valvular disease.  Calcium  score on 09/22/2019 was 8 (31st percentile).  Coronary CTA on 10/27/2019 showed nonobstructive CAD with calcified plaque in left main and mid RCA causing minimal (0 to 24%) stenosis.  CMR 12/26/19 shows LVEF improved to nomral (56%), mild RV dysfunction (EF 45%), asymmetric LVH measuring up to 14mm, basal inferolateral midwall LGE (could represent prior myocarditis, though Fabry's disease on differential given LGE location with LVH and relatively low native T1 values).  Echocardiogram 06/04/2023 showed EF 45%, moderate to severe LV dilatation, normal RV function, no significant valvular disease.  Since last clinic visit, he reports he is doing okay.  States that he continues to feel short of breath.  Denies any chest pain.  Does report he been having some lower extremity edema.  Wt Readings from Last 3 Encounters:  06/15/23 (!) 309 lb 4.8 oz (140.3 kg)  05/06/23 (!) 310 lb (140.6 kg)  07/14/22 (!) 302 lb 3.2 oz (137.1 kg)     Past Medical History:  Diagnosis Date   Arthritis    Brain tumor (HCC)    COPD (chronic obstructive  pulmonary disease) (HCC)    Diabetes mellitus without complication (HCC)    patient states he was taken off meds at last appointment with PCP and is diet controlled   Dyspnea    Ejection fraction < 50% 2021   Gout    Hypertension 08/02/2017   Pneumonia     Past Surgical History:  Procedure Laterality Date   APPLICATION OF CRANIAL NAVIGATION Left 08/30/2017   Procedure: APPLICATION OF CRANIAL NAVIGATION;  Surgeon: Unice Pac, MD;  Location: [facility] OR;  Service: Neurosurgery;  Laterality: Left;   IR IMAGING GUIDED PORT INSERTION  12/27/2017   IR REMOVAL TUN ACCESS W/ PORT W/O FL MOD SED  11/14/2019   STERIOTACTIC STIMULATOR INSERTION Left 08/30/2017   Procedure: LEFT Frontal Sterotactic Brain Biopsy with BrainLab;  Surgeon: Unice Pac, MD;  [facility] OR;  Service: Neurosurgery;  Laterality: Left;    Current Medications: Current Meds  Medication Sig   allopurinol  (ZYLOPRIM ) 300 MG tablet Take 300 mg by mouth daily.   b complex vitamins tablet Take 1 tablet by mouth daily.   blood glucose meter kit and supplies KIT Dispense based on patient and insurance preference. Use up to four times daily as directed. (FOR ICD-9 250.00, 250.01).   empagliflozin  (JARDIANCE ) 10 MG TABS tablet Take 1 tablet (10 mg total) by mouth daily before breakfast.   Insulin  Isophane & Regular Human (NOVOLIN 70/30 FLEXPEN) (70-30) 100 UNIT/ML PEN Inject 15 Units into the skin 2 (two) times daily.   Insulin  Pen Needle (PEN NEEDLES 3/16) 31G  X 5 MM MISC 1 Container by Does not apply route 2 (two) times daily.   metoprolol  succinate (TOPROL -XL) 50 MG 24 hr tablet Take 1 tablet (50 mg total) by mouth daily. Take with or immediately following a meal.   Multiple Vitamin (MULTIVITAMIN) tablet Take 1 tablet by mouth daily.   ONETOUCH VERIO test strip 1 each daily.   rosuvastatin  (CRESTOR ) 5 MG tablet Take 1 tablet (5 mg total) by mouth daily.   sacubitril -valsartan  (ENTRESTO ) 49-51 MG Take 1 tablet by mouth 2 (two) times  daily.   spironolactone  (ALDACTONE ) 25 MG tablet TAKE 1/2 TABLET DAILY   Tiotropium Bromide-Olodaterol (STIOLTO RESPIMAT ) 2.5-2.5 MCG/ACT AERS Inhale into the lungs.     Allergies:   Patient has no known allergies.   Social History   Socioeconomic History   Marital status: Significant Other    Spouse name: [name]   Number of children: Not on file   Years of education: 16   Highest education level: Bachelor's degree (e.g., BA, AB, BS)  Occupational History   Occupation: retired    Associate Professor: [employer]    Comment: disability  Tobacco Use   Smoking status: Former    Current packs/day: 0.00    Average packs/day: 2.0 packs/day for 40.0 years (80.0 ttl pk-yrs)    Types: Cigarettes    Start date: 08/10/1971    Quit date: 08/10/2011    Years since quitting: 11.8   Smokeless tobacco: Never  Vaping Use   Vaping status: Former   Quit date: 05/12/2011  Substance and Sexual Activity   Alcohol use: Yes    Alcohol/week: 14.0 standard drinks of alcohol    Types: 14 Cans of beer per week   Drug use: Never   Sexual activity: Not Currently  Other Topics Concern   Not on file  Social History Narrative   Not on file   Social Drivers of Health   Financial Resource Strain: Low Risk  (08/30/2017)   Overall Financial Resource Strain (CARDIA)    Difficulty of Paying Living Expenses: Not hard at all  Food Insecurity: No Food Insecurity (08/30/2017)   Hunger Vital Sign    Worried About Running Out of Food in the Last Year: Never true    Ran Out of Food in the Last Year: Never true  Transportation Needs: No Transportation Needs (08/30/2017)   PRAPARE - Administrator, Civil Service (Medical): No    Lack of Transportation (Non-Medical): No  Physical Activity: Inactive (08/30/2017)   Exercise Vital Sign    Days of Exercise per Week: 0 days    Minutes of Exercise per Session: 0 min  Stress: No Stress Concern Present (08/30/2017)   [employer] of Occupational Health - Occupational Stress  Questionnaire    Feeling of Stress : Only a little  Social Connections: Moderately Isolated (08/30/2017)   Social Connection and Isolation Panel [NHANES]    Frequency of Communication with Friends and Family: Once a week    Frequency of Social Gatherings with Friends and Family: Once a week    Attends Religious Services: Never    Database Administrator or Organizations: No    Attends Engineer, Structural: Never    Marital Status: Living with partner     Family History: The patient's family history includes Cancer in his brother.  ROS:   Please see the history of present illness.     All other systems reviewed and are negative.  EKGs/Labs/Other Studies Reviewed:    The  following studies were reviewed today:   EKG:   05/06/2023: Normal sinus rhythm, right bundle branch block, left anterior fascicular block, rate 63 Recent Labs: 05/06/2023: ALT 28; BUN 12; Creatinine, Ser 0.81; Hemoglobin 15.0; Magnesium 1.7; Platelets 167; Potassium 5.0; Sodium 135  Recent Lipid Panel    Component Value Date/Time   CHOL 188 05/06/2023 1223   TRIG 336 (H) 05/06/2023 1223   HDL 50 05/06/2023 1223   CHOLHDL 3.8 05/06/2023 1223   LDLCALC 83 05/06/2023 1223    Physical Exam:    VS:  BP (!) 146/70 (BP Location: Left Arm, Patient Position: Sitting)   Pulse 67   Ht 6' 4 (1.93 m)   Wt (!) 309 lb 4.8 oz (140.3 kg)   SpO2 (!) 85%   BMI 37.65 kg/m     Wt Readings from Last 3 Encounters:  06/15/23 (!) 309 lb 4.8 oz (140.3 kg)  05/06/23 (!) 310 lb (140.6 kg)  07/14/22 (!) 302 lb 3.2 oz (137.1 kg)     GEN:  in no acute distress HEENT: Normal NECK: No JVD CARDIAC: RRR, no murmurs, rubs, gallops RESPIRATORY:  Clear to auscultation without rales, wheezing or rhonchi  ABDOMEN: Soft, non-tender, non-distended MUSCULOSKELETAL: 1+ edema; No deformity  SKIN: Warm and dry NEUROLOGIC:  Alert and oriented x 3 PSYCHIATRIC:  Normal affect   ASSESSMENT:    1. Chronic combined systolic and  diastolic heart failure (HCC)   2. Essential hypertension   3. Therapeutic drug monitoring   4. Hyperlipidemia, unspecified hyperlipidemia type   5. Snoring      PLAN:    Chronic combined systolic and diastolic heart failure: EF 40 to 45% on TTE 09/22/19.  Does not appear volume overloaded on exam.  Nonischemic etiology, as coronary CTA on 10/27/2019 showed nonobstructive CAD with calcified plaque in left main and mid RCA causing minimal (0 to 24%) stenosis.  CMR 12/26/19 shows LVEF improved to normal (56%), mild RV dysfunction (EF 45%), asymmetric LVH measuring up to 14mm, basal inferolateral midwall LGE (could represent prior myocarditis, though Fabry's disease on differential given LGE location with LVH and relatively low native T1 values).  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  Echocardiogram 06/04/2023 showed EF 45%, moderate to severe LV dilatation, normal RV function, no significant valvular disease. -Continue Entresto  49-51 mg twice daily.   -Continue Toprol -XL 50 mg daily -Spironolactone  previously discontinued due to potassium at upper limits of normal.  Was not on SGLT2 at that time.  Will start spironolactone  12.5 mg daily and Jardiance  10 mg daily.  Check BMET in 1 week  Hypertension: On Entresto , Toprol -XL.  BP mildly elevated in clinic today, add spironolactone  as above  Hyperlipidemia: Recommend statin given diabetes history.  Reports had an issue with statin in the past but does not remember the details, states was years ago.  He was started on rosuvastatin  5 mg daily in December, tolerating it well  Type 2 diabetes: On insulin .  A1c 6.6% 1/28//25  Snoring/daytime somnolence: STOP-BANG 6.  Itamar sleep study ordered at prior clinic visit, states that he is still waiting on approval to use the device, will send message to sleep coordinator  RTC in 3 months  Medication  Adjustments/Labs and Tests Ordered: Current medicines are reviewed at length with the patient today.  Concerns regarding medicines are outlined above.  Orders Placed This Encounter  Procedures   Basic metabolic panel   Magnesium  Meds ordered this encounter  Medications   spironolactone  (ALDACTONE ) 25 MG tablet    Sig: TAKE 1/2 TABLET DAILY    Dispense:  45 tablet    Refill:  3   empagliflozin  (JARDIANCE ) 10 MG TABS tablet    Sig: Take 1 tablet (10 mg total) by mouth daily before breakfast.    Dispense:  90 tablet    Refill:  3    Patient Instructions  Medication Instructions:  START SPIRONOLACTONE  25 MG 1/2 TABLET DAILY   START JARDIANCE  10 MG DAILY  *If you need a refill on your cardiac medications before your next appointment, please call your pharmacy*  Lab Work: BMET/MAGNESIUM 1 WEEK  If you have labs (blood work) drawn today and your tests are completely normal, you will receive your results only by: MyChart Message (if you have MyChart) OR A paper copy in the mail If you have any lab test that is abnormal or we need to change your treatment, we will call you to review the results.  Testing/Procedures: WILL FOLLOW UP WITH SLEEP TEAM ABOUT YOUR HOME SLEEP STUDY   Follow-Up: At [facility], you and your health needs are our priority.  As part of our continuing mission to provide you with exceptional heart care, we have created designated Provider Care Teams.  These Care Teams include your primary Cardiologist (physician) and Advanced Practice Providers (APPs -  Physician Assistants and Nurse Practitioners) who all work together to provide you with the care you need, when you need it.  We recommend signing up for the patient portal called MyChart.  Sign up information is provided on this After Visit Summary.  MyChart is used to connect with patients for Virtual Visits (Telemedicine).  Patients are able to view lab/test results, encounter notes, upcoming  appointments, etc.  Non-urgent messages can be sent to your provider as well.   To learn more about what you can do with MyChart, go to [email or web address].    Your next appointment:   3 month(s)  Provider:   Lonni Nanas, MD    Other Instructions     Signed, Tim LITTIE Nanas, MD  06/15/2023 12:45 PM    [facility]

## 2023-06-15 NOTE — Telephone Encounter (Signed)
Patient had follow up visit with Dr Bjorn Pippin today Patient acquiring about the Itamar home sleep study Per patient he spoke with insurance and has been approved   Will forward to sleep team for review

## 2023-06-16 ENCOUNTER — Encounter: Payer: Self-pay | Admitting: *Deleted

## 2023-06-26 LAB — BASIC METABOLIC PANEL
BUN/Creatinine Ratio: 20 (ref 10–24)
BUN: 18 mg/dL (ref 8–27)
CO2: 27 mmol/L (ref 20–29)
Calcium: 9.4 mg/dL (ref 8.6–10.2)
Chloride: 98 mmol/L (ref 96–106)
Creatinine, Ser: 0.88 mg/dL (ref 0.76–1.27)
Glucose: 129 mg/dL — ABNORMAL HIGH (ref 70–99)
Potassium: 4.5 mmol/L (ref 3.5–5.2)
Sodium: 140 mmol/L (ref 134–144)
eGFR: 95 mL/min/{1.73_m2} (ref >59)

## 2023-06-26 LAB — MAGNESIUM: Magnesium: 1.9 mg/dL (ref 1.6–2.3)

## 2023-06-28 NOTE — Telephone Encounter (Signed)
**Note De-Identified Dailey Alberson Obfuscation** I started a Itamar-HST PA through the [employer] provider portal and am awaiting determination. Authorization [id number]

## 2023-06-29 NOTE — Telephone Encounter (Addendum)
**Note De-Identified Tim [name] Obfuscation** Ordering provider: Dr Bjorn Pippin Associated diagnoses: Snoring-R06.83 and Fatigue-R53.83 WatchPAT PA obtained on 06/29/2023 by Tim [name], Tim [place], LPN. Authorization: Letter received from [employer] stating that they have approved the pts Itamar-HST PA from 06/28/2023-09/26/2023. Authorization #: [id number] Patient notified of PIN (1234) on 06/29/2023 Tim [name] Notification Method: MyChart message. No answer on cell phone so I left a detailed message (Ok per [facility]) advising the pt of his Pin # "[phone]" as well.  Phone note routed to covering staff for follow-up.

## 2023-06-29 NOTE — Telephone Encounter (Signed)
Called and spoke to patient and [place], [place]. Patient verbalizes he has no other question about watchpat sleep study. Made patient aware to call offices for any questions.

## 2023-06-30 ENCOUNTER — Encounter (INDEPENDENT_AMBULATORY_CARE_PROVIDER_SITE_OTHER): Payer: PPO | Admitting: Cardiology

## 2023-06-30 ENCOUNTER — Encounter: Payer: Self-pay | Admitting: *Deleted

## 2023-06-30 DIAGNOSIS — G4733 Obstructive sleep apnea (adult) (pediatric): Secondary | ICD-10-CM

## 2023-07-02 ENCOUNTER — Telehealth: Payer: Self-pay

## 2023-07-02 ENCOUNTER — Ambulatory Visit: Payer: PPO | Attending: Cardiology

## 2023-07-02 DIAGNOSIS — I517 Cardiomegaly: Secondary | ICD-10-CM

## 2023-07-02 DIAGNOSIS — R0683 Snoring: Secondary | ICD-10-CM

## 2023-07-02 DIAGNOSIS — I10 Essential (primary) hypertension: Secondary | ICD-10-CM

## 2023-07-02 DIAGNOSIS — G4733 Obstructive sleep apnea (adult) (pediatric): Secondary | ICD-10-CM

## 2023-07-02 DIAGNOSIS — E785 Hyperlipidemia, unspecified: Secondary | ICD-10-CM

## 2023-07-02 DIAGNOSIS — I5042 Chronic combined systolic (congestive) and diastolic (congestive) heart failure: Secondary | ICD-10-CM

## 2023-07-02 DIAGNOSIS — R400 Somnolence: Secondary | ICD-10-CM

## 2023-07-02 NOTE — Telephone Encounter (Signed)
-----   Message from Armanda Magic sent at 07/02/2023  9:14 AM EST ----- Please let patient know that they have sleep apnea.  Recommend therapeutic CPAP titration for treatment of patient's sleep disordered breathing.

## 2023-07-02 NOTE — Procedures (Signed)
   SLEEP STUDY REPORT Patient Information Study Date: 06/30/2023 Patient Name: Tim [name] Patient ID: [id number] Birth Date: 1955/08/16 Age: 68 Gender: Male BMI: 39.1 (W=311 lb, H=6' 3'') Referring Physician: Epifanio Lesches, MD  TEST DESCRIPTION: Home sleep apnea testing was completed using the WatchPat, a Type 1 device, utilizing  peripheral arterial tonometry (PAT), chest movement, actigraphy, pulse oximetry, pulse rate, body position and snore.  AHI was calculated with apnea and hypopnea using valid sleep time as the denominator. RDI includes apneas,  hypopneas, and RERAs. The data acquired and the scoring of sleep and all associated events were performed in  accordance with the recommended standards and specifications as outlined in the AASM Manual for the Scoring of  Sleep and Associated Events 2.2.0 (2015).   FINDINGS:   1. Mild Obstructive Sleep Apnea with AHI 14.1/hr.   2. No Central Sleep Apnea with pAHIc 2.3/hr.   3. Oxygen desaturations as low as 80 %.   4. Severe snoring was present. O2 sats were < 88% for 11.4 min.   5. Total sleep time was 7 hrs and 45 min.   6. 12% of total sleep time was spent in REM sleep.   7. Normal sleep onset latency at 10 min.   8. Prolonged REM sleep onset latency at 162 min.   9. Total awakenings were 15 .  10. Arrhythmia detection: None  DIAGNOSIS: Mild Obstructive Sleep Apnea (G47.33) Nocturnal Hypoxemia  RECOMMENDATIONS: 1. Clinical correlation of these findings is necessary. The decision to treat obstructive sleep apnea (OSA) is usually  based on the presence of apnea symptoms or the presence of associated medical conditions such as Hypertension,  Congestive Heart Failure, Atrial Fibrillation or Obesity. The most common symptoms of OSA are snoring, gasping for  breath while sleeping, daytime sleepiness and fatigue.   2. Initiating apnea therapy is recommended given the presence of symptoms and/or associated conditions.   Recommend proceeding with one of the following:   a. Auto-CPAP therapy with a pressure range of 5-20cm H2O.   b. An oral appliance (OA) that can be obtained from certain dentists with expertise in sleep medicine. These are  primarily of use in non-obese patients with mild and moderate disease.   c. An ENT consultation which may be useful to look for specific causes of obstruction and possible treatment  options.   d. If patient is intolerant to PAP therapy, consider referral to ENT for evaluation for hypoglossal nerve stimulator.   3. Close follow-up is necessary to ensure success with CPAP or oral appliance therapy for maximum benefit .  4. A follow-up oximetry study on CPAP is recommended to assess the adequacy of therapy and determine the need  for supplemental oxygen or the potential need for Bi-level therapy. An arterial blood gas to determine the adequacy of  baseline ventilation and oxygenation should also be considered.  5. Healthy sleep recommendations include: adequate nightly sleep (normal 7-9 hrs/night), avoidance of caffeine after  noon and alcohol near bedtime, and maintaining a sleep environment that is cool, dark and quiet.  6. Weight loss for overweight patients is recommended. Even modest amounts of weight loss can significantly  improve the severity of sleep apnea.  7. Snoring recommendations include: weight loss where appropriate, side sleeping, and avoidance of alcohol before  bed.  8. Operation of motor vehicle should be avoided when sleepy.  Signature: Armanda Magic, MD; [facility]; Diplomat, American Board of Sleep  Medicine Electronically Signed: 07/02/2023 9:12:14 AM

## 2023-07-02 NOTE — Telephone Encounter (Signed)
 Notified patient of sleep study results and recommendations. All questions were answered and patient verbalized understanding. CPAP Titration ordered today.

## 2023-07-08 ENCOUNTER — Other Ambulatory Visit: Payer: Self-pay | Admitting: General Practice

## 2023-07-08 ENCOUNTER — Ambulatory Visit
Admission: RE | Admit: 2023-07-08 | Discharge: 2023-07-08 | Disposition: A | Discharge status: Home / Self Care | Payer: PPO | Source: Ambulatory Visit | Attending: Internal Medicine | Admitting: Internal Medicine

## 2023-07-08 DIAGNOSIS — C8339 Primary central nervous system lymphoma: Secondary | ICD-10-CM

## 2023-07-08 MED ORDER — GADOPICLENOL 0.5 MMOL/ML IV SOLN
10.0000 mL | Freq: Once | INTRAVENOUS | Status: AC | PRN
  Administered 2023-07-08: 10 mL via INTRAVENOUS

## 2023-07-13 ENCOUNTER — Inpatient Hospital Stay: Payer: PPO | Attending: Internal Medicine | Admitting: Internal Medicine

## 2023-07-13 VITALS — BP 146/63 | HR 61 | Temp 98.1°F | Resp 18 | Wt 304.2 lb

## 2023-07-13 DIAGNOSIS — Z87891 Personal history of nicotine dependence: Secondary | ICD-10-CM | POA: Diagnosis not present

## 2023-07-13 DIAGNOSIS — Z79899 Other long term (current) drug therapy: Secondary | ICD-10-CM | POA: Diagnosis not present

## 2023-07-13 DIAGNOSIS — C8339 Primary central nervous system lymphoma: Secondary | ICD-10-CM | POA: Insufficient documentation

## 2023-07-13 DIAGNOSIS — J449 Chronic obstructive pulmonary disease, unspecified: Secondary | ICD-10-CM | POA: Diagnosis not present

## 2023-07-13 NOTE — Progress Notes (Signed)
 [facility] at [facility] [address]  [place], [place] [address] [phone]   Interval Evaluation  Date of Service: 07/13/23 Patient Name: Tim [name] Patient MRN: [id number] Patient DOB: 08-13-1955 Provider: Henreitta Leber, MD  Identifying Statement:  Tim [name] is a 68 y.o. male with left frontal  CNS lymphoma  Treatment Summary: 08/30/17: L frontal stereotactic biopsy with Dr. Venetia Maxon 01/31/18: Completes 3rd cycle of HD-MTX and Rituximab 07/26/18: Completes 5th cycle of 5-day Temodar consolidation  Interval History:  [name] presents today for follow up after recent MRI brain.  No clinical changes reported today from neurologic standpoint.  Shortness of breath is worsening gradually, using oxygen at night.  He continues to follow with vitreal specialist at [facility], has intervention for retinal scarring planned for next week. Otherwise denies progressive neurologic complaints, no language issues.  No seizures.  Medications: Current Outpatient Medications on File Prior to Visit  Medication Sig Dispense Refill   allopurinol (ZYLOPRIM) 300 MG tablet Take 300 mg by mouth daily.     b complex vitamins tablet Take 1 tablet by mouth daily.     blood glucose meter kit and supplies KIT Dispense based on patient and insurance preference. Use up to four times daily as directed. (FOR ICD-9 250.00, 250.01). 1 each 0   empagliflozin (JARDIANCE) 10 MG TABS tablet Take 1 tablet (10 mg total) by mouth daily before breakfast. 90 tablet 3   Insulin Isophane & Regular Human (NOVOLIN 70/30 FLEXPEN) (70-30) 100 UNIT/ML PEN Inject 15 Units into the skin 2 (two) times daily. 15 mL 11   Insulin Pen Needle (PEN NEEDLES 3/16") 31G X 5 MM MISC 1 Container by Does not apply route 2 (two) times daily. 100 each 0   metoprolol succinate (TOPROL-XL) 50 MG 24 hr tablet TAKE 1 TABLET BY MOUTH DAILY WITH OR IMMEDIATELY FOLLOWING A MEAL 90 tablet 3   Multiple  Vitamin (MULTIVITAMIN) tablet Take 1 tablet by mouth daily.     ONETOUCH VERIO test strip 1 each daily.     rosuvastatin (CRESTOR) 5 MG tablet Take 1 tablet (5 mg total) by mouth daily. 90 tablet 3   sacubitril-valsartan (ENTRESTO) 49-51 MG Take 1 tablet by mouth 2 (two) times daily. 180 tablet 3   spironolactone (ALDACTONE) 25 MG tablet TAKE 1/2 TABLET DAILY 45 tablet 3   Tiotropium Bromide-Olodaterol (STIOLTO RESPIMAT) 2.5-2.5 MCG/ACT AERS Inhale into the lungs.     Current Facility-Administered Medications on File Prior to Visit  Medication Dose Route Frequency Provider Last Rate Last Admin   heparin lock flush 100 unit/mL  500 Units Intracatheter Once Arlie Posch, Georgeanna Lea, MD       sodium chloride flush (NS) 0.9 % injection 10 mL  10 mL Intracatheter Once Henreitta Leber, MD        Allergies: No Known Allergies   Past Medical History:  Past Medical History:  Diagnosis Date   Arthritis    Brain tumor (HCC)    COPD (chronic obstructive pulmonary disease) (HCC)    Diabetes mellitus without complication (HCC)    patient states he was taken off meds at last appointment with PCP and is diet controlled   Dyspnea    Ejection fraction < 50% 2021   Gout    Hypertension 08/02/2017   Pneumonia    Past Surgical History: none    Social History:  Social History   Socioeconomic History   Marital status: Significant Other  Spouse name: [name]   Number of children: Not on file   Years of education: 16   Highest education level: Bachelor's degree (e.g., BA, AB, BS)  Occupational History   Occupation: retired    Associate Professor: [employer]    Comment: disability  Tobacco Use   Smoking status: Former    Current packs/day: 0.00    Average packs/day: 2.0 packs/day for 40.0 years (80.0 ttl pk-yrs)    Types: Cigarettes    Start date: 08/10/1971    Quit date: 08/10/2011    Years since quitting: 11.9   Smokeless tobacco: Never  Vaping Use   Vaping status: Former   Quit date: 05/12/2011  Substance and  Sexual Activity   Alcohol use: Yes    Alcohol/week: 14.0 standard drinks of alcohol    Types: 14 Cans of beer per week   Drug use: Never   Sexual activity: Not Currently  Other Topics Concern   Not on file  Social History Narrative   Not on file   Social Drivers of Health   Financial Resource Strain: Low Risk  (08/30/2017)   Overall Financial Resource Strain (CARDIA)    Difficulty of Paying Living Expenses: Not hard at all  Food Insecurity: No Food Insecurity (08/30/2017)   Hunger Vital Sign    Worried About Running Out of Food in the Last Year: Never true    Ran Out of Food in the Last Year: Never true  Transportation Needs: No Transportation Needs (08/30/2017)   PRAPARE - Administrator, Civil Service (Medical): No    Lack of Transportation (Non-Medical): No  Physical Activity: Inactive (08/30/2017)   Exercise Vital Sign    Days of Exercise per Week: 0 days    Minutes of Exercise per Session: 0 min  Stress: No Stress Concern Present (08/30/2017)   [employer] of Occupational Health - Occupational Stress Questionnaire    Feeling of Stress : Only a little  Social Connections: Moderately Isolated (08/30/2017)   Social Connection and Isolation Panel [NHANES]    Frequency of Communication with Friends and Family: Once a week    Frequency of Social Gatherings with Friends and Family: Once a week    Attends Religious Services: Never    Database administrator or Organizations: No    Attends Banker Meetings: Never    Marital Status: Living with partner  Intimate Partner Violence: Not At Risk (08/30/2017)   Humiliation, Afraid, Rape, and Kick questionnaire    Fear of Current or Ex-Partner: No    Emotionally Abused: No    Physically Abused: No    Sexually Abused: No   Family History:  Family History  Problem Relation Age of Onset   Cancer Brother     Review of Systems: Constitutional: Denies fevers, chills or abnormal weight loss Eyes: Denies  blurriness of vision Ears, nose, mouth, throat, and face: Denies mucositis or sore throat Respiratory: denies sob Cardiovascular: Denies palpitation, chest discomfort or lower extremity swelling Gastrointestinal:  Denies nausea, constipation, diarrhea GU: Denies dysuria or incontinence Skin: Denies abnormal skin rashes Neurological: Per HPI Musculoskeletal: Denies joint pain, back or neck discomfort. No decrease in ROM Behavioral/Psych: Denies anxiety, disturbance in thought content, and mood instability  Physical Exam: Vitals:   07/13/23 1042  BP: (!) 146/63  Pulse: 61  Resp: 18  Temp: 98.1 F (36.7 C)  SpO2: 91%   KPS: 80. General: Alert, cooperative, pleasant, in no acute distress Head: Biopsy scar noted, dry and intact. EENT:  No conjunctival injection or scleral icterus. Oral mucosa moist Lungs: Resp effort increased Cardiac: Regular rate and rhythm Abdomen: Soft, non-distended abdomen Skin: No rashes cyanosis or petechiae. Extremities: No clubbing or edema  Neurologic Exam: Mental Status: Awake, alert, attentive to examiner. Oriented to self and environment. Language fluent with intact comprehension, repetition, reading. Cranial Nerves: Visual acuity is grossly normal. Visual fields are full. Extra-ocular movements intact. No ptosis. Face is symmetric, tongue midline. Motor: Tone and bulk are normal. Full power in arms and legs. Reflexes are symmetric, no pathologic reflexes present. Intact finger to nose bilaterally Sensory: Intact to light touch and temperature Gait: Normal, independent   Labs: I have reviewed the data as listed    Component Value Date/Time   NA 140 06/25/2023 1539   K 4.5 06/25/2023 1539   CL 98 06/25/2023 1539   CO2 27 06/25/2023 1539   GLUCOSE 129 (H) 06/25/2023 1539   GLUCOSE 191 (H) 12/20/2018 0929   BUN 18 06/25/2023 1539   CREATININE 0.88 06/25/2023 1539   CREATININE 0.97 12/20/2018 0929   CALCIUM 9.4 06/25/2023 1539   PROT 6.6  05/06/2023 1223   ALBUMIN 3.9 05/06/2023 1223   AST 33 05/06/2023 1223   AST 50 (H) 12/20/2018 0929   ALT 28 05/06/2023 1223   ALT 59 (H) 12/20/2018 0929   ALKPHOS 93 05/06/2023 1223   BILITOT 0.4 05/06/2023 1223   BILITOT 0.5 12/20/2018 0929   GFRNONAA 93 12/29/2019 1619   GFRNONAA >60 12/20/2018 0929   GFRAA 107 12/29/2019 1619   GFRAA >60 12/20/2018 0929   Lab Results  Component Value Date   WBC 7.1 05/06/2023   NEUTROABS 4.5 05/06/2023   HGB 15.0 05/06/2023   HCT 45.7 05/06/2023   MCV 97 05/06/2023   PLT 167 05/06/2023    Imaging:  [facility] Clinician Interpretation: I have personally reviewed the MRI images from 09/25/20.  My interpretation, in the context of the patient's clinical presentation, is stable disease  Pending official radiology interpretation MR BRAIN W WO CONTRAST Result Date: 07/08/2023 CLINICAL DATA:  Follow-up SRS of intracranial metastasis EXAM: MRI HEAD WITHOUT AND WITH CONTRAST TECHNIQUE: Multiplanar, multiecho pulse sequences of the brain and surrounding structures were obtained without and with intravenous contrast. CONTRAST:  10 mL of Vueway intravenous contrast COMPARISON:  MRI head 07/05/2022, 10/09/2021 FINDINGS: Brain: Redemonstrated postsurgical changes of left parietal craniotomy. Similar appearance of underlying encephalomalacia and surrounding gliosis within the underlying posterior left frontal lobe. Similar associated susceptibility suggestive of chronic broad products. No significant edema or mass effect. No associated enhancement. Similar focus of encephalomalacia and gliosis anteriorly within the parasagittal left frontal lobe with mild associated susceptibility. No associated enhancement. No new enhancing lesions. No acute infarct. No acute intracranial hemorrhage. Minimal scattered areas of white matter signal abnormality likely within normal limits for patient's age. No midline shift. Similar mild parenchymal volume loss. The basilar cisterns are  patent. No extra-axial fluid collections. Ventricles: Stable size and configuration. Vascular: Skull base flow voids are visualized. Skull and upper cervical spine: No focal abnormality. Sinuses/Orbits: Left lens replacement. Orbits otherwise symmetric. Mucosal thickening in the maxillary sinuses, left greater than right. Other: Trace fluid in the left mastoid air cells. IMPRESSION: 1. Stable postsurgical changes of left parietal craniotomy. Similar appearance of underlying encephalomalacia and gliosis within the left frontal lobe. No associated enhancement. 2. No new intracranial lesions. Electronically Signed   By: Emily Filbert M.D.   On: 07/08/2023 16:02     Assessment/Plan 1. Primary CNS  lymphoma [facility])  Mr. Huston is clinically and radiographically stable today.  No new or progressive changes.  He is now 5 years removed from lymphoma treatment.  Likelihood of tumor recurrence is very low at this point.    He will get back in to see his COPD specialist.  He will con't to follow with vitreal specialist at [facility] every 6 months.  Lina Sayre should return to clinic in 24 months following next brain MRI, or sooner as needed.  We appreciate the opportunity to participate in the care of JACEN CARLINI.   I have spent a total of 40 minutes of face-to-face and non-face-to-face time, excluding clinical staff time, preparing to see patient, ordering tests and/or medications, counseling the patient, and independently interpreting results and communicating results to the patient/family/caregiver    Henreitta Leber, MD Medical Director of Neuro-Oncology [facility] at [place] [place] 07/13/23 10:25 AM

## 2023-09-13 ENCOUNTER — Telehealth: Payer: Self-pay

## 2023-09-13 NOTE — Telephone Encounter (Signed)
**Note De-Identified Consuelo Suthers Obfuscation** CPAP Titration PA pending [facility] review. Outpatient Authorization [id number]

## 2023-09-16 NOTE — Telephone Encounter (Signed)
**Note De-Identified Cathi Hazan Obfuscation** Letter received from [facility] stating that they have approved the pts CPAP Titration from 09/13/23-12/12/23. Authorization #: [id number]  I have transferred the order to the sleep lab so they can contact him to schedule the test.

## 2023-09-21 NOTE — Progress Notes (Unsigned)
 Cardiology Office Note:    Date:  09/22/2023   ID:  Tim [name], DOB March 08, 1956, MRN [id number]  PCP:  Jacqulyne Maxim, MD  Cardiologist:  Wendie Hamburg, MD  Electrophysiologist:  None   Referring MD: Jacqulyne Maxim, MD   Chief Complaint  Patient presents with   Congestive Heart Failure    History of Present Illness:    Tim [name] is a 68 y.o. 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  He was referred by Dr. Suszanne Eriksson for evaluation of dyspnea on exertion, initially seen on 09/04/2019  TTE 12/2017 showed normal systolic function, mild left atrial dilatation, mild MR. TTE on 09/22/2019 showed LVEF 40 to 45%, global hypokinesis, grade 1 diastolic dysfunction, normal RV function, no significant valvular disease.  Calcium  score on 09/22/2019 was 8 (31st percentile).  Coronary CTA on 10/27/2019 showed nonobstructive CAD with calcified plaque in left main and mid RCA causing minimal (0 to 24%) stenosis.  CMR 12/26/19 shows LVEF improved to nomral (56%), mild RV dysfunction (EF 45%), asymmetric LVH measuring up to 14mm, basal inferolateral midwall LGE (could represent prior myocarditis, though Fabry's disease on differential given LGE location with LVH and relatively low native T1 values).  Echocardiogram 06/04/2023 showed EF 45%, moderate to severe LV dilatation, normal RV function, no significant valvular disease.  Since last clinic visit, he reports he is doing okay.  He continues to have shortness of breath, sees pulmonology tomorrow. Denies any chest pain, lightheadedness, syncope, or palpitations. Does report some lower extremity edema.  Wt Readings from Last 3 Encounters:  09/22/23 (!) 302 lb 11.2 oz (137.3 kg)  07/13/23 (!) 304 lb 3.2 oz (138 kg)  06/15/23 (!) 309 lb 4.8 oz (140.3 kg)     Past Medical History:  Diagnosis Date   Arthritis     Brain tumor (HCC)    COPD (chronic obstructive pulmonary disease) (HCC)    Diabetes mellitus without complication (HCC)    patient states he was taken off meds at last appointment with PCP and is diet controlled   Dyspnea    Ejection fraction < 50% 2021   Gout    Hypertension 08/02/2017   Pneumonia     Past Surgical History:  Procedure Laterality Date   APPLICATION OF CRANIAL NAVIGATION Left 08/30/2017   Procedure: APPLICATION OF CRANIAL NAVIGATION;  Surgeon: Manya Sells, MD;  Location: [facility] OR;  Service: Neurosurgery;  Laterality: Left;   IR IMAGING GUIDED PORT INSERTION  12/27/2017   IR REMOVAL TUN ACCESS W/ PORT W/O FL MOD SED  11/14/2019   STERIOTACTIC STIMULATOR INSERTION Left 08/30/2017   Procedure: LEFT Frontal Sterotactic Brain Biopsy with BrainLab;  Surgeon: Manya Sells, MD;  Location: [facility] OR;  Service: Neurosurgery;  Laterality: Left;    Current Medications: Current Meds  Medication Sig   allopurinol  (ZYLOPRIM ) 300 MG tablet Take 300 mg by mouth daily.   b complex vitamins tablet Take 1 tablet by mouth daily.   blood glucose meter kit and supplies KIT Dispense based on patient and insurance preference. Use up to four times daily as directed. (FOR ICD-9 250.00, 250.01).   Insulin  Isophane & Regular Human (NOVOLIN 70/30 FLEXPEN) (70-30) 100 UNIT/ML PEN Inject 15 Units into the skin 2 (two) times daily.   Insulin  Pen Needle (PEN NEEDLES 3/16") 31G X 5 MM MISC 1 Container by Does not apply route 2 (two) times daily.  metoprolol  succinate (TOPROL -XL) 50 MG 24 hr tablet TAKE 1 TABLET BY MOUTH DAILY WITH OR IMMEDIATELY FOLLOWING A MEAL   Multiple Vitamin (MULTIVITAMIN) tablet Take 1 tablet by mouth daily.   ONETOUCH VERIO test strip 1 each daily.   sacubitril -valsartan  (ENTRESTO ) 49-51 MG Take 1 tablet by mouth 2 (two) times daily.   spironolactone  (ALDACTONE ) 25 MG tablet TAKE 1/2 TABLET DAILY   TRELEGY ELLIPTA 200-62.5-25 MCG/ACT AEPB Inhale 1 puff into the lungs daily.    [DISCONTINUED] empagliflozin  (JARDIANCE ) 10 MG TABS tablet Take 1 tablet (10 mg total) by mouth daily before breakfast.     Allergies:   Patient has no known allergies.   Social History   Socioeconomic History   Marital status: Significant Other    Spouse name: [name]   Number of children: Not on file   Years of education: 16   Highest education level: Bachelor's degree (e.g., BA, AB, BS)  Occupational History   Occupation: retired    Associate Professor: [employer]    Comment: disability  Tobacco Use   Smoking status: Former    Current packs/day: 0.00    Average packs/day: 2.0 packs/day for 40.0 years (80.0 ttl pk-yrs)    Types: Cigarettes    Start date: 08/10/1971    Quit date: 08/10/2011    Years since quitting: 12.1   Smokeless tobacco: Never  Vaping Use   Vaping status: Former   Quit date: 05/12/2011  Substance and Sexual Activity   Alcohol use: Yes    Alcohol/week: 14.0 standard drinks of alcohol    Types: 14 Cans of beer per week   Drug use: Never   Sexual activity: Not Currently  Other Topics Concern   Not on file  Social History Narrative   Not on file   Social Drivers of Health   Financial Resource Strain: Low Risk  (08/30/2017)   Overall Financial Resource Strain (CARDIA)    Difficulty of Paying Living Expenses: Not hard at all  Food Insecurity: No Food Insecurity (08/30/2017)   Hunger Vital Sign    Worried About Running Out of Food in the Last Year: Never true    Ran Out of Food in the Last Year: Never true  Transportation Needs: No Transportation Needs (08/30/2017)   PRAPARE - Administrator, Civil Service (Medical): No    Lack of Transportation (Non-Medical): No  Physical Activity: Inactive (08/30/2017)   Exercise Vital Sign    Days of Exercise per Week: 0 days    Minutes of Exercise per Session: 0 min  Stress: No Stress Concern Present (08/30/2017)   [employer] of Occupational Health - Occupational Stress Questionnaire    Feeling of Stress : Only a little   Social Connections: Moderately Isolated (08/30/2017)   Social Connection and Isolation Panel [NHANES]    Frequency of Communication with Friends and Family: Once a week    Frequency of Social Gatherings with Friends and Family: Once a week    Attends Religious Services: Never    Database administrator or Organizations: No    Attends Engineer, structural: Never    Marital Status: Living with partner     Family History: The patient's family history includes Cancer in his brother.  ROS:   Please see the history of present illness.     All other systems reviewed and are negative.  EKGs/Labs/Other Studies Reviewed:    The following studies were reviewed today:   EKG:   05/06/2023: Normal sinus rhythm, right bundle  branch block, left anterior fascicular block, rate 63 Recent Labs: 05/06/2023: ALT 28; Hemoglobin 15.0; Platelets 167 06/25/2023: BUN 18; Creatinine, Ser 0.88; Magnesium 1.9; Potassium 4.5; Sodium 140  Recent Lipid Panel    Component Value Date/Time   CHOL 188 05/06/2023 1223   TRIG 336 (H) 05/06/2023 1223   HDL 50 05/06/2023 1223   CHOLHDL 3.8 05/06/2023 1223   LDLCALC 83 05/06/2023 1223    Physical Exam:    VS:  BP 122/62   Pulse 68   Ht 6\' 4"  (1.93 m)   Wt (!) 302 lb 11.2 oz (137.3 kg)   SpO2 (!) 87%   BMI 36.85 kg/m     Wt Readings from Last 3 Encounters:  09/22/23 (!) 302 lb 11.2 oz (137.3 kg)  07/13/23 (!) 304 lb 3.2 oz (138 kg)  06/15/23 (!) 309 lb 4.8 oz (140.3 kg)     GEN:  in no acute distress HEENT: Normal NECK: No JVD CARDIAC: RRR, no murmurs, rubs, gallops RESPIRATORY:  Clear to auscultation without rales, wheezing or rhonchi  ABDOMEN: Soft, non-tender, non-distended MUSCULOSKELETAL: 1+ edema; No deformity  SKIN: Warm and dry NEUROLOGIC:  Alert and oriented x 3 PSYCHIATRIC:  Normal affect   ASSESSMENT:    1. Chronic combined systolic and diastolic heart failure (HCC)   2. Essential hypertension   3. Hyperlipidemia,  unspecified hyperlipidemia type      PLAN:    Chronic combined systolic and diastolic heart failure: EF 40 to 45% on TTE 09/22/19.  Nonischemic etiology, as coronary CTA on 10/27/2019 showed nonobstructive CAD with calcified plaque in left main and mid RCA causing minimal (0 to 24%) stenosis.  CMR 12/26/19 shows LVEF improved to normal (56%), mild RV dysfunction (EF 45%), asymmetric LVH measuring up to 14mm, basal inferolateral midwall LGE (could represent prior myocarditis, though Fabry's disease on differential given LGE location with LVH and relatively low native T1 values).  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  Echocardiogram 06/04/2023 showed EF 45%, moderate to severe LV dilatation, normal RV function, no significant valvular disease. - Continue Entresto  49-51 mg twice daily.   - Continue Toprol -XL 50 mg daily - Continue spironolactone  25 mg daily - Continue Jardiance  10 mg daily - Check BMET, magnesium  Hypertension: On Entresto , Toprol -XL, spironolactone   Hyperlipidemia: Recommend statin given diabetes history.  Reports had an issue with statin in the past but does not remember the details, states was years ago.  He was started on rosuvastatin  5 mg daily in December, tolerating it well.  Check lipid panel  Type 2 diabetes: On insulin .  A1c 6.6% 1/28//25  OSA: Positive sleep study 06/2023, starting CPAP  RTC in 6 months  Medication Adjustments/Labs and Tests Ordered: Current medicines are reviewed at length with the patient today.  Concerns regarding medicines are outlined above.  Orders Placed This Encounter  Procedures   Comprehensive metabolic panel with GFR   Lipid panel   Magnesium   Meds ordered this encounter  Medications   empagliflozin  (JARDIANCE ) 10 MG TABS tablet    Sig: Take 1 tablet (10 mg total) by mouth daily before breakfast.    Dispense:  100 tablet     Refill:  2    Patient Instructions  Medication Instructions:  Your physician recommends that you continue on your current medications as directed. Please refer to the Current Medication list given to you today.   Lab Work: Your physician recommends  that you return for lab work: LIPIDS, CMET and MAG  Follow-Up: Your next appointment:   6 month(s)  Provider:   Wendie Hamburg, MD       Signed, Wendie Hamburg, MD  09/22/2023 1:20 PM    [facility]

## 2023-09-22 ENCOUNTER — Ambulatory Visit (HOSPITAL_BASED_OUTPATIENT_CLINIC_OR_DEPARTMENT_OTHER): Payer: PPO | Admitting: Cardiology

## 2023-09-22 ENCOUNTER — Encounter (HOSPITAL_BASED_OUTPATIENT_CLINIC_OR_DEPARTMENT_OTHER): Payer: Self-pay | Admitting: Cardiology

## 2023-09-22 VITALS — BP 122/62 | HR 68 | Ht 76.0 in | Wt 302.7 lb

## 2023-09-22 DIAGNOSIS — I5042 Chronic combined systolic (congestive) and diastolic (congestive) heart failure: Secondary | ICD-10-CM | POA: Diagnosis not present

## 2023-09-22 DIAGNOSIS — E785 Hyperlipidemia, unspecified: Secondary | ICD-10-CM | POA: Diagnosis not present

## 2023-09-22 DIAGNOSIS — I10 Essential (primary) hypertension: Secondary | ICD-10-CM | POA: Diagnosis not present

## 2023-09-22 MED ORDER — EMPAGLIFLOZIN 10 MG PO TABS
10.0000 mg | ORAL_TABLET | Freq: Every day | ORAL | 2 refills | Status: DC
  Filled 2023-10-21: qty 100, 100d supply, fill #0

## 2023-09-22 NOTE — Patient Instructions (Addendum)
 Medication Instructions:  Your physician recommends that you continue on your current medications as directed. Please refer to the Current Medication list given to you today.   Lab Work: Your physician recommends that you return for lab work: LIPIDS, CMET and MAG  Follow-Up: Your next appointment:   6 month(s)  Provider:   Wendie Hamburg, MD

## 2023-09-23 ENCOUNTER — Ambulatory Visit: Payer: Self-pay | Admitting: Cardiology

## 2023-09-23 ENCOUNTER — Encounter: Payer: Self-pay | Admitting: Pulmonary Disease

## 2023-09-23 ENCOUNTER — Ambulatory Visit: Admitting: Pulmonary Disease

## 2023-09-23 VITALS — BP 143/76 | HR 61 | Ht 75.0 in | Wt 303.2 lb

## 2023-09-23 DIAGNOSIS — J9611 Chronic respiratory failure with hypoxia: Secondary | ICD-10-CM | POA: Diagnosis not present

## 2023-09-23 DIAGNOSIS — J449 Chronic obstructive pulmonary disease, unspecified: Secondary | ICD-10-CM

## 2023-09-23 LAB — COMPREHENSIVE METABOLIC PANEL WITH GFR
ALT: 16 IU/L (ref 0–44)
AST: 21 IU/L (ref 0–40)
Albumin: 4.2 g/dL (ref 3.9–4.9)
Alkaline Phosphatase: 92 IU/L (ref 44–121)
BUN/Creatinine Ratio: 17 (ref 10–24)
BUN: 15 mg/dL (ref 8–27)
Bilirubin Total: 0.3 mg/dL (ref 0.0–1.2)
CO2: 27 mmol/L (ref 20–29)
Calcium: 9.2 mg/dL (ref 8.6–10.2)
Chloride: 96 mmol/L (ref 96–106)
Creatinine, Ser: 0.89 mg/dL (ref 0.76–1.27)
Globulin, Total: 2.5 g/dL (ref 1.5–4.5)
Glucose: 78 mg/dL (ref 70–99)
Potassium: 4.9 mmol/L (ref 3.5–5.2)
Sodium: 138 mmol/L (ref 134–144)
Total Protein: 6.7 g/dL (ref 6.0–8.5)
eGFR: 94 mL/min/{1.73_m2} (ref >59)

## 2023-09-23 LAB — LIPID PANEL
Chol/HDL Ratio: 2.9 ratio (ref 0.0–5.0)
Cholesterol, Total: 163 mg/dL (ref 100–199)
HDL: 56 mg/dL (ref >39)
LDL Chol Calc (NIH): 78 mg/dL (ref 0–99)
Triglycerides: 171 mg/dL — ABNORMAL HIGH (ref 0–149)
VLDL Cholesterol Cal: 29 mg/dL (ref 5–40)

## 2023-09-23 LAB — MAGNESIUM: Magnesium: 2.1 mg/dL (ref 1.6–2.3)

## 2023-09-23 MED ORDER — STIOLTO RESPIMAT 2.5-2.5 MCG/ACT IN AERS
2.0000 | INHALATION_SPRAY | Freq: Every day | RESPIRATORY_TRACT | 3 refills | Status: AC
  Filled 2023-10-21: qty 12, 90d supply, fill #0
  Filled 2024-01-12: qty 12, 90d supply, fill #1
  Filled 2024-04-07: qty 12, 90d supply, fill #2

## 2023-09-23 NOTE — Patient Instructions (Signed)
 It is nice to meet you  Lets go back to [employer].  If you find the breathing is worse on the Stiolto supply to Trelegy, send me a message and I can switch it back to Trelegy.  The new nebulized medicines to help with COPD is called Ohtuvayre .  Feel free to discuss with your insurance to see if you get an estimate on the cost.  If you desire to move forward with this to me a message and we will get paperwork out to you.  This is a specialty medicine so it requires special paperwork and a specialty pharmacy.  We will handle all that on our end.  I would say your goal oxygen  saturation is 90%.  Think about wearing it more when you are outside of the house to see if it helps you be more active outside of the house.  Return to clinic in 6 months or sooner as needed with Dr. Marygrace Snellen

## 2023-09-23 NOTE — Progress Notes (Signed)
 @Patient  ID: Rolene Client, male    DOB: 09-26-1955, 68 y.o.   MRN: [id number]  Chief Complaint  Patient presents with   Consult    Referring provider: Jacqulyne Maxim, MD  HPI:   68 y.o. man whom we are seeing for evaluation of COPD.  Note from referring provider reviewed.  Multiple prior pulmonary notes from [facility] reviewed.  Diagnosed COPD some years ago.  PFTs in our system in 2019 confirm this.  Repeat PFTs 2021 confirmed this.  Both with severe obstruction, FEV1 less than 50%.  You Stiolto for a long time.  Not sure it helped much.  Has been on Trelegy for about 1 year.  Does not think it really helps either.  Is prescribed oxygen .  Wears it at night.  Sometimes in the house.  He rarely if ever wears it when he leaves the house.  It seems like he qualified for exertional oxygen  in the past.  His oxygen  saturation today is 88% walking him.  We discussed tragedies including using oxygen  more frequently especially outside of the house as if it helps his exercise endurance, exercise tolerance.  We discussed inhaler therapies and what we should try in the future.  He denies any significant exacerbations in the past.  Removed last time he got prednisone and antibiotics for a respiratory issue.  We discussed new therapies as well.  Questionaires / Pulmonary Flowsheets:   ACT:      No data to display          MMRC:     No data to display          Epworth:      No data to display          Tests:   FENO:  No results found for: "NITRICOXIDE"  PFT:    Latest Ref Rng & Units 09/24/2017   11:08 AM  PFT Results  FVC-Pre L 3.35   FVC-Predicted Pre % 59   FVC-Post L 3.89   FVC-Predicted Post % 69   Pre FEV1/FVC % % 50   Post FEV1/FCV % % 54   FEV1-Pre L 1.68   FEV1-Predicted Pre % 39   FEV1-Post L 2.08   DLCO uncorrected ml/min/mmHg 19.13   DLCO UNC% % 48   DLCO corrected ml/min/mmHg 19.53   DLCO COR %Predicted % 49   DLVA Predicted % 64    2019 personally viewed interpreted as severe fixed obstruction no significant bronchodilator response 2021 [facility] severe COPD FEV1 35%  WALK:      No data to display          Imaging: Personally reviewed as per EMR in discussion this note No results found.  Lab Results: Personally reviewed CBC    Component Value Date/Time   WBC 7.1 05/06/2023 1223   WBC 6.2 11/14/2019 1015   RBC 4.73 05/06/2023 1223   RBC 5.24 11/14/2019 1015   HGB 15.0 05/06/2023 1223   HCT 45.7 05/06/2023 1223   PLT 167 05/06/2023 1223   MCV 97 05/06/2023 1223   MCH 31.7 05/06/2023 1223   MCH 32.3 11/14/2019 1015   MCHC 32.8 05/06/2023 1223   MCHC 32.8 11/14/2019 1015   RDW 12.7 05/06/2023 1223   LYMPHSABS 1.6 05/06/2023 1223   MONOABS 0.7 11/14/2019 1015   EOSABS 0.2 05/06/2023 1223   BASOSABS 0.1 05/06/2023 1223    BMET    Component Value Date/Time   NA 138 09/22/2023 1304  K 4.9 09/22/2023 1304   CL 96 09/22/2023 1304   CO2 27 09/22/2023 1304   GLUCOSE 78 09/22/2023 1304   GLUCOSE 191 (H) 12/20/2018 0929   BUN 15 09/22/2023 1304   CREATININE 0.89 09/22/2023 1304   CREATININE 0.97 12/20/2018 0929   CALCIUM  9.2 09/22/2023 1304   GFRNONAA 93 12/29/2019 1619   GFRNONAA >60 12/20/2018 0929   GFRAA 107 12/29/2019 1619   GFRAA >60 12/20/2018 0929    BNP No results found for: "BNP"  ProBNP    Component Value Date/Time   PROBNP 49.0 09/15/2017 1730    Specialty Problems       Pulmonary Problems   DOE (dyspnea on exertion)   Spirometry 08/24/2017  FEV1 1.68 (40%)  Ratio 53 with flattened early portion of f/v loop on acei > d/c'd  And max rec rx for gerd         COPD  GOLD III    Quit smoking 2013 Spirometry 08/24/2017  FEV1 1.68 (40%)  Ratio 53 with flattened early portion of f/v loop on acei  - trial off acei 08/24/2017  - PFT's  09/24/2017  FEV1  2.08 (49 % ) ratio 54  p 23 % improvement from saba p nothing prior to study with DLCO  48/49c % corrects to 64  % for  alv volume   - 09/24/2017  After extensive coaching inhaler device  effectiveness =    75% > try symbicort  160 2bid       Chronic respiratory failure with hypoxia (HCC)    No Known Allergies  Immunization History  Administered Date(s) Administered   Influenza, High Dose Seasonal PF 04/01/2023   Influenza,inj,Quad PF,6+ Mos 02/18/2018, 01/17/2019   PFIZER(Purple Top)SARS-COV-2 Vaccination 07/23/2019, 08/12/2019, 01/16/2020   Pfizer Covid-19 Vaccine Bivalent Booster 72yrs & up 04/07/2021   Pfizer(Comirnaty)Fall Seasonal Vaccine 12 years and older 04/26/2022, 04/01/2023   Rsv, Bivalent, Protein Subunit Rsvpref,pf Pattricia Bores) 02/25/2023   Unspecified SARS-COV-2 Vaccination 10/28/2020    Past Medical History:  Diagnosis Date   Arthritis    Brain tumor (HCC)    COPD (chronic obstructive pulmonary disease) (HCC)    Diabetes mellitus without complication (HCC)    patient states he was taken off meds at last appointment with PCP and is diet controlled   Dyspnea    Ejection fraction < 50% 2021   Gout    Hypertension 08/02/2017   Pneumonia     Tobacco History: Social History   Tobacco Use  Smoking Status Former   Current packs/day: 0.00   Average packs/day: 2.0 packs/day for 40.0 years (80.0 ttl pk-yrs)   Types: Cigarettes   Start date: 08/10/1971   Quit date: 08/10/2011   Years since quitting: 12.1  Smokeless Tobacco Never   Counseling given: Not Answered   Continue to not smoke  Outpatient Encounter Medications as of 09/23/2023  Medication Sig   allopurinol  (ZYLOPRIM ) 300 MG tablet Take 300 mg by mouth daily.   b complex vitamins tablet Take 1 tablet by mouth daily.   blood glucose meter kit and supplies KIT Dispense based on patient and insurance preference. Use up to four times daily as directed. (FOR ICD-9 250.00, 250.01).   empagliflozin  (JARDIANCE ) 10 MG TABS tablet Take 1 tablet (10 mg total) by mouth daily before breakfast.   Insulin  Isophane & Regular Human (NOVOLIN  70/30 FLEXPEN) (70-30) 100 UNIT/ML PEN Inject 15 Units into the skin 2 (two) times daily.   Insulin  Pen Needle (PEN NEEDLES 3/16") 31G X 5  MM MISC 1 Container by Does not apply route 2 (two) times daily.   metoprolol  succinate (TOPROL -XL) 50 MG 24 hr tablet TAKE 1 TABLET BY MOUTH DAILY WITH OR IMMEDIATELY FOLLOWING A MEAL   Multiple Vitamin (MULTIVITAMIN) tablet Take 1 tablet by mouth daily.   ONETOUCH VERIO test strip 1 each daily.   sacubitril -valsartan  (ENTRESTO ) 49-51 MG Take 1 tablet by mouth 2 (two) times daily.   spironolactone  (ALDACTONE ) 25 MG tablet TAKE 1/2 TABLET DAILY   [DISCONTINUED] TRELEGY ELLIPTA 200-62.5-25 MCG/ACT AEPB Inhale 1 puff into the lungs daily.   rosuvastatin  (CRESTOR ) 5 MG tablet Take 1 tablet (5 mg total) by mouth daily.   Tiotropium Bromide-Olodaterol (STIOLTO RESPIMAT) 2.5-2.5 MCG/ACT AERS Inhale 2 puffs into the lungs daily.   [DISCONTINUED] Tiotropium Bromide-Olodaterol (STIOLTO RESPIMAT) 2.5-2.5 MCG/ACT AERS Inhale into the lungs. (Patient not taking: Reported on 09/23/2023)   Facility-Administered Encounter Medications as of 09/23/2023  Medication   heparin  lock flush 100 unit/mL   sodium chloride  flush (NS) 0.9 % injection 10 mL     Review of Systems  Review of Systems  No chest pain with exertion.  No orthopnea or PND.  Comprehensive review of systems otherwise negative. Physical Exam  BP (!) 143/76 (BP Location: Left Arm, Patient Position: Sitting, Cuff Size: Large)   Pulse 61   Ht 6\' 3"  (1.905 m)   Wt (!) 303 lb 3.2 oz (137.5 kg)   SpO2 (!) 88%   BMI 37.90 kg/m   Wt Readings from Last 5 Encounters:  09/23/23 (!) 303 lb 3.2 oz (137.5 kg)  09/22/23 (!) 302 lb 11.2 oz (137.3 kg)  07/13/23 (!) 304 lb 3.2 oz (138 kg)  06/15/23 (!) 309 lb 4.8 oz (140.3 kg)  05/06/23 (!) 310 lb (140.6 kg)    BMI Readings from Last 5 Encounters:  09/23/23 37.90 kg/m  09/22/23 36.85 kg/m  07/13/23 37.03 kg/m  06/15/23 37.65 kg/m  05/06/23 38.75 kg/m      Physical Exam General: Sitting in chair, no acute distress Eyes: EOMI, no icterus Neck: Supple, no JVP Pulmonary: Distant, clear, normal work of breathing Cardiovascular: Regular in rhythm, no murmur Abdomen: Nondistended bowel sounds present MSK: No symptoms, no joint effusion Neuro: Normal gait, no weakness Psych: Normal mood, full affect   Assessment & Plan:   COPD: Gold B.  Severe with FEV1 35% 2021.  Related emphysema seen on CT scan in the past.  No improvement Trelegy versus Stiolto in his opinion.  Stop Trelegy, new prescription for Stiolto today.  For what ever reason breathing worsens, we can always  go back to Trelegy.  Discussed addition of Ohtuvayre , he would like to do more research before making a decision.  Will discuss with his insurance company as well.  Chronic hypoxemic respiratory failure: Related to emphysema.  Encouraged to use oxygen  with ambulation.  He has none with him today.  Keep oxygen  saturation 90%.  His prior POC is broken currently has tanks to use at home.  In addition to home concentrator.  Encouraged him to contact his oxygen  company for a refresher on how to use the tanks.   Return in about 6 months (around 03/25/2024) for f/u Dr. Marygrace Snellen.   Guerry Leek, MD 09/23/2023   This appointment required 60 minutes of patient care (this includes precharting, chart review, review of results, face-to-face care, etc.).

## 2023-10-01 ENCOUNTER — Other Ambulatory Visit (HOSPITAL_COMMUNITY): Payer: Self-pay

## 2023-10-08 ENCOUNTER — Other Ambulatory Visit (HOSPITAL_COMMUNITY): Payer: Self-pay

## 2023-10-11 ENCOUNTER — Other Ambulatory Visit (HOSPITAL_COMMUNITY): Payer: Self-pay

## 2023-10-12 ENCOUNTER — Other Ambulatory Visit (HOSPITAL_COMMUNITY): Payer: Self-pay

## 2023-10-12 ENCOUNTER — Other Ambulatory Visit (HOSPITAL_BASED_OUTPATIENT_CLINIC_OR_DEPARTMENT_OTHER): Payer: Self-pay

## 2023-10-12 MED ORDER — EMPAGLIFLOZIN 10 MG PO TABS
10.0000 mg | ORAL_TABLET | Freq: Every day | ORAL | 2 refills | Status: AC
  Filled 2023-10-12 (×2): qty 100, 100d supply, fill #0
  Filled 2024-01-12: qty 100, 100d supply, fill #1
  Filled 2024-04-07: qty 100, 100d supply, fill #2

## 2023-10-12 MED ORDER — ROSUVASTATIN CALCIUM 5 MG PO TABS
5.0000 mg | ORAL_TABLET | Freq: Every day | ORAL | 0 refills | Status: DC
  Filled 2023-10-12 – 2023-11-29 (×2): qty 90, 90d supply, fill #0

## 2023-10-13 ENCOUNTER — Other Ambulatory Visit (HOSPITAL_COMMUNITY): Payer: Self-pay

## 2023-10-14 ENCOUNTER — Other Ambulatory Visit (HOSPITAL_COMMUNITY): Payer: Self-pay

## 2023-10-15 ENCOUNTER — Other Ambulatory Visit (HOSPITAL_BASED_OUTPATIENT_CLINIC_OR_DEPARTMENT_OTHER): Payer: Self-pay

## 2023-10-19 ENCOUNTER — Other Ambulatory Visit (HOSPITAL_COMMUNITY): Payer: Self-pay

## 2023-10-20 ENCOUNTER — Other Ambulatory Visit (HOSPITAL_COMMUNITY): Payer: Self-pay

## 2023-10-21 ENCOUNTER — Other Ambulatory Visit (HOSPITAL_COMMUNITY): Payer: Self-pay

## 2023-10-21 ENCOUNTER — Other Ambulatory Visit: Payer: Self-pay

## 2023-10-21 MED ORDER — ALLOPURINOL 300 MG PO TABS
300.0000 mg | ORAL_TABLET | Freq: Every day | ORAL | 2 refills | Status: DC
  Filled 2023-10-21 – 2023-10-25 (×2): qty 90, 90d supply, fill #0
  Filled 2024-01-12: qty 90, 90d supply, fill #1

## 2023-10-21 MED FILL — Metoprolol Succinate Tab ER 24HR 50 MG (Tartrate Equiv): ORAL | 90 days supply | Qty: 90 | Fill #0 | Status: AC

## 2023-10-22 ENCOUNTER — Other Ambulatory Visit (HOSPITAL_COMMUNITY): Payer: Self-pay

## 2023-10-23 ENCOUNTER — Encounter (HOSPITAL_BASED_OUTPATIENT_CLINIC_OR_DEPARTMENT_OTHER): Payer: Self-pay | Admitting: Cardiology

## 2023-10-25 ENCOUNTER — Other Ambulatory Visit (HOSPITAL_COMMUNITY): Payer: Self-pay

## 2023-10-25 ENCOUNTER — Other Ambulatory Visit: Payer: Self-pay

## 2023-10-25 MED ORDER — SACUBITRIL-VALSARTAN 49-51 MG PO TABS: 1.0000 | ORAL_TABLET | Freq: Two times a day (BID) | ORAL | 3 refills | Status: DC

## 2023-10-25 NOTE — Telephone Encounter (Signed)
 RX sent to requested Pharmacy

## 2023-10-25 NOTE — Telephone Encounter (Signed)
Routing to correct triage pool

## 2023-10-28 ENCOUNTER — Other Ambulatory Visit (HOSPITAL_COMMUNITY): Payer: Self-pay

## 2023-10-28 ENCOUNTER — Other Ambulatory Visit: Payer: Self-pay | Admitting: Cardiology

## 2023-10-28 ENCOUNTER — Other Ambulatory Visit: Payer: Self-pay

## 2023-10-28 ENCOUNTER — Telehealth: Payer: Self-pay | Admitting: Cardiology

## 2023-10-28 MED ORDER — SACUBITRIL-VALSARTAN 49-51 MG PO TABS
1.0000 | ORAL_TABLET | Freq: Two times a day (BID) | ORAL | 3 refills | Status: AC
  Filled 2023-10-28: qty 180, 90d supply, fill #0
  Filled 2024-01-12 – 2024-01-19 (×2): qty 180, 90d supply, fill #1
  Filled 2024-04-07: qty 180, 90d supply, fill #2

## 2023-10-28 NOTE — Telephone Encounter (Signed)
 Routing to correct pool.

## 2023-10-28 NOTE — Telephone Encounter (Signed)
*  STAT* If patient is at the pharmacy, call can be transferred to refill team.   1. Which medications need to be refilled? (please list name of each medication and dose if known)   sacubitril -valsartan  (ENTRESTO ) 49-51 MG   2. Which pharmacy/location (including street and city if local pharmacy) is medication to be sent to?[place] [facility]   3. Do they need a 30 day or 90 day supply? 90

## 2023-10-29 NOTE — Telephone Encounter (Signed)
 Medication refill was sent into [facility] 10/28/23.

## 2023-11-18 ENCOUNTER — Other Ambulatory Visit (HOSPITAL_COMMUNITY): Payer: Self-pay

## 2023-11-28 ENCOUNTER — Ambulatory Visit (HOSPITAL_BASED_OUTPATIENT_CLINIC_OR_DEPARTMENT_OTHER): Attending: Cardiology | Admitting: Cardiology

## 2023-11-28 DIAGNOSIS — R0902 Hypoxemia: Secondary | ICD-10-CM | POA: Diagnosis not present

## 2023-11-28 DIAGNOSIS — G4733 Obstructive sleep apnea (adult) (pediatric): Secondary | ICD-10-CM | POA: Diagnosis not present

## 2023-11-28 DIAGNOSIS — R400 Somnolence: Secondary | ICD-10-CM | POA: Diagnosis not present

## 2023-11-28 DIAGNOSIS — R0683 Snoring: Secondary | ICD-10-CM | POA: Insufficient documentation

## 2023-11-28 DIAGNOSIS — I110 Hypertensive heart disease with heart failure: Secondary | ICD-10-CM | POA: Diagnosis not present

## 2023-11-28 DIAGNOSIS — I10 Essential (primary) hypertension: Secondary | ICD-10-CM

## 2023-11-28 DIAGNOSIS — I5042 Chronic combined systolic (congestive) and diastolic (congestive) heart failure: Secondary | ICD-10-CM | POA: Diagnosis not present

## 2023-11-28 DIAGNOSIS — I493 Ventricular premature depolarization: Secondary | ICD-10-CM | POA: Insufficient documentation

## 2023-11-28 DIAGNOSIS — I517 Cardiomegaly: Secondary | ICD-10-CM

## 2023-11-28 DIAGNOSIS — E785 Hyperlipidemia, unspecified: Secondary | ICD-10-CM | POA: Diagnosis not present

## 2023-11-29 ENCOUNTER — Other Ambulatory Visit (HOSPITAL_COMMUNITY): Payer: Self-pay

## 2023-11-30 NOTE — Procedures (Signed)
 [facility] [address] [place], [place] [address] Tel: [phone]   Fax: [phone]  Titration Interpretation  Patient Name:  Tim [name], Tim [name] Date:  11/28/2023 Referring Physician:  WILBERT BIHARI [phone]) %% Indications for Polysomnography The patient is a 68 year old Male who is 6' 3 and weighs 300.0 lbs. His BMI equals 37.7.  A full night titration treatment study was performed.  Medications taken while at sleep lab:  NO MEDICATIONS TAKEN.   Polysomnogram Data A full night polysomnogram recorded the standard physiologic parameters including EEG, EOG, EMG, EKG, nasal and oral airflow.  Respiratory parameters of chest and abdominal movements were recorded with Respiratory Inductance Plethysmography belts.  Oxygen  saturation was recorded by pulse oximetry.   Sleep Architecture The total recording time of the polysomnogram was 462.3 minutes.  The total sleep time was 132.5 minutes.  The patient spent 3.4% of total sleep time in Stage N1, 42.3% in Stage N2, 19.2% in Stages N3, and 35.1% in REM.  Sleep latency was 44.0 minutes.  REM latency was 7.0 minutes.  Sleep Efficiency was 28.7%.  Wake after Sleep Onset time was 285.5 minutes.  Titration Summary The patient was titrated at pressures ranging from 6 cm/H20 up to 16/10 cm/H20 with supplemental oxygen  at O2: 2L.  The last pressure used in the study was 16/10 cm/H20 with supplemental oxygen  at O2: 2L.  Respiratory Events The polysomnogram revealed a presence of 0 obstructive, 2 centrals, and 0 mixed apneas resulting in an Apnea index of 0.9 events per hour.  There were 20 hypopneas (>=3% desaturation and/or arousal) resulting in an Apnea\Hypopnea Index (AHI >=3% desaturation and/or arousal) of 10.0 events per hour.  There were 11 hypopneas (>=4% desaturation) resulting in an Apnea\Hypopnea Index (AHI >=4% desaturation) of 5.9 events per hour.  There were 27 Respiratory Effort Related  Arousals resulting in a RERA index of 12.2 events per hour. The Respiratory Disturbance Index is 22.2 events per hour.  The snore index was 0 events per hour.  Mean oxygen  saturation was 87.4%.  The lowest oxygen  saturation during sleep was 72.0%.  Time spent <=88% oxygen  saturation was 291.7 minutes (63.9%).  Limb Activity There were 132 limb movements recorded.  Of this total, 129 were classified as PLMs.  Of the PLMs, 1 were associated with arousals.  The Limb Movement index was 59.8 per hour while the PLM index was 58.4 per hour.  Cardiac Summary The average pulse rate was 52.6 bpm.  The minimum pulse rate was 28.0 bpm while the maximum pulse rate was 67.0 bpm.  Cardiac rhythm was normal sinus rhythm with frequent PVCs and bigeminal PVCs  Diagnosis:  Obstructive sleep apnea Nocturnal hypoxemia PVCs Unsuccessful CPAP titration due to ongoing respiratory events Successful BiPAP titration    Recommendations: Recommend a trial of ResMed BiPAP therapy at 16/10 cm H2O with heated humidity and large AirFit F20 mask as well as O2 at 2 L via BiPAP . Close follow-up is necessary to ensure success with PAP therapy for maximum benefit.  A follow-up oximetry study on CPAP is recommended to assess the adequacy of therapy and determine the need for supplemental oxygen  or the potential need for Bi-level therapy.  An arterial blood gas to determine the adequacy of baseline ventilation and oxygenation should also be considered. Healthy sleep recommendations include:  adequate nightly sleep (normal 7-9 hrs/night), avoidance of caffeine after noon and alcohol near bedtime, and maintaining a sleep environment that is cool, dark and quiet. Weight  loss for overweight patients is recommended.  Even modest amounts of weight loss can significantly improve the severity of sleep apnea. Snoring recommendations include:  weight loss where appropriate, side sleeping, and avoidance of alcohol before bed. Operation of  motor vehicle should be avoided when sleepy.   This study was personally reviewed and electronically signed by: SHLOMO WILBERT SAUNDERS, MD Accredited Board Certified in Sleep Medicine Date/Time: 11/30/2023 1:41PM

## 2023-12-24 ENCOUNTER — Encounter (HOSPITAL_BASED_OUTPATIENT_CLINIC_OR_DEPARTMENT_OTHER): Payer: Self-pay | Admitting: Cardiology

## 2023-12-28 ENCOUNTER — Telehealth: Payer: Self-pay | Admitting: *Deleted

## 2023-12-28 DIAGNOSIS — G4733 Obstructive sleep apnea (adult) (pediatric): Secondary | ICD-10-CM

## 2023-12-28 DIAGNOSIS — I10 Essential (primary) hypertension: Secondary | ICD-10-CM

## 2023-12-28 DIAGNOSIS — R400 Somnolence: Secondary | ICD-10-CM

## 2023-12-28 DIAGNOSIS — R0683 Snoring: Secondary | ICD-10-CM

## 2023-12-28 NOTE — Telephone Encounter (Signed)
-----   Message from Wilbert Bihari sent at 11/30/2023  1:42 PM EDT ----- Please let patient know that they had a successful PAP titration and let DME know that orders are in EPIC.  Please set up 6 week OV with me. Needs ONO on 2L [place] and BiPAP

## 2023-12-28 NOTE — Telephone Encounter (Signed)
 Needs ONO on 2L [place] and BiPAP

## 2023-12-28 NOTE — Addendum Note (Signed)
 Addended by: JOSHUA DALTON MATSU on: 12/28/2023 12:41 PM   Modules accepted: Orders

## 2023-12-28 NOTE — Telephone Encounter (Addendum)
 The patient has been notified of the result and verbalized understanding.  All questions (if any) were answered. Tim [name], CMA 12/28/2023 12:31 PM    Upon patient request DME selection is [facility] Patient understands he will be contacted by [facility] to set up his cpap. Patient understands to call if [facility] does not contact him with new setup in a timely manner. Patient understands they will be called once confirmation has been received from [facility] that they have received their new machine to schedule 10 week follow up appointment.   [facility] notified of new cpap order  Please add to airview Patient was grateful for the call and thanked me.

## 2024-01-12 ENCOUNTER — Other Ambulatory Visit (HOSPITAL_COMMUNITY): Payer: Self-pay

## 2024-01-12 MED FILL — Metoprolol Succinate Tab ER 24HR 50 MG (Tartrate Equiv): ORAL | 90 days supply | Qty: 90 | Fill #1 | Status: AC

## 2024-01-13 ENCOUNTER — Other Ambulatory Visit: Payer: Self-pay

## 2024-01-19 ENCOUNTER — Other Ambulatory Visit (HOSPITAL_COMMUNITY): Payer: Self-pay

## 2024-02-15 ENCOUNTER — Other Ambulatory Visit (HOSPITAL_COMMUNITY): Payer: Self-pay

## 2024-02-15 ENCOUNTER — Other Ambulatory Visit: Payer: Self-pay

## 2024-02-15 MED ORDER — ONETOUCH VERIO VI STRP
ORAL_STRIP | 3 refills | Status: AC
  Filled 2024-02-15: qty 100, 90d supply, fill #0

## 2024-02-15 MED ORDER — MELOXICAM 15 MG PO TABS
15.0000 mg | ORAL_TABLET | Freq: Every day | ORAL | 0 refills | Status: DC | PRN
  Filled 2024-02-15: qty 90, 90d supply, fill #0

## 2024-02-15 MED ORDER — DICLOFENAC SODIUM 1 % EX GEL
CUTANEOUS | 4 refills | Status: AC
  Filled 2024-02-15: qty 100, 12d supply, fill #0
  Filled 2024-04-07: qty 100, 12d supply, fill #1

## 2024-02-15 MED ORDER — PRECISION QID TEST VI STRP: ORAL_STRIP | 3 refills | Status: AC

## 2024-02-15 MED ORDER — TAMSULOSIN HCL 0.4 MG PO CAPS
0.4000 mg | ORAL_CAPSULE | Freq: Every day | ORAL | 0 refills | Status: AC
  Filled 2024-02-15: qty 90, 90d supply, fill #0

## 2024-02-16 ENCOUNTER — Other Ambulatory Visit (HOSPITAL_COMMUNITY): Payer: Self-pay

## 2024-02-16 ENCOUNTER — Other Ambulatory Visit: Payer: Self-pay

## 2024-02-21 ENCOUNTER — Other Ambulatory Visit (HOSPITAL_COMMUNITY): Payer: Self-pay

## 2024-02-21 ENCOUNTER — Other Ambulatory Visit (HOSPITAL_BASED_OUTPATIENT_CLINIC_OR_DEPARTMENT_OTHER): Payer: Self-pay | Admitting: Cardiology

## 2024-02-23 ENCOUNTER — Other Ambulatory Visit (HOSPITAL_COMMUNITY): Payer: Self-pay

## 2024-02-24 ENCOUNTER — Other Ambulatory Visit: Payer: Self-pay

## 2024-02-24 ENCOUNTER — Other Ambulatory Visit (HOSPITAL_COMMUNITY): Payer: Self-pay

## 2024-02-24 MED FILL — Rosuvastatin Calcium Tab 5 MG: ORAL | 90 days supply | Qty: 90 | Fill #0 | Status: AC

## 2024-04-07 ENCOUNTER — Other Ambulatory Visit (HOSPITAL_COMMUNITY): Payer: Self-pay

## 2024-04-07 MED FILL — Metoprolol Succinate Tab ER 24HR 50 MG (Tartrate Equiv): ORAL | 90 days supply | Qty: 90 | Fill #2 | Status: AC

## 2024-04-11 ENCOUNTER — Other Ambulatory Visit (HOSPITAL_COMMUNITY): Payer: Self-pay

## 2024-04-12 ENCOUNTER — Other Ambulatory Visit (HOSPITAL_COMMUNITY): Payer: Self-pay

## 2024-04-12 ENCOUNTER — Telehealth: Payer: Self-pay | Admitting: Cardiology

## 2024-04-12 MED ORDER — ALLOPURINOL 300 MG PO TABS
300.0000 mg | ORAL_TABLET | Freq: Every day | ORAL | 2 refills | Status: AC
  Filled 2024-04-12: qty 90, 90d supply, fill #0

## 2024-04-12 NOTE — Telephone Encounter (Signed)
 Pt is calling for authorization form for his Cpap machine. Please advise

## 2024-04-13 ENCOUNTER — Other Ambulatory Visit: Payer: Self-pay

## 2024-04-13 NOTE — Telephone Encounter (Signed)
 Patient needed an appointment for sleep compliance. Message sent to the schedulers to schedule the patient and patient was given the number to call the schedulers.

## 2024-04-14 NOTE — Telephone Encounter (Signed)
 Called pt to schedule follow up with Dr Kate. He mentioned he needs some sort of authorization form in order for the cpap to be beneficial. Is ok to see Kate if that can be done thru him. Please call and advise to get specifics. He has been trying to reach someone all week. And would like some answers.

## 2024-04-19 ENCOUNTER — Telehealth: Payer: Self-pay | Admitting: Cardiology

## 2024-04-19 NOTE — Telephone Encounter (Signed)
°  Per Mychart scheduling message:  I had a sleep study on 11/30/23. And received a BiPAP on 01/14/24. [facility] said they needed authorizationfor insurance coverage. I have called the office multiple times last week and have not heard anything.Could someone call me and tell me how to proceed with this issue. Tim [name] [phone]

## 2024-04-28 ENCOUNTER — Ambulatory Visit: Payer: Self-pay

## 2024-04-28 NOTE — Telephone Encounter (Signed)
 FYI Only or Action Required?: FYI only for provider: advised to follow up with PCP.  Patient was last seen in primary care on N/A.Tim [name]  Called Nurse Triage reporting Hand Pain and Numbness.  Symptoms began several months ago.  Interventions attempted: Prescription medications: Voltaren  gel, meloxicam , celebrex.  Symptoms are: gradually worsening.  Triage Disposition: See PCP When Office is Open (Within 3 Days)  Patient/caregiver understands and will follow disposition?: Yes            Copied from CRM #[id number]. Topic: Clinical - Red Word Triage >> Apr 28, 2024  3:24 PM Mercer PEDLAR wrote: Red Word that prompted transfer to Nurse Triage: Numbness and throbbing pain in hands, unable to make a fist. Reason for Disposition  [1] Numbness or tingling in one or both hands AND [2] is a chronic symptom (recurrent or ongoing AND present > 4 weeks)  Answer Assessment - Initial Assessment Questions Patient states he is calling for [name] to look at options, he states he has a PCP but is not wanting to leave her care at this time. He just wants to see someone specialized in geriatrics. Advised him [facility] is a primary care and would take over as his PCP. Patient advised to follow up with his PCP and call back if he would like to establish as a PCP.    1. SYMPTOM: What is the main symptom you are concerned about? (e.g., weakness, numbness)     Stiff joints, muscle pain and numbness in bilateral hands and wrist. Worse on the left hand and worse in the knuckles.   2. ONSET: When did this start? (e.g., minutes, hours, days; while sleeping)     Several months, worsening over 2-3 months.  3. LAST NORMAL: When was the last time you (the patient) were normal (no symptoms)?     Months.  4. PATTERN Does this come and go, or has it been constant since it started?  Is it present now?     Constant, present now. Improves throughout the day.  5. CARDIAC SYMPTOMS: Have you had any of  the following symptoms: chest pain, difficulty breathing, palpitations?     No.  6. NEUROLOGIC SYMPTOMS: Have you had any of the following symptoms: headache, dizziness, vision loss, double vision, changes in speech, unsteady on your feet?     No.  7. OTHER SYMPTOMS: Do you have any other symptoms?     5/10 pain in both hands sharp shocking; can't fully close his fists.  Protocols used: Neurologic Deficit-A-AH

## 2024-05-12 ENCOUNTER — Telehealth: Payer: Self-pay | Admitting: *Deleted

## 2024-05-12 ENCOUNTER — Encounter (HOSPITAL_BASED_OUTPATIENT_CLINIC_OR_DEPARTMENT_OTHER): Payer: Self-pay | Admitting: Emergency Medicine

## 2024-05-12 ENCOUNTER — Other Ambulatory Visit: Payer: Self-pay

## 2024-05-12 ENCOUNTER — Inpatient Hospital Stay (HOSPITAL_BASED_OUTPATIENT_CLINIC_OR_DEPARTMENT_OTHER)
Admission: EM | Admit: 2024-05-12 | Discharge: 2024-05-15 | DRG: 643 | Disposition: A | Discharge status: Home / Self Care | Source: Other Acute Inpatient Hospital | Attending: Internal Medicine | Admitting: Internal Medicine

## 2024-05-12 DIAGNOSIS — Z79899 Other long term (current) drug therapy: Secondary | ICD-10-CM

## 2024-05-12 DIAGNOSIS — Z9981 Dependence on supplemental oxygen: Secondary | ICD-10-CM

## 2024-05-12 DIAGNOSIS — J9611 Chronic respiratory failure with hypoxia: Secondary | ICD-10-CM | POA: Diagnosis present

## 2024-05-12 DIAGNOSIS — L97929 Non-pressure chronic ulcer of unspecified part of left lower leg with unspecified severity: Secondary | ICD-10-CM | POA: Diagnosis present

## 2024-05-12 DIAGNOSIS — G4733 Obstructive sleep apnea (adult) (pediatric): Secondary | ICD-10-CM | POA: Diagnosis present

## 2024-05-12 DIAGNOSIS — E861 Hypovolemia: Secondary | ICD-10-CM | POA: Diagnosis present

## 2024-05-12 DIAGNOSIS — E871 Hypo-osmolality and hyponatremia: Principal | ICD-10-CM | POA: Diagnosis present

## 2024-05-12 DIAGNOSIS — L97919 Non-pressure chronic ulcer of unspecified part of right lower leg with unspecified severity: Secondary | ICD-10-CM | POA: Diagnosis present

## 2024-05-12 DIAGNOSIS — Z7984 Long term (current) use of oral hypoglycemic drugs: Secondary | ICD-10-CM

## 2024-05-12 DIAGNOSIS — E222 Syndrome of inappropriate secretion of antidiuretic hormone: Principal | ICD-10-CM | POA: Diagnosis present

## 2024-05-12 DIAGNOSIS — Z791 Long term (current) use of non-steroidal anti-inflammatories (NSAID): Secondary | ICD-10-CM

## 2024-05-12 DIAGNOSIS — Z923 Personal history of irradiation: Secondary | ICD-10-CM

## 2024-05-12 DIAGNOSIS — G9389 Other specified disorders of brain: Secondary | ICD-10-CM | POA: Diagnosis present

## 2024-05-12 DIAGNOSIS — I5043 Acute on chronic combined systolic (congestive) and diastolic (congestive) heart failure: Secondary | ICD-10-CM | POA: Diagnosis present

## 2024-05-12 DIAGNOSIS — J449 Chronic obstructive pulmonary disease, unspecified: Secondary | ICD-10-CM | POA: Diagnosis present

## 2024-05-12 DIAGNOSIS — I110 Hypertensive heart disease with heart failure: Secondary | ICD-10-CM | POA: Diagnosis present

## 2024-05-12 DIAGNOSIS — Z794 Long term (current) use of insulin: Secondary | ICD-10-CM

## 2024-05-12 DIAGNOSIS — E66812 Obesity, class 2: Secondary | ICD-10-CM | POA: Diagnosis present

## 2024-05-12 DIAGNOSIS — I10 Essential (primary) hypertension: Secondary | ICD-10-CM | POA: Diagnosis present

## 2024-05-12 DIAGNOSIS — Z66 Do not resuscitate: Secondary | ICD-10-CM | POA: Diagnosis present

## 2024-05-12 DIAGNOSIS — K760 Fatty (change of) liver, not elsewhere classified: Secondary | ICD-10-CM | POA: Diagnosis present

## 2024-05-12 DIAGNOSIS — R54 Age-related physical debility: Secondary | ICD-10-CM | POA: Diagnosis present

## 2024-05-12 DIAGNOSIS — M109 Gout, unspecified: Secondary | ICD-10-CM | POA: Diagnosis present

## 2024-05-12 DIAGNOSIS — E119 Type 2 diabetes mellitus without complications: Secondary | ICD-10-CM | POA: Diagnosis present

## 2024-05-12 DIAGNOSIS — Z87891 Personal history of nicotine dependence: Secondary | ICD-10-CM

## 2024-05-12 DIAGNOSIS — I5042 Chronic combined systolic (congestive) and diastolic (congestive) heart failure: Secondary | ICD-10-CM | POA: Diagnosis present

## 2024-05-12 DIAGNOSIS — C8339 Primary central nervous system lymphoma: Secondary | ICD-10-CM | POA: Diagnosis present

## 2024-05-12 DIAGNOSIS — Z6837 Body mass index (BMI) 37.0-37.9, adult: Secondary | ICD-10-CM

## 2024-05-12 DIAGNOSIS — Z809 Family history of malignant neoplasm, unspecified: Secondary | ICD-10-CM

## 2024-05-12 DIAGNOSIS — F1010 Alcohol abuse, uncomplicated: Secondary | ICD-10-CM | POA: Diagnosis present

## 2024-05-12 LAB — CBC WITH DIFFERENTIAL/PLATELET
Abs Immature Granulocytes: 0.03 K/uL (ref 0.00–0.07)
Basophils Absolute: 0 K/uL (ref 0.0–0.1)
Basophils Relative: 0 %
Eosinophils Absolute: 0.1 K/uL (ref 0.0–0.5)
Eosinophils Relative: 1 %
HCT: 40.7 % (ref 39.0–52.0)
Hemoglobin: 14.5 g/dL (ref 13.0–17.0)
Immature Granulocytes: 0 %
Lymphocytes Relative: 10 %
Lymphs Abs: 0.8 K/uL (ref 0.7–4.0)
MCH: 31.5 pg (ref 26.0–34.0)
MCHC: 35.6 g/dL (ref 30.0–36.0)
MCV: 88.5 fL (ref 80.0–100.0)
Monocytes Absolute: 1.2 K/uL — ABNORMAL HIGH (ref 0.1–1.0)
Monocytes Relative: 16 %
Neutro Abs: 5.7 K/uL (ref 1.7–7.7)
Neutrophils Relative %: 73 %
Platelets: 137 K/uL — ABNORMAL LOW (ref 150–400)
RBC: 4.6 MIL/uL (ref 4.22–5.81)
RDW: 13.9 % (ref 11.5–15.5)
WBC: 7.7 K/uL (ref 4.0–10.5)
nRBC: 0 % (ref 0.0–0.2)

## 2024-05-12 LAB — COMPREHENSIVE METABOLIC PANEL WITH GFR
ALT: 71 U/L — ABNORMAL HIGH (ref 0–44)
AST: 74 U/L — ABNORMAL HIGH (ref 15–41)
Albumin: 4.2 g/dL (ref 3.5–5.0)
Alkaline Phosphatase: 134 U/L — ABNORMAL HIGH (ref 38–126)
Anion gap: 12 (ref 5–15)
BUN: 11 mg/dL (ref 8–23)
CO2: 25 mmol/L (ref 22–32)
Calcium: 9.4 mg/dL (ref 8.9–10.3)
Chloride: 81 mmol/L — ABNORMAL LOW (ref 98–111)
Creatinine, Ser: 0.68 mg/dL (ref 0.61–1.24)
GFR, Estimated: >60 mL/min
Glucose, Bld: 106 mg/dL — ABNORMAL HIGH (ref 70–99)
Potassium: 4.9 mmol/L (ref 3.5–5.1)
Sodium: 117 mmol/L — CL (ref 135–145)
Total Bilirubin: 1.4 mg/dL — ABNORMAL HIGH (ref 0.0–1.2)
Total Protein: 7.5 g/dL (ref 6.5–8.1)

## 2024-05-12 NOTE — Progress Notes (Signed)
"  Critical Lab Na 118. Pt advised to seek care at ED. He prefers to wait until the morning. Indications to seek emergency care reviewed. "

## 2024-05-12 NOTE — Telephone Encounter (Signed)
 Received call from pt. He states he needs an appt with Dr. 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 He states his balance is off and his thought processes are off as well. He states he can't seem to use the computer, that he can't quite figure out what he needs to do on the computer. He is also asking if Dr. Buckley could prescribe some steroids for him as this seemed to help the last time this occurred. Advised that I can add him to Dr. Eward schedule on 05/15/24 @ 10:30 am and likely he will want an MRI. Pt voiced understanding. And is agreeable to an appt on Monday.  Advised that I would let Dr. Buckley know of his concerns.

## 2024-05-12 NOTE — ED Triage Notes (Signed)
 Patient reports his PCP told him his NA was 118 and to come to the ER.  Patient on 3L  chronic.

## 2024-05-13 ENCOUNTER — Emergency Department (HOSPITAL_BASED_OUTPATIENT_CLINIC_OR_DEPARTMENT_OTHER)

## 2024-05-13 DIAGNOSIS — E222 Syndrome of inappropriate secretion of antidiuretic hormone: Secondary | ICD-10-CM | POA: Diagnosis present

## 2024-05-13 DIAGNOSIS — Z79899 Other long term (current) drug therapy: Secondary | ICD-10-CM | POA: Diagnosis not present

## 2024-05-13 DIAGNOSIS — Z6837 Body mass index (BMI) 37.0-37.9, adult: Secondary | ICD-10-CM | POA: Diagnosis not present

## 2024-05-13 DIAGNOSIS — C8339 Primary central nervous system lymphoma: Secondary | ICD-10-CM

## 2024-05-13 DIAGNOSIS — E871 Hypo-osmolality and hyponatremia: Principal | ICD-10-CM

## 2024-05-13 DIAGNOSIS — E66812 Obesity, class 2: Secondary | ICD-10-CM | POA: Diagnosis present

## 2024-05-13 DIAGNOSIS — Z66 Do not resuscitate: Secondary | ICD-10-CM | POA: Diagnosis present

## 2024-05-13 DIAGNOSIS — I10 Essential (primary) hypertension: Secondary | ICD-10-CM | POA: Diagnosis not present

## 2024-05-13 DIAGNOSIS — L97919 Non-pressure chronic ulcer of unspecified part of right lower leg with unspecified severity: Secondary | ICD-10-CM | POA: Diagnosis present

## 2024-05-13 DIAGNOSIS — I5043 Acute on chronic combined systolic (congestive) and diastolic (congestive) heart failure: Secondary | ICD-10-CM | POA: Diagnosis present

## 2024-05-13 DIAGNOSIS — I110 Hypertensive heart disease with heart failure: Secondary | ICD-10-CM | POA: Diagnosis present

## 2024-05-13 DIAGNOSIS — G4733 Obstructive sleep apnea (adult) (pediatric): Secondary | ICD-10-CM | POA: Diagnosis present

## 2024-05-13 DIAGNOSIS — K760 Fatty (change of) liver, not elsewhere classified: Secondary | ICD-10-CM | POA: Diagnosis present

## 2024-05-13 DIAGNOSIS — I5042 Chronic combined systolic (congestive) and diastolic (congestive) heart failure: Secondary | ICD-10-CM | POA: Diagnosis not present

## 2024-05-13 DIAGNOSIS — R54 Age-related physical debility: Secondary | ICD-10-CM | POA: Diagnosis present

## 2024-05-13 DIAGNOSIS — F1010 Alcohol abuse, uncomplicated: Secondary | ICD-10-CM | POA: Diagnosis present

## 2024-05-13 DIAGNOSIS — J449 Chronic obstructive pulmonary disease, unspecified: Secondary | ICD-10-CM | POA: Diagnosis present

## 2024-05-13 DIAGNOSIS — Z794 Long term (current) use of insulin: Secondary | ICD-10-CM | POA: Diagnosis not present

## 2024-05-13 DIAGNOSIS — J9611 Chronic respiratory failure with hypoxia: Secondary | ICD-10-CM | POA: Diagnosis present

## 2024-05-13 DIAGNOSIS — E861 Hypovolemia: Secondary | ICD-10-CM | POA: Diagnosis present

## 2024-05-13 DIAGNOSIS — Z9981 Dependence on supplemental oxygen: Secondary | ICD-10-CM | POA: Diagnosis not present

## 2024-05-13 DIAGNOSIS — G9389 Other specified disorders of brain: Secondary | ICD-10-CM | POA: Diagnosis present

## 2024-05-13 DIAGNOSIS — L97929 Non-pressure chronic ulcer of unspecified part of left lower leg with unspecified severity: Secondary | ICD-10-CM | POA: Diagnosis present

## 2024-05-13 DIAGNOSIS — Z87891 Personal history of nicotine dependence: Secondary | ICD-10-CM | POA: Diagnosis not present

## 2024-05-13 DIAGNOSIS — M109 Gout, unspecified: Secondary | ICD-10-CM | POA: Diagnosis present

## 2024-05-13 DIAGNOSIS — E119 Type 2 diabetes mellitus without complications: Secondary | ICD-10-CM | POA: Diagnosis present

## 2024-05-13 DIAGNOSIS — Z7984 Long term (current) use of oral hypoglycemic drugs: Secondary | ICD-10-CM | POA: Diagnosis not present

## 2024-05-13 LAB — HEPATITIS PANEL, ACUTE
HCV Ab: NONREACTIVE
Hep A IgM: NONREACTIVE
Hep B C IgM: NONREACTIVE
Hepatitis B Surface Ag: NONREACTIVE

## 2024-05-13 LAB — BASIC METABOLIC PANEL WITH GFR
Anion gap: 11 (ref 5–15)
Anion gap: 13 (ref 5–15)
Anion gap: 16 — ABNORMAL HIGH (ref 5–15)
Anion gap: 11 (ref 5–15)
BUN: 10 mg/dL (ref 8–23)
BUN: 11 mg/dL (ref 8–23)
BUN: 12 mg/dL (ref 8–23)
BUN: 11 mg/dL (ref 8–23)
CO2: 26 mmol/L (ref 22–32)
CO2: 23 mmol/L (ref 22–32)
CO2: 23 mmol/L (ref 22–32)
CO2: 26 mmol/L (ref 22–32)
Calcium: 9.1 mg/dL (ref 8.9–10.3)
Calcium: 9.3 mg/dL (ref 8.9–10.3)
Calcium: 8.8 mg/dL — ABNORMAL LOW (ref 8.9–10.3)
Calcium: 9 mg/dL (ref 8.9–10.3)
Chloride: 81 mmol/L — ABNORMAL LOW (ref 98–111)
Chloride: 83 mmol/L — ABNORMAL LOW (ref 98–111)
Chloride: 84 mmol/L — ABNORMAL LOW (ref 98–111)
Chloride: 86 mmol/L — ABNORMAL LOW (ref 98–111)
Creatinine, Ser: 0.7 mg/dL (ref 0.61–1.24)
Creatinine, Ser: 0.69 mg/dL (ref 0.61–1.24)
Creatinine, Ser: 0.68 mg/dL (ref 0.61–1.24)
Creatinine, Ser: 0.78 mg/dL (ref 0.61–1.24)
GFR, Estimated: >60 mL/min
GFR, Estimated: >60 mL/min
GFR, Estimated: >60 mL/min
GFR, Estimated: >60 mL/min
Glucose, Bld: 104 mg/dL — ABNORMAL HIGH (ref 70–99)
Glucose, Bld: 111 mg/dL — ABNORMAL HIGH (ref 70–99)
Glucose, Bld: 137 mg/dL — ABNORMAL HIGH (ref 70–99)
Glucose, Bld: 132 mg/dL — ABNORMAL HIGH (ref 70–99)
Potassium: 4.6 mmol/L (ref 3.5–5.1)
Potassium: 4.6 mmol/L (ref 3.5–5.1)
Potassium: 4.9 mmol/L (ref 3.5–5.1)
Potassium: 4.5 mmol/L (ref 3.5–5.1)
Sodium: 117 mmol/L — CL (ref 135–145)
Sodium: 119 mmol/L — CL (ref 135–145)
Sodium: 123 mmol/L — ABNORMAL LOW (ref 135–145)
Sodium: 123 mmol/L — ABNORMAL LOW (ref 135–145)

## 2024-05-13 LAB — URINALYSIS, ROUTINE W REFLEX MICROSCOPIC
Bilirubin Urine: NEGATIVE
Glucose, UA: >=500 mg/dL — AB
Ketones, ur: 15 mg/dL — AB
Leukocytes,Ua: NEGATIVE
Nitrite: NEGATIVE
Protein, ur: NEGATIVE mg/dL
Specific Gravity, Urine: <1.005 — ABNORMAL LOW (ref 1.005–1.030)
pH: 5.5 (ref 5.0–8.0)

## 2024-05-13 LAB — LIPID PANEL
Cholesterol: 130 mg/dL (ref 0–200)
HDL: 87 mg/dL
LDL Cholesterol: 30 mg/dL (ref 0–99)
Total CHOL/HDL Ratio: 1.5 ratio
Triglycerides: 61 mg/dL
VLDL: 12 mg/dL (ref 0–40)

## 2024-05-13 LAB — OSMOLALITY: Osmolality: 257 mosm/kg — ABNORMAL LOW (ref 275–295)

## 2024-05-13 LAB — CBG MONITORING, ED
Glucose-Capillary: 111 mg/dL — ABNORMAL HIGH (ref 70–99)
Glucose-Capillary: 91 mg/dL (ref 70–99)
Glucose-Capillary: 120 mg/dL — ABNORMAL HIGH (ref 70–99)
Glucose-Capillary: 163 mg/dL — ABNORMAL HIGH (ref 70–99)

## 2024-05-13 LAB — CBC
HCT: 36.8 % — ABNORMAL LOW (ref 39.0–52.0)
Hemoglobin: 13.3 g/dL (ref 13.0–17.0)
MCH: 32.1 pg (ref 26.0–34.0)
MCHC: 36.1 g/dL — ABNORMAL HIGH (ref 30.0–36.0)
MCV: 88.9 fL (ref 80.0–100.0)
Platelets: 133 K/uL — ABNORMAL LOW (ref 150–400)
RBC: 4.14 MIL/uL — ABNORMAL LOW (ref 4.22–5.81)
RDW: 13.6 % (ref 11.5–15.5)
WBC: 7.2 K/uL (ref 4.0–10.5)
nRBC: 0 % (ref 0.0–0.2)

## 2024-05-13 LAB — OSMOLALITY, URINE: Osmolality, Ur: 228 mosm/kg — ABNORMAL LOW (ref 300–900)

## 2024-05-13 LAB — SODIUM, URINE, RANDOM: Sodium, Ur: <30 mmol/L

## 2024-05-13 LAB — URINALYSIS, MICROSCOPIC (REFLEX): Bacteria, UA: NONE SEEN

## 2024-05-13 LAB — GLUCOSE, CAPILLARY
Glucose-Capillary: 141 mg/dL — ABNORMAL HIGH (ref 70–99)
Glucose-Capillary: 120 mg/dL — ABNORMAL HIGH (ref 70–99)

## 2024-05-13 LAB — HEMOGLOBIN A1C
Hgb A1c MFr Bld: 5.8 % — ABNORMAL HIGH (ref 4.8–5.6)
Mean Plasma Glucose: 119.76 mg/dL

## 2024-05-13 LAB — TSH: TSH: 2.47 u[IU]/mL (ref 0.350–4.500)

## 2024-05-13 LAB — T4, FREE: Free T4: 1.57 ng/dL (ref 0.80–2.00)

## 2024-05-13 LAB — MRSA NEXT GEN BY PCR, NASAL: MRSA by PCR Next Gen: NOT DETECTED

## 2024-05-13 MED ORDER — SODIUM CHLORIDE 1 G PO TABS: 1.0000 g | ORAL_TABLET | Freq: Three times a day (TID) | ORAL | Status: DC

## 2024-05-13 MED ORDER — HEPARIN SODIUM (PORCINE) 5000 UNIT/ML IJ SOLN
5000.0000 [IU] | Freq: Three times a day (TID) | INTRAMUSCULAR | Status: DC
  Administered 2024-05-13 – 2024-05-15 (×5): 5000 [IU] via SUBCUTANEOUS
  Filled 2024-05-13 (×5): qty 1

## 2024-05-13 MED ORDER — ONDANSETRON HCL 4 MG/2ML IJ SOLN: 4.0000 mg | Freq: Four times a day (QID) | INTRAMUSCULAR | Status: DC | PRN

## 2024-05-13 MED ORDER — ARFORMOTEROL TARTRATE 15 MCG/2ML IN NEBU
15.0000 ug | INHALATION_SOLUTION | Freq: Two times a day (BID) | RESPIRATORY_TRACT | Status: DC
  Administered 2024-05-13 – 2024-05-15 (×4): 15 ug via RESPIRATORY_TRACT
  Filled 2024-05-13 (×4): qty 2

## 2024-05-13 MED ORDER — UMECLIDINIUM BROMIDE 62.5 MCG/ACT IN AEPB
1.0000 | INHALATION_SPRAY | Freq: Every day | RESPIRATORY_TRACT | Status: DC
  Administered 2024-05-14 – 2024-05-15 (×2): 1 via RESPIRATORY_TRACT
  Filled 2024-05-13: qty 7

## 2024-05-13 MED ORDER — ALLOPURINOL 100 MG PO TABS
300.0000 mg | ORAL_TABLET | Freq: Every day | ORAL | Status: DC
  Administered 2024-05-13: 300 mg via ORAL
  Filled 2024-05-13: qty 3

## 2024-05-13 MED ORDER — SODIUM CHLORIDE 0.9 % IV SOLN: Freq: Once | INTRAVENOUS | Status: AC

## 2024-05-13 MED ORDER — EMPAGLIFLOZIN 10 MG PO TABS: 10.0000 mg | ORAL_TABLET | Freq: Every day | ORAL | Status: DC

## 2024-05-13 MED ORDER — ALLOPURINOL 300 MG PO TABS
300.0000 mg | ORAL_TABLET | Freq: Every day | ORAL | Status: DC
  Administered 2024-05-14 – 2024-05-15 (×2): 300 mg via ORAL
  Filled 2024-05-13: qty 1
  Filled 2024-05-13: qty 3

## 2024-05-13 MED ORDER — CHLORHEXIDINE GLUCONATE CLOTH 2 % EX PADS
6.0000 | MEDICATED_PAD | Freq: Every day | CUTANEOUS | Status: DC
  Administered 2024-05-13: 6 via TOPICAL

## 2024-05-13 MED ORDER — THIAMINE MONONITRATE 100 MG PO TABS
100.0000 mg | ORAL_TABLET | Freq: Every day | ORAL | Status: DC
  Administered 2024-05-13 – 2024-05-15 (×3): 100 mg via ORAL
  Filled 2024-05-13 (×3): qty 1

## 2024-05-13 MED ORDER — INSULIN GLARGINE 100 UNIT/ML ~~LOC~~ SOLN
15.0000 [IU] | Freq: Every day | SUBCUTANEOUS | Status: DC
  Administered 2024-05-14 – 2024-05-15 (×2): 15 [IU] via SUBCUTANEOUS
  Filled 2024-05-13 (×2): qty 0.15

## 2024-05-13 MED ORDER — INSULIN ASPART 100 UNIT/ML IJ SOLN: 0.0000 [IU] | Freq: Every day | INTRAMUSCULAR | Status: DC

## 2024-05-13 MED ORDER — TAMSULOSIN HCL 0.4 MG PO CAPS
0.4000 mg | ORAL_CAPSULE | Freq: Every day | ORAL | Status: DC
  Administered 2024-05-13 – 2024-05-15 (×3): 0.4 mg via ORAL
  Filled 2024-05-13 (×3): qty 1

## 2024-05-13 MED ORDER — THIAMINE HCL 100 MG/ML IJ SOLN
100.0000 mg | Freq: Every day | INTRAMUSCULAR | Status: DC
  Filled 2024-05-13: qty 2

## 2024-05-13 MED ORDER — ONDANSETRON HCL 4 MG PO TABS: 4.0000 mg | ORAL_TABLET | Freq: Four times a day (QID) | ORAL | Status: DC | PRN

## 2024-05-13 MED ORDER — LORAZEPAM 2 MG/ML IJ SOLN: 1.0000 mg | INTRAMUSCULAR | Status: DC | PRN

## 2024-05-13 MED ORDER — ACETAMINOPHEN 650 MG RE SUPP: 650.0000 mg | Freq: Four times a day (QID) | RECTAL | Status: DC | PRN

## 2024-05-13 MED ORDER — FOLIC ACID 1 MG PO TABS
1.0000 mg | ORAL_TABLET | Freq: Every day | ORAL | Status: DC
  Administered 2024-05-13 – 2024-05-15 (×3): 1 mg via ORAL
  Filled 2024-05-13 (×3): qty 1

## 2024-05-13 MED ORDER — SODIUM CHLORIDE 1 G PO TABS
1.0000 g | ORAL_TABLET | Freq: Two times a day (BID) | ORAL | Status: DC
  Administered 2024-05-13 – 2024-05-15 (×4): 1 g via ORAL
  Filled 2024-05-13 (×4): qty 1

## 2024-05-13 MED ORDER — METOPROLOL SUCCINATE ER 50 MG PO TB24
50.0000 mg | ORAL_TABLET | Freq: Every day | ORAL | Status: DC
  Administered 2024-05-13 – 2024-05-15 (×3): 50 mg via ORAL
  Filled 2024-05-13: qty 1
  Filled 2024-05-13 (×2): qty 2

## 2024-05-13 MED ORDER — ADULT MULTIVITAMIN W/MINERALS CH
1.0000 | ORAL_TABLET | Freq: Every day | ORAL | Status: DC
  Administered 2024-05-13 – 2024-05-15 (×3): 1 via ORAL
  Filled 2024-05-13 (×3): qty 1

## 2024-05-13 MED ORDER — ROSUVASTATIN CALCIUM 5 MG PO TABS: 5.0000 mg | ORAL_TABLET | Freq: Every day | ORAL | Status: DC

## 2024-05-13 MED ORDER — B COMPLEX PO TABS: 1.0000 | ORAL_TABLET | Freq: Every day | ORAL | Status: DC

## 2024-05-13 MED ORDER — ACETAMINOPHEN 325 MG PO TABS: 650.0000 mg | ORAL_TABLET | Freq: Four times a day (QID) | ORAL | Status: DC | PRN

## 2024-05-13 MED ORDER — INSULIN ASPART 100 UNIT/ML IJ SOLN
0.0000 [IU] | Freq: Three times a day (TID) | INTRAMUSCULAR | Status: DC
  Administered 2024-05-14: 1 [IU] via SUBCUTANEOUS
  Filled 2024-05-13 (×2): qty 1

## 2024-05-13 MED ORDER — LORAZEPAM 1 MG PO TABS: 1.0000 mg | ORAL_TABLET | ORAL | Status: DC | PRN

## 2024-05-13 MED ORDER — SACUBITRIL-VALSARTAN 49-51 MG PO TABS
1.0000 | ORAL_TABLET | Freq: Two times a day (BID) | ORAL | Status: DC
  Administered 2024-05-13 – 2024-05-15 (×4): 1 via ORAL
  Filled 2024-05-13 (×4): qty 1

## 2024-05-13 NOTE — H&P (Signed)
 " History and Physical    Tim [name] FMW:[id number] DOB: 04-10-1956 DOA: 05/12/2024  DOS: the patient was seen and examined on 05/12/2024  PCP: Jolee Madelin Patch, MD   Patient coming from: Home  I have personally briefly reviewed patient's old medical records in [place] [facility]  CC: abnormal labs, Na 48 HPI: 69 year old male with a history of combined systolic/diastolic heart failure, COPD, chronic respiratory failure on home oxygen  at 3 L a minute, OSA on BiPAP, history of CNS lymphoma, type 2 diabetes on insulin , essential hypertension, obesity with a BMI of 37.5 who presents to the ER today with an abnormally low sodium of 118 drawn at the PCP office.  Patient states that he has been feeling more unsteady in his gait for the last 2 to 3 weeks.  His cardiologist started him on Aldactone  around February 2025.  He does not take any loop diuretics on a daily basis.  He does however drink about 6-12 beers per day.  He buys 3-4 cases of beer at a time.  He also drinks rum.  He consumes about 2-3 bottles of water  a day.  He only eats twice a day.  He states that his wife cooks all of his meals.  He does not consume any additional salt.  Patient drank 2 beers before come to the ER.  The beer can sizes or 12 ounces each.  Patient states that he is never had any withdrawal symptoms before.  Even after he stopped drinking when he was getting radiation for his brain cancer.  Patient states that about 2 to 3 weeks ago he started feeling unsteady with his gait.  He had to start using a cane and then had to progress to using a rolling walker.  On arrival to the ER temp 98 heart rate 72 blood pressure 160/129 satting 95% on 3 L.  White count 7.7, hemoglobin 14.5, platelets 137  Sodium 117, Tessman 4.9, chloride of 81, bicarb 25, BUN 11, creatinine 0.68, glucose of 106  Calcium  9.4, total protein 7.5, albumin 4.2, AST of 74, ALT of 71, alk phos of 134, total bili 1.4  UA showed a specific  gravity of less than 1.005.  pH of 5.5.  Large hemoglobin.  Ketones 15.  Nitrate negative, leukocyte esterase negative.  Urine osmolality of 228, serum osmolality of 257, urine sodium of less than 30  Chest x-ray showed cardiomegaly with central vascular prominence  CT head showed focal encephalomalacia in the superior left frontal lobe unchanged from his prior MRI from February 2025  Right upper Quadrant ultrasound showed hepatic steatosis.  There is no Perry cholecystic fluid or wall thickening.  No cholelithiasis.  Common bile duct size was normal at 3 mm.  Significant Events: Admitted 05/12/2024 for acute hyponatremia due to beer potomania and lack of solute intake.   Admission Labs: White count 7.7, hemoglobin 14.5, platelets 137 Sodium 117, Tessman 4.9, chloride of 81, bicarb 25, BUN 11, creatinine 0.68, glucose of 106 Calcium  9.4, total protein 7.5, albumin 4.2, AST of 74, ALT of 71, alk phos of 134, total bili 1.4 UA showed a specific gravity of less than 1.005.  pH of 5.5.  Large hemoglobin.  Ketones 15.  Nitrate negative, leukocyte esterase negative. Urine osmolality of 228, serum osmolality of 257, urine sodium of less than 30  Admission Imaging Studies: Chest x-ray showed cardiomegaly with central vascular prominence  CT head showed focal encephalomalacia in the superior left frontal lobe unchanged from his prior  MRI from February 2025  Right upper Quadrant ultrasound showed hepatic steatosis.  There is no Perry cholecystic fluid or wall thickening.  No cholelithiasis.  Common bile duct size was normal at 3 mm.  Significant Labs:   Significant Imaging Studies:   Antibiotic Therapy: Anti-infectives (From admission, onward)    None       Procedures:   Consultants:     ED Course: Na 117. Started on IV NS.  Review of Systems:  Review of Systems  Constitutional: Negative.   HENT: Negative.    Eyes: Negative.   Respiratory:         Chronic dyspnea on  exertion unchanged.  Cardiovascular: Negative.   Gastrointestinal: Negative.   Genitourinary: Negative.   Musculoskeletal: Negative.   Skin: Negative.   Neurological:        Wobbly and unsteady gait for the last 2 or 3 weeks.  Endo/Heme/Allergies: Negative.   Psychiatric/Behavioral:         Consumes about 12 beers per day on a continuous basis.  Also drinking rum liquor on top of this.  All other systems reviewed and are negative.   Past Medical History:  Diagnosis Date   Arthritis    Brain tumor [facility])    COPD (chronic obstructive pulmonary disease) (HCC)    Diabetes mellitus without complication (HCC)    patient states he was taken off meds at last appointment with PCP and is diet controlled   Dyspnea    Ejection fraction < 50% 2021   Gout    Hypertension 08/02/2017   Pneumonia     Past Surgical History:  Procedure Laterality Date   APPLICATION OF CRANIAL NAVIGATION Left 08/30/2017   Procedure: APPLICATION OF CRANIAL NAVIGATION;  Surgeon: Unice Pac, MD;  Location: [facility] OR;  Service: Neurosurgery;  Laterality: Left;   IR IMAGING GUIDED PORT INSERTION  12/27/2017   IR REMOVAL TUN ACCESS W/ PORT W/O FL MOD SED  11/14/2019   STERIOTACTIC STIMULATOR INSERTION Left 08/30/2017   Procedure: LEFT Frontal Sterotactic Brain Biopsy with BrainLab;  Surgeon: Unice Pac, MD;  Location: [facility] OR;  Service: Neurosurgery;  Laterality: Left;     reports that he quit smoking about 12 years ago. His smoking use included cigarettes. He started smoking about 52 years ago. He has a 80 pack-year smoking history. He has never used smokeless tobacco. He reports current alcohol use of about 14.0 standard drinks of alcohol per week. He reports that he does not use drugs.  Allergies[1]  Family History  Problem Relation Age of Onset   Cancer Brother     Prior to Admission medications  Medication Sig Start Date End Date Taking? Authorizing Provider  allopurinol  (ZYLOPRIM ) 300 MG tablet Take 300 mg by  mouth daily.    [provider]  allopurinol  (ZYLOPRIM ) 300 MG tablet Take 1 tablet (300 mg total) by mouth daily. 04/12/24     b complex vitamins tablet Take 1 tablet by mouth daily.    [provider]  blood glucose meter kit and supplies KIT Dispense based on patient and insurance preference. Use up to four times daily as directed. (FOR ICD-9 250.00, 250.01). 12/13/17   Sherrill Cable Latif, DO  diclofenac  Sodium (VOLTAREN ) 1 % GEL Apply 4 g topically 2 (two) times a day. Apply to knees as needed. 02/15/24     empagliflozin  (JARDIANCE ) 10 MG TABS tablet Take 1 tablet (10 mg total) by mouth daily before breakfast. 09/22/23   Kate Lonni CROME, MD  empagliflozin  (  JARDIANCE ) 10 MG TABS tablet Take 1 tablet (10 mg total) by mouth daily before breakfast 10/12/23     glucose blood (ONETOUCH VERIO) test strip Use to test fasting blood sugar daily. 02/15/24     glucose blood (PRECISION QID TEST) test strip Use as instructed 02/15/24     Insulin  Isophane & Regular Human (NOVOLIN 70/30 FLEXPEN) (70-30) 100 UNIT/ML PEN Inject 15 Units into the skin 2 (two) times daily. 01/07/18   Sherrill Cable Latif, DO  Insulin  Pen Needle (PEN NEEDLES 3/16) 31G X 5 MM MISC 1 Container by Does not apply route 2 (two) times daily. 01/07/18   Sheikh, [name], DO  metoprolol  succinate (TOPROL -XL) 50 MG 24 hr tablet Take 1 tablet (50 mg total) by mouth daily with or immediately following a meal. 07/08/23   Kate Lonni CROME, MD  Multiple Vitamin (MULTIVITAMIN) tablet Take 1 tablet by mouth daily.    [provider]  [facility] VERIO test strip 1 each daily. 12/25/19   [provider]  rosuvastatin  (CRESTOR ) 5 MG tablet Take 1 tablet (5 mg total) by mouth daily. 05/06/23 08/04/23  Kate Lonni CROME, MD  rosuvastatin  (CRESTOR ) 5 MG tablet Take 1 tablet (5 mg total) by mouth daily. 02/24/24   Kate Lonni CROME, MD  sacubitril -valsartan  (ENTRESTO ) 49-51 MG Take 1 tablet by mouth 2  (two) times daily. 10/28/23   Kate Lonni CROME, MD  spironolactone  (ALDACTONE ) 25 MG tablet Take 0.5 tablets (12.5 mg total) by mouth daily. 06/15/23   Kate Lonni CROME, MD  tamsulosin  (FLOMAX ) 0.4 MG CAPS capsule Take 1 capsule (0.4 mg total) by mouth daily after dinner. 02/15/24     Tiotropium Bromide-Olodaterol (STIOLTO RESPIMAT ) 2.5-2.5 MCG/ACT AERS Inhale 2 puffs into the lungs daily. 09/23/23   Hunsucker, [name], MD    Physical Exam: Vitals:   05/13/24 1245 05/13/24 1258 05/13/24 1300 05/13/24 1400  BP:   (!) 140/63 (!) 155/57  Pulse: (!) 54 (!) 51 (!) 51 (!) 55  Resp: 18 18 15 14   Temp:    97.9 F (36.6 C)  TempSrc:      SpO2: 92% 98% 98% 97%  Weight:        Physical Exam Vitals and nursing note reviewed.  Constitutional:      General: He is not in acute distress.    Appearance: He is obese. He is not toxic-appearing or diaphoretic.  HENT:     Head: Normocephalic and atraumatic.     Nose: Nose normal.  Cardiovascular:     Rate and Rhythm: Normal rate and regular rhythm.  Pulmonary:     Effort: Pulmonary effort is normal.     Breath sounds: Normal breath sounds.  Abdominal:     General: Abdomen is protuberant. Bowel sounds are normal. There is no distension.     Palpations: Abdomen is soft.     Tenderness: There is no abdominal tenderness.  Musculoskeletal:     Right lower leg: Edema present.     Left lower leg: Edema present.     Comments: Trace pretibial, ankle and pedal edema bilaterally.  Skin:    Capillary Refill: Capillary refill takes less than 2 seconds.  Neurological:     General: No focal deficit present.     Mental Status: He is alert and oriented to person, place, and time.      Labs on Admission: I have personally reviewed following labs and imaging studies  CBC: Recent Labs  Lab 05/12/24 2318 05/13/24 0400  WBC  7.7 7.2  NEUTROABS 5.7  --   HGB 14.5 13.3  HCT 40.7 36.8*  MCV 88.5 88.9  PLT 137* 133*   Basic Metabolic  Panel: Recent Labs  Lab 05/12/24 2318 05/13/24 0257 05/13/24 0916  NA 117* 117* 119*  K 4.9 4.6 4.6  CL 81* 81* 83*  CO2 25 26 23   GLUCOSE 106* 104* 111*  BUN 11 10 11   CREATININE 0.68 0.70 0.69  CALCIUM  9.4 9.1 9.3   GFR: Estimated Creatinine Clearance: 133.2 mL/min (by C-G formula based on SCr of 0.69 mg/dL). Liver Function Tests: Recent Labs  Lab 05/12/24 2318  AST 74*  ALT 71*  ALKPHOS 134*  BILITOT 1.4*  PROT 7.5  ALBUMIN 4.2   HbA1C: Recent Labs    05/12/24 0230  HGBA1C 5.8*   CBG: Recent Labs  Lab 05/13/24 0213 05/13/24 0807 05/13/24 1152  GLUCAP 111* 91 120*   Urine analysis:    Component Value Date/Time   COLORURINE STRAW (A) 05/12/2024 0135   APPEARANCEUR CLEAR 05/12/2024 0135   LABSPEC <1.005 (L) 05/12/2024 0135   PHURINE 5.5 05/12/2024 0135   GLUCOSEU >=500 (A) 05/12/2024 0135   HGBUR LARGE (A) 05/12/2024 0135   BILIRUBINUR NEGATIVE 05/12/2024 0135   KETONESUR 15 (A) 05/12/2024 0135   PROTEINUR NEGATIVE 05/12/2024 0135   UROBILINOGEN 0.2 03/28/2007 1459   NITRITE NEGATIVE 05/12/2024 0135   LEUKOCYTESUR NEGATIVE 05/12/2024 0135    Radiological Exams on Admission: I have personally reviewed images US  Abdomen Limited RUQ (LIVER/GB) Result Date: 05/13/2024 EXAM: Right Upper Quadrant Abdominal Ultrasound 05/13/2024 09:35:00 AM TECHNIQUE: Real-time ultrasonography of the right upper quadrant of the abdomen was performed. COMPARISON: None available. CLINICAL HISTORY: Transaminitis. FINDINGS: LIVER: Hepatic steatosis. No intrahepatic biliary ductal dilatation. No evidence of mass. Patent portal vein with antegrade flow. Evaluation of the liver is limited due to bowel gas and body habitus. BILIARY SYSTEM: No pericholecystic fluid or wall thickening. No cholelithiasis. Common bile duct is within normal limits measuring 3 mm. Evaluation of the gallbladder is limited due to bowel gas and body habitus. Patient unable to hold breath during scan. OTHER: No  right upper quadrant ascites. IMPRESSION: 1. Hepatic steatosis. Electronically signed by: Norman Gatlin MD 05/13/2024 09:49 AM EST RP Workstation: [id number]   DG Chest Portable 1 View Result Date: 05/13/2024 EXAM: 1 VIEW(S) XRAY OF THE CHEST 05/13/2024 12:40:15 AM COMPARISON: 02/13/21 CLINICAL HISTORY: hyponatremia, increasing fatigue FINDINGS: LUNGS AND PLEURA: No focal pulmonary opacity. No pleural effusion. No pneumothorax. HEART AND MEDIASTINUM: Cardiomegaly. Central vascular prominence. Atherosclerotic plaque. BONES AND SOFT TISSUES: No acute osseous abnormality. IMPRESSION: 1. Cardiomegaly and central vascular prominence. Electronically signed by: Franky Stanford MD 05/13/2024 12:48 AM EST RP Workstation: [id number]   CT Head Wo Contrast Result Date: 05/13/2024 EXAM: CT HEAD WITHOUT 05/13/2024 12:41:31 AM TECHNIQUE: CT of the head was performed without the administration of intravenous contrast. Automated exposure control, iterative reconstruction, and/or weight based adjustment of the mA/kV was utilized to reduce the radiation dose to as low as reasonably achievable. COMPARISON: Brain MRI 07/08/2023. CLINICAL HISTORY: Hyponatremia, history of brain cancer. FINDINGS: BRAIN AND VENTRICLES: No acute intracranial hemorrhage. No mass effect or midline shift. No extra-axial fluid collection. No evidence of acute infarct. No hydrocephalus. Focal encephalomalacia in the superior left frontal lobe. ORBITS: No acute abnormality. SINUSES AND MASTOIDS: No acute abnormality. SOFT TISSUES AND SKULL: No acute skull fracture. No acute soft tissue abnormality. IMPRESSION: 1. No acute findings. 2. Focal encephalomalacia in the superior left frontal lobe, unchanged from  prior brain MRI. Electronically signed by: Franky Stanford MD 05/13/2024 12:44 AM EST RP Workstation: [id number]    EKG: My personal interpretation of EKG shows: no EKG to review  Assessment/Plan Principal Problem:   Hyponatremia Active Problems:   ETOH  abuse   Essential hypertension   COPD  GOLD III    Chronic respiratory failure with hypoxia (HCC) - 3 L/min   Primary CNS lymphoma (HCC)   Chronic combined systolic and diastolic heart failure (HCC)   Obesity, Class II, BMI 35-39.9   DNR (do not resuscitate)/DNI(Do Not Intubate)   Obstructive sleep apnea treated with BiPAP    Assessment and Plan: * Hyponatremia 05/13/2024 I think his hyponatremia is due to low solute intake along with beer Poto mania.  Evidenced by a very low urine sodium.  He has corresponding low serum osmolality and urine osmolality.  In review of his medical chart, it does not appear that he had any CNS radiation when he was treated for CNS lymphoma.  Will check a TSH and free T4.  I do not think checking ACTH would be of any benefit.  He does not have any evidence of adrenal insufficiency.  Will water  restrict him and place him on some salt tablets 1 g tid.  Check BMP every 6 hours x 3.  He is being admitted to stepdown ICU at [facility] room 1225   ETOH abuse 05/13/2024 patient drinks about 12 beers a day.  He also consumes rum.  Patient states he has never had any withdrawal symptoms.  Placed him on CIWA protocol.   Obstructive sleep apnea treated with BiPAP 05/13/2024 continue with BiPAP at night while sleeping.  He states he has been wearing this for about 3 months now.   DNR (do not resuscitate)/DNI(Do Not Intubate) 05/13/2024 verified the patient is a DNR/DNI.  He states he has a DNR form at home.   Obesity, Class II, BMI 35-39.9 05/13/2024 Body mass index is 37.5 kg/m.    Chronic combined systolic and diastolic heart failure (HCC) 05/13/2024 continue him on his guideline directed medical therapy except for holding his Aldactone  due to his hyponatremia.   Primary CNS lymphoma (HCC) 05/13/2024 he was last seen by his oncologist several months ago.  He is about 5 years out from his diagnosis of CNS lymphoma and treatment.  Oncology feels that he has a low risk of  recurrence.   Chronic respiratory failure with hypoxia (HCC) - 3 L/min 05/13/2024 continue him on his oxygen  at 3 L a minute.  He is chronically on home O2.   COPD  GOLD III  05/13/2024 continue him on his Brovana  nebulizer treatments and his Incruse Ellipta  inhaler.   Essential hypertension 05/13/2024 continue with Toprol -XL 50 mg daily.  Hold his Aldactone  while he is hyponatremic.  Continue him on his Entresto .  He does not have any renal failure.    DVT prophylaxis: SQ Heparin  Code Status: DNR/DNI(Do NOT Intubate). Verified with pt. He is competent to make medical decisions for himself Family Communication: no family members at bedside. Pt states he has a common-law significant other([name] Ezzell) that he calls his wife but they are not legally married Disposition Plan: home  Consults called: none  Admission status: Inpatient, Step Down Unit   Camellia Door, DO [facility] 05/13/2024, 4:04 PM       [1] No Known Allergies  "

## 2024-05-13 NOTE — ED Notes (Signed)
 ED Provider at bedside.

## 2024-05-13 NOTE — ED Notes (Signed)
 Offered breakfast Patient given juice, coffee and granola bar ( per patient preference)

## 2024-05-13 NOTE — Assessment & Plan Note (Addendum)
 05/13/2024 continue him on his guideline directed medical therapy except for holding his Aldactone  due to his hyponatremia.

## 2024-05-13 NOTE — Assessment & Plan Note (Addendum)
 05/13/2024 patient drinks about 12 beers a day.  He also consumes rum.  Patient states he has never had any withdrawal symptoms.  Placed him on CIWA protocol.

## 2024-05-13 NOTE — Assessment & Plan Note (Addendum)
 05/13/2024 verified the patient is a DNR/DNI.  He states he has a DNR form at home.

## 2024-05-13 NOTE — Assessment & Plan Note (Addendum)
 05/13/2024 continue with BiPAP at night while sleeping.  He states he has been wearing this for about 3 months now.

## 2024-05-13 NOTE — Assessment & Plan Note (Addendum)
 05/13/2024 continue with Toprol -XL 50 mg daily.  Hold his Aldactone  while he is hyponatremic.  Continue him on his Entresto .  He does not have any renal failure.

## 2024-05-13 NOTE — ED Notes (Signed)
"  US  at bedside.   "

## 2024-05-13 NOTE — Progress Notes (Signed)
" ° °  Most recent bmp show increase of Na 119-->123. Pt now off IVF NS. Will decrease Na tabs to 1 gm bid. Repeat BMP around 10 pm.     Latest Ref Rng & Units 05/13/2024    4:41 PM 05/13/2024    9:16 AM 05/13/2024    2:57 AM  BMP  Glucose 70 - 99 mg/dL 862  888  895   BUN 8 - 23 mg/dL 12  11  10    Creatinine 0.61 - 1.24 mg/dL 9.31  9.30  9.29   Sodium 135 - 145 mmol/L 123  119  117   Potassium 3.5 - 5.1 mmol/L 4.9  4.6  4.6   Chloride 98 - 111 mmol/L 84  83  81   CO2 22 - 32 mmol/L 23  23  26    Calcium  8.9 - 10.3 mg/dL 8.8  9.3  9.1      Camellia Door, DO [facility] "

## 2024-05-13 NOTE — Assessment & Plan Note (Addendum)
 05/13/2024 Body mass index is 37.5 kg/m.

## 2024-05-13 NOTE — ED Notes (Signed)
 Called [facility] to transport the patient to [facility] rm# [id number]

## 2024-05-13 NOTE — Progress Notes (Signed)
 Progress Note  69 year old male with a history of hypertension, T2DM, chronic respiratory failure on home oxygen  3LPM, history of combined systolic/diastolic heart failure, COPD, obesity who was admitted virtually earlier today due to hyponatremia.  Apparently, patient did a lab work at his PCPs office and sodium level was noted to be 118 and was asked to go to ED for further evaluation.  Patient also complained of 2 to 3-week onset of abnormal gait with unsteadiness. Patient has a history of alcohol consumption with 6-12 12oz beers daily and also drinks rum.  He endorsed drinking 2-3 bottles of water  daily as he tries to avoid salt due to hypertension.  He was on spironolactone .  At bedside, patient was having dinner.  He was in no acute distress.  Patient's lab was reviewed and showed hyponatremia and hypoosmolarity.  Chest x-ray showed cardiomegaly with central vascular prominence.  BP (!) 170/52   Pulse (!) 55   Temp 98 F (36.7 C) (Oral)   Resp (!) 21   Ht 6' 3 (1.905 m)   Wt 133.8 kg   SpO2 95%   BMI 36.87 kg/m    He was admitted for hyponatremia and was already started on sodium tablet 1 g 3 times daily, which has been reduced to twice daily due to sodium level increased to 117 from 123. Patient was also already placed on CIWA protocol due to history of alcohol abuse Continue supplemental oxygen  via [place] per home regimen (3 LPM) due to history of chronic respiratory failure with hypoxia BiPAP will be continued due to OSA.  He states that he has been compliant with this since last 3 months   This is a same-day admission.  Continue to monitor patient and treat accordingly  Please refer to detailed H&P by Dr. Laurence.

## 2024-05-13 NOTE — Assessment & Plan Note (Addendum)
 05/13/2024 continue him on his Brovana  nebulizer treatments and his Incruse Ellipta  inhaler.

## 2024-05-13 NOTE — Assessment & Plan Note (Addendum)
 05/13/2024 I think his hyponatremia is due to low solute intake along with beer Poto mania.  Evidenced by a very low urine sodium.  He has corresponding low serum osmolality and urine osmolality.  In review of his medical chart, it does not appear that he had any CNS radiation when he was treated for CNS lymphoma.  Will check a TSH and free T4.  I do not think checking ACTH would be of any benefit.  He does not have any evidence of adrenal insufficiency.  Will water  restrict him and place him on some salt tablets 1 g tid.  Check BMP every 6 hours x 3.  He is being admitted to stepdown ICU at [facility] room 1225

## 2024-05-13 NOTE — Plan of Care (Addendum)
 [facility] emergency department Tim [name] or [facility] [name] stepdown unit transfer:   68 year old man past medical history of remote history of brain cancer, HFrEF 40 to 45%, essential hypertension, insulin -dependent DM type II, peripheral neuropathy, depression, osteoarthritis, and gout presents emergency department as outpatient lab work informed him that sodium is 118.  Patient reported generalized weakness and fatigue for more than a month.  At presentation to ED patient found borderline hypertensive otherwise hemodynamically stable. Lab, CMP showing low sodium 117, low chloride 81, elevated AST/ALT/ALP 74/71/134 and elevated bilirubin 1.4. CBC unremarkable. Pending UA, serum osmolarity urine osmolarity and urine sodium.  CT head no acute intracranial abnormality. Focal encephalomalacia in the superior left frontal lobe, unchanged from prior brain MRI.  Chest x-ray showing cardiomegaly and central vascular prominence.  In the ED NS 75 cc/h has been initiated.   Hospitalist consulted for further evaluation management of hyponatremia and transaminitis. Obtaining hepatic ultrasound in the setting of transaminitis.   [facility] will assume care on arrival to accepting facility. Until arrival, care as per EDP. However, [facility] available 24/7 for questions and assistance. Check [email or web address] for on-call coverage. Nursing staff, please call [facility] Admits & Consults System-Wide number under Amion on patient's arrival so appropriate admitting provider can evaluate the pt.   Author: Darrnell Mangiaracina, MD  [facility]

## 2024-05-13 NOTE — Assessment & Plan Note (Addendum)
 05/13/2024 he was last seen by his oncologist several months ago.  He is about 5 years out from his diagnosis of CNS lymphoma and treatment.  Oncology feels that he has a low risk of recurrence.

## 2024-05-13 NOTE — ED Provider Notes (Signed)
 " [place] EMERGENCY DEPARTMENT AT [facility] Provider Note   CSN: [id number] Arrival date & time: 05/12/24  2305     Patient presents with: Abnormal Labs   Tim [name] is a 69 y.o. male.   HPI     This is a 69 year old male who presents with concern for abnormal lab.  History of COPD and prior history of brain cancer.  Reports that he saw his primary doctor today and had lab work.  He got a phone call that his sodium was critically low.  Reports that it was 118.  No prior history of issues with his sodium.  Denies any new medications.  Patient states that over the last 3 to 4 weeks he has not felt great.  He reports having memory issues and generalized malaise and fatigue.  She states it actually feels similar to when he was diagnosed with his brain cancer.  He has experienced 2 falls during this time.  No nausea or vomiting.  No change in appetite.  Prior to Admission medications  Medication Sig Start Date End Date Taking? Authorizing Provider  allopurinol  (ZYLOPRIM ) 300 MG tablet Take 300 mg by mouth daily.    [provider]  allopurinol  (ZYLOPRIM ) 300 MG tablet Take 1 tablet (300 mg total) by mouth daily. 04/12/24     b complex vitamins tablet Take 1 tablet by mouth daily.    [provider]  blood glucose meter kit and supplies KIT Dispense based on patient and insurance preference. Use up to four times daily as directed. (FOR ICD-9 250.00, 250.01). 12/13/17   Sherrill Cable Latif, DO  diclofenac  Sodium (VOLTAREN ) 1 % GEL Apply 4 g topically 2 (two) times a day. Apply to knees as needed. 02/15/24     empagliflozin  (JARDIANCE ) 10 MG TABS tablet Take 1 tablet (10 mg total) by mouth daily before breakfast. 09/22/23   [name], MD  empagliflozin  (JARDIANCE ) 10 MG TABS tablet Take 1 tablet (10 mg total) by mouth daily before breakfast 10/12/23     glucose blood (ONETOUCH VERIO) test strip Use to test fasting blood sugar daily. 02/15/24     glucose  blood (PRECISION QID TEST) test strip Use as instructed 02/15/24     Insulin  Isophane & Regular Human (NOVOLIN 70/30 FLEXPEN) (70-30) 100 UNIT/ML PEN Inject 15 Units into the skin 2 (two) times daily. 01/07/18   Sherrill Cable Latif, DO  Insulin  Pen Needle (PEN NEEDLES 3/16) 31G X 5 MM MISC 1 Container by Does not apply route 2 (two) times daily. 01/07/18   Sheikh, [name], DO  metoprolol  succinate (TOPROL -XL) 50 MG 24 hr tablet Take 1 tablet (50 mg total) by mouth daily with or immediately following a meal. 07/08/23   [name], MD  Multiple Vitamin (MULTIVITAMIN) tablet Take 1 tablet by mouth daily.    [provider]  [facility] VERIO test strip 1 each daily. 12/25/19   [provider]  rosuvastatin  (CRESTOR ) 5 MG tablet Take 1 tablet (5 mg total) by mouth daily. 05/06/23 08/04/23  [name], MD  rosuvastatin  (CRESTOR ) 5 MG tablet Take 1 tablet (5 mg total) by mouth daily. 02/24/24   [name], MD  sacubitril -valsartan  (ENTRESTO ) 49-51 MG Take 1 tablet by mouth 2 (two) times daily. 10/28/23   [name], MD  spironolactone  (ALDACTONE ) 25 MG tablet Take 0.5 tablets (12.5 mg total) by mouth daily. 06/15/23   [name], MD  tamsulosin  (FLOMAX ) 0.4 MG CAPS  capsule Take 1 capsule (0.4 mg total) by mouth daily after dinner. 02/15/24     Tiotropium Bromide-Olodaterol (STIOLTO RESPIMAT ) 2.5-2.5 MCG/ACT AERS Inhale 2 puffs into the lungs daily. 09/23/23   Hunsucker, [name], MD    Allergies: Patient has no known allergies.    Review of Systems  Constitutional:  Positive for fatigue. Negative for fever.  Respiratory:  Negative for shortness of breath.   Cardiovascular:  Negative for chest pain.  Gastrointestinal:  Negative for abdominal pain, diarrhea, nausea and vomiting.  All other systems reviewed and are negative.   Updated Vital Signs BP (!) 149/68   Pulse 61   Temp 98 F (36.7 C)   Resp 18   Wt 136.1 kg    SpO2 96%   BMI 37.50 kg/m   Physical Exam Vitals and nursing note reviewed.  Constitutional:      Appearance: He is well-developed. He is obese.     Comments: Chronically ill-appearing  HENT:     Head: Normocephalic and atraumatic.  Eyes:     Pupils: Pupils are equal, round, and reactive to light.  Cardiovascular:     Rate and Rhythm: Normal rate and regular rhythm.     Heart sounds: Normal heart sounds. No murmur heard. Pulmonary:     Effort: Pulmonary effort is normal. No respiratory distress.     Breath sounds: Normal breath sounds. No wheezing.     Comments: Nasal cannula in place Abdominal:     General: Bowel sounds are normal.     Palpations: Abdomen is soft.     Tenderness: There is no abdominal tenderness. There is no rebound.  Musculoskeletal:     Cervical back: Neck supple.     Right lower leg: Edema present.     Left lower leg: Edema present.  Lymphadenopathy:     Cervical: No cervical adenopathy.  Skin:    General: Skin is warm and dry.  Neurological:     Mental Status: He is alert and oriented to person, place, and time.  Psychiatric:        Mood and Affect: Mood normal.     (all labs ordered are listed, but only abnormal results are displayed) Labs Reviewed  CBC WITH DIFFERENTIAL/PLATELET - Abnormal; Notable for the following components:      Result Value   Platelets 137 (*)    Monocytes Absolute 1.2 (*)    All other components within normal limits  COMPREHENSIVE METABOLIC PANEL WITH GFR - Abnormal; Notable for the following components:   Sodium 117 (*)    Chloride 81 (*)    Glucose, Bld 106 (*)    AST 74 (*)    ALT 71 (*)    Alkaline Phosphatase 134 (*)    Total Bilirubin 1.4 (*)    All other components within normal limits  URINALYSIS, ROUTINE W REFLEX MICROSCOPIC - Abnormal; Notable for the following components:   Color, Urine STRAW (*)    Specific Gravity, Urine <1.005 (*)    Glucose, UA >=500 (*)    Hgb urine dipstick LARGE (*)     Ketones, ur 15 (*)    All other components within normal limits  CBG MONITORING, ED - Abnormal; Notable for the following components:   Glucose-Capillary 111 (*)    All other components within normal limits  URINALYSIS, MICROSCOPIC (REFLEX)  OSMOLALITY  OSMOLALITY, URINE  SODIUM, URINE, RANDOM  BASIC METABOLIC PANEL WITH GFR  BASIC METABOLIC PANEL WITH GFR  BASIC METABOLIC PANEL WITH GFR  BASIC METABOLIC PANEL WITH GFR  CBC  HEMOGLOBIN A1C  HEPATITIS PANEL, ACUTE    EKG: None  Radiology: DG Chest Portable 1 View Result Date: 05/13/2024 EXAM: 1 VIEW(S) XRAY OF THE CHEST 05/13/2024 12:40:15 AM COMPARISON: 02/13/21 CLINICAL HISTORY: hyponatremia, increasing fatigue FINDINGS: LUNGS AND PLEURA: No focal pulmonary opacity. No pleural effusion. No pneumothorax. HEART AND MEDIASTINUM: Cardiomegaly. Central vascular prominence. Atherosclerotic plaque. BONES AND SOFT TISSUES: No acute osseous abnormality. IMPRESSION: 1. Cardiomegaly and central vascular prominence. Electronically signed by: Franky Stanford MD 05/13/2024 12:48 AM EST RP Workstation: [id number]   CT Head Wo Contrast Result Date: 05/13/2024 EXAM: CT HEAD WITHOUT 05/13/2024 12:41:31 AM TECHNIQUE: CT of the head was performed without the administration of intravenous contrast. Automated exposure control, iterative reconstruction, and/or weight based adjustment of the mA/kV was utilized to reduce the radiation dose to as low as reasonably achievable. COMPARISON: Brain MRI 07/08/2023. CLINICAL HISTORY: Hyponatremia, history of brain cancer. FINDINGS: BRAIN AND VENTRICLES: No acute intracranial hemorrhage. No mass effect or midline shift. No extra-axial fluid collection. No evidence of acute infarct. No hydrocephalus. Focal encephalomalacia in the superior left frontal lobe. ORBITS: No acute abnormality. SINUSES AND MASTOIDS: No acute abnormality. SOFT TISSUES AND SKULL: No acute skull fracture. No acute soft tissue abnormality. IMPRESSION: 1. No  acute findings. 2. Focal encephalomalacia in the superior left frontal lobe, unchanged from prior brain MRI. Electronically signed by: Franky Stanford MD 05/13/2024 12:44 AM EST RP Workstation: [id number]     .Critical Care  Performed by: Bari Charmaine FALCON, MD Authorized by: Bari Charmaine FALCON, MD   Critical care provider statement:    Critical care time (minutes):  45   Critical care was necessary to treat or prevent imminent or life-threatening deterioration of the following conditions: Severe hyponatremia.   Critical care was time spent personally by me on the following activities:  Development of treatment plan with patient or surrogate, discussions with consultants, evaluation of patient's response to treatment, examination of patient, ordering and review of laboratory studies, ordering and review of radiographic studies, ordering and performing treatments and interventions, pulse oximetry, re-evaluation of patient's condition and review of old charts    Medications Ordered in the ED  allopurinol  (ZYLOPRIM ) tablet 300 mg (has no administration in time range)  empagliflozin  (JARDIANCE ) tablet 10 mg (has no administration in time range)  metoprolol  succinate (TOPROL -XL) 24 hr tablet 50 mg (has no administration in time range)  tamsulosin  (FLOMAX ) capsule 0.4 mg (has no administration in time range)  arformoterol  (BROVANA ) nebulizer solution 15 mcg (has no administration in time range)    And  umeclidinium bromide  (INCRUSE ELLIPTA ) 62.5 MCG/ACT 1 puff (has no administration in time range)  insulin  aspart (novoLOG ) injection 0-6 Units (has no administration in time range)  insulin  aspart (novoLOG ) injection 0-5 Units (has no administration in time range)  0.9 %  sodium chloride  infusion ( Intravenous New Bag/Given 05/13/24 0053)                                    Medical Decision Making Amount and/or Complexity of Data Reviewed Labs: ordered. Radiology: ordered.  Risk Prescription  drug management. Decision regarding hospitalization.   This patient presents to the ED for concern of abnormal lab, this involves an extensive number of treatment options, and is a complaint that carries with it a high risk of complications and morbidity.  I considered the following differential and admission for  this acute, potentially life threatening condition.  The differential diagnosis includes hyponatremia due to SIADH and recurrent cancer, kidney failure, heart failure, medication related  MDM:    This is a 69 year old male who presents with concern for low sodium.  Has not felt well in the last 3 to 4 weeks.  History of brain cancer.  He is nontoxic and vital signs are largely reassuring.  Otherwise asymptomatic.  Confirm sodium here is 117.  Likely slow drop given several weeks of symptoms.  No seizures.  No indication for hypertonic saline.  He was started on a normal saline infusion for slow correction.  Added serum osmolality and urine studies.  He does maybe appear slightly volume overloaded.  He is on his baseline O2 requirement.  CT head does not show any evidence of recurrent large tumor.  Chest x-ray shows no evidence of lung mass but does show some vascular prominence.  Will plan for admission for hyponatremia.  Discussed with hospitalist.  Repeat BMP at 4 AM.  (Labs, imaging, consults)  Labs: I Ordered, and personally interpreted labs.  The pertinent results include: CBC, BMP, serum osmolality, urine studies  Imaging Studies ordered: I ordered imaging studies including CT head, chest x-ray I independently visualized and interpreted imaging. I agree with the radiologist interpretation  Additional history obtained from chart review.  External records from outside source obtained and reviewed including prior evaluations  Cardiac Monitoring: The patient was maintained on a cardiac monitor.  If on the cardiac monitor, I personally viewed and interpreted the cardiac monitored  which showed an underlying rhythm of: NS  Reevaluation: After the interventions noted above, I reevaluated the patient and found that they have :stayed the same  Social Determinants of Health:  lives independently  Disposition: Admit  Co morbidities that complicate the patient evaluation  Past Medical History:  Diagnosis Date   Arthritis    Brain tumor (HCC)    COPD (chronic obstructive pulmonary disease) (HCC)    Diabetes mellitus without complication (HCC)    patient states he was taken off meds at last appointment with PCP and is diet controlled   Dyspnea    Ejection fraction < 50% 2021   Gout    Hypertension 08/02/2017   Pneumonia      Medicines Meds ordered this encounter  Medications   0.9 %  sodium chloride  infusion   allopurinol  (ZYLOPRIM ) tablet 300 mg   DISCONTD: b complex vitamins tablet 1 tablet   empagliflozin  (JARDIANCE ) tablet 10 mg   metoprolol  succinate (TOPROL -XL) 24 hr tablet 50 mg   DISCONTD: rosuvastatin  (CRESTOR ) tablet 5 mg   tamsulosin  (FLOMAX ) capsule 0.4 mg   AND Linked Order Group    arformoterol  (BROVANA ) nebulizer solution 15 mcg    umeclidinium bromide  (INCRUSE ELLIPTA ) 62.5 MCG/ACT 1 puff   insulin  aspart (novoLOG ) injection 0-6 Units    Correction coverage::   Very Sensitive (ESRD/Dialysis)    CBG < 70::   Implement Hypoglycemia Standing Orders and refer to Hypoglycemia Standing Orders sidebar report    CBG 70 - 120::   0 units    CBG 121 - 150::   0 units    CBG 151 - 200::   1 unit    CBG 201-250::   2 units    CBG 251-300::   3 units    CBG 301-350::   4 units    CBG 351-400::   5 units    CBG > 400:   Give 6 units and call  MD   insulin  aspart (novoLOG ) injection 0-5 Units    Correction coverage::   HS scale    CBG < 70::   Implement Hypoglycemia Standing Orders and refer to Hypoglycemia Standing Orders sidebar report    CBG 70 - 120::   0 units    CBG 121 - 150::   0 units    CBG 151 - 200::   0 units    CBG 201 - 250::   2  units    CBG 251 - 300::   3 units    CBG 301 - 350::   4 units    CBG 351 - 400::   5 units    CBG > 400:   call MD and obtain STAT lab verification    I have reviewed the patients home medicines and have made adjustments as needed  Problem List / ED Course: Problem List Items Addressed This Visit       Other   * (Principal) Hyponatremia - Primary             Final diagnoses:  Hyponatremia    ED Discharge Orders     None          Bari Charmaine FALCON, MD 05/13/24 0304  "

## 2024-05-13 NOTE — Hospital Course (Signed)
 CC: abnormal labs, Na 64 HPI: 69 year old male with a history of combined systolic/diastolic heart failure, COPD, chronic respiratory failure on home oxygen  at 3 L a minute, OSA on BiPAP, history of CNS lymphoma, type 2 diabetes on insulin , essential hypertension, obesity with a BMI of 37.5 who presents to the ER today with an abnormally low sodium of 118 drawn at the PCP office.  Patient states that he has been feeling more unsteady in his gait for the last 2 to 3 weeks.  His cardiologist started him on Aldactone  around February 2025.  He does not take any loop diuretics on a daily basis.  He does however drink about 6-12 beers per day.  He buys 3-4 cases of beer at a time.  He also drinks rum.  He consumes about 2-3 bottles of water  a day.  He only eats twice a day.  He states that his wife cooks all of his meals.  He does not consume any additional salt.  Patient drank 2 beers before come to the ER.  The beer can sizes or 12 ounces each.  Patient states that he is never had any withdrawal symptoms before.  Even after he stopped drinking when he was getting radiation for his brain cancer.  Patient states that about 2 to 3 weeks ago he started feeling unsteady with his gait.  He had to start using a cane and then had to progress to using a rolling walker.  On arrival to the ER temp 98 heart rate 72 blood pressure 160/129 satting 95% on 3 L.  White count 7.7, hemoglobin 14.5, platelets 137  Sodium 117, Tessman 4.9, chloride of 81, bicarb 25, BUN 11, creatinine 0.68, glucose of 106  Calcium  9.4, total protein 7.5, albumin 4.2, AST of 74, ALT of 71, alk phos of 134, total bili 1.4  UA showed a specific gravity of less than 1.005.  pH of 5.5.  Large hemoglobin.  Ketones 15.  Nitrate negative, leukocyte esterase negative.  Urine osmolality of 228, serum osmolality of 257, urine sodium of less than 30  Chest x-ray showed cardiomegaly with central vascular prominence  CT head showed focal  encephalomalacia in the superior left frontal lobe unchanged from his prior MRI from February 2025  Right upper Quadrant ultrasound showed hepatic steatosis.  There is no Perry cholecystic fluid or wall thickening.  No cholelithiasis.  Common bile duct size was normal at 3 mm.  Significant Events: Admitted 05/12/2024 for acute hyponatremia due to beer potomania and lack of solute intake.   Admission Labs: White count 7.7, hemoglobin 14.5, platelets 137 Sodium 117, Tessman 4.9, chloride of 81, bicarb 25, BUN 11, creatinine 0.68, glucose of 106 Calcium  9.4, total protein 7.5, albumin 4.2, AST of 74, ALT of 71, alk phos of 134, total bili 1.4 UA showed a specific gravity of less than 1.005.  pH of 5.5.  Large hemoglobin.  Ketones 15.  Nitrate negative, leukocyte esterase negative. Urine osmolality of 228, serum osmolality of 257, urine sodium of less than 30  Admission Imaging Studies: Chest x-ray showed cardiomegaly with central vascular prominence  CT head showed focal encephalomalacia in the superior left frontal lobe unchanged from his prior MRI from February 2025  Right upper Quadrant ultrasound showed hepatic steatosis.  There is no Perry cholecystic fluid or wall thickening.  No cholelithiasis.  Common bile duct size was normal at 3 mm.  Significant Labs:   Significant Imaging Studies:   Antibiotic Therapy: Anti-infectives (From admission, onward)  None       Procedures:   Consultants:

## 2024-05-13 NOTE — Assessment & Plan Note (Addendum)
 05/13/2024 continue him on his oxygen  at 3 L a minute.  He is chronically on home O2.

## 2024-05-14 LAB — BASIC METABOLIC PANEL WITH GFR
Anion gap: 10 (ref 5–15)
Anion gap: 11 (ref 5–15)
Anion gap: 11 (ref 5–15)
BUN: 11 mg/dL (ref 8–23)
BUN: 11 mg/dL (ref 8–23)
BUN: 13 mg/dL (ref 8–23)
CO2: 26 mmol/L (ref 22–32)
CO2: 25 mmol/L (ref 22–32)
CO2: 27 mmol/L (ref 22–32)
Calcium: 9 mg/dL (ref 8.9–10.3)
Calcium: 9.1 mg/dL (ref 8.9–10.3)
Calcium: 9.2 mg/dL (ref 8.9–10.3)
Chloride: 89 mmol/L — ABNORMAL LOW (ref 98–111)
Chloride: 89 mmol/L — ABNORMAL LOW (ref 98–111)
Chloride: 86 mmol/L — ABNORMAL LOW (ref 98–111)
Creatinine, Ser: 0.73 mg/dL (ref 0.61–1.24)
Creatinine, Ser: 0.71 mg/dL (ref 0.61–1.24)
Creatinine, Ser: 0.87 mg/dL (ref 0.61–1.24)
GFR, Estimated: >60 mL/min
GFR, Estimated: >60 mL/min
GFR, Estimated: >60 mL/min
Glucose, Bld: 112 mg/dL — ABNORMAL HIGH (ref 70–99)
Glucose, Bld: 100 mg/dL — ABNORMAL HIGH (ref 70–99)
Glucose, Bld: 168 mg/dL — ABNORMAL HIGH (ref 70–99)
Potassium: 4.7 mmol/L (ref 3.5–5.1)
Potassium: 4.4 mmol/L (ref 3.5–5.1)
Potassium: 4.4 mmol/L (ref 3.5–5.1)
Sodium: 125 mmol/L — ABNORMAL LOW (ref 135–145)
Sodium: 125 mmol/L — ABNORMAL LOW (ref 135–145)
Sodium: 124 mmol/L — ABNORMAL LOW (ref 135–145)

## 2024-05-14 LAB — HIV ANTIBODY (ROUTINE TESTING W REFLEX): HIV Screen 4th Generation wRfx: NONREACTIVE

## 2024-05-14 LAB — GLUCOSE, CAPILLARY
Glucose-Capillary: 123 mg/dL — ABNORMAL HIGH (ref 70–99)
Glucose-Capillary: 154 mg/dL — ABNORMAL HIGH (ref 70–99)
Glucose-Capillary: 157 mg/dL — ABNORMAL HIGH (ref 70–99)

## 2024-05-14 MED ORDER — FUROSEMIDE 10 MG/ML IJ SOLN
40.0000 mg | Freq: Two times a day (BID) | INTRAMUSCULAR | Status: DC
  Administered 2024-05-14 – 2024-05-15 (×3): 40 mg via INTRAVENOUS
  Filled 2024-05-14 (×3): qty 4

## 2024-05-14 MED ORDER — EMPAGLIFLOZIN 10 MG PO TABS
10.0000 mg | ORAL_TABLET | Freq: Every day | ORAL | Status: DC
  Administered 2024-05-14 – 2024-05-15 (×2): 10 mg via ORAL
  Filled 2024-05-14 (×2): qty 1

## 2024-05-14 MED ORDER — CEPHALEXIN 500 MG PO CAPS
500.0000 mg | ORAL_CAPSULE | Freq: Three times a day (TID) | ORAL | Status: DC
  Administered 2024-05-14 – 2024-05-15 (×3): 500 mg via ORAL
  Filled 2024-05-14 (×3): qty 1

## 2024-05-14 MED ORDER — SPIRONOLACTONE 12.5 MG HALF TABLET
12.5000 mg | ORAL_TABLET | Freq: Every day | ORAL | Status: DC
  Administered 2024-05-14 – 2024-05-15 (×2): 12.5 mg via ORAL
  Filled 2024-05-14 (×2): qty 1

## 2024-05-14 MED ORDER — LOPERAMIDE HCL 2 MG PO CAPS
4.0000 mg | ORAL_CAPSULE | Freq: Four times a day (QID) | ORAL | Status: DC | PRN
  Administered 2024-05-14: 4 mg via ORAL
  Filled 2024-05-14: qty 2

## 2024-05-14 NOTE — Progress Notes (Signed)
 " PROGRESS NOTE    Tim [name]  FMW:[id number] DOB: 1955/08/12 DOA: 05/12/2024 PCP: Jolee Madelin Patch, MD    Brief Narrative:  69 year old with hypertension, type 2 diabetes, chronic respiratory failure on 3 L of home oxygen , sleep apnea on nocturnal BiPAP, chronic combined heart failure, COPD and obesity admitted to the hospital with 2 to 3 weeks of abnormal gait, unsteadiness.  Drinks about 12 bottles of 12 ounce beer daily and also drinks around.  Drinks 2-3 bottles of water  daily. Hemodynamically stable in the ER.  95% on 3 L oxygen .  Sodium 117 with chloride of 81.  Kidney functions normal.  UA normal.  Urine osmolality 228, serum osmolarity 257, urine sodium less than 30.  Chest x-ray with cardiomegaly.  CT head with focal encephalomalacia superior left frontal lobe comparable to recent MRI. Admitted to stepdown unit due to low sodium, hypervolemia.  Subjective: Patient seen and examined.  Patient tells me he feels fine but he has not walked yet.  Patient feels wobbly walking.  He was wondering whether he has recurrence of his CNS tumor.  He has follow-up with Dr. Buckley tomorrow. Wife on the phone. We discussed about poor nutrition, excessive beer and water  intake and patient and wife agree that a lot of his problems could be related to drinking. Sodium is improving. Patient can transfer to progressive bed, will continue to work on diuresis and symptom control.  PT OT.  Assessment & Plan:   Severe hyponatremia, hypervolemic hypovolemia. Acute on chronic combined systolic and diastolic heart failure: Combination of congestive heart failure and also with beer [place] mania.  No acute neurological deficit to use hypertonic saline. Serum osmolality, urine  osmolality indicates hypervolemia. Water  restriction Salt tablets 1 g 2 times daily Sodium is appropriately improving [id number].  Will avoid sudden correction. Will resume diuresis today.  Reduce dose of sodium chloride  to  avoid rapid correction. Continue Entresto , Toprol  XL. Resume home dose of Aldactone  and Jardiance . Lasix  40 mg IV twice daily today. BMP every 12 hours.  Alcohol abuse with risk of alcohol withdrawal: CIWA protocol benzodiazepine based.  Multivitamins.  Close monitoring for alcohol withdrawal syndrome.  So far uncomplicated.  OSA on BiPAP: Using BiPAP at night.  COPD optimized on treatment.  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  CT scan comparable to previous MRI.  COPD and chronic hypoxemic failure: On 3 L oxygen .  At about baseline.  Type 2 diabetes: On insulin  at home.  Resumed.  Start PT OT.  DVT prophylaxis: heparin  injection 5,000 Units Start: 05/13/24 2200 SCDs Start: 05/13/24 1832   Code Status: DNR/DNI Family Communication: Wife on the phone Disposition Plan: Status is: Inpatient Remains inpatient appropriate because: Severe significant disease, transferred to progressive care unit     Consultants:  None  Procedures:  None  Antimicrobials:  None     Objective: Vitals:   05/14/24 0600 05/14/24 0700 05/14/24 0854 05/14/24 0855  BP: (!) 149/55 (!) 155/68    Pulse: (!) 57 (!) 52    Resp: 19 19    Temp:      TempSrc:      SpO2: 96% 95% 94% 94%  Weight:      Height:        Intake/Output Summary (Last 24 hours) at 05/14/2024 0908 Last data filed at 05/14/2024 0100 Gross per 24 hour  Intake 1000 ml  Output 2950 ml  Net -1950 ml   Filed Weights   05/12/24 2315 05/13/24 1859  Weight: 136.1  kg 133.8 kg    Examination:  General exam: Appears calm and comfortable  Able to talk in complete sentences.  Alert awake and oriented.  Gross generalized weakness. Frail.  Older than his stated age. Respiratory system: Clear to auscultation.  No added sounds.  On 2 L oxygen . Cardiovascular system: S1 & S2 heard, RRR. No JVD, murmurs, rubs, gallops or clicks.  Trace bilateral pedal edema. Gastrointestinal system: Abdomen is nondistended, soft and  nontender. No organomegaly or masses felt. Normal bowel sounds heard. Central nervous system: Alert and oriented. No focal neurological deficits.  Gross generalized weakness.  No tremors or asterixis.    Data Reviewed: I have personally reviewed following labs and imaging studies  CBC: Recent Labs  Lab 05/12/24 2318 05/13/24 0400  WBC 7.7 7.2  NEUTROABS 5.7  --   HGB 14.5 13.3  HCT 40.7 36.8*  MCV 88.5 88.9  PLT 137* 133*   Basic Metabolic Panel: Recent Labs  Lab 05/13/24 0916 05/13/24 1641 05/13/24 1911 05/14/24 0035 05/14/24 0656  NA 119* 123* 123* 125* 125*  K 4.6 4.9 4.5 4.7 4.4  CL 83* 84* 86* 89* 89*  CO2 23 23 26 26 25   GLUCOSE 111* 137* 132* 112* 100*  BUN 11 12 11 11 11   CREATININE 0.69 0.68 0.78 0.73 0.71  CALCIUM  9.3 8.8* 9.0 9.0 9.1   GFR: Estimated Creatinine Clearance: 132.1 mL/min (by C-G formula based on SCr of 0.71 mg/dL). Liver Function Tests: Recent Labs  Lab 05/12/24 2318  AST 74*  ALT 71*  ALKPHOS 134*  BILITOT 1.4*  PROT 7.5  ALBUMIN 4.2   No results for input(s): LIPASE, AMYLASE in the last 168 hours. No results for input(s): AMMONIA in the last 168 hours. Coagulation Profile: No results for input(s): INR, PROTIME in the last 168 hours. Cardiac Enzymes: No results for input(s): CKTOTAL, CKMB, CKMBINDEX, TROPONINI in the last 168 hours. BNP (last 3 results) No results for input(s): PROBNP in the last 8760 hours. HbA1C: Recent Labs    05/12/24 0230  HGBA1C 5.8*   CBG: Recent Labs  Lab 05/13/24 0807 05/13/24 1152 05/13/24 1646 05/13/24 1858 05/13/24 2129  GLUCAP 91 120* 163* 141* 120*   Lipid Profile: Recent Labs    05/13/24 1641  CHOL 130  HDL 87  LDLCALC 30  TRIG 61  CHOLHDL 1.5   Thyroid Function Tests: Recent Labs    05/13/24 1641  TSH 2.470  FREET4 1.57   Anemia Panel: No results for input(s): VITAMINB12, FOLATE, FERRITIN, TIBC, IRON, RETICCTPCT in the last 72  hours. Sepsis Labs: No results for input(s): PROCALCITON, LATICACIDVEN in the last 168 hours.  Recent Results (from the past 240 hours)  MRSA Next Gen by PCR, Nasal     Status: None   Collection Time: 05/13/24  6:53 PM   Specimen: Nasal Mucosa; Nasal Swab  Result Value Ref Range Status   MRSA by PCR Next Gen NOT DETECTED NOT DETECTED Final    Comment: (NOTE) The GeneXpert MRSA Assay (FDA approved for NASAL specimens only), is one component of a comprehensive MRSA colonization surveillance program. It is not intended to diagnose MRSA infection nor to guide or monitor treatment for MRSA infections. Test performance is not FDA approved in patients less than 7 years old. Performed at [facility], [address] [place], [place] [address]          Radiology Studies: US  Abdomen Limited RUQ (LIVER/GB) Result Date: 05/13/2024 EXAM: Right Upper Quadrant Abdominal Ultrasound  05/13/2024 09:35:00 AM TECHNIQUE: Real-time ultrasonography of the right upper quadrant of the abdomen was performed. COMPARISON: None available. CLINICAL HISTORY: Transaminitis. FINDINGS: LIVER: Hepatic steatosis. No intrahepatic biliary ductal dilatation. No evidence of mass. Patent portal vein with antegrade flow. Evaluation of the liver is limited due to bowel gas and body habitus. BILIARY SYSTEM: No pericholecystic fluid or wall thickening. No cholelithiasis. Common bile duct is within normal limits measuring 3 mm. Evaluation of the gallbladder is limited due to bowel gas and body habitus. Patient unable to hold breath during scan. OTHER: No right upper quadrant ascites. IMPRESSION: 1. Hepatic steatosis. Electronically signed by: Norman Gatlin MD 05/13/2024 09:49 AM EST RP Workstation: [id number]   DG Chest Portable 1 View Result Date: 05/13/2024 EXAM: 1 VIEW(S) XRAY OF THE CHEST 05/13/2024 12:40:15 AM COMPARISON: 02/13/21 CLINICAL HISTORY: hyponatremia, increasing fatigue FINDINGS: LUNGS AND  PLEURA: No focal pulmonary opacity. No pleural effusion. No pneumothorax. HEART AND MEDIASTINUM: Cardiomegaly. Central vascular prominence. Atherosclerotic plaque. BONES AND SOFT TISSUES: No acute osseous abnormality. IMPRESSION: 1. Cardiomegaly and central vascular prominence. Electronically signed by: Franky Stanford MD 05/13/2024 12:48 AM EST RP Workstation: [id number]   CT Head Wo Contrast Result Date: 05/13/2024 EXAM: CT HEAD WITHOUT 05/13/2024 12:41:31 AM TECHNIQUE: CT of the head was performed without the administration of intravenous contrast. Automated exposure control, iterative reconstruction, and/or weight based adjustment of the mA/kV was utilized to reduce the radiation dose to as low as reasonably achievable. COMPARISON: Brain MRI 07/08/2023. CLINICAL HISTORY: Hyponatremia, history of brain cancer. FINDINGS: BRAIN AND VENTRICLES: No acute intracranial hemorrhage. No mass effect or midline shift. No extra-axial fluid collection. No evidence of acute infarct. No hydrocephalus. Focal encephalomalacia in the superior left frontal lobe. ORBITS: No acute abnormality. SINUSES AND MASTOIDS: No acute abnormality. SOFT TISSUES AND SKULL: No acute skull fracture. No acute soft tissue abnormality. IMPRESSION: 1. No acute findings. 2. Focal encephalomalacia in the superior left frontal lobe, unchanged from prior brain MRI. Electronically signed by: Franky Stanford MD 05/13/2024 12:44 AM EST RP Workstation: [id number]        Scheduled Meds:  allopurinol   300 mg Oral Daily   arformoterol   15 mcg Nebulization BID   And   umeclidinium bromide   1 puff Inhalation Daily   Chlorhexidine  Gluconate Cloth  6 each Topical Daily   empagliflozin   10 mg Oral Daily   folic acid   1 mg Oral Daily   furosemide   40 mg Intravenous BID   heparin   5,000 Units Subcutaneous Q8H   insulin  aspart  0-5 Units Subcutaneous QHS   insulin  aspart  0-6 Units Subcutaneous TID WC   insulin  glargine  15 Units Subcutaneous Daily    metoprolol  succinate  50 mg Oral Daily   multivitamin with minerals  1 tablet Oral Daily   sacubitril -valsartan   1 tablet Oral BID   sodium chloride   1 g Oral BID WC   spironolactone   12.5 mg Oral Daily   tamsulosin   0.4 mg Oral Daily   thiamine   100 mg Oral Daily   Or   thiamine   100 mg Intravenous Daily   Continuous Infusions:   LOS: 1 day    Time spent: 55 minutes    Renato Applebaum, MD [facility]   "

## 2024-05-14 NOTE — Evaluation (Signed)
 Physical Therapy Evaluation Patient Details Name: Tim [name] MRN: [id number] DOB: [birth year] Today's Date: 05/14/2024  History of Present Illness  69 year old admitted with hyponatremia pt with c/o  2 to 3 weeks of abnormal gait, unsteadiness. Drinks about 12 bottles of 12 ounce beer daily and also drinks around. Drinks 2-3 bottles of water  daily.hypertension, type 2 diabetes, chronic respiratory failure on 3 L of home oxygen , sleep apnea on nocturnal BiPAP, chronic combined heart failure, COPD and obesity  Clinical Impression  Patient evaluated by Physical Therapy with no further acute PT needs identified. All education has been completed and the patient has no further questions.  Pt agreeable to PT. Amb 30' x2 with supervision, SpO2=89% on RA after activity, pt recovers in <one minute to >90% on RA. Seated therapeutic rest needed between bouts of 30'. Pt reports dyspnea with minimal activity is his baseline, reports amb to be at baseline. No f/u PT indicated at this time  See below for any follow-up Physical Therapy or equipment needs. PT is signing off. Thank you for this referral.         If plan is discharge home, recommend the following: Help with stairs or ramp for entrance;Assist for transportation;Assistance with cooking/housework   Can travel by private vehicle        Equipment Recommendations None recommended by PT  Recommendations for Other Services       Functional Status Assessment Patient has had a recent decline in their functional status and demonstrates the ability to make significant improvements in function in a reasonable and predictable amount of time.     Precautions / Restrictions Precautions Precautions: Fall Restrictions Weight Bearing Restrictions Per Provider Order: No      Mobility  Bed Mobility               General bed mobility comments: pt in reclienr and returned to same    Transfers Overall transfer level: Needs assistance    Transfers: Sit to/from Stand Sit to Stand: Supervision           General transfer comment: for safety    Ambulation/Gait Ambulation/Gait assistance: Supervision Gait Distance (Feet): 30 Feet (x2) Assistive device: None Gait Pattern/deviations: Wide base of support, Decreased step length - right, Decreased step length - left, Trunk flexed       General Gait Details: supervision for safety, 3/4 DOE, SpO2=89% on RA after each bout of 30'. seated therapeutic rest, pt recovers to >90% in less than one minute even when talking.  Stairs            Wheelchair Mobility     Tilt Bed    Modified Rankin (Stroke Patients Only)       Balance Overall balance assessment: History of Falls, Needs assistance Sitting-balance support: Feet supported, No upper extremity supported Sitting balance-Leahy Scale: Good       Standing balance-Leahy Scale: Fair Standing balance comment: unable to tolerate challenges             High level balance activites: Turns, Direction changes, Head turns High Level Balance Comments: no overt LOB             Pertinent Vitals/Pain Pain Assessment Pain Assessment: No/denies pain    Home Living Family/patient expects to be discharged to:: Private residence Living Arrangements: Spouse/significant other Available Help at Discharge: Family Type of Home: House Home Access: Stairs to enter Entrance Stairs-Rails: Right Entrance Stairs-Number of Steps: 2   Home Layout: One level Home Equipment: Agricultural Consultant (  2 wheels);Cane - single point;Grab bars - toilet Additional Comments: 1 recent fall just prior to this adm    Prior Function Prior Level of Function : Independent/Modified Independent;Driving                     Extremity/Trunk Assessment   Upper Extremity Assessment Upper Extremity Assessment: Defer to OT evaluation    Lower Extremity Assessment Lower Extremity Assessment: RLE deficits/detail;LLE  deficits/detail RLE Coordination: decreased gross motor LLE Coordination: decreased gross motor       Communication   Communication Communication: No apparent difficulties    Cognition Arousal: Alert Behavior During Therapy: WFL for tasks assessed/performed   PT - Cognitive impairments: No apparent impairments                       PT - Cognition Comments: tangential at times/hyper verbose Following commands: Intact       Cueing Cueing Techniques: Verbal cues     General Comments      Exercises     Assessment/Plan    PT Assessment Patient does not need any further PT services  PT Problem List         PT Treatment Interventions      PT Goals (Current goals can be found in the Care Plan section)  Acute Rehab PT Goals Patient Stated Goal: to go home tomorow PT Goal Formulation: All assessment and education complete, DC therapy    Frequency       Co-evaluation               AM-PAC PT 6 Clicks Mobility  Outcome Measure Help needed turning from your back to your side while in a flat bed without using bedrails?: None Help needed moving from lying on your back to sitting on the side of a flat bed without using bedrails?: None Help needed moving to and from a bed to a chair (including a wheelchair)?: None Help needed standing up from a chair using your arms (e.g., wheelchair or bedside chair)?: None Help needed to walk in hospital room?: None Help needed climbing 3-5 steps with a railing? : A Little 6 Click Score: 23    End of Session   Activity Tolerance: Patient tolerated treatment well;Patient limited by fatigue Patient left: with call bell/phone within reach;in chair;with chair alarm set Nurse Communication: Mobility status PT Visit Diagnosis: Other abnormalities of gait and mobility (R26.89)    Time: 8345-8290 PT Time Calculation (min) (ACUTE ONLY): 15 min   Charges:   PT Evaluation $PT Eval Low Complexity: 1 Low   PT General  Charges $$ ACUTE PT VISIT: 1 Visit         Savannha Welle, PT  Acute Rehab Dept [facility]) [phone]  05/14/2024   [facility] 05/14/2024, 5:14 PM

## 2024-05-14 NOTE — Plan of Care (Signed)
" °  Problem: Education: Goal: Individualized Educational Video(s) 05/14/2024 0556 by Cisco Nelwyn LABOR, RN Outcome: Progressing 05/14/2024 0556 by Cisco Nelwyn LABOR, RN Outcome: Progressing   Problem: Health Behavior/Discharge Planning: Goal: Ability to manage health-related needs will improve 05/14/2024 0556 by Cisco Nelwyn LABOR, RN Outcome: Progressing 05/14/2024 0556 by Cisco Nelwyn LABOR, RN Outcome: Progressing   Problem: Nutritional: Goal: Maintenance of adequate nutrition will improve 05/14/2024 0556 by Cisco Nelwyn LABOR, RN Outcome: Progressing 05/14/2024 0556 by Cisco Nelwyn LABOR, RN Outcome: Progressing   "

## 2024-05-15 ENCOUNTER — Other Ambulatory Visit (HOSPITAL_COMMUNITY): Payer: Self-pay

## 2024-05-15 ENCOUNTER — Inpatient Hospital Stay: Admitting: Internal Medicine

## 2024-05-15 LAB — GLUCOSE, CAPILLARY: Glucose-Capillary: 110 mg/dL — ABNORMAL HIGH (ref 70–99)

## 2024-05-15 LAB — BASIC METABOLIC PANEL WITH GFR
Anion gap: 12 (ref 5–15)
BUN: 16 mg/dL (ref 8–23)
CO2: 26 mmol/L (ref 22–32)
Calcium: 8.9 mg/dL (ref 8.9–10.3)
Chloride: 88 mmol/L — ABNORMAL LOW (ref 98–111)
Creatinine, Ser: 0.83 mg/dL (ref 0.61–1.24)
GFR, Estimated: >60 mL/min
Glucose, Bld: 104 mg/dL — ABNORMAL HIGH (ref 70–99)
Potassium: 4 mmol/L (ref 3.5–5.1)
Sodium: 127 mmol/L — ABNORMAL LOW (ref 135–145)

## 2024-05-15 MED ORDER — FUROSEMIDE 20 MG PO TABS: 20.0000 mg | ORAL_TABLET | Freq: Every day | ORAL | Status: AC

## 2024-05-15 MED ORDER — SODIUM CHLORIDE 1 G PO TABS
1.0000 g | ORAL_TABLET | Freq: Two times a day (BID) | ORAL | 0 refills | Status: AC
  Filled 2024-05-15 – 2024-05-17 (×2): qty 60, 30d supply, fill #0

## 2024-05-15 MED ORDER — CEPHALEXIN 500 MG PO CAPS: 500.0000 mg | ORAL_CAPSULE | Freq: Three times a day (TID) | ORAL | Status: AC

## 2024-05-15 MED ORDER — ALBUTEROL SULFATE (2.5 MG/3ML) 0.083% IN NEBU
2.5000 mg | INHALATION_SOLUTION | RESPIRATORY_TRACT | 2 refills | Status: AC | PRN
  Filled 2024-05-15 – 2024-05-17 (×2): qty 75, 5d supply, fill #0

## 2024-05-15 NOTE — Discharge Summary (Signed)
 Physician Discharge Summary  REMINGTON HIGHBAUGH FMW:[id number] DOB: Apr 29, 1956 DOA: 05/12/2024  PCP: Jolee Madelin Patch, MD  Admit date: 05/12/2024 Discharge date: 05/15/2024  Admitted From: Home  Disposition: Home  Recommendations for Outpatient Follow-up:  Follow up with PCP in 1-2 weeks Please obtain BMP/CBC in one week Follow-up with cardiology as scheduled  Home Health: N/A Equipment/Devices: Available at home  Discharge Condition: Stable CODE STATUS: DNR/DNI Diet recommendation: Low-salt and low-carb diet  Discharge summary: 69 year old with hypertension, type 2 diabetes, chronic respiratory failure on 3 L of home oxygen , sleep apnea on nocturnal BiPAP, chronic combined heart failure, COPD and obesity admitted to the hospital with 2 to 3 weeks of abnormal gait, unsteadiness.  Drinks about 12 bottles of 12 ounce beer daily and also drinks around 2-3 bottles of water  daily. Hemodynamically stable in the ER.  95% on 3 L oxygen .  Sodium 117 with chloride of 81.  Kidney functions normal.  UA normal.  Urine osmolality 228, serum osmolarity 257, urine sodium less than 30.  Chest x-ray with cardiomegaly.  CT head with focal encephalomalacia superior left frontal lobe comparable to recent MRI. Admitted to stepdown unit due to low sodium, hypervolemia.  Treated symptomatically with adequate clinical improvement.  Going home today.  Will need very close outpatient follow-up.   # Severe hyponatremia, hypervolemic hypovolemia. Acute on chronic combined systolic and diastolic heart failure:  Combination of congestive heart failure and also with beer Poto mania.  No acute neurological deficit but wobbly gait that is likely combination of factors.  Serum osmolality, urine  osmolality indicates hypervolemia. Patient was treated with water  restriction Sodium tablets 1 g twice daily Sodium levels appropriately improving [id number].   Continue Entresto , Toprol  XL. Resume home dose of  Aldactone  and Jardiance . Responded well to IV Lasix . Will add Lasix  20 mg daily. Patient will need renal function monitoring, sodium level monitoring in 1 to 2 weeks.   Alcohol abuse with risk of alcohol withdrawal: Remained uncomplicated without any evidence of alcohol withdrawal syndrome.  Extensive counseling given.   OSA on BiPAP: Using BiPAP at night.  COPD optimized on treatment.   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  CT scan comparable to previous MRI.  Patient is scheduled to follow-up with oncology today.   COPD and chronic hypoxemic failure: On 3 L oxygen .  At about baseline.   Type 2 diabetes: On insulin  at home.  Resumed.   Lower extremity ulcerations: Patient reported rug burn both legs with some redness.  Does have adequate peripheral pulses.  He was prescribed Keflex  by his primary care physician that he will continue for 7 days.  Patient is medically stabilizing.  His sodium is 127 otherwise fairly stable.  Patient wanted to be discharged so that they can make it to the oncology appointment today and they did not want to miss it.  Discharged with advice for close follow-up.   Discharge Diagnoses:  Principal Problem:   Hyponatremia Active Problems:   ETOH abuse   Essential hypertension   COPD  GOLD III    Chronic respiratory failure with hypoxia (HCC) - 3 L/min   Primary CNS lymphoma (HCC)   Chronic combined systolic and diastolic heart failure (HCC)   Obesity, Class II, BMI 35-39.9   DNR (do not resuscitate)/DNI(Do Not Intubate)   Obstructive sleep apnea treated with BiPAP    Discharge Instructions  Discharge Instructions     Diet - low sodium heart healthy   Complete by: As directed  Diet Carb Modified   Complete by: As directed    For home use only DME Nebulizer machine   Complete by: As directed    Patient needs a nebulizer to treat with the following condition: COPD (chronic obstructive pulmonary disease) (HCC)   Length of Need:  Lifetime   Additional equipment included: Administration kit   Increase activity slowly   Complete by: As directed    No wound care   Complete by: As directed       Allergies as of 05/15/2024   No Known Allergies      Medication List     TAKE these medications    albuterol  (2.5 MG/3ML) 0.083% nebulizer solution Commonly known as: PROVENTIL  Inhale 3 mLs (2.5 mg total) by nebulization every 4 (four) hours as needed for wheezing or shortness of breath.   allopurinol  300 MG tablet Commonly known as: ZYLOPRIM  Take 1 tablet (300 mg total) by mouth daily.   b complex vitamins tablet Take 1 tablet by mouth every evening.   blood glucose meter kit and supplies Kit Dispense based on patient and insurance preference. Use up to four times daily as directed. (FOR ICD-9 250.00, 250.01).   celecoxib 200 MG capsule Commonly known as: CELEBREX Take 200 mg by mouth daily as needed.   cephALEXin  500 MG capsule Commonly known as: KEFLEX  Take 1 capsule (500 mg total) by mouth every 8 (eight) hours for 7 days.   diclofenac  Sodium 1 % Gel Commonly known as: VOLTAREN  Apply 4 g topically 2 (two) times a day. Apply to knees as needed.   Entresto  49-51 MG Generic drug: sacubitril -valsartan  Take 1 tablet by mouth 2 (two) times daily.   furosemide  20 MG tablet Commonly known as: Lasix  Take 1 tablet (20 mg total) by mouth daily.   insulin  isophane & regular human KwikPen (70-30) 100 UNIT/ML KwikPen Commonly known as: NovoLIN 70/30 Kwikpen Inject 15 Units into the skin 2 (two) times daily.   Jardiance  10 MG Tabs tablet Generic drug: empagliflozin  Take 1 tablet (10 mg total) by mouth daily before breakfast What changed: Another medication with the same name was removed. Continue taking this medication, and follow the directions you see here.   metoprolol  succinate 50 MG 24 hr tablet Commonly known as: TOPROL -XL Take 1 tablet (50 mg total) by mouth daily with or immediately following a  meal.   multivitamin tablet Take 1 tablet by mouth daily.   OneTouch Verio test strip Generic drug: glucose blood 1 each daily.   Precision QID Test test strip Generic drug: glucose blood Use as instructed   OneTouch Verio test strip Generic drug: glucose blood Use to test fasting blood sugar daily.   Pen Needles 3/16 31G X 5 MM Misc 1 Container by Does not apply route 2 (two) times daily.   rosuvastatin  5 MG tablet Commonly known as: CRESTOR  Take 1 tablet (5 mg total) by mouth daily. What changed: Another medication with the same name was removed. Continue taking this medication, and follow the directions you see here.   sodium chloride  1 g tablet Take 1 tablet (1 g total) by mouth 2 (two) times daily with a meal.   spironolactone  25 MG tablet Commonly known as: Aldactone  Take 0.5 tablets (12.5 mg total) by mouth daily.   Stiolto Respimat  2.5-2.5 MCG/ACT Aers Generic drug: Tiotropium Bromide-Olodaterol Inhale 2 puffs into the lungs daily.   tamsulosin  0.4 MG Caps capsule Commonly known as: FLOMAX  Take 1 capsule (0.4 mg total) by mouth daily  after dinner.               Durable Medical Equipment  (From admission, onward)           Start     Ordered   05/15/24 0000  For home use only DME Nebulizer machine       Question Answer Comment  Patient needs a nebulizer to treat with the following condition COPD (chronic obstructive pulmonary disease) (HCC)   Length of Need Lifetime   Additional equipment included Administration kit      05/15/24 0820            Allergies[1]  Consultations: None   Procedures/Studies: US  Abdomen Limited RUQ (LIVER/GB) Result Date: 05/13/2024 EXAM: Right Upper Quadrant Abdominal Ultrasound 05/13/2024 09:35:00 AM TECHNIQUE: Real-time ultrasonography of the right upper quadrant of the abdomen was performed. COMPARISON: None available. CLINICAL HISTORY: Transaminitis. FINDINGS: LIVER: Hepatic steatosis. No intrahepatic  biliary ductal dilatation. No evidence of mass. Patent portal vein with antegrade flow. Evaluation of the liver is limited due to bowel gas and body habitus. BILIARY SYSTEM: No pericholecystic fluid or wall thickening. No cholelithiasis. Common bile duct is within normal limits measuring 3 mm. Evaluation of the gallbladder is limited due to bowel gas and body habitus. Patient unable to hold breath during scan. OTHER: No right upper quadrant ascites. IMPRESSION: 1. Hepatic steatosis. Electronically signed by: Norman Gatlin MD 05/13/2024 09:49 AM EST RP Workstation: [id number]   DG Chest Portable 1 View Result Date: 05/13/2024 EXAM: 1 VIEW(S) XRAY OF THE CHEST 05/13/2024 12:40:15 AM COMPARISON: 02/13/21 CLINICAL HISTORY: hyponatremia, increasing fatigue FINDINGS: LUNGS AND PLEURA: No focal pulmonary opacity. No pleural effusion. No pneumothorax. HEART AND MEDIASTINUM: Cardiomegaly. Central vascular prominence. Atherosclerotic plaque. BONES AND SOFT TISSUES: No acute osseous abnormality. IMPRESSION: 1. Cardiomegaly and central vascular prominence. Electronically signed by: Franky Stanford MD 05/13/2024 12:48 AM EST RP Workstation: [id number]   CT Head Wo Contrast Result Date: 05/13/2024 EXAM: CT HEAD WITHOUT 05/13/2024 12:41:31 AM TECHNIQUE: CT of the head was performed without the administration of intravenous contrast. Automated exposure control, iterative reconstruction, and/or weight based adjustment of the mA/kV was utilized to reduce the radiation dose to as low as reasonably achievable. COMPARISON: Brain MRI 07/08/2023. CLINICAL HISTORY: Hyponatremia, history of brain cancer. FINDINGS: BRAIN AND VENTRICLES: No acute intracranial hemorrhage. No mass effect or midline shift. No extra-axial fluid collection. No evidence of acute infarct. No hydrocephalus. Focal encephalomalacia in the superior left frontal lobe. ORBITS: No acute abnormality. SINUSES AND MASTOIDS: No acute abnormality. SOFT TISSUES AND SKULL: No  acute skull fracture. No acute soft tissue abnormality. IMPRESSION: 1. No acute findings. 2. Focal encephalomalacia in the superior left frontal lobe, unchanged from prior brain MRI. Electronically signed by: Franky Stanford MD 05/13/2024 12:44 AM EST RP Workstation: [id number]   (Echo, Carotid, EGD, Colonoscopy, ERCP)    Subjective: Patient seen and examined.  Wife on the phone.  Patient tells me that he feels much better he was able to mobilize around.  Matter-of-fact he walked around without any gait disturbances.  He does have some cough but this is his usual.  Wants to be discharged early hours so he can make it to his cancer doctor's appointment.   Discharge Exam: Vitals:   05/15/24 0804 05/15/24 0806  BP:    Pulse:    Resp:    Temp:    SpO2: 94% 94%   Vitals:   05/15/24 0309 05/15/24 0429 05/15/24 0804 05/15/24 0806  BP:  (!) 142/58  Pulse:  (!) 59    Resp: (!) 22 17    Temp:  98.2 F (36.8 C)    TempSrc:      SpO2:  93% 94% 94%  Weight:      Height:        General: Pt is alert, awake, not in acute distress Is frail but looks fairly comfortable.  Not in any distress.  On 2 L oxygen . Cardiovascular: RRR, S1/S2 +, no rubs, no gallops Respiratory: CTA bilaterally, no wheezing, no rhonchi, on 2 L. Abdominal: Soft, NT, ND, bowel sounds + Extremities: no edema, no cyanosis    The results of significant diagnostics from this hospitalization (including imaging, microbiology, ancillary and laboratory) are listed below for reference.     Microbiology: Recent Results (from the past 240 hours)  MRSA Next Gen by PCR, Nasal     Status: None   Collection Time: 05/13/24  6:53 PM   Specimen: Nasal Mucosa; Nasal Swab  Result Value Ref Range Status   MRSA by PCR Next Gen NOT DETECTED NOT DETECTED Final    Comment: (NOTE) The GeneXpert MRSA Assay (FDA approved for NASAL specimens only), is one component of a comprehensive MRSA colonization surveillance program. It is not  intended to diagnose MRSA infection nor to guide or monitor treatment for MRSA infections. Test performance is not FDA approved in patients less than 20 years old. Performed at [facility], [address] [place], [place] [address]      Labs: BNP (last 3 results) No results for input(s): BNP in the last 8760 hours. Basic Metabolic Panel: Recent Labs  Lab 05/13/24 1911 05/14/24 0035 05/14/24 0656 05/14/24 1630 05/15/24 0509  NA 123* 125* 125* 124* 127*  K 4.5 4.7 4.4 4.4 4.0  CL 86* 89* 89* 86* 88*  CO2 26 26 25 27 26   GLUCOSE 132* 112* 100* 168* 104*  BUN 11 11 11 13 16   CREATININE 0.78 0.73 0.71 0.87 0.83  CALCIUM  9.0 9.0 9.1 9.2 8.9   Liver Function Tests: Recent Labs  Lab 05/12/24 2318  AST 74*  ALT 71*  ALKPHOS 134*  BILITOT 1.4*  PROT 7.5  ALBUMIN 4.2   No results for input(s): LIPASE, AMYLASE in the last 168 hours. No results for input(s): AMMONIA in the last 168 hours. CBC: Recent Labs  Lab 05/12/24 2318 05/13/24 0400  WBC 7.7 7.2  NEUTROABS 5.7  --   HGB 14.5 13.3  HCT 40.7 36.8*  MCV 88.5 88.9  PLT 137* 133*   Cardiac Enzymes: No results for input(s): CKTOTAL, CKMB, CKMBINDEX, TROPONINI in the last 168 hours. BNP: Invalid input(s): POCBNP CBG: Recent Labs  Lab 05/13/24 2129 05/14/24 1216 05/14/24 1701 05/14/24 2002 05/15/24 0716  GLUCAP 120* 123* 154* 157* 110*   D-Dimer No results for input(s): DDIMER in the last 72 hours. Hgb A1c No results for input(s): HGBA1C in the last 72 hours. Lipid Profile Recent Labs    05/13/24 1641  CHOL 130  HDL 87  LDLCALC 30  TRIG 61  CHOLHDL 1.5   Thyroid function studies Recent Labs    05/13/24 1641  TSH 2.470   Anemia work up No results for input(s): VITAMINB12, FOLATE, FERRITIN, TIBC, IRON, RETICCTPCT in the last 72 hours. Urinalysis    Component Value Date/Time   COLORURINE STRAW (A) 05/12/2024 0135   APPEARANCEUR CLEAR  05/12/2024 0135   LABSPEC <1.005 (L) 05/12/2024 0135   PHURINE 5.5 05/12/2024 0135   GLUCOSEU >=500 (A) 05/12/2024  0135   HGBUR LARGE (A) 05/12/2024 0135   BILIRUBINUR NEGATIVE 05/12/2024 0135   KETONESUR 15 (A) 05/12/2024 0135   PROTEINUR NEGATIVE 05/12/2024 0135   UROBILINOGEN 0.2 03/28/2007 1459   NITRITE NEGATIVE 05/12/2024 0135   LEUKOCYTESUR NEGATIVE 05/12/2024 0135   Sepsis Labs Recent Labs  Lab 05/12/24 2318 05/13/24 0400  WBC 7.7 7.2   Microbiology Recent Results (from the past 240 hours)  MRSA Next Gen by PCR, Nasal     Status: None   Collection Time: 05/13/24  6:53 PM   Specimen: Nasal Mucosa; Nasal Swab  Result Value Ref Range Status   MRSA by PCR Next Gen NOT DETECTED NOT DETECTED Final    Comment: (NOTE) The GeneXpert MRSA Assay (FDA approved for NASAL specimens only), is one component of a comprehensive MRSA colonization surveillance program. It is not intended to diagnose MRSA infection nor to guide or monitor treatment for MRSA infections. Test performance is not FDA approved in patients less than 73 years old. Performed at [facility], [address], [place], [place] [address]      Time coordinating discharge: 40 minutes  SIGNED:   Renato Applebaum, MD  [facility] 05/15/2024, 12:41 PM     [1] No Known Allergies

## 2024-05-17 ENCOUNTER — Other Ambulatory Visit (HOSPITAL_BASED_OUTPATIENT_CLINIC_OR_DEPARTMENT_OTHER): Payer: Self-pay | Admitting: Cardiology

## 2024-05-17 ENCOUNTER — Other Ambulatory Visit (HOSPITAL_COMMUNITY): Payer: Self-pay

## 2024-05-17 ENCOUNTER — Other Ambulatory Visit: Payer: Self-pay

## 2024-05-17 MED ORDER — SPIRONOLACTONE 25 MG PO TABS
12.5000 mg | ORAL_TABLET | Freq: Every day | ORAL | 1 refills | Status: AC
  Filled 2024-05-17: qty 45, 90d supply, fill #0

## 2024-05-17 MED FILL — Rosuvastatin Calcium Tab 5 MG: ORAL | 90 days supply | Qty: 90 | Fill #1 | Status: AC

## 2024-05-19 ENCOUNTER — Telehealth: Payer: Self-pay | Admitting: *Deleted

## 2024-05-19 ENCOUNTER — Telehealth: Payer: Self-pay

## 2024-05-19 DIAGNOSIS — J449 Chronic obstructive pulmonary disease, unspecified: Secondary | ICD-10-CM

## 2024-05-19 NOTE — Telephone Encounter (Signed)
 Copied from CRM #[id number]. Topic: Clinical - Prescription Issue >> May 19, 2024 12:03 PM Isabell A wrote: Reason for CRM: Reason for CRM: Patient states he was seen in the hospital on 1/2 and was prescribed Rx #: [id number] albuterol  (PROVENTIL ) (2.5 MG/3ML) 0.083% nebulizer solution [[id number]]   - but he does not have a nebulizer, requesting a prescription for a nebulizer to be sent through [facility]. Requesting a call back to speak with Dr.Byrums' nurse.  Callback number: [phone]   ATC x1. LMTCB. Pt will need an office visit to have a neb machine order placed.  Pt can also buy one out of pocket without needing an office visit.

## 2024-05-19 NOTE — Telephone Encounter (Signed)
 PC to patient to F/U on his request to see Dr Buckley due to having symptoms that he experienced when he was diagnosed with CNS lymphoma.  Informed patient Dr Buckley wants a MRI prior to an appointment.  Patient states he was recently hospitalized with hyponatremia & since that has resolved, he feels much better & is no longer having symptoms.  Dr Buckley informed, patient does not need to come in unless symptoms return.  Patient informed, he verbalizes understanding.

## 2024-05-22 LAB — GLUCOSE, CAPILLARY: Glucose-Capillary: 110 mg/dL — ABNORMAL HIGH (ref 70–99)

## 2024-05-23 NOTE — Telephone Encounter (Signed)
 Copied from CRM [id number]. Topic: Clinical - Medical Advice >> May 19, 2024 12:34 PM Devaughn RAMAN wrote: Reason for CRM: Pt would like to know if he purchases the nebulizer out of pocker what brand, type, etc should he purchase, pt would like a callback regarding this.  --------------------------------------------------------------------------------------------------------------------------------------  I called and spoke with the patient, he states he has purchased an inexpensive portable nebulizer machine from [facility], he is interested in getting one through a DME company.  I explained that we may get push back since he has not been seen recently however we could try sending an order for a nebulizer machine and see what happens.  He agreed that if there is push back regarding needing a OV for the nebulizer machine, he will just wait until his 2/4 appointment to get one.  Dr. Annella,  Please advise if ok to place an order for a nebulizer machine?  Thank you.

## 2024-05-23 NOTE — Telephone Encounter (Signed)
 Ok for new order - thanks!

## 2024-05-25 ENCOUNTER — Telehealth: Payer: Self-pay | Admitting: Primary Care

## 2024-05-25 ENCOUNTER — Telehealth: Payer: Self-pay | Admitting: Pulmonary Disease

## 2024-05-25 NOTE — Telephone Encounter (Signed)
 Patient aware

## 2024-05-25 NOTE — Telephone Encounter (Signed)
 Please sign this patient's DME order

## 2024-05-25 NOTE — Telephone Encounter (Signed)
 Will you sign this patient's DME order?

## 2024-05-25 NOTE — Progress Notes (Signed)
 " [address] - AMBULATORY [facility]  - FAMILY MEDICINE [facility] [address] [place] [address]    Date of service:  05/26/2024  Name:  Tim [name]  Date of Birth:  11/18/1955     SUBJECTIVE   Patient ID: Tim [name] is a 69 y.o. (DOB February 11, 1956) male   Chief Complaint  Patient presents with   Hospital Follow-Up     Patient is here for [facility] & follow up of recent discharge from [facility].  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  He had already scheduled an oncology follow-up prior to seeing me, couple of days after office visit. Patient found to be hyponatremic, patient contacted by on-call NP Swanson and instructed to go to the ED ASAP. Sodium was 118.  Discharge summary and/or Transitional Care Management documentation reviewed by me.  Medication reconciliation was performed as indicated via the [facility] as Reviewed timestamp.  Home health:  No  The patient was contacted by transitional management care team on .     Severe hyponatremia: Sodium appropriately increasing, sodium 127 on discharge.  Patient did disclose heavy alcohol consumption (12 bottles of 12 ounce beers daily, he was also drinking 2-3 bottles of water  daily resulting in hypervolemic hyponatremia).  Chronic combined CHF: Acute on chronic CHF from fluid consumption, patient ate baseline on discharge. Lasix  added 20 mg daily.  Patient continued on Entresto  and Toprol -XL & patient instructed to resume home dose of Aldactone  and Jardiance .  EtOH abuse: Apparently no evidence of alcohol withdrawal during hospitalization, per discharge summary patient was given extensive counseling.  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  Lower extremity ulcerations: I prescribed Keflex  when I saw him just prior to hospitalization, he finished the 7-day course.    ROS All other pertinent systems reviewed and are negative.    Social History[1]   The following portions of the patient's history were reviewed and updated as appropriate: allergies, current medications, past family history, past medical history, past social history, past surgical history and problem list.  OBJECTIVE   Vitals:   05/26/24 1338  BP: 148/72  Pulse: 57  Temp: 97.7 F (36.5 C)  TempSrc: Temporal  SpO2: 96%  Weight: 131 kg (288 lb 2 oz)  Height: 1.93 m (6' 4)    Body mass index is 35.07 kg/m.  Constitutional: Alert - NAD. Appears well-developed and well nourished. Cardiovascular: Normal rate and rhythm.  Exam reveals no gallop and no friction rub.  No murmur heard.  Pulmonary/chest: Effort normal and breath sounds normal. No wheezes. No rales.  Extremities: Trace edema bilaterally, much better compared to last week   ASSESSMENT/PLAN   Problem List Items Addressed This Visit       Endocrine   Type 2 diabetes mellitus with diabetic polyneuropathy, with long-term current use of insulin     (CMD)   A1c at goal, 5.8 on 05/12/2024 during hospitalization. No hypoglycemia On 70/3015 units, adjust based on BG readings.        Respiratory   Stage 3 severe COPD by GOLD classification    (CMD)   With chronic respiratory failure w/ hypoxia Pulmonology following He has been stable since discharge, states that he has been feeling better Home SaO2  as high as 93% on room air On 3 L/min O2, Stiolto Respimat       Obstructive sleep apnea treated with BiPAP   Compliant.        Cardiovascular and Mediastinum   Essential hypertension   BP in desired range, continue current management.      Chronic combined systolic and diastolic heart failure    (CMD)   Clinically stable, furosemide  recently added,  will check BMP today He is currently on Entresto , Aldactone , Jardiance , metoprolol  succinate in addition to the furosemide  which was added on discharge.      Relevant Medications   furosemide  (LASIX ) 20 mg tablet   Other Relevant Orders   CBC with Differential     Other   ETOH abuse   EtOH-free during hospitalization with no signs of withdrawal.  He has not returned to drinking 12 bottles EtOH since discharge, having 1 or 2 beers in the evening.  Patient states this was a wake-up call.      Severe obesity (BMI 35.0-39.9) with comorbidity (CMS/HCC)   Other Visit Diagnoses       Hospital discharge follow-up    -  Primary     Hyponatremia       Tx w/ fluid restriction, sodium tabs.  Sodium improved on discharge, 127.   Relevant Orders   Basic Metabolic Panel     Medications prescribed or ordered upon discharge were reviewed on Fri 05/26/2024  and reconciled with the most recent outpatient medication list. I have reviewed and agree with this medication reconciliation. I personally saw the patient in a face to face visit and reviewed the hospital records (including diagnostic tests, assessments, new medications and diagnoses) and discharge plans documented by the discharging physician.   The complexity of medical decision making for this patient's transitional care is moderate.  Risks, benefits, and alternatives of the medication(s) and treatment plan(s) were discussed, and he expressed understanding. Plan follow up as discussed or as needed. No barriers to treatment identified in this visit.     Return for next scheduled office visit, and as needed.  Current Outpatient Medications  Medication Instructions   acetaminophen  (TYLENOL ) 1,000 mg, Every 8 hours PRN   allopurinoL  (ZYLOPRIM ) 300 mg, oral, Daily   diclofenac  sodium (VOLTAREN ) 4 g, topical, 2 times daily, Apply to knees PRN   furosemide  (LASIX ) 20 mg, oral, 2 times daily PRN   glucose blood (OneTouch Verio test strips)  test strip FSBS daily   glucose blood test strip Use as instructed   insulin  NPH-insulin  regular (HumuLIN 70/30 U-100 KwikPen) 100 unit/mL (70-30) pen pen 15 Units   Jardiance  10 mg, Daily before breakfast   Lancets misc FSBS daily   metoprolol  succinate (TOPROL  XL) 50 mg, Daily   MISCELLANEOUS MEDICATION/PRODUCT Portable O2 by [place]   multivitamin (THERAGRAN) tab tablet Take  by mouth.   OneTouch Verio Flex meter misc FSBS daily   pen needle, diabetic 31 gauge x 3/16 ndle 1 Container   rosuvastatin  (CRESTOR ) 5 mg, Daily   sacubitriL -valsartan  (Entresto ) 49-51 mg per tablet 1 tablet, 2 times daily   spironolactone  (ALDACTONE ) 25 mg tablet 0.5 tablets, Daily   Stiolto Respimat  2.5-2.5 mcg/actuation inhaler 2 puffs, Daily   tamsulosin  (FLOMAX ) 0.4 mg, oral, Daily after dinner   vitamin B complex-folic acid  0.4 mg tablet Take  by mouth.        I agree the documentation is accurate and complete.  Electronically signed by: Madelin Rachel Brought, MD 05/26/2024 1:50 PM          [  1] Social History Tobacco Use   Smoking status: Former    Current packs/day: 0.00    Types: Cigarettes    Start date: 05/11/1969    Quit date: 05/12/2011    Years since quitting: 13.0    Passive exposure: Never   Smokeless tobacco: Never  Vaping Use   Vaping status: Never Used  Substance Use Topics   Alcohol use: Yes   Drug use: Never  "

## 2024-05-25 NOTE — Telephone Encounter (Signed)
 I don't have any pending orders, I think its been signed

## 2024-05-28 NOTE — Progress Notes (Unsigned)
 "  MASE [name] - 69 y.o. male MRN [id number]  Date of birth: [birth year]  Office Visit Note: Visit Date: 05/29/2024 PCP: Jolee Madelin Patch, MD Referred by: Jolee Madelin Patch, MD  Subjective: No chief complaint on file.  HPI: Tim [name] is a pleasant 69 y.o. male who presents today for evaluation of bilateral hand numbness and tingling, left greater than right that is been present now for multiple years.  He states that approximate 15 years prior, he did receive a diagnosis of bilateral carpal tunnel syndrome with both clinical and electrodiagnostic evidence.  Has been managing his symptoms with bracing, particular for nocturnal symptoms which is no longer working.  Is noticing significant numbness and tingling throughout the course of the day with progressive weakness, left greater than right.  Pertinent ROS were reviewed with the patient and found to be negative unless otherwise specified above in HPI.   Visit Reason: bilateral CTS L>R Duration of symptoms: Multiple years Hand dominance: right Occupation: retired  Diabetic: Yes A1c 5.8 Smoking: No-former quit 10 years ago Heart/Lung History: Cardiovascular and Mediastinum Essential hypertension Chronic combined systolic and diastolic heart failure (HCC) Blood Thinners:  none  Prior Testing/EMG: EMG around 15 years ago  Injections (Date): none Treatments: none Prior Surgery: none    Assessment & Plan: Visit Diagnoses:  1. Carpal tunnel syndrome, left upper limb   2. Carpal tunnel syndrome, right upper limb     Plan: Extensive discussion was had with the patient today about his ongoing bilateral carpal tunnel syndrome that is refractory to conservative care.  I did not have access to his previous electrodiagnostic workup however based on his examination today, this is quite severe in nature, particularly on the left side.  We discussed the underlying etiology and pathophysiology of carpal tunnel syndrome.   Patient has all CTS 6 criteria to confirm diagnosis bilaterally.   Mo significantly, is his progressive thenar atrophy, left greater than right.  We did discuss the possibility of ongoing bracing, activity modification, cortisone injection and therapeutic exercise.  However, given the progressive nature of his symptoms over multiple years, with his clinical examination, at this juncture, he is indicated for bilateral, staged open versus endoscopic carpal tunnel release.  Risks and benefits of both operations were discussed in detail today.  He would like to begin with the left side.  Understanding all risks and benefits, patient would like to have surgery done in the form of left open carpal tunnel release under local anesthesia.  Risks include but not limited to infection, bleeding, scarring, stiffness, nerve injury or vascular, tendon injury, risk of recurrence and need for subsequent operation were all discussed in detail.  Patient consented understanding the above.  Will move forward surgical scheduling.   Follow-up: No follow-ups on file.   Meds & Orders: No orders of the defined types were placed in this encounter.  No orders of the defined types were placed in this encounter.    Procedures: No procedures performed      Clinical History: No specialty comments available.  He reports that he quit smoking about 12 years ago. His smoking use included cigarettes. He started smoking about 52 years ago. He has a 80 pack-year smoking history. He has never used smokeless tobacco.  Recent Labs    05/12/24 0230  HGBA1C 5.8*    Objective:   Vital Signs: There were no vitals taken for this visit.  Physical Exam  Gen: Well-appearing, in no acute distress; non-toxic CV: Regular  Rate. Well-perfused. Warm.  Resp: Breathing unlabored on room air; no wheezing. Psych: Fluid speech in conversation; appropriate affect; normal thought process  Ortho Exam PHYSICAL EXAM:  General: Patient is well  appearing and in no distress.   Skin and Muscle: No skin changes are apparent to upper extremities.   Range of Motion and Palpation Tests: Mobility is full about the elbows with flexion and extension.Forearm supination and pronation are 85/85 bilaterally.  Wrist flexion/extension is 75/65 bilaterally.  Digital flexion and extension are full.  Thumb opposition is to the ring finger PIP left, ring finger DPC right. Palpable palmar nodules left hand consistent with underlying Dupuytren's disease, no contracture  Moderate tenderness over the thumb CMC articulation bilateral is observed.  No evidence of radiocarpal, midcarpal or intercarpal joint instability with provocation.  Neurologic, Vascular, Motor: Sensation is diminished to light touch in the bilateral median nerve distribution.    Thenar atrophy: Positive bilateral, left greater than right Tinel sign: Positive bilateral carpal tunnel Carpal tunnel compression: Positive bilateral Phalen test: Positive bilateral  Sensory bilateral hand 2-point discrimination (thumb, index, middle): Left side indiscernible, right side 10mm  Motor bilateral hand FPL: 5/5 Index FDP: 5/5 APB: 4/5  Fingers pink and well perfused.  Capillary refill is brisk.     Lab Results  Component Value Date   HGBA1C 5.8 (H) 05/12/2024      Imaging: No results found.  Past Medical/Family/Surgical/Social History: Medications & Allergies reviewed per EMR, new medications updated. 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   Abnormal PET scan of lung 09/15/2017   Chronic respiratory failure with hypoxia (HCC) - 3 L/min 09/15/2017   COPD  GOLD III  08/25/2017   DOE (dyspnea on exertion) 08/24/2017   Essential hypertension 08/02/2017   Past Medical History:  Diagnosis Date   Arthritis    Brain tumor (HCC)    COPD (chronic obstructive pulmonary disease) (HCC)    Diabetes mellitus without complication (HCC)    patient states he was taken off meds at last appointment with PCP and is diet controlled   Dyspnea    Ejection fraction < 50% 2021   Gout    Hypertension 08/02/2017   Pneumonia    Family History  Problem Relation Age of Onset   Cancer Brother    Past Surgical History:  Procedure Laterality Date   APPLICATION OF CRANIAL NAVIGATION Left 08/30/2017   Procedure: APPLICATION OF CRANIAL NAVIGATION;  Surgeon: Unice Pac, MD;  Location: [facility] OR;  Service: Neurosurgery;  Laterality: Left;   IR IMAGING GUIDED PORT INSERTION  12/27/2017   IR REMOVAL TUN ACCESS W/ PORT W/O FL MOD SED  11/14/2019   STERIOTACTIC STIMULATOR INSERTION Left 08/30/2017   Procedure: LEFT Frontal Sterotactic Brain Biopsy with BrainLab;  Surgeon: Unice Pac, MD;  Location: [facility] OR;  Service: Neurosurgery;  Laterality: Left;   Social History   Occupational History   Occupation: retired    Associate Professor: [employer]    Comment: disability  Tobacco Use   Smoking status: Former    Current packs/day: 0.00    Average packs/day: 2.0 packs/day for 40.0 years (80.0 ttl pk-yrs)    Types: Cigarettes    Start date: 08/10/1971    Quit date: 08/10/2011    Years since quitting: 12.8   Smokeless tobacco: Never  Vaping Use   Vaping status: Former   Quit date: 05/12/2011  Substance and Sexual Activity   Alcohol use: Yes    Alcohol/week: 14.0 standard drinks of alcohol    Types: 14 Cans of beer per week   Drug use: Never   Sexual activity: Not Currently     Marx Doig Estela) Arlinda, M.D. [facility], Hand Surgery  "

## 2024-05-29 ENCOUNTER — Ambulatory Visit: Admitting: Orthopedic Surgery

## 2024-05-29 DIAGNOSIS — G5602 Carpal tunnel syndrome, left upper limb: Secondary | ICD-10-CM

## 2024-05-29 DIAGNOSIS — G5601 Carpal tunnel syndrome, right upper limb: Secondary | ICD-10-CM | POA: Diagnosis not present

## 2024-06-08 ENCOUNTER — Other Ambulatory Visit: Payer: Self-pay

## 2024-06-08 DIAGNOSIS — G5602 Carpal tunnel syndrome, left upper limb: Secondary | ICD-10-CM

## 2024-06-14 ENCOUNTER — Ambulatory Visit: Admitting: Pulmonary Disease

## 2024-06-14 ENCOUNTER — Other Ambulatory Visit: Payer: Self-pay | Admitting: Orthopedic Surgery

## 2024-06-14 MED ORDER — ACETAMINOPHEN-CODEINE 300-30 MG PO TABS: 1.0000 | ORAL_TABLET | Freq: Four times a day (QID) | ORAL | 0 refills | Status: DC | PRN

## 2024-06-15 ENCOUNTER — Other Ambulatory Visit: Payer: Self-pay | Admitting: Orthopedic Surgery

## 2024-06-15 ENCOUNTER — Telehealth: Payer: Self-pay | Admitting: Orthopedic Surgery

## 2024-06-15 MED ORDER — ACETAMINOPHEN-CODEINE 300-30 MG PO TABS: 1.0000 | ORAL_TABLET | Freq: Four times a day (QID) | ORAL | 0 refills | Status: AC | PRN

## 2024-06-15 NOTE — Telephone Encounter (Signed)
 Pt came in today and saw Dr. Erwin and he called him a prescription in for Tylenol  3 to the pharmacy in [facility], but they told him they were out of stock and that he needed to get it somewhere else. Pt said that he called [facility] on [address] and he would like to get it sent there instead. Call back number is [phone].

## 2024-06-29 ENCOUNTER — Encounter: Admitting: Rehabilitative and Restorative Service Providers"

## 2024-06-29 ENCOUNTER — Encounter: Admitting: Orthopedic Surgery

## 2024-07-21 ENCOUNTER — Ambulatory Visit: Admitting: Cardiology

## 2024-08-30 ENCOUNTER — Ambulatory Visit: Admitting: Pulmonary Disease
